# Patient Record
Sex: Male | Born: 1980 | Race: White | Hispanic: No | Marital: Married | Smoking: Former smoker
Health system: Southern US, Community
[De-identification: ages and names within clinical notes are randomized; demographics above are authoritative.]

## PROBLEM LIST (undated history)

## (undated) DIAGNOSIS — I219 Acute myocardial infarction, unspecified: Secondary | ICD-10-CM

## (undated) DIAGNOSIS — I509 Heart failure, unspecified: Secondary | ICD-10-CM

## (undated) DIAGNOSIS — J189 Pneumonia, unspecified organism: Secondary | ICD-10-CM

## (undated) DIAGNOSIS — C50919 Malignant neoplasm of unspecified site of unspecified female breast: Secondary | ICD-10-CM

---

## 1998-02-10 ENCOUNTER — Ambulatory Visit (HOSPITAL_COMMUNITY): Admission: EM | Admit: 1998-02-10 | Discharge: 1998-02-11 | Payer: Self-pay | Admitting: General Surgery

## 1998-02-10 ENCOUNTER — Ambulatory Visit (HOSPITAL_BASED_OUTPATIENT_CLINIC_OR_DEPARTMENT_OTHER): Admission: RE | Admit: 1998-02-10 | Discharge: 1998-02-10 | Payer: Self-pay | Admitting: General Surgery

## 1998-10-22 ENCOUNTER — Ambulatory Visit (HOSPITAL_COMMUNITY): Admission: RE | Admit: 1998-10-22 | Discharge: 1998-10-22 | Payer: Self-pay | Admitting: Neurosurgery

## 1998-10-22 ENCOUNTER — Encounter: Payer: Self-pay | Admitting: Neurosurgery

## 2015-03-25 ENCOUNTER — Ambulatory Visit: Payer: Self-pay | Admitting: Podiatry

## 2016-10-04 DIAGNOSIS — M25372 Other instability, left ankle: Secondary | ICD-10-CM | POA: Diagnosis not present

## 2017-03-29 DIAGNOSIS — R030 Elevated blood-pressure reading, without diagnosis of hypertension: Secondary | ICD-10-CM | POA: Diagnosis not present

## 2017-03-29 DIAGNOSIS — J029 Acute pharyngitis, unspecified: Secondary | ICD-10-CM | POA: Diagnosis not present

## 2017-03-29 DIAGNOSIS — J069 Acute upper respiratory infection, unspecified: Secondary | ICD-10-CM | POA: Diagnosis not present

## 2017-08-21 DIAGNOSIS — R05 Cough: Secondary | ICD-10-CM | POA: Diagnosis not present

## 2017-08-21 DIAGNOSIS — R6889 Other general symptoms and signs: Secondary | ICD-10-CM | POA: Diagnosis not present

## 2017-08-21 DIAGNOSIS — R51 Headache: Secondary | ICD-10-CM | POA: Diagnosis not present

## 2017-08-21 DIAGNOSIS — J329 Chronic sinusitis, unspecified: Secondary | ICD-10-CM | POA: Diagnosis not present

## 2018-01-20 DIAGNOSIS — R030 Elevated blood-pressure reading, without diagnosis of hypertension: Secondary | ICD-10-CM | POA: Diagnosis not present

## 2018-01-20 DIAGNOSIS — J020 Streptococcal pharyngitis: Secondary | ICD-10-CM | POA: Diagnosis not present

## 2018-01-21 DIAGNOSIS — F902 Attention-deficit hyperactivity disorder, combined type: Secondary | ICD-10-CM | POA: Diagnosis not present

## 2018-01-21 DIAGNOSIS — Z79891 Long term (current) use of opiate analgesic: Secondary | ICD-10-CM | POA: Diagnosis not present

## 2018-02-11 DIAGNOSIS — F902 Attention-deficit hyperactivity disorder, combined type: Secondary | ICD-10-CM | POA: Diagnosis not present

## 2019-10-29 DIAGNOSIS — F4323 Adjustment disorder with mixed anxiety and depressed mood: Secondary | ICD-10-CM | POA: Diagnosis not present

## 2019-11-02 DIAGNOSIS — F4323 Adjustment disorder with mixed anxiety and depressed mood: Secondary | ICD-10-CM | POA: Diagnosis not present

## 2019-11-11 DIAGNOSIS — F4323 Adjustment disorder with mixed anxiety and depressed mood: Secondary | ICD-10-CM | POA: Diagnosis not present

## 2019-11-24 DIAGNOSIS — F4323 Adjustment disorder with mixed anxiety and depressed mood: Secondary | ICD-10-CM | POA: Diagnosis not present

## 2019-12-09 DIAGNOSIS — F4323 Adjustment disorder with mixed anxiety and depressed mood: Secondary | ICD-10-CM | POA: Diagnosis not present

## 2019-12-22 DIAGNOSIS — F4323 Adjustment disorder with mixed anxiety and depressed mood: Secondary | ICD-10-CM | POA: Diagnosis not present

## 2019-12-23 DIAGNOSIS — S61211A Laceration without foreign body of left index finger without damage to nail, initial encounter: Secondary | ICD-10-CM | POA: Diagnosis not present

## 2019-12-30 DIAGNOSIS — F4323 Adjustment disorder with mixed anxiety and depressed mood: Secondary | ICD-10-CM | POA: Diagnosis not present

## 2020-01-06 DIAGNOSIS — F902 Attention-deficit hyperactivity disorder, combined type: Secondary | ICD-10-CM | POA: Diagnosis not present

## 2020-01-06 DIAGNOSIS — F4323 Adjustment disorder with mixed anxiety and depressed mood: Secondary | ICD-10-CM | POA: Diagnosis not present

## 2020-01-21 DIAGNOSIS — F4323 Adjustment disorder with mixed anxiety and depressed mood: Secondary | ICD-10-CM | POA: Diagnosis not present

## 2020-02-03 DIAGNOSIS — F4323 Adjustment disorder with mixed anxiety and depressed mood: Secondary | ICD-10-CM | POA: Diagnosis not present

## 2020-02-15 DIAGNOSIS — F4323 Adjustment disorder with mixed anxiety and depressed mood: Secondary | ICD-10-CM | POA: Diagnosis not present

## 2020-03-02 DIAGNOSIS — F4323 Adjustment disorder with mixed anxiety and depressed mood: Secondary | ICD-10-CM | POA: Diagnosis not present

## 2020-03-18 DIAGNOSIS — F4323 Adjustment disorder with mixed anxiety and depressed mood: Secondary | ICD-10-CM | POA: Diagnosis not present

## 2020-03-30 DIAGNOSIS — F4323 Adjustment disorder with mixed anxiety and depressed mood: Secondary | ICD-10-CM | POA: Diagnosis not present

## 2020-04-13 DIAGNOSIS — F4323 Adjustment disorder with mixed anxiety and depressed mood: Secondary | ICD-10-CM | POA: Diagnosis not present

## 2020-04-27 DIAGNOSIS — F4323 Adjustment disorder with mixed anxiety and depressed mood: Secondary | ICD-10-CM | POA: Diagnosis not present

## 2020-05-12 DIAGNOSIS — F4323 Adjustment disorder with mixed anxiety and depressed mood: Secondary | ICD-10-CM | POA: Diagnosis not present

## 2020-05-25 DIAGNOSIS — F4323 Adjustment disorder with mixed anxiety and depressed mood: Secondary | ICD-10-CM | POA: Diagnosis not present

## 2020-06-22 DIAGNOSIS — F4323 Adjustment disorder with mixed anxiety and depressed mood: Secondary | ICD-10-CM | POA: Diagnosis not present

## 2020-07-04 DIAGNOSIS — F902 Attention-deficit hyperactivity disorder, combined type: Secondary | ICD-10-CM | POA: Diagnosis not present

## 2020-07-12 DIAGNOSIS — F4323 Adjustment disorder with mixed anxiety and depressed mood: Secondary | ICD-10-CM | POA: Diagnosis not present

## 2020-07-26 DIAGNOSIS — F4323 Adjustment disorder with mixed anxiety and depressed mood: Secondary | ICD-10-CM | POA: Diagnosis not present

## 2020-08-09 DIAGNOSIS — F4323 Adjustment disorder with mixed anxiety and depressed mood: Secondary | ICD-10-CM | POA: Diagnosis not present

## 2020-08-23 DIAGNOSIS — F4323 Adjustment disorder with mixed anxiety and depressed mood: Secondary | ICD-10-CM | POA: Diagnosis not present

## 2020-09-06 DIAGNOSIS — F4323 Adjustment disorder with mixed anxiety and depressed mood: Secondary | ICD-10-CM | POA: Diagnosis not present

## 2020-09-19 DIAGNOSIS — F1020 Alcohol dependence, uncomplicated: Secondary | ICD-10-CM | POA: Diagnosis not present

## 2020-09-20 DIAGNOSIS — F4323 Adjustment disorder with mixed anxiety and depressed mood: Secondary | ICD-10-CM | POA: Diagnosis not present

## 2020-10-03 DIAGNOSIS — F1020 Alcohol dependence, uncomplicated: Secondary | ICD-10-CM | POA: Diagnosis not present

## 2020-10-04 DIAGNOSIS — F4323 Adjustment disorder with mixed anxiety and depressed mood: Secondary | ICD-10-CM | POA: Diagnosis not present

## 2020-10-04 DIAGNOSIS — F1020 Alcohol dependence, uncomplicated: Secondary | ICD-10-CM | POA: Diagnosis not present

## 2020-10-05 DIAGNOSIS — F1020 Alcohol dependence, uncomplicated: Secondary | ICD-10-CM | POA: Diagnosis not present

## 2020-10-07 DIAGNOSIS — F1020 Alcohol dependence, uncomplicated: Secondary | ICD-10-CM | POA: Diagnosis not present

## 2020-10-10 DIAGNOSIS — F1020 Alcohol dependence, uncomplicated: Secondary | ICD-10-CM | POA: Diagnosis not present

## 2020-10-12 DIAGNOSIS — F1020 Alcohol dependence, uncomplicated: Secondary | ICD-10-CM | POA: Diagnosis not present

## 2020-10-14 DIAGNOSIS — F1020 Alcohol dependence, uncomplicated: Secondary | ICD-10-CM | POA: Diagnosis not present

## 2020-10-17 DIAGNOSIS — F1020 Alcohol dependence, uncomplicated: Secondary | ICD-10-CM | POA: Diagnosis not present

## 2020-10-18 DIAGNOSIS — F1020 Alcohol dependence, uncomplicated: Secondary | ICD-10-CM | POA: Diagnosis not present

## 2020-10-19 DIAGNOSIS — F4323 Adjustment disorder with mixed anxiety and depressed mood: Secondary | ICD-10-CM | POA: Diagnosis not present

## 2020-10-19 DIAGNOSIS — F1020 Alcohol dependence, uncomplicated: Secondary | ICD-10-CM | POA: Diagnosis not present

## 2020-10-21 DIAGNOSIS — F1020 Alcohol dependence, uncomplicated: Secondary | ICD-10-CM | POA: Diagnosis not present

## 2020-10-24 DIAGNOSIS — F1020 Alcohol dependence, uncomplicated: Secondary | ICD-10-CM | POA: Diagnosis not present

## 2020-10-24 DIAGNOSIS — F1021 Alcohol dependence, in remission: Secondary | ICD-10-CM | POA: Diagnosis not present

## 2020-10-25 DIAGNOSIS — F1020 Alcohol dependence, uncomplicated: Secondary | ICD-10-CM | POA: Diagnosis not present

## 2020-10-26 DIAGNOSIS — F1020 Alcohol dependence, uncomplicated: Secondary | ICD-10-CM | POA: Diagnosis not present

## 2020-10-28 DIAGNOSIS — F1020 Alcohol dependence, uncomplicated: Secondary | ICD-10-CM | POA: Diagnosis not present

## 2020-10-28 DIAGNOSIS — F1021 Alcohol dependence, in remission: Secondary | ICD-10-CM | POA: Diagnosis not present

## 2020-10-31 DIAGNOSIS — F1020 Alcohol dependence, uncomplicated: Secondary | ICD-10-CM | POA: Diagnosis not present

## 2020-11-01 DIAGNOSIS — F1020 Alcohol dependence, uncomplicated: Secondary | ICD-10-CM | POA: Diagnosis not present

## 2020-11-01 DIAGNOSIS — F1021 Alcohol dependence, in remission: Secondary | ICD-10-CM | POA: Diagnosis not present

## 2020-11-02 DIAGNOSIS — F1020 Alcohol dependence, uncomplicated: Secondary | ICD-10-CM | POA: Diagnosis not present

## 2020-11-04 DIAGNOSIS — F1020 Alcohol dependence, uncomplicated: Secondary | ICD-10-CM | POA: Diagnosis not present

## 2020-11-07 DIAGNOSIS — F4323 Adjustment disorder with mixed anxiety and depressed mood: Secondary | ICD-10-CM | POA: Diagnosis not present

## 2020-11-07 DIAGNOSIS — F1020 Alcohol dependence, uncomplicated: Secondary | ICD-10-CM | POA: Diagnosis not present

## 2020-11-08 DIAGNOSIS — F1020 Alcohol dependence, uncomplicated: Secondary | ICD-10-CM | POA: Diagnosis not present

## 2020-11-09 DIAGNOSIS — F1020 Alcohol dependence, uncomplicated: Secondary | ICD-10-CM | POA: Diagnosis not present

## 2020-11-11 DIAGNOSIS — F1020 Alcohol dependence, uncomplicated: Secondary | ICD-10-CM | POA: Diagnosis not present

## 2020-11-14 DIAGNOSIS — F1020 Alcohol dependence, uncomplicated: Secondary | ICD-10-CM | POA: Diagnosis not present

## 2020-11-15 DIAGNOSIS — F1020 Alcohol dependence, uncomplicated: Secondary | ICD-10-CM | POA: Diagnosis not present

## 2020-11-16 DIAGNOSIS — F1020 Alcohol dependence, uncomplicated: Secondary | ICD-10-CM | POA: Diagnosis not present

## 2020-11-18 DIAGNOSIS — F1020 Alcohol dependence, uncomplicated: Secondary | ICD-10-CM | POA: Diagnosis not present

## 2020-11-21 DIAGNOSIS — F1020 Alcohol dependence, uncomplicated: Secondary | ICD-10-CM | POA: Diagnosis not present

## 2020-11-21 DIAGNOSIS — F1021 Alcohol dependence, in remission: Secondary | ICD-10-CM | POA: Diagnosis not present

## 2020-11-22 DIAGNOSIS — F1020 Alcohol dependence, uncomplicated: Secondary | ICD-10-CM | POA: Diagnosis not present

## 2020-11-23 DIAGNOSIS — F1020 Alcohol dependence, uncomplicated: Secondary | ICD-10-CM | POA: Diagnosis not present

## 2020-11-25 DIAGNOSIS — F1020 Alcohol dependence, uncomplicated: Secondary | ICD-10-CM | POA: Diagnosis not present

## 2020-11-25 DIAGNOSIS — F1021 Alcohol dependence, in remission: Secondary | ICD-10-CM | POA: Diagnosis not present

## 2020-11-28 DIAGNOSIS — F1020 Alcohol dependence, uncomplicated: Secondary | ICD-10-CM | POA: Diagnosis not present

## 2020-11-29 DIAGNOSIS — F1020 Alcohol dependence, uncomplicated: Secondary | ICD-10-CM | POA: Diagnosis not present

## 2020-11-30 DIAGNOSIS — F1020 Alcohol dependence, uncomplicated: Secondary | ICD-10-CM | POA: Diagnosis not present

## 2020-11-30 DIAGNOSIS — F1021 Alcohol dependence, in remission: Secondary | ICD-10-CM | POA: Diagnosis not present

## 2020-12-01 DIAGNOSIS — F1020 Alcohol dependence, uncomplicated: Secondary | ICD-10-CM | POA: Diagnosis not present

## 2020-12-02 DIAGNOSIS — F1020 Alcohol dependence, uncomplicated: Secondary | ICD-10-CM | POA: Diagnosis not present

## 2020-12-06 DIAGNOSIS — F1020 Alcohol dependence, uncomplicated: Secondary | ICD-10-CM | POA: Diagnosis not present

## 2020-12-07 DIAGNOSIS — F1021 Alcohol dependence, in remission: Secondary | ICD-10-CM | POA: Diagnosis not present

## 2020-12-07 DIAGNOSIS — F1020 Alcohol dependence, uncomplicated: Secondary | ICD-10-CM | POA: Diagnosis not present

## 2020-12-09 DIAGNOSIS — F1020 Alcohol dependence, uncomplicated: Secondary | ICD-10-CM | POA: Diagnosis not present

## 2020-12-12 DIAGNOSIS — F1020 Alcohol dependence, uncomplicated: Secondary | ICD-10-CM | POA: Diagnosis not present

## 2020-12-12 DIAGNOSIS — F1021 Alcohol dependence, in remission: Secondary | ICD-10-CM | POA: Diagnosis not present

## 2020-12-14 DIAGNOSIS — F1020 Alcohol dependence, uncomplicated: Secondary | ICD-10-CM | POA: Diagnosis not present

## 2020-12-22 DIAGNOSIS — F4323 Adjustment disorder with mixed anxiety and depressed mood: Secondary | ICD-10-CM | POA: Diagnosis not present

## 2021-02-22 DIAGNOSIS — F4323 Adjustment disorder with mixed anxiety and depressed mood: Secondary | ICD-10-CM | POA: Diagnosis not present

## 2021-08-09 DIAGNOSIS — I514 Myocarditis, unspecified: Secondary | ICD-10-CM

## 2021-08-09 DIAGNOSIS — I513 Intracardiac thrombosis, not elsewhere classified: Secondary | ICD-10-CM

## 2021-08-09 HISTORY — DX: Myocarditis, unspecified: I51.4

## 2021-08-09 HISTORY — DX: Intracardiac thrombosis, not elsewhere classified: I51.3

## 2021-08-14 ENCOUNTER — Other Ambulatory Visit: Payer: Self-pay

## 2021-08-14 ENCOUNTER — Encounter: Payer: Self-pay | Admitting: Emergency Medicine

## 2021-08-14 ENCOUNTER — Ambulatory Visit
Admission: EM | Admit: 2021-08-14 | Discharge: 2021-08-14 | Disposition: A | Discharge status: Home / Self Care | Payer: BC Managed Care – PPO | Attending: Internal Medicine | Admitting: Internal Medicine

## 2021-08-14 DIAGNOSIS — R0602 Shortness of breath: Secondary | ICD-10-CM | POA: Diagnosis not present

## 2021-08-14 DIAGNOSIS — J069 Acute upper respiratory infection, unspecified: Secondary | ICD-10-CM

## 2021-08-14 DIAGNOSIS — R051 Acute cough: Secondary | ICD-10-CM | POA: Diagnosis not present

## 2021-08-14 MED ORDER — ALBUTEROL SULFATE HFA 108 (90 BASE) MCG/ACT IN AERS: 1.0000 | INHALATION_SPRAY | Freq: Four times a day (QID) | RESPIRATORY_TRACT | 0 refills | Status: DC | PRN

## 2021-08-14 MED ORDER — AZITHROMYCIN 500 MG PO TABS: ORAL_TABLET | ORAL | 0 refills | Status: AC

## 2021-08-14 MED ORDER — BENZONATATE 100 MG PO CAPS: 100.0000 mg | ORAL_CAPSULE | Freq: Three times a day (TID) | ORAL | 0 refills | Status: DC | PRN

## 2021-08-14 MED ORDER — AMOXICILLIN 500 MG PO CAPS: 1000.0000 mg | ORAL_CAPSULE | Freq: Three times a day (TID) | ORAL | 0 refills | Status: AC

## 2021-08-14 NOTE — ED Notes (Signed)
Patient's mother states that patient will probably not go and get his CXR done.  Order still sent with patient in case he changed his mind.

## 2021-08-14 NOTE — Discharge Instructions (Signed)
You have been prescribed 2 antibiotics, cough medication, albuterol inhaler.  A chest x-ray has been ordered for you.  Please go to [facility] imaging on [address] to have this completed.  We will call with results.  Go to the hospital if symptoms persist or worsen.

## 2021-08-14 NOTE — ED Provider Notes (Addendum)
[facility]    CSN: [id number] Arrival date & time: 08/14/21  0854      History   Chief Complaint Chief Complaint  Patient presents with   Chest Congestion    HPI Michael Stuart is a 41 y.o. male.   Patient presents with chest congestion, nasal congestion, nonproductive cough, shortness of breath, fever that has been present for approximately 1 week.  Denies any known sick contacts.  Patient unsure of Tmax at home but states that he "felt feverish".  Shortness of breath is intermittent per patient.  Patient denies history of asthma or COPD but he does smoke.  Patient reports that he has a history of pneumonia and states that this "feels similar".  Patient has taken DayQuil and NyQuil with no relief in symptoms.    History reviewed. No pertinent past medical history.  There are no problems to display for this patient.   History reviewed. No pertinent surgical history.     Home Medications    Prior to Admission medications   Medication Sig Start Date End Date Taking? Authorizing Provider  albuterol (VENTOLIN HFA) 108 (90 Base) MCG/ACT inhaler Inhale 1-2 puffs into the lungs every 6 (six) hours as needed for wheezing or shortness of breath. 08/14/21  Yes Shakiyla Kook, Hildred Alamin E, FNP  amoxicillin (AMOXIL) 500 MG capsule Take 2 capsules (1,000 mg total) by mouth 3 (three) times daily for 5 days. 08/14/21 08/19/21 Yes Meckenzie Balsley, Michele Rockers, FNP  azithromycin (ZITHROMAX) 500 MG tablet Take 1 tablet (500 mg total) by mouth daily for 1 day, THEN 0.5 tablets (250 mg total) daily for 4 days. 08/14/21 08/19/21 Yes Breda Bond, Michele Rockers, FNP  benzonatate (TESSALON) 100 MG capsule Take 1 capsule (100 mg total) by mouth every 8 (eight) hours as needed for cough. 08/14/21  Yes Rahaf Carbonell, Michele Rockers, FNP    Family History Family History  Problem Relation Age of Onset   Healthy Mother     Social History Social History   Tobacco Use   Smoking status: Never   Smokeless tobacco: Current  Vaping Use   Vaping  Use: Every day  Substance Use Topics   Alcohol use: Never   Drug use: Never     Allergies   Bactrim [sulfamethoxazole-trimethoprim]   Review of Systems Review of Systems Per HPI  Physical Exam Triage Vital Signs ED Triage Vitals  Enc Vitals Group     BP 08/14/21 0945 112/82     Pulse Rate 08/14/21 0945 (!) 127     Resp 08/14/21 0945 20     Temp 08/14/21 0945 99.3 F (37.4 C)     Temp Source 08/14/21 0945 Oral     SpO2 08/14/21 0945 93 %     Weight 08/14/21 0946 260 lb (117.9 kg)     Height 08/14/21 0946 6\' 3"  (1.905 m)     Head Circumference --      Peak Flow --      Pain Score 08/14/21 0946 7     Pain Loc --      Pain Edu? --      Excl. in [place]? --    No data found.  Updated Vital Signs BP 112/82 (BP Location: Left Arm)    Pulse (!) 127    Temp 99.3 F (37.4 C) (Oral)    Resp 20    Ht 6\' 3"  (1.905 m)    Wt 260 lb (117.9 kg)    SpO2 95%    BMI 32.50 kg/m  Visual Acuity Right Eye Distance:   Left Eye Distance:   Bilateral Distance:    Right Eye Near:   Left Eye Near:    Bilateral Near:     Physical Exam Constitutional:      General: He is not in acute distress.    Appearance: Normal appearance. He is not toxic-appearing or diaphoretic.  HENT:     Head: Normocephalic and atraumatic.     Right Ear: Tympanic membrane and ear canal normal.     Left Ear: Tympanic membrane and ear canal normal.     Nose: Congestion present.     Mouth/Throat:     Mouth: Mucous membranes are moist.     Pharynx: No posterior oropharyngeal erythema.  Eyes:     Extraocular Movements: Extraocular movements intact.     Conjunctiva/sclera: Conjunctivae normal.     Pupils: Pupils are equal, round, and reactive to light.  Cardiovascular:     Rate and Rhythm: Normal rate and regular rhythm.     Pulses: Normal pulses.     Heart sounds: Normal heart sounds.  Pulmonary:     Effort: Pulmonary effort is normal. No respiratory distress.     Breath sounds: Normal breath sounds. No  stridor. No wheezing, rhonchi or rales.  Abdominal:     General: Abdomen is flat. Bowel sounds are normal.     Palpations: Abdomen is soft.  Musculoskeletal:        General: Normal range of motion.     Cervical back: Normal range of motion.  Skin:    General: Skin is warm and dry.  Neurological:     General: No focal deficit present.     Mental Status: He is alert and oriented to person, place, and time. Mental status is at baseline.  Psychiatric:        Mood and Affect: Mood normal.        Behavior: Behavior normal.     UC Treatments / Results  Labs (all labs ordered are listed, but only abnormal results are displayed) Labs Reviewed  NOVEL CORONAVIRUS, NAA    EKG   Radiology No results found.  Procedures Procedures (including critical care time)  Medications Ordered in UC Medications - No data to display  Initial Impression / Assessment and Plan / UC Course  I have reviewed the triage vital signs and the nursing notes.  Pertinent labs & imaging results that were available during my care of the patient were reviewed by me and considered in my medical decision making (see chart for details).     Differential diagnoses include viral respiratory infection, bronchitis, community-acquired pneumonia.  There is suspicion for community-acquired pneumonia given patient's physical exam and history.  Do not have x-ray tech in urgent care today.  Outpatient imaging ordered at [place].  Awaiting result.  Will opt to treat empirically for community-acquired pneumonia with amoxicillin and azithromycin.  Albuterol inhaler prescribed to take as needed for shortness of breath as well.  Fever monitoring and management discussed with patient.  Benzonatate to take as needed for cough.  Do not think the patient is in need of immediate medical attention in the hospital at this time.  Discussed strict return and ER precautions.  Patient verbalized understanding and was agreeable with  plan. Final Clinical Impressions(s) / UC Diagnoses   Final diagnoses:  Acute upper respiratory infection  Acute cough  Shortness of breath     Discharge Instructions      You have been prescribed 2  antibiotics, cough medication, albuterol inhaler.  A chest x-ray has been ordered for you.  Please go to [facility] imaging on [address] to have this completed.  We will call with results.  Go to the hospital if symptoms persist or worsen.    ED Prescriptions     Medication Sig Dispense Auth. Provider   amoxicillin (AMOXIL) 500 MG capsule Take 2 capsules (1,000 mg total) by mouth 3 (three) times daily for 5 days. 30 capsule [place], [place] E, [place]   azithromycin (ZITHROMAX) 500 MG tablet Take 1 tablet (500 mg total) by mouth daily for 1 day, THEN 0.5 tablets (250 mg total) daily for 4 days. 3 tablet [place], [place] E, [place]   benzonatate (TESSALON) 100 MG capsule Take 1 capsule (100 mg total) by mouth every 8 (eight) hours as needed for cough. 21 capsule [place], [place] E, [place]   albuterol (VENTOLIN HFA) 108 (90 Base) MCG/ACT inhaler Inhale 1-2 puffs into the lungs every 6 (six) hours as needed for wheezing or shortness of breath. 1 each [name], [place]      PDMP not reviewed this encounter.   [name], [place] 08/14/21 [address] [place] 08/14/21 1051

## 2021-08-14 NOTE — ED Triage Notes (Signed)
Patient c/o chest congestion, SOB, low grade fever x 1 week.  Patient has been taken Dayquil and Nyquil w/o any relief.

## 2021-08-24 DIAGNOSIS — R0602 Shortness of breath: Secondary | ICD-10-CM | POA: Diagnosis not present

## 2021-08-24 DIAGNOSIS — R051 Acute cough: Secondary | ICD-10-CM | POA: Diagnosis not present

## 2021-08-24 DIAGNOSIS — J0100 Acute maxillary sinusitis, unspecified: Secondary | ICD-10-CM | POA: Diagnosis not present

## 2021-08-24 DIAGNOSIS — J189 Pneumonia, unspecified organism: Secondary | ICD-10-CM | POA: Diagnosis not present

## 2021-08-27 ENCOUNTER — Inpatient Hospital Stay (HOSPITAL_COMMUNITY): Payer: BC Managed Care – PPO

## 2021-08-27 ENCOUNTER — Encounter (HOSPITAL_COMMUNITY): Payer: Self-pay | Admitting: Emergency Medicine

## 2021-08-27 ENCOUNTER — Emergency Department (HOSPITAL_COMMUNITY): Payer: BC Managed Care – PPO

## 2021-08-27 ENCOUNTER — Inpatient Hospital Stay (HOSPITAL_COMMUNITY)
Admission: EM | Admit: 2021-08-27 | Discharge: 2021-08-31 | DRG: 286 | Disposition: A | Discharge status: Home / Self Care | Payer: BC Managed Care – PPO | Attending: Cardiology | Admitting: Cardiology

## 2021-08-27 ENCOUNTER — Other Ambulatory Visit: Payer: Self-pay

## 2021-08-27 DIAGNOSIS — I517 Cardiomegaly: Secondary | ICD-10-CM | POA: Diagnosis not present

## 2021-08-27 DIAGNOSIS — I400 Infective myocarditis: Secondary | ICD-10-CM | POA: Diagnosis not present

## 2021-08-27 DIAGNOSIS — J90 Pleural effusion, not elsewhere classified: Secondary | ICD-10-CM | POA: Diagnosis not present

## 2021-08-27 DIAGNOSIS — I5043 Acute on chronic combined systolic (congestive) and diastolic (congestive) heart failure: Principal | ICD-10-CM | POA: Insufficient documentation

## 2021-08-27 DIAGNOSIS — R471 Dysarthria and anarthria: Secondary | ICD-10-CM | POA: Diagnosis not present

## 2021-08-27 DIAGNOSIS — I420 Dilated cardiomyopathy: Secondary | ICD-10-CM | POA: Diagnosis not present

## 2021-08-27 DIAGNOSIS — I409 Acute myocarditis, unspecified: Secondary | ICD-10-CM | POA: Diagnosis not present

## 2021-08-27 DIAGNOSIS — R590 Localized enlarged lymph nodes: Secondary | ICD-10-CM | POA: Diagnosis present

## 2021-08-27 DIAGNOSIS — I5021 Acute systolic (congestive) heart failure: Secondary | ICD-10-CM

## 2021-08-27 DIAGNOSIS — T380X5A Adverse effect of glucocorticoids and synthetic analogues, initial encounter: Secondary | ICD-10-CM | POA: Diagnosis present

## 2021-08-27 DIAGNOSIS — E669 Obesity, unspecified: Secondary | ICD-10-CM | POA: Diagnosis present

## 2021-08-27 DIAGNOSIS — E119 Type 2 diabetes mellitus without complications: Secondary | ICD-10-CM | POA: Diagnosis not present

## 2021-08-27 DIAGNOSIS — C50922 Malignant neoplasm of unspecified site of left male breast: Secondary | ICD-10-CM | POA: Diagnosis present

## 2021-08-27 DIAGNOSIS — H5462 Unqualified visual loss, left eye, normal vision right eye: Secondary | ICD-10-CM | POA: Diagnosis present

## 2021-08-27 DIAGNOSIS — R29818 Other symptoms and signs involving the nervous system: Secondary | ICD-10-CM | POA: Diagnosis not present

## 2021-08-27 DIAGNOSIS — R0602 Shortness of breath: Secondary | ICD-10-CM

## 2021-08-27 DIAGNOSIS — R7989 Other specified abnormal findings of blood chemistry: Secondary | ICD-10-CM

## 2021-08-27 DIAGNOSIS — Z6831 Body mass index (BMI) 31.0-31.9, adult: Secondary | ICD-10-CM

## 2021-08-27 DIAGNOSIS — I514 Myocarditis, unspecified: Secondary | ICD-10-CM | POA: Diagnosis present

## 2021-08-27 DIAGNOSIS — I639 Cerebral infarction, unspecified: Secondary | ICD-10-CM | POA: Diagnosis not present

## 2021-08-27 DIAGNOSIS — R791 Abnormal coagulation profile: Secondary | ICD-10-CM | POA: Diagnosis present

## 2021-08-27 DIAGNOSIS — Z20822 Contact with and (suspected) exposure to covid-19: Secondary | ICD-10-CM | POA: Diagnosis present

## 2021-08-27 DIAGNOSIS — R748 Abnormal levels of other serum enzymes: Secondary | ICD-10-CM

## 2021-08-27 DIAGNOSIS — F064 Anxiety disorder due to known physiological condition: Secondary | ICD-10-CM | POA: Diagnosis present

## 2021-08-27 DIAGNOSIS — I5022 Chronic systolic (congestive) heart failure: Secondary | ICD-10-CM | POA: Diagnosis not present

## 2021-08-27 DIAGNOSIS — G4489 Other headache syndrome: Secondary | ICD-10-CM | POA: Diagnosis not present

## 2021-08-27 DIAGNOSIS — J320 Chronic maxillary sinusitis: Secondary | ICD-10-CM | POA: Diagnosis not present

## 2021-08-27 DIAGNOSIS — F17210 Nicotine dependence, cigarettes, uncomplicated: Secondary | ICD-10-CM | POA: Diagnosis present

## 2021-08-27 DIAGNOSIS — Z79899 Other long term (current) drug therapy: Secondary | ICD-10-CM

## 2021-08-27 DIAGNOSIS — R778 Other specified abnormalities of plasma proteins: Secondary | ICD-10-CM | POA: Diagnosis not present

## 2021-08-27 DIAGNOSIS — Z716 Tobacco abuse counseling: Secondary | ICD-10-CM

## 2021-08-27 DIAGNOSIS — E7800 Pure hypercholesterolemia, unspecified: Secondary | ICD-10-CM | POA: Diagnosis not present

## 2021-08-27 DIAGNOSIS — I3139 Other pericardial effusion (noninflammatory): Secondary | ICD-10-CM | POA: Diagnosis not present

## 2021-08-27 DIAGNOSIS — R202 Paresthesia of skin: Secondary | ICD-10-CM | POA: Diagnosis not present

## 2021-08-27 DIAGNOSIS — I5041 Acute combined systolic (congestive) and diastolic (congestive) heart failure: Secondary | ICD-10-CM | POA: Diagnosis not present

## 2021-08-27 DIAGNOSIS — H538 Other visual disturbances: Secondary | ICD-10-CM | POA: Diagnosis not present

## 2021-08-27 DIAGNOSIS — I248 Other forms of acute ischemic heart disease: Secondary | ICD-10-CM | POA: Diagnosis present

## 2021-08-27 DIAGNOSIS — N6453 Retraction of nipple: Secondary | ICD-10-CM | POA: Diagnosis present

## 2021-08-27 DIAGNOSIS — I513 Intracardiac thrombosis, not elsewhere classified: Secondary | ICD-10-CM | POA: Diagnosis not present

## 2021-08-27 DIAGNOSIS — I6340 Cerebral infarction due to embolism of unspecified cerebral artery: Secondary | ICD-10-CM | POA: Diagnosis not present

## 2021-08-27 DIAGNOSIS — Z8249 Family history of ischemic heart disease and other diseases of the circulatory system: Secondary | ICD-10-CM

## 2021-08-27 DIAGNOSIS — R945 Abnormal results of liver function studies: Secondary | ICD-10-CM | POA: Diagnosis not present

## 2021-08-27 DIAGNOSIS — N6320 Unspecified lump in the left breast, unspecified quadrant: Secondary | ICD-10-CM | POA: Diagnosis not present

## 2021-08-27 DIAGNOSIS — R29810 Facial weakness: Secondary | ICD-10-CM | POA: Diagnosis present

## 2021-08-27 DIAGNOSIS — I428 Other cardiomyopathies: Secondary | ICD-10-CM | POA: Diagnosis not present

## 2021-08-27 DIAGNOSIS — I110 Hypertensive heart disease with heart failure: Secondary | ICD-10-CM | POA: Diagnosis present

## 2021-08-27 DIAGNOSIS — I509 Heart failure, unspecified: Secondary | ICD-10-CM

## 2021-08-27 DIAGNOSIS — Z7901 Long term (current) use of anticoagulants: Secondary | ICD-10-CM

## 2021-08-27 DIAGNOSIS — E785 Hyperlipidemia, unspecified: Secondary | ICD-10-CM | POA: Diagnosis present

## 2021-08-27 DIAGNOSIS — Z720 Tobacco use: Secondary | ICD-10-CM | POA: Diagnosis present

## 2021-08-27 DIAGNOSIS — R55 Syncope and collapse: Secondary | ICD-10-CM | POA: Diagnosis not present

## 2021-08-27 DIAGNOSIS — Z882 Allergy status to sulfonamides status: Secondary | ICD-10-CM

## 2021-08-27 DIAGNOSIS — R000 Tachycardia, unspecified: Secondary | ICD-10-CM

## 2021-08-27 DIAGNOSIS — R0689 Other abnormalities of breathing: Secondary | ICD-10-CM | POA: Diagnosis not present

## 2021-08-27 DIAGNOSIS — D72829 Elevated white blood cell count, unspecified: Secondary | ICD-10-CM

## 2021-08-27 DIAGNOSIS — I214 Non-ST elevation (NSTEMI) myocardial infarction: Secondary | ICD-10-CM | POA: Diagnosis not present

## 2021-08-27 DIAGNOSIS — I5020 Unspecified systolic (congestive) heart failure: Secondary | ICD-10-CM | POA: Diagnosis not present

## 2021-08-27 DIAGNOSIS — R200 Anesthesia of skin: Secondary | ICD-10-CM | POA: Diagnosis not present

## 2021-08-27 HISTORY — DX: Heart failure, unspecified: I50.9

## 2021-08-27 LAB — RAPID URINE DRUG SCREEN, HOSP PERFORMED
Amphetamines: NOT DETECTED
Barbiturates: NOT DETECTED
Benzodiazepines: NOT DETECTED
Cocaine: NOT DETECTED
Opiates: NOT DETECTED
Tetrahydrocannabinol: NOT DETECTED

## 2021-08-27 LAB — COMPREHENSIVE METABOLIC PANEL
ALT: 61 U/L — ABNORMAL HIGH (ref 0–44)
AST: 44 U/L — ABNORMAL HIGH (ref 15–41)
Albumin: 3.4 g/dL — ABNORMAL LOW (ref 3.5–5.0)
Alkaline Phosphatase: 47 U/L (ref 38–126)
Anion gap: 10 (ref 5–15)
BUN: 10 mg/dL (ref 6–20)
CO2: 22 mmol/L (ref 22–32)
Calcium: 8.9 mg/dL (ref 8.9–10.3)
Chloride: 105 mmol/L (ref 98–111)
Creatinine, Ser: 1.18 mg/dL (ref 0.61–1.24)
GFR, Estimated: >60 mL/min (ref >60)
Glucose, Bld: 101 mg/dL — ABNORMAL HIGH (ref 70–99)
Potassium: 4.7 mmol/L (ref 3.5–5.1)
Sodium: 137 mmol/L (ref 135–145)
Total Bilirubin: 0.6 mg/dL (ref 0.3–1.2)
Total Protein: 6.3 g/dL — ABNORMAL LOW (ref 6.5–8.1)

## 2021-08-27 LAB — URINALYSIS, ROUTINE W REFLEX MICROSCOPIC
Bilirubin Urine: NEGATIVE
Glucose, UA: NEGATIVE mg/dL
Hgb urine dipstick: NEGATIVE
Ketones, ur: NEGATIVE mg/dL
Leukocytes,Ua: NEGATIVE
Nitrite: NEGATIVE
Protein, ur: NEGATIVE mg/dL
Specific Gravity, Urine: <1.005 — ABNORMAL LOW (ref 1.005–1.030)
pH: 6 (ref 5.0–8.0)

## 2021-08-27 LAB — CBC WITH DIFFERENTIAL/PLATELET
Abs Immature Granulocytes: 0.11 10*3/uL — ABNORMAL HIGH (ref 0.00–0.07)
Basophils Absolute: 0.1 10*3/uL (ref 0.0–0.1)
Basophils Relative: 1 %
Eosinophils Absolute: 0.1 10*3/uL (ref 0.0–0.5)
Eosinophils Relative: 0 %
HCT: 49 % (ref 39.0–52.0)
Hemoglobin: 15.5 g/dL (ref 13.0–17.0)
Immature Granulocytes: 1 %
Lymphocytes Relative: 14 %
Lymphs Abs: 2.1 10*3/uL (ref 0.7–4.0)
MCH: 30.8 pg (ref 26.0–34.0)
MCHC: 31.6 g/dL (ref 30.0–36.0)
MCV: 97.2 fL (ref 80.0–100.0)
Monocytes Absolute: 0.9 10*3/uL (ref 0.1–1.0)
Monocytes Relative: 6 %
Neutro Abs: 11.6 10*3/uL — ABNORMAL HIGH (ref 1.7–7.7)
Neutrophils Relative %: 78 %
Platelets: 367 10*3/uL (ref 150–400)
RBC: 5.04 MIL/uL (ref 4.22–5.81)
RDW: 14.4 % (ref 11.5–15.5)
WBC: 14.9 10*3/uL — ABNORMAL HIGH (ref 4.0–10.5)
nRBC: 0 % (ref 0.0–0.2)

## 2021-08-27 LAB — ECHOCARDIOGRAM COMPLETE
AR max vel: 4.11 cm2
AV Area VTI: 3.48 cm2
AV Area mean vel: 3.48 cm2
AV Mean grad: 1 mmHg
AV Peak grad: 2.2 mmHg
Ao pk vel: 0.74 m/s
Calc EF: 16.1 %
MV VTI: 2.15 cm2
S' Lateral: 5.7 cm
Single Plane A2C EF: 14.5 %
Single Plane A4C EF: 29.1 %

## 2021-08-27 LAB — RESP PANEL BY RT-PCR (FLU A&B, COVID) ARPGX2
Influenza A by PCR: NEGATIVE
Influenza B by PCR: NEGATIVE
SARS Coronavirus 2 by RT PCR: NEGATIVE

## 2021-08-27 LAB — HEPARIN LEVEL (UNFRACTIONATED): Heparin Unfractionated: <0.1 IU/mL — ABNORMAL LOW (ref 0.30–0.70)

## 2021-08-27 LAB — HEPATITIS PANEL, ACUTE
HCV Ab: NONREACTIVE
Hep A IgM: NONREACTIVE
Hep B C IgM: NONREACTIVE
Hepatitis B Surface Ag: NONREACTIVE

## 2021-08-27 LAB — HEMOGLOBIN A1C
Hgb A1c MFr Bld: 5.6 % (ref 4.8–5.6)
Mean Plasma Glucose: 114.02 mg/dL

## 2021-08-27 LAB — HIV ANTIBODY (ROUTINE TESTING W REFLEX): HIV Screen 4th Generation wRfx: NONREACTIVE

## 2021-08-27 LAB — D-DIMER, QUANTITATIVE: D-Dimer, Quant: 0.94 ug/mL-FEU — ABNORMAL HIGH (ref 0.00–0.50)

## 2021-08-27 LAB — TROPONIN I (HIGH SENSITIVITY)
Troponin I (High Sensitivity): 3280 ng/L (ref <18)
Troponin I (High Sensitivity): 1857 ng/L (ref <18)

## 2021-08-27 LAB — TSH: TSH: 0.389 u[IU]/mL (ref 0.350–4.500)

## 2021-08-27 LAB — MAGNESIUM
Magnesium: 1.8 mg/dL (ref 1.7–2.4)
Magnesium: 1.9 mg/dL (ref 1.7–2.4)

## 2021-08-27 LAB — BRAIN NATRIURETIC PEPTIDE: B Natriuretic Peptide: 642 pg/mL — ABNORMAL HIGH (ref 0.0–100.0)

## 2021-08-27 IMAGING — US US ABDOMEN LIMITED
1 series · 14 of 25 positions shown · non-contrast
Comparison: None.

CLINICAL DATA: Elevated LFTs

EXAM:
ULTRASOUND ABDOMEN LIMITED RIGHT UPPER QUADRANT

[Series 1: us abdomen limited ruq (liver/gb) · 14 of 65 slices shown]
[im 1/65]
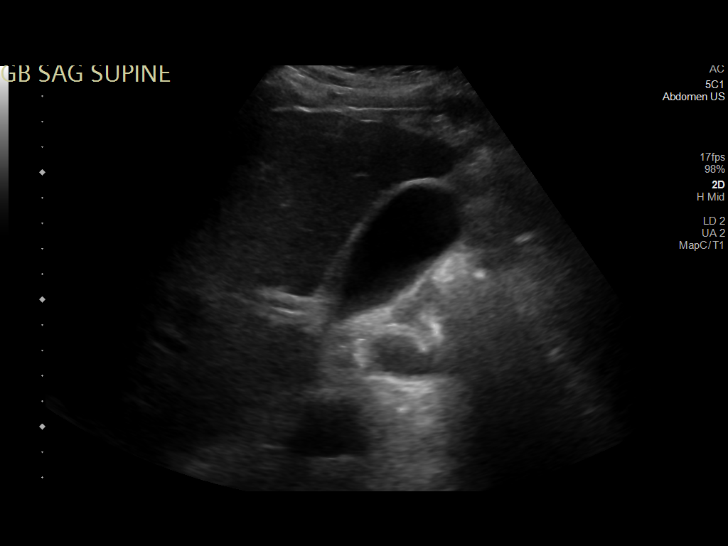
[im 6/65]
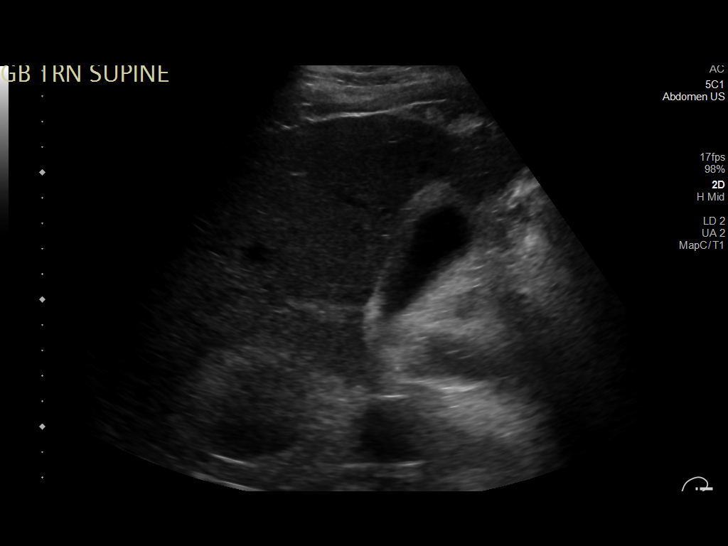
[im 11/65]
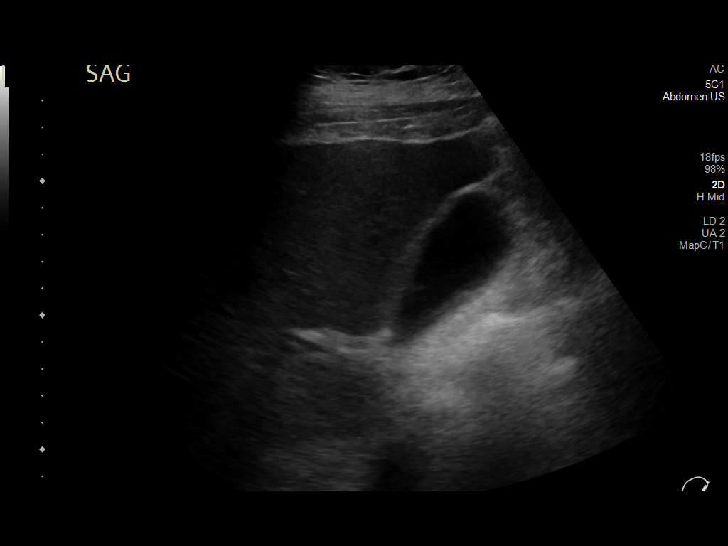
[im 17/65]
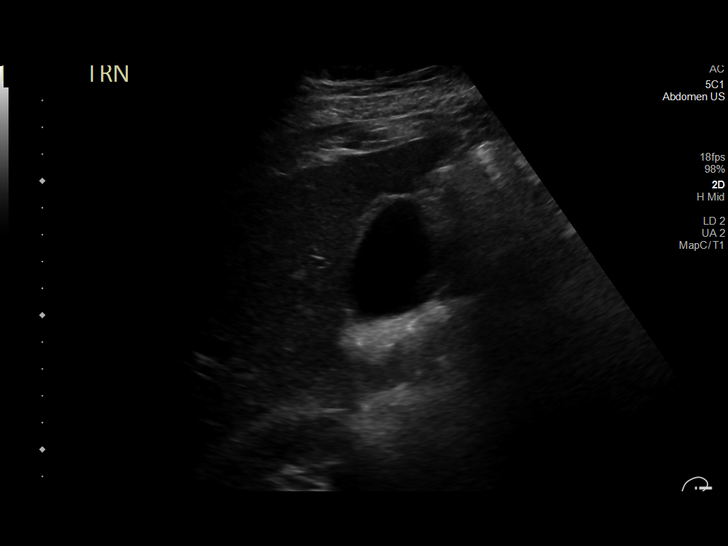
[im 22/65]
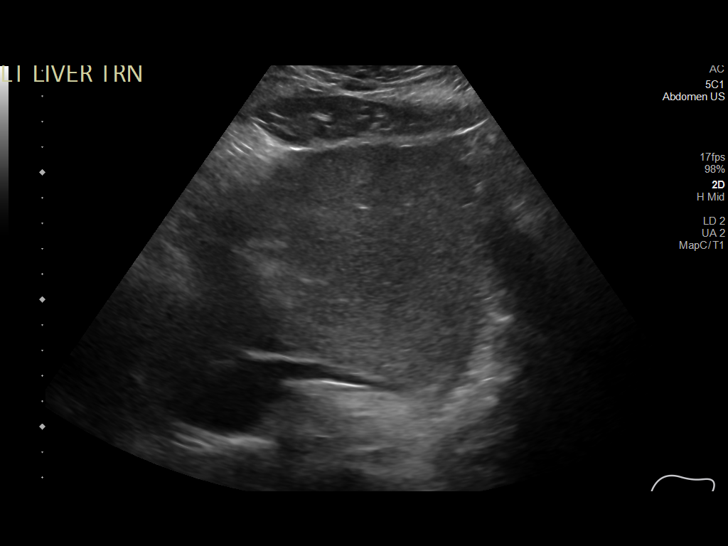
[im 25/65]
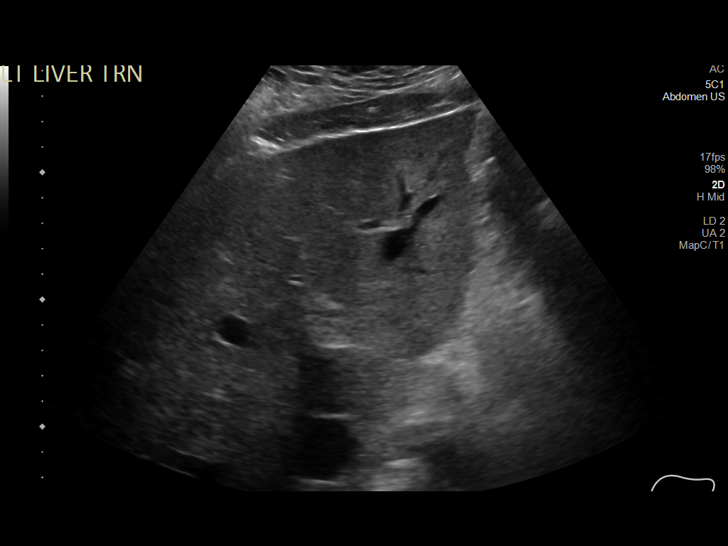
[im 30/65]
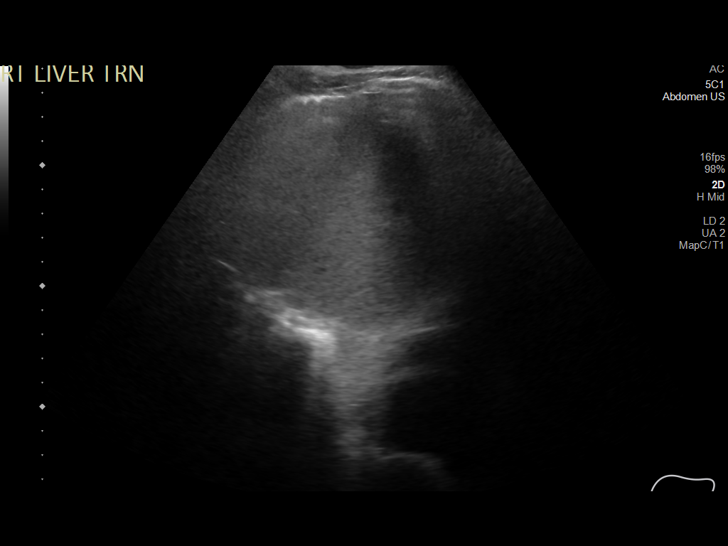
[im 35/65]
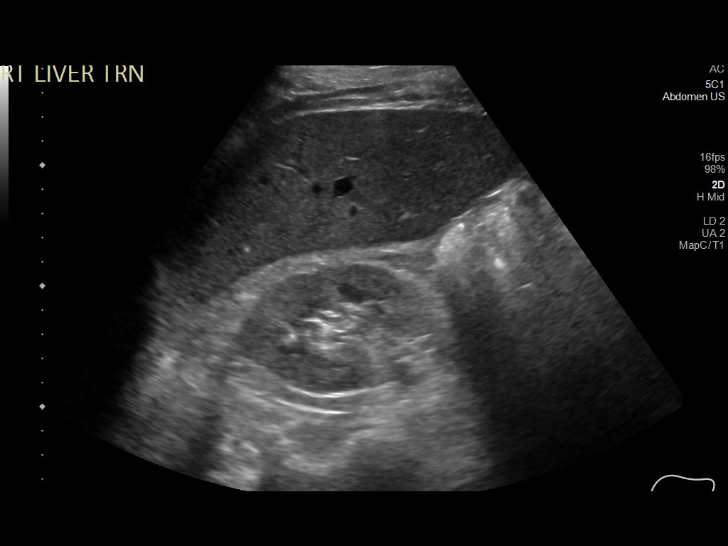
[im 41/65]
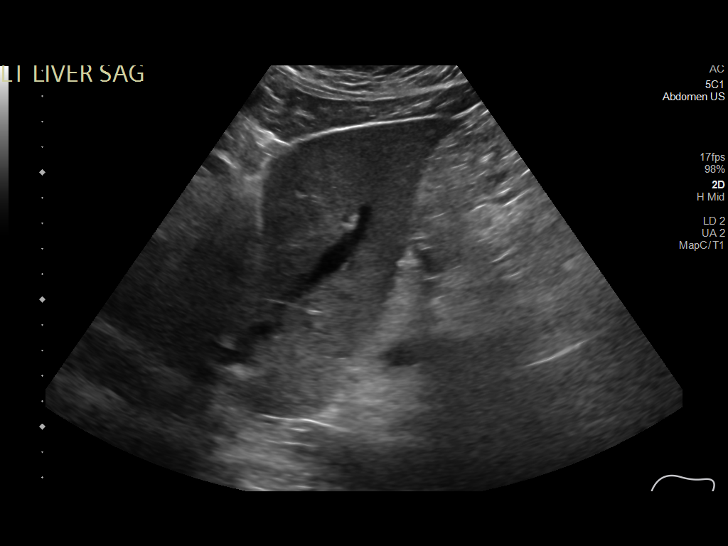
[im 43/65]
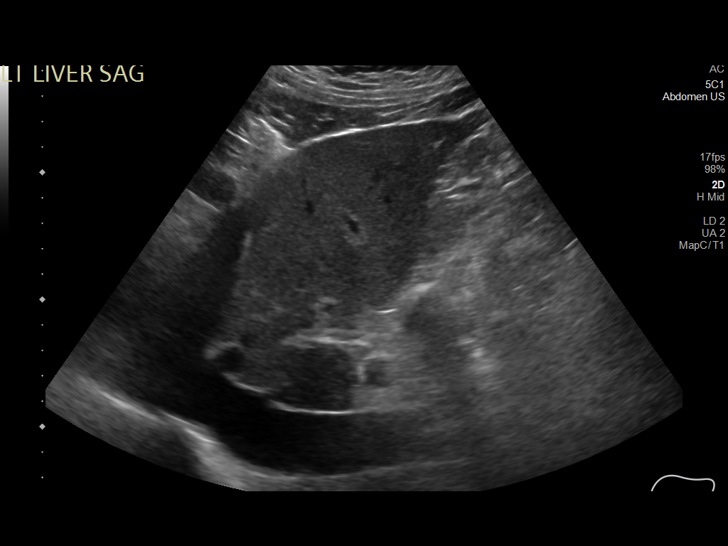
[im 49/65]
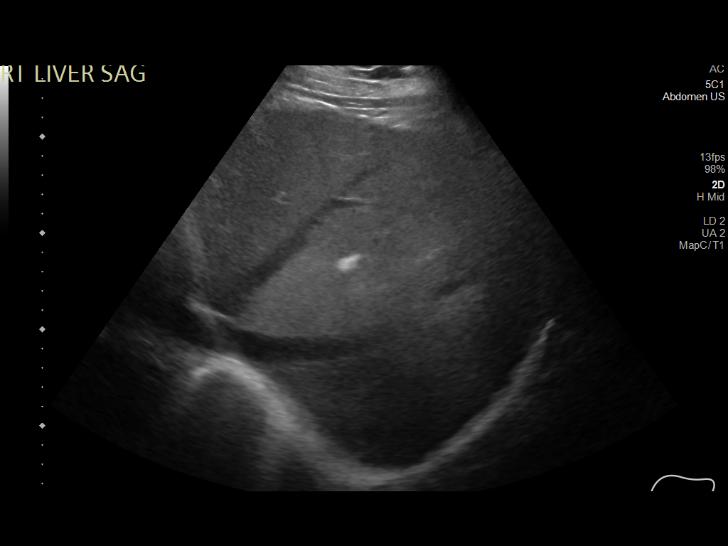
[im 54/65]
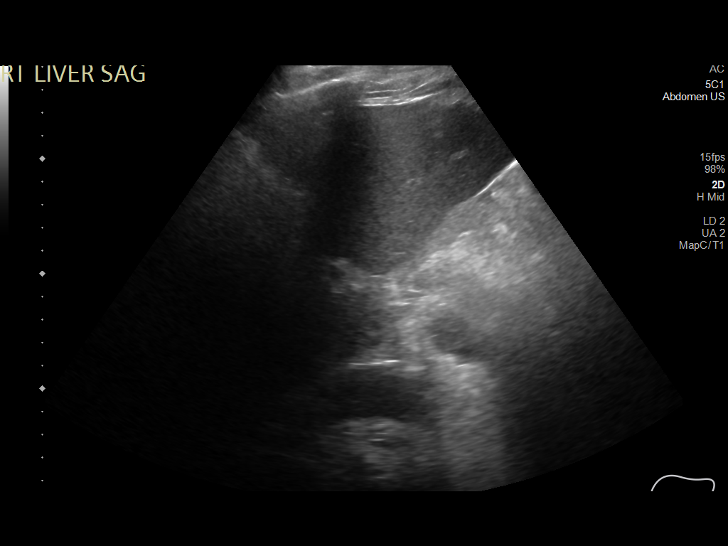
[im 59/65]
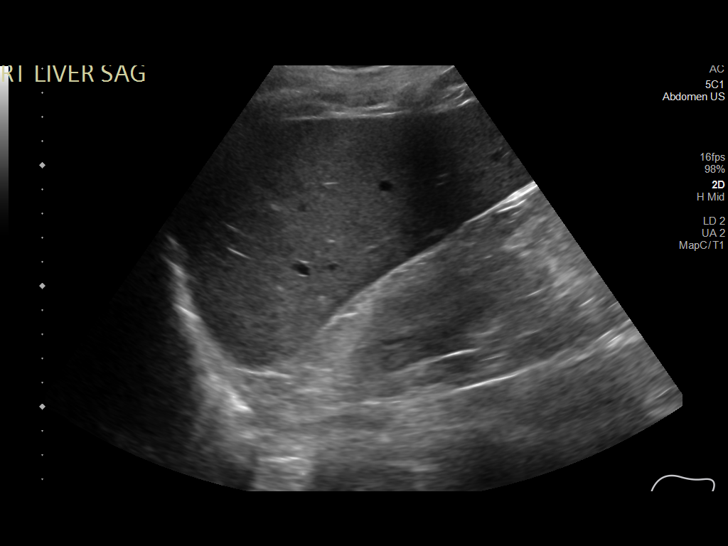
[im 65/65]
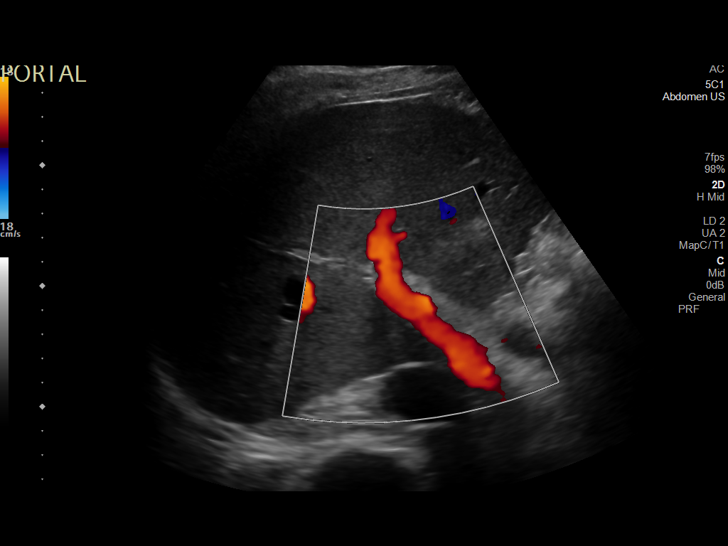

[14 of 25 positions shown; findings below may reference images not displayed]

FINDINGS: Gallbladder:

No gallstones or wall thickening visualized. No sonographic Murphy
sign noted by sonographer.

Common bile duct:

Diameter: 2.5 mm

Liver:

No focal lesion identified. Within normal limits in parenchymal
echogenicity. Portal vein is patent on color Doppler imaging with
normal direction of blood flow towards the liver.

Other: None.
IMPRESSION: Normal right upper quadrant ultrasound.

## 2021-08-27 IMAGING — CT CT ANGIO CHEST
2 of 7 series · 18 of 46 positions shown · IV contrast (APPLIED)
Comparison: None.

CLINICAL DATA: Positive D-dimer, cough

EXAM:
CT ANGIOGRAPHY CHEST WITH CONTRAST
TECHNIQUE: Multidetector CT imaging of the chest was performed using the
standard protocol during bolus administration of intravenous
contrast. Multiplanar CT image reconstructions and MIPs were
obtained to evaluate the vascular anatomy.

[Series 6: thins · axial · 0.95mm/px · z∈[+1293,+1587]mm · 15 of 332 slices shown]
[im 19/332  lung]
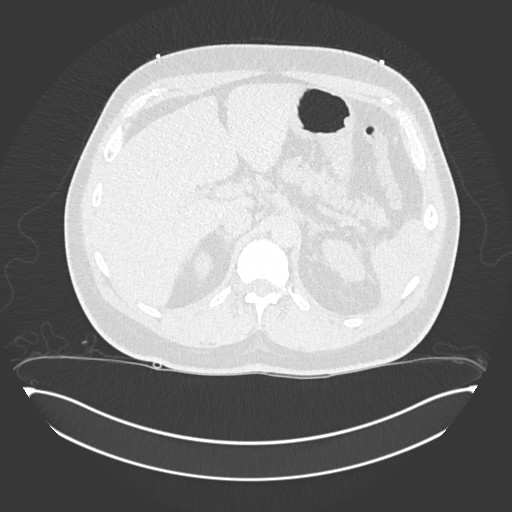
[im 37/332  soft-tissue]
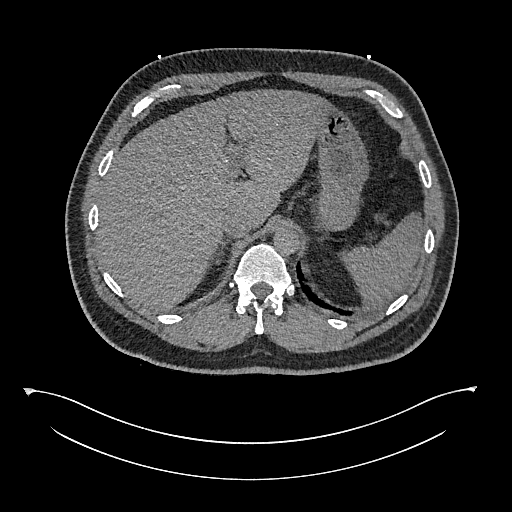
[im 56/332  lung]
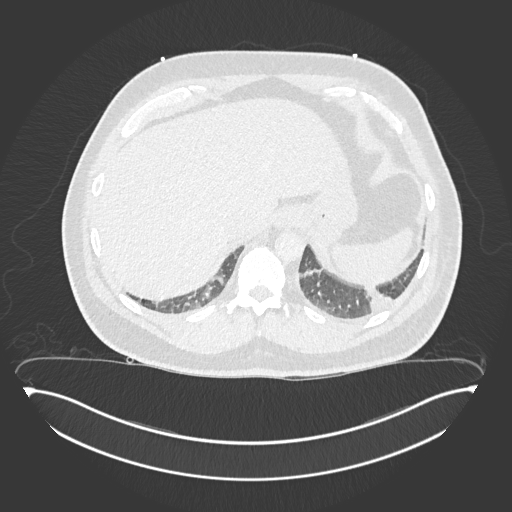
[im 74/332  soft-tissue]
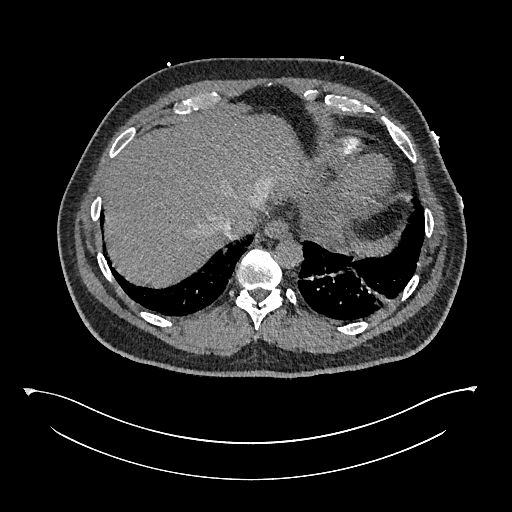
[im 111/332  lung]
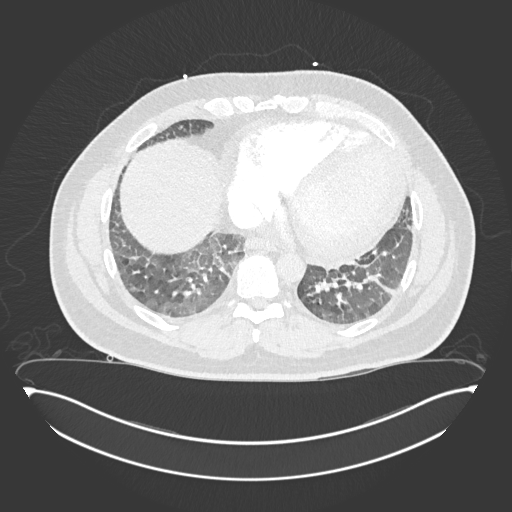
[im 129/332  soft-tissue]
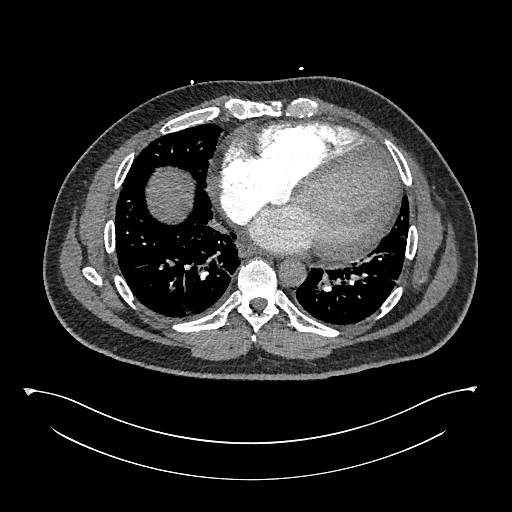
[im 148/332  lung]
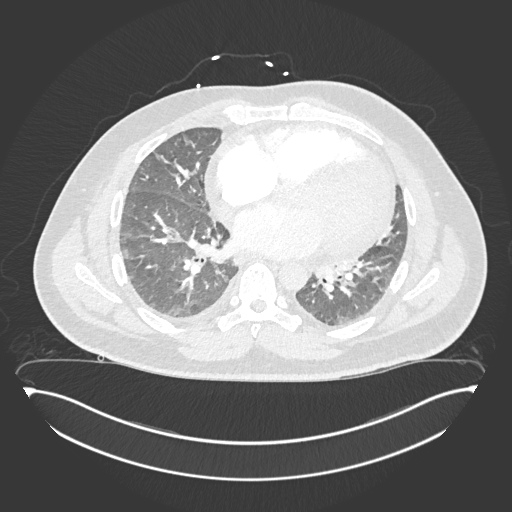
[im 166/332  soft-tissue]
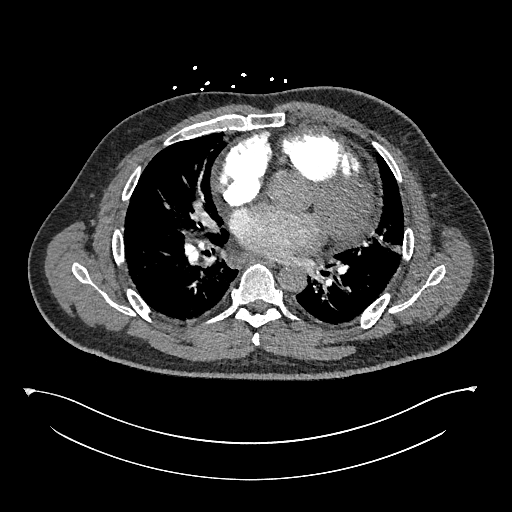
[im 184/332  lung]
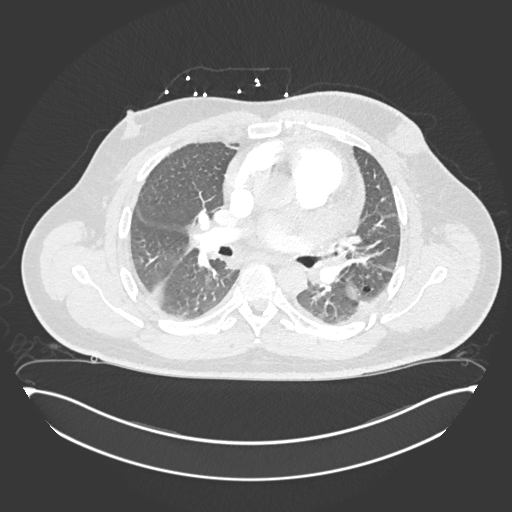
[im 203/332  soft-tissue]
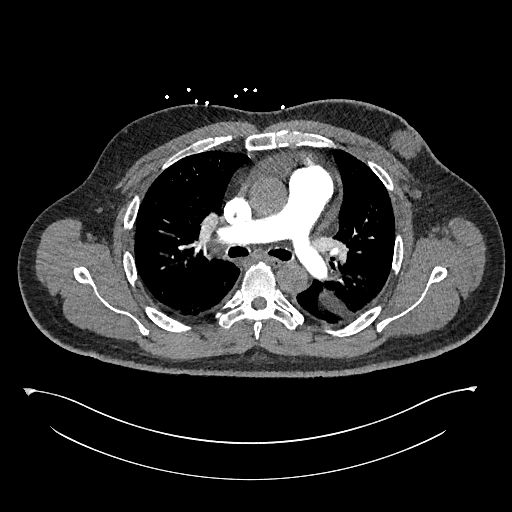
[im 221/332  lung]
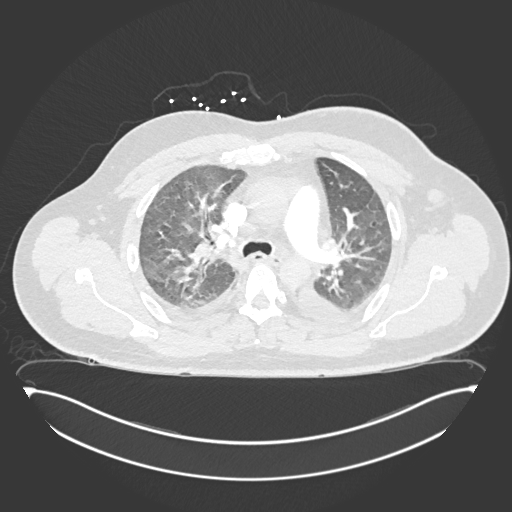
[im 258/332  soft-tissue]
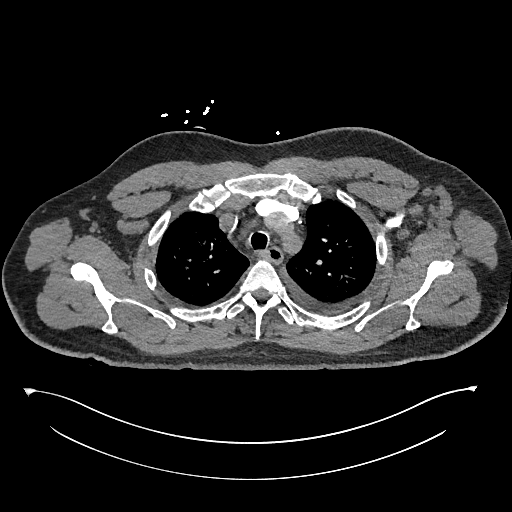
[im 276/332  lung]
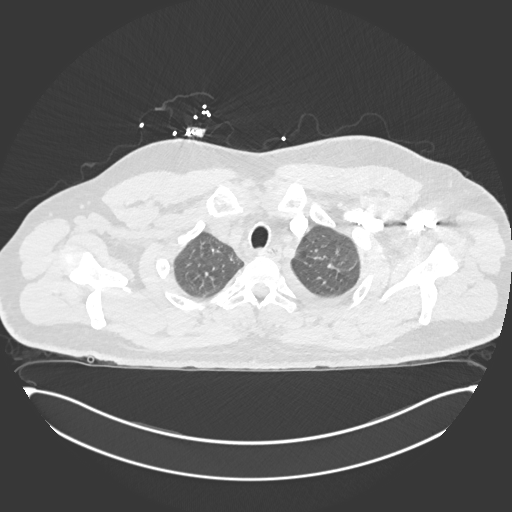
[im 295/332  soft-tissue]
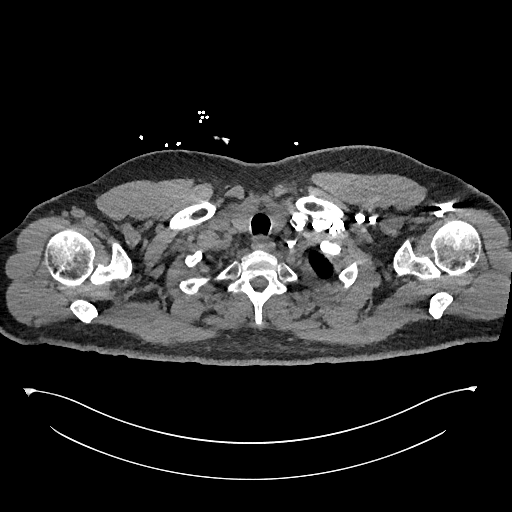
[im 313/332  lung]
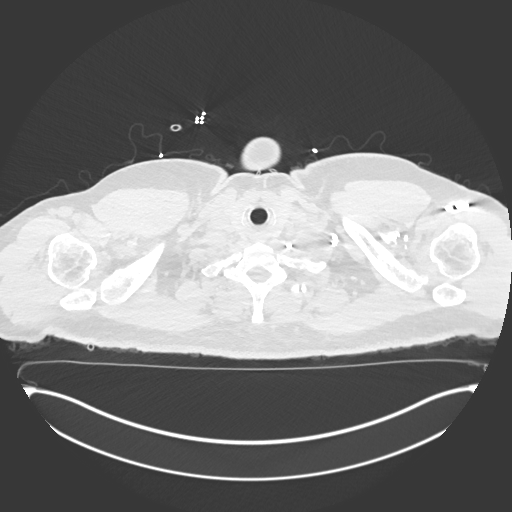

[Series 8: coronal mpr · coronal · 0.70mm/px · 3 of 155 slices shown]
[im 39/155  soft-tissue]
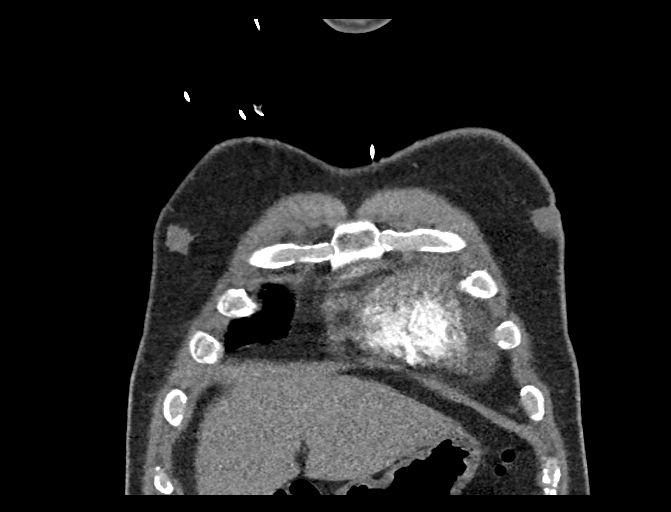
[im 78/155  soft-tissue]
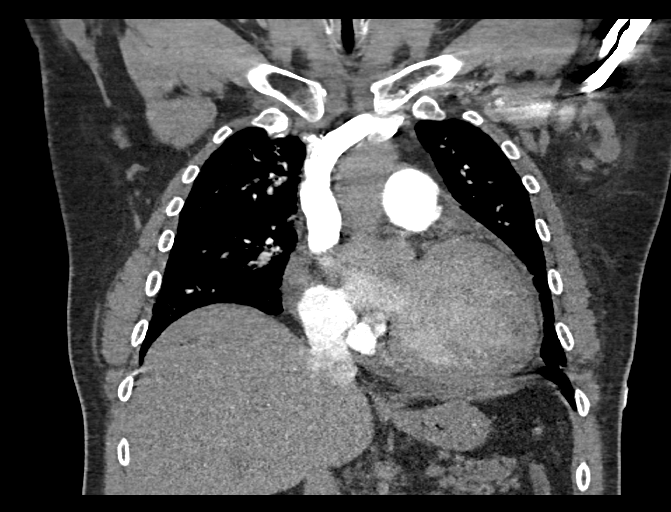
[im 116/155  soft-tissue]
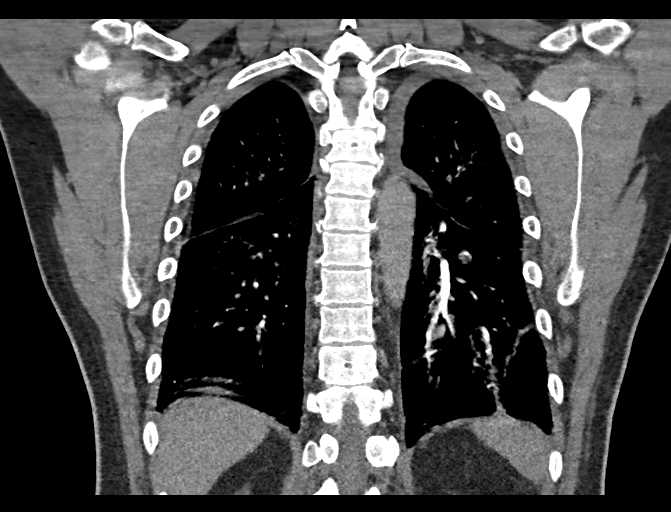

[18 of 46 positions shown; findings below may reference images not displayed]

RADIATION DOSE REDUCTION: This exam was performed according to the
departmental dose-optimization program which includes automated
exposure control, adjustment of the mA and/or kV according to
patient size and/or use of iterative reconstruction technique.

CONTRAST:  75mL OMNIPAQUE IOHEXOL 350 MG/ML SOLN
FINDINGS: Cardiovascular: No pulmonary embolism identified. Main pulmonary
artery is dilated measuring 3.3 cm in diameter. Heart is enlarged.
Small pericardial effusion. Thoracic aorta is normal in course and
caliber.

Mediastinum/Nodes: Bulky lymphadenopathy in the left axilla with the
largest nodes measuring 1.9 x 2 cm and 2.7 x 1.8 cm. Also multiple
enlarged mediastinal and bilateral hilar lymph nodes measuring up to
15 mm in short axis in the mediastinum and hila. There is a solid
retroareolar left breast mass measuring 3.8 x 3.8 x 3.6 cm with
overlying lobulated/nodular skin thickening.

Lungs/Pleura: Breathing motion and subsegmental atelectatic changes
in the lungs. Small left and trace right pleural effusions. No focal
consolidation or pulmonary nodule identified. No pneumothorax.

Upper Abdomen: No acute abnormality.

Musculoskeletal: A few old left rib fractures noted. No suspicious
bony lesions identified.

Review of the MIP images confirms the above findings.
IMPRESSION: 1. 3.8 cm left breast mass as described, highly concerning for
malignancy. Accompanying bulky left axillary lymphadenopathy as well
as mediastinal and hilar adenopathy. Mammography and tissue sampling
recommended.
2. No pulmonary embolism identified.
3. Cardiomegaly and small pericardial effusion.
4. Small left and trace right pleural effusions with associated
atelectatic changes.
5. Mildly dilated pulmonary artery which may represent pulmonary
artery hypertension.

## 2021-08-27 IMAGING — DX DG CHEST 2V
2 series · 2 of 2 positions shown · non-contrast
Comparison: None.

CLINICAL DATA: SOB

EXAM:
CHEST - 2 VIEW

[chest pa]
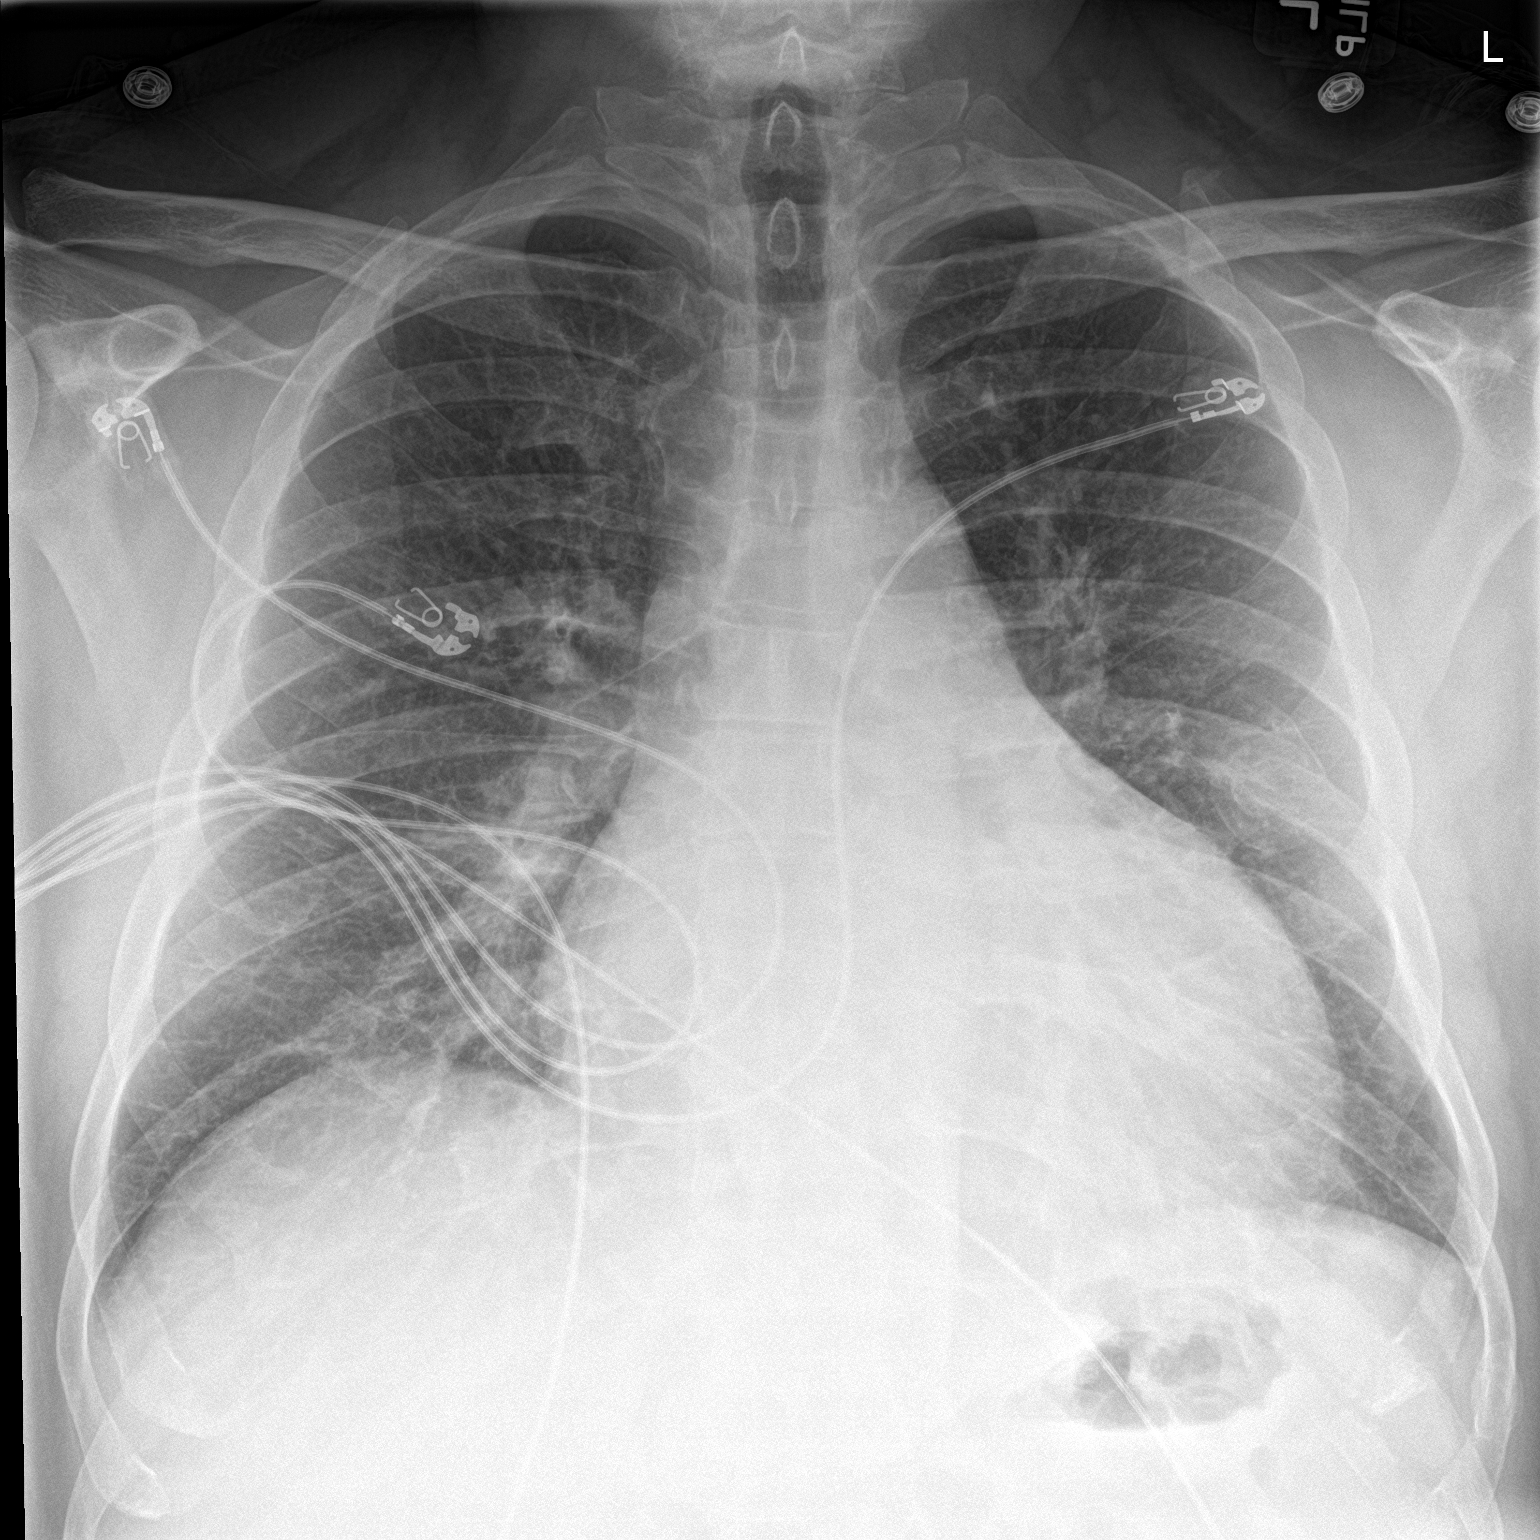

[chest lat]
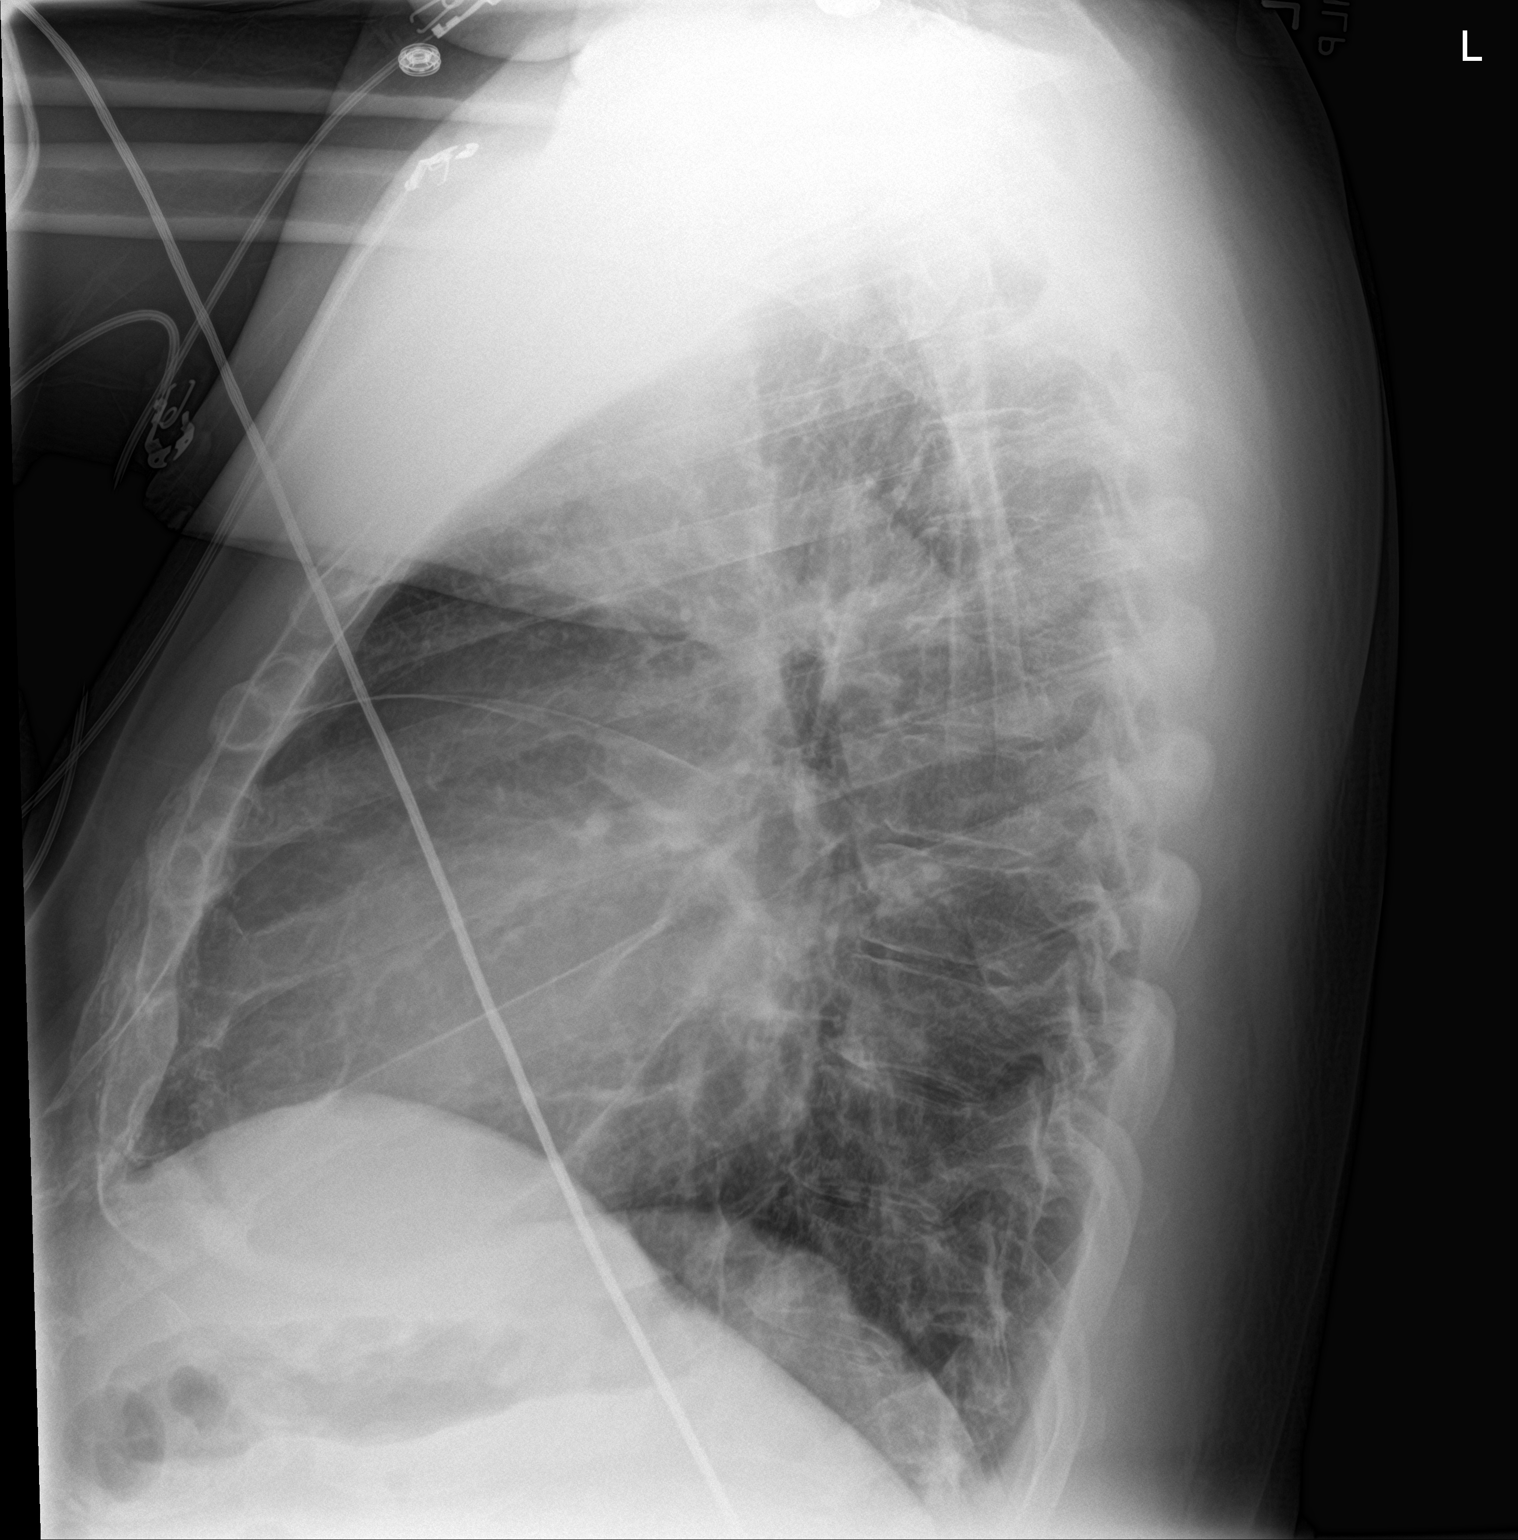

[2 of 2 positions shown; findings below may reference images not displayed]

FINDINGS: Mild interstitial prominence at the lateral lung bases. No confluent
consolidation. No visible pleural effusions or pneumothorax.
Enlarged cardiac silhouette. Multiple suspected remote left rib
fractures.
IMPRESSION: 1. Mild interstitial prominence at the lateral lung bases, which may
represent mild interstitial edema.
2. Cardiomegaly.

## 2021-08-27 MED ORDER — SODIUM CHLORIDE 0.9% FLUSH: 3.0000 mL | INTRAVENOUS | Status: DC | PRN

## 2021-08-27 MED ORDER — ASPIRIN 81 MG PO CHEW
324.0000 mg | CHEWABLE_TABLET | Freq: Once | ORAL | Status: AC
Start: 2021-08-27 — End: 2021-08-27
  Administered 2021-08-27: 324 mg via ORAL
  Filled 2021-08-27: qty 4

## 2021-08-27 MED ORDER — FUROSEMIDE 10 MG/ML IJ SOLN
40.0000 mg | Freq: Two times a day (BID) | INTRAMUSCULAR | Status: DC
  Administered 2021-08-27 – 2021-08-29 (×4): 40 mg via INTRAVENOUS
  Filled 2021-08-27 (×4): qty 4

## 2021-08-27 MED ORDER — SODIUM CHLORIDE 0.9% FLUSH
3.0000 mL | Freq: Two times a day (BID) | INTRAVENOUS | Status: DC
  Administered 2021-08-28 – 2021-08-29 (×3): 3 mL via INTRAVENOUS

## 2021-08-27 MED ORDER — HEPARIN BOLUS VIA INFUSION
2000.0000 [IU] | Freq: Once | INTRAVENOUS | Status: AC
  Administered 2021-08-27: 2000 [IU] via INTRAVENOUS
  Filled 2021-08-27: qty 2000

## 2021-08-27 MED ORDER — FUROSEMIDE 10 MG/ML IJ SOLN
40.0000 mg | Freq: Once | INTRAMUSCULAR | Status: AC
  Administered 2021-08-27: 40 mg via INTRAVENOUS
  Filled 2021-08-27: qty 4

## 2021-08-27 MED ORDER — HEPARIN BOLUS VIA INFUSION
4000.0000 [IU] | Freq: Once | INTRAVENOUS | Status: AC
  Administered 2021-08-27: 4000 [IU] via INTRAVENOUS
  Filled 2021-08-27: qty 4000

## 2021-08-27 MED ORDER — DAPAGLIFLOZIN PROPANEDIOL 10 MG PO TABS
10.0000 mg | ORAL_TABLET | Freq: Every day | ORAL | Status: DC
  Administered 2021-08-28 – 2021-08-31 (×4): 10 mg via ORAL
  Filled 2021-08-27 (×4): qty 1

## 2021-08-27 MED ORDER — BIOTENE DRY MOUTH MT LIQD: 15.0000 mL | OROMUCOSAL | Status: DC | PRN

## 2021-08-27 MED ORDER — SODIUM CHLORIDE 0.9% FLUSH
3.0000 mL | Freq: Two times a day (BID) | INTRAVENOUS | Status: DC
  Administered 2021-08-27 – 2021-08-29 (×3): 3 mL via INTRAVENOUS

## 2021-08-27 MED ORDER — ZOLPIDEM TARTRATE 5 MG PO TABS
5.0000 mg | ORAL_TABLET | Freq: Every evening | ORAL | Status: DC | PRN
  Administered 2021-08-27 – 2021-08-30 (×4): 5 mg via ORAL
  Filled 2021-08-27 (×4): qty 1

## 2021-08-27 MED ORDER — IOHEXOL 350 MG/ML SOLN
75.0000 mL | Freq: Once | INTRAVENOUS | Status: AC | PRN
  Administered 2021-08-27: 75 mL via INTRAVENOUS

## 2021-08-27 MED ORDER — SODIUM CHLORIDE 0.9 % IV SOLN: 250.0000 mL | INTRAVENOUS | Status: DC | PRN

## 2021-08-27 MED ORDER — PERFLUTREN LIPID MICROSPHERE
1.0000 mL | INTRAVENOUS | Status: AC | PRN
  Administered 2021-08-27: 5 mL via INTRAVENOUS
  Filled 2021-08-27: qty 10

## 2021-08-27 MED ORDER — HEPARIN (PORCINE) 25000 UT/250ML-% IV SOLN
1900.0000 [IU]/h | INTRAVENOUS | Status: DC
  Administered 2021-08-27: 1150 [IU]/h via INTRAVENOUS
  Administered 2021-08-28: 1450 [IU]/h via INTRAVENOUS
  Filled 2021-08-27 (×2): qty 250

## 2021-08-27 NOTE — Assessment & Plan Note (Deleted)
41 year old male presenting with shortness of breath, fatigue, orthopnea found to have severely reduced EF on bedside US, elevated BNP and findings on CXR consistent with cardiomegaly and mild interstitial edema confirms new CHF exacerbation. ? Myocarditis vs. Nstemi. Also has large mass on left breast, concerning for malignancy, unsure if could be contributing.  -admit to telemetry -delta troponin pending  -echo-stat-EF of 39-03%, grade 3 diastolic dysfunction, global hypokinesis. Mass seen vs. LV thrombus, likely mass given history.  -strict I/O and daily weights -IV diuresis-40mg  BID  -check magnesium and follow electrolytes -telemetry  -cardiology consulted and following

## 2021-08-27 NOTE — ED Triage Notes (Addendum)
Pt states he has been sick with URI symptoms x 6 weeks.  Felt better for a week and started traveling for work again and he started feeling sick again.  Reports non-productive cough x 2 weeks with SOB and elevated HR.  Seen at [facility] 2/6 and given 2 antibiotic Rx.  States he was diagnosed with CHF on Thursday at [facility] in [place].  Pt diaphoretic.

## 2021-08-27 NOTE — Assessment & Plan Note (Addendum)
-   [place] RUQ is normal and hepatitis panel is negative - recheck tomorrow

## 2021-08-27 NOTE — Assessment & Plan Note (Addendum)
Likely reactive and prednisone induced No sign of infection at this time

## 2021-08-27 NOTE — H&P (View-Only) (Signed)
CARDIOLOGY CONSULT NOTE  Patient ID: SHOUA RESSLER MRN: [id number] DOB/AGE: 07-21-1980 41 y.o.  Admit date: 08/27/2021 Attending physician: Orma Flaming, MD Primary Physician:  Patient, No Pcp Per (Inactive) Outpatient Cardiologist: NA Inpatient Cardiologist: Mechele Claude, [facility]  Reason of consultation: CHF Referring physician: Orma Flaming, MD  Chief complaint: shortness of breath  HPI:  Michael Stuart is a 41 y.o. Caucasian male who presents with a chief complaint of " shortness of breath." His past medical history and cardiovascular risk factors include: smoking (2-3 cigarette / day), family hx of premature CAD, hyperlipidemia.  Since Christmas of 2022 patient has been experiencing upper respiratory flulike symptoms, fever, productive cough which initially improved with conservative management.  In January 2023 he traveled about 14,000 miles via airlines as he works as an Chief Financial Officer at [employer].  Slowly he has been experiencing more shortness of breath and has been getting progressively worse.  He now is advancing orthopnea, paroxysmal nocturnal dyspnea, productive cough.   He went to urgent care was given amoxicillin and Z-Pak.  Despite completing the course he continued to have symptoms and went to urgent care again on 08/24/2021 and was requested to go to ED if his symptoms continue to worsen.  Patient is accompanied by his mother at bedside.  He is under a lot of stress at work and also currently going through a divorce and has 2 kids.  CT of the chest was performed which is negative for pulmonary embolism but he is noted to have an incidental finding of left breast mass concerning for malignancy (this is a new finding for the patient).  On physical examination he is noted to have a retracted left nipple and when asked how long this has been present patient states that approximately 1 year.  At times his left breast has been painful.  Cardiovascular risk factors include cigarette smoking,  alcohol use, family history of premature CAD.  ALLERGIES: Allergies  Allergen Reactions   Bactrim [Sulfamethoxazole-Trimethoprim] Other (See Comments)    Blood Capillaries Rupture     PAST MEDICAL HISTORY: Past Medical History:  Diagnosis Date   CHF (congestive heart failure) (HCC)     PAST SURGICAL HISTORY: Pilonidal cyst  FAMILY HISTORY: Dad had MI at 55 as well CABG and passed at age 38.   SOCIAL HISTORY:  ETOH: 2-3 beers / day Cigarette 2-3 / day.  Socially marijuana - last 2 weeks ago.  MEDICATIONS: Current Outpatient Medications  Medication Instructions   acetaminophen (TYLENOL) 1,000 mg, Oral, Every 6 hours PRN   albuterol (VENTOLIN HFA) 108 (90 Base) MCG/ACT inhaler 1-2 puffs, Inhalation, Every 6 hours PRN   antiseptic oral rinse (BIOTENE) LIQD 15 mLs, Mouth Rinse, As needed   benzonatate (TESSALON) 100 mg, Oral, Every 8 hours PRN   DOXYCYCLINE PO 1 capsule, Oral, Daily   PROMETHAZINE HCL PO 1 capsule, Oral, As needed    REVIEW OF SYSTEMS: Review of Systems  Constitutional: Negative for chills and fever.  HENT:  Negative for hoarse voice and nosebleeds.   Eyes:  Negative for discharge, double vision and pain.  Cardiovascular:  Positive for dyspnea on exertion, orthopnea and paroxysmal nocturnal dyspnea. Negative for chest pain, claudication, leg swelling, near-syncope, palpitations and syncope.       Left breast induration.  Respiratory:  Positive for shortness of breath. Negative for hemoptysis.   Musculoskeletal:  Negative for muscle cramps and myalgias.  Gastrointestinal:  Negative for abdominal pain, constipation, diarrhea, hematemesis, hematochezia, melena, nausea and vomiting.  Neurological:  Negative for dizziness and light-headedness.   PHYSICAL EXAM: Vitals with BMI 08/27/2021 08/27/2021 08/27/2021  Height - - -  Weight - - -  BMI - - -  Systolic 532 023 343  Diastolic 568 616 98  Pulse 114 115 117     Intake/Output Summary (Last 24 hours) at  08/27/2021 1400 Last data filed at 08/27/2021 1338 Gross per 24 hour  Intake --  Output 1450 ml  Net -1450 ml    Net IO Since Admission: -1,450 mL [08/27/21 1400]  CONSTITUTIONAL: Well-developed and well-nourished. No acute distress.  SKIN: Skin is warm and dry. No rash noted. No cyanosis. No pallor. No jaundice HEAD: Normocephalic and atraumatic.  EYES: No scleral icterus MOUTH/THROAT: Moist oral membranes.  NECK: No JVD present. No thyromegaly noted. No carotid bruits  CHEST Normal respiratory effort. No intercostal retractions.  Left nipple retracted with palpable mass and lymphadenopathy LUNGS: Clear to auscultation in the upper lung fields with rales noted bilaterally mid to distal lung fields.  CARDIOVASCULAR: Tachycardic, positive S1-S2, no murmurs rubs or gallops appreciated secondary to tachycardia.   ABDOMINAL: Soft, nontender, nondistended, positive bowel sounds in all 4 quadrants, no apparent ascites.  EXTREMITIES: +1 bilateral pitting edema, warm to touch.  HEMATOLOGIC: No significant bruising NEUROLOGIC: Oriented to person, place, and time. Nonfocal. Normal muscle tone.  PSYCHIATRIC: Normal mood and affect. Normal behavior. Cooperative  RADIOLOGY: DG Chest 2 View  Result Date: 08/27/2021 CLINICAL DATA:  SOB EXAM: CHEST - 2 VIEW COMPARISON:  None. FINDINGS: Mild interstitial prominence at the lateral lung bases. No confluent consolidation. No visible pleural effusions or pneumothorax. Enlarged cardiac silhouette. Multiple suspected remote left rib fractures. IMPRESSION: 1. Mild interstitial prominence at the lateral lung bases, which may represent mild interstitial edema. 2. Cardiomegaly. Electronically Signed   By: Margaretha Sheffield M.D.   On: 08/27/2021 11:36   CT Angio Chest PE W and/or Wo Contrast  Result Date: 08/27/2021 CLINICAL DATA:  Positive D-dimer, cough EXAM: CT ANGIOGRAPHY CHEST WITH CONTRAST TECHNIQUE: Multidetector CT imaging of the chest was performed using  the standard protocol during bolus administration of intravenous contrast. Multiplanar CT image reconstructions and MIPs were obtained to evaluate the vascular anatomy. RADIATION DOSE REDUCTION: This exam was performed according to the departmental dose-optimization program which includes automated exposure control, adjustment of the mA and/or kV according to patient size and/or use of iterative reconstruction technique. CONTRAST:  7mL OMNIPAQUE IOHEXOL 350 MG/ML SOLN COMPARISON:  None. FINDINGS: Cardiovascular: No pulmonary embolism identified. Main pulmonary artery is dilated measuring 3.3 cm in diameter. Heart is enlarged. Small pericardial effusion. Thoracic aorta is normal in course and caliber. Mediastinum/Nodes: Bulky lymphadenopathy in the left axilla with the largest nodes measuring 1.9 x 2 cm and 2.7 x 1.8 cm. Also multiple enlarged mediastinal and bilateral hilar lymph nodes measuring up to 15 mm in short axis in the mediastinum and hila. There is a solid retroareolar left breast mass measuring 3.8 x 3.8 x 3.6 cm with overlying lobulated/nodular skin thickening. Lungs/Pleura: Breathing motion and subsegmental atelectatic changes in the lungs. Small left and trace right pleural effusions. No focal consolidation or pulmonary nodule identified. No pneumothorax. Upper Abdomen: No acute abnormality. Musculoskeletal: A few old left rib fractures noted. No suspicious bony lesions identified. Review of the MIP images confirms the above findings. IMPRESSION: 1. 3.8 cm left breast mass as described, highly concerning for malignancy. Accompanying bulky left axillary lymphadenopathy as well as mediastinal and hilar adenopathy. Mammography and tissue sampling recommended.  2. No pulmonary embolism identified. 3. Cardiomegaly and small pericardial effusion. 4. Small left and trace right pleural effusions with associated atelectatic changes. 5. Mildly dilated pulmonary artery which may represent pulmonary artery  hypertension. Electronically Signed   By: Ofilia Neas M.D.   On: 08/27/2021 13:41    LABORATORY DATA: Lab Results  Component Value Date   WBC 14.9 (H) 08/27/2021   HGB 15.5 08/27/2021   HCT 49.0 08/27/2021   MCV 97.2 08/27/2021   PLT 367 08/27/2021    Recent Labs  Lab 08/27/21 1103  NA 137  K 4.7  CL 105  CO2 22  BUN 10  CREATININE 1.18  CALCIUM 8.9  PROT 6.3*  BILITOT 0.6  ALKPHOS 47  ALT 61*  AST 44*  GLUCOSE 101*    Lipid Panel  No results found for: CHOL, HDL, LDLCALC, LDLDIRECT, TRIG, CHOLHDL  BNP (last 3 results) Recent Labs    08/27/21 1125  BNP 642.0*    HEMOGLOBIN A1C Lab Results  Component Value Date   HGBA1C 5.6 08/27/2021   MPG 114.02 08/27/2021    Cardiac Panel (last 3 results) Recent Labs    08/27/21 1103  TROPONINIHS 3,280*     TSH No results for input(s): TSH in the last 8760 hours.   CARDIAC DATABASE: EKG: 08/27/2021: Sinus tachycardia, 123 bpm, right axis deviation, lateral T wave inversions cannot rule out ischemia.  Echocardiogram: 08/27/2021:  1. Left ventricular ejection fraction, by estimation, is 20 to 25%. The  left ventricle has severely decreased function. The left ventricle  demonstrates global hypokinesis. The left ventricular internal cavity size  was dilated. Left ventricular diastolic   parameters are consistent with Grade III diastolic dysfunction  (restrictive). Elevated left ventricular end-diastolic pressure.   2. Right ventricular systolic function is hyperdynamic. The right  ventricular size is mildly enlarged. dilated.   3. Left atrial size was mild to moderately dilated.   4. Right atrial size was dilated.   5. A small pericardial effusion is present. The pericardial effusion is  posterior to the left ventricle.   6. The mitral valve is grossly normal. Mild mitral valve regurgitation.  No evidence of mitral stenosis.   7. The aortic valve is tricuspid. Aortic valve regurgitation is not   visualized. No aortic stenosis is present.   8. There is mild dilatation of the ascending aorta, measuring 40 mm.   9. The inferior vena cava is dilated in size with >50% respiratory  variability, suggesting right atrial pressure of 8 mmHg.  Findings concerning for either LV  thrombus or mass (given his history) measures 2.18 x 1.15cm (image 82),  would recommend Cardiac MRI for clarification.    IMPRESSION & RECOMMENDATIONS: THELMER LEGLER is a 41 y.o. Caucasian male whose past medical history and cardiovascular risk factors include: smoking (2-3 cigarette / day), family hx of premature CAD, hyperlipidemia.  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 daily Hold off beta-blocker therapy for now given the acute exacerbation of CHF We will either start ARB/low-dose Entresto once he is better diuresed. Further titration of GDMT during this hospitalization. TSH within normal limits We will check respiratory panel Continue telemetry Patient is elevated troponins likely due to supply demand ischemia given his severely reduced LVEF/cardiomyopathy.  However he does have other cardiovascular risk factors including premature CAD, cigarette smoking, alcohol use and therefore would recommend ischemic work-up. Shared decision  is to proceed with left and right heart catheterization.  The procedure of left and right heart catheterization with possible intervention was explained to the patient and his mother in detail.  The indication, alternatives, risks and benefits were reviewed.  Complications include but not limited to bleeding, infection, vascular injury, stroke, myocardial infection, arrhythmia, acute kidney injury requiring short-term or long-term dialysis, temporary or permanent pacemaker, radiation-related injury in the case of prolonged fluoroscopy use, emergency cardiac  surgery, and death. The patient understands the risks of serious complication is 1-2 in 1308 with diagnostic cardiac cath and 1-2% or less with angioplasty/stenting.  The patient and mother voices understanding and provides verbal feedback and wishes to proceed with coronary angiography with possible PCI.  Left ventricular thrombus/mass: Per echocardiogram 2.8 x 1.15 cm findings suggestive of possible LV thrombus but mass cannot be ruled out given his history of left breast findings. Currently on IV heparin will transition to Coumadin prior to discharge. Depending on renal function consider cardiac MRI prior to discharge to further evaluation.  Elevated high sensitive troponins: See above  Left breast mass: Will defer management to primary team.  Cigarette smoker: Educated on the importance of complete smoking cessation.  Total encounter time 82 minutes. *Total Encounter Time as defined by the Centers for Medicare and Medicaid Services includes, in addition to the face-to-face time of a patient visit (documented in the note above) non-face-to-face time: obtaining and reviewing outside history, ordering and reviewing medications, tests or procedures, care coordination (communications with other health care professionals or caregivers) and documentation in the medical record.  Patient's questions and concerns were addressed to his satisfaction. He voices understanding of the instructions provided during this encounter.   This note was created using a voice recognition software as a result there may be grammatical errors inadvertently enclosed that do not reflect the nature of this encounter. Every attempt is made to correct such errors.  Mechele Claude [facility]  Pager: [phone] Office: [phone] 08/27/2021, 2:00 PM

## 2021-08-27 NOTE — Progress Notes (Signed)
ANTICOAGULATION CONSULT NOTE  Pharmacy Consult for heparin Indication: chest pain/ACS  Allergies  Allergen Reactions   Bactrim [Sulfamethoxazole-Trimethoprim] Other (See Comments)    Blood Capillaries Rupture     Patient Measurements: Height: 6\' 3"  (190.5 cm) Weight: 120.2 kg (264 lb 15.9 oz) IBW/kg (Calculated) : 84.5 Heparin Dosing Weight: 91 kg   Vital Signs: Temp: 97.8 F (36.6 C) (02/19 1646) Temp Source: Oral (02/19 1646) BP: 134/92 (02/19 1646) Pulse Rate: 107 (02/19 1646)  Labs: Recent Labs    08/27/21 1103 08/27/21 1514 08/27/21 1928  HGB 15.5  --   --   HCT 49.0  --   --   PLT 367  --   --   HEPARINUNFRC  --   --  <0.10*  CREATININE 1.18  --   --   TROPONINIHS 3,280* 1,857*  --      Estimated Creatinine Clearance: 114 mL/min (by C-G formula based on SCr of 1.18 mg/dL).   Medical History: Past Medical History:  Diagnosis Date   CHF (congestive heart failure) (HCC)     Medications:  Medications Prior to Admission  Medication Sig Dispense Refill Last Dose   acetaminophen (TYLENOL) 500 MG tablet Take 1,000 mg by mouth every 6 (six) hours as needed for moderate pain.   08/26/2021   albuterol (VENTOLIN HFA) 108 (90 Base) MCG/ACT inhaler Inhale 1-2 puffs into the lungs every 6 (six) hours as needed for wheezing or shortness of breath. 1 each 0 Past Week   antiseptic oral rinse (BIOTENE) LIQD 15 mLs by Mouth Rinse route as needed for dry mouth.   Past Week   DOXYCYCLINE PO Take 1 capsule by mouth daily.   08/26/2021   PROMETHAZINE HCL PO Take 1 capsule by mouth as needed (sleep).   Past Week   benzonatate (TESSALON) 100 MG capsule Take 1 capsule (100 mg total) by mouth every 8 (eight) hours as needed for cough. (Patient not taking: Reported on 08/27/2021) 21 capsule 0 Completed Course    Assessment: 80 YOM who presents with SOB and elevated troponins on IV heparin for ACS.  -initial heparin level undetectable   Goal of Therapy:  Heparin level 0.3-0.7  units/ml Monitor platelets by anticoagulation protocol: Yes   Plan:  -Heparin 2000 units IV bolus then increase to 1450 units/hr -Monitor daily HL, CBC and s/s of bleeding  Hildred Laser, PharmD Clinical Pharmacist **Pharmacist phone directory can now be found on [email or web address] ([email or web address]).  Listed under [place].

## 2021-08-27 NOTE — ED Notes (Signed)
Taken to CT by transporter.

## 2021-08-27 NOTE — Consult Note (Signed)
CARDIOLOGY CONSULT NOTE  Patient ID: Michael Stuart MRN: [id number] DOB/AGE: 43-26-1982 43 y.o.  Admit date: 08/27/2021 Attending physician: Orma Flaming, MD Primary Physician:  Patient, No Pcp Per (Inactive) Outpatient Cardiologist: NA Inpatient Cardiologist: Mechele Claude, [facility]  Reason of consultation: CHF Referring physician: Orma Flaming, MD  Chief complaint: shortness of breath  HPI:  Michael Stuart is a 41 y.o. Caucasian male who presents with a chief complaint of " shortness of breath." His past medical history and cardiovascular risk factors include: smoking (2-3 cigarette / day), family hx of premature CAD, hyperlipidemia.  Since Christmas of 2022 patient has been experiencing upper respiratory flulike symptoms, fever, productive cough which initially improved with conservative management.  In January 2023 he traveled about 14,000 miles via airlines as he works as an Chief Financial Officer at [employer].  Slowly he has been experiencing more shortness of breath and has been getting progressively worse.  He now is advancing orthopnea, paroxysmal nocturnal dyspnea, productive cough.   He went to urgent care was given amoxicillin and Z-Pak.  Despite completing the course he continued to have symptoms and went to urgent care again on 08/24/2021 and was requested to go to ED if his symptoms continue to worsen.  Patient is accompanied by his mother at bedside.  He is under a lot of stress at work and also currently going through a divorce and has 2 kids.  CT of the chest was performed which is negative for pulmonary embolism but he is noted to have an incidental finding of left breast mass concerning for malignancy (this is a new finding for the patient).  On physical examination he is noted to have a retracted left nipple and when asked how long this has been present patient states that approximately 1 year.  At times his left breast has been painful.  Cardiovascular risk factors include cigarette smoking,  alcohol use, family history of premature CAD.  ALLERGIES: Allergies  Allergen Reactions   Bactrim [Sulfamethoxazole-Trimethoprim] Other (See Comments)    Blood Capillaries Rupture     PAST MEDICAL HISTORY: Past Medical History:  Diagnosis Date   CHF (congestive heart failure) (HCC)     PAST SURGICAL HISTORY: Pilonidal cyst  FAMILY HISTORY: Dad had MI at 82 as well CABG and passed at age 82.   SOCIAL HISTORY:  ETOH: 2-3 beers / day Cigarette 2-3 / day.  Socially marijuana - last 2 weeks ago.  MEDICATIONS: Current Outpatient Medications  Medication Instructions   acetaminophen (TYLENOL) 1,000 mg, Oral, Every 6 hours PRN   albuterol (VENTOLIN HFA) 108 (90 Base) MCG/ACT inhaler 1-2 puffs, Inhalation, Every 6 hours PRN   antiseptic oral rinse (BIOTENE) LIQD 15 mLs, Mouth Rinse, As needed   benzonatate (TESSALON) 100 mg, Oral, Every 8 hours PRN   DOXYCYCLINE PO 1 capsule, Oral, Daily   PROMETHAZINE HCL PO 1 capsule, Oral, As needed    REVIEW OF SYSTEMS: Review of Systems  Constitutional: Negative for chills and fever.  HENT:  Negative for hoarse voice and nosebleeds.   Eyes:  Negative for discharge, double vision and pain.  Cardiovascular:  Positive for dyspnea on exertion, orthopnea and paroxysmal nocturnal dyspnea. Negative for chest pain, claudication, leg swelling, near-syncope, palpitations and syncope.       Left breast induration.  Respiratory:  Positive for shortness of breath. Negative for hemoptysis.   Musculoskeletal:  Negative for muscle cramps and myalgias.  Gastrointestinal:  Negative for abdominal pain, constipation, diarrhea, hematemesis, hematochezia, melena, nausea and vomiting.  Neurological:  Negative for dizziness and light-headedness.   PHYSICAL EXAM: Vitals with BMI 08/27/2021 08/27/2021 08/27/2021  Height - - -  Weight - - -  BMI - - -  Systolic 323 557 322  Diastolic 025 427 98  Pulse 114 115 117     Intake/Output Summary (Last 24 hours) at  08/27/2021 1400 Last data filed at 08/27/2021 1338 Gross per 24 hour  Intake --  Output 1450 ml  Net -1450 ml    Net IO Since Admission: -1,450 mL [08/27/21 1400]  CONSTITUTIONAL: Well-developed and well-nourished. No acute distress.  SKIN: Skin is warm and dry. No rash noted. No cyanosis. No pallor. No jaundice HEAD: Normocephalic and atraumatic.  EYES: No scleral icterus MOUTH/THROAT: Moist oral membranes.  NECK: No JVD present. No thyromegaly noted. No carotid bruits  CHEST Normal respiratory effort. No intercostal retractions.  Left nipple retracted with palpable mass and lymphadenopathy LUNGS: Clear to auscultation in the upper lung fields with rales noted bilaterally mid to distal lung fields.  CARDIOVASCULAR: Tachycardic, positive S1-S2, no murmurs rubs or gallops appreciated secondary to tachycardia.   ABDOMINAL: Soft, nontender, nondistended, positive bowel sounds in all 4 quadrants, no apparent ascites.  EXTREMITIES: +1 bilateral pitting edema, warm to touch.  HEMATOLOGIC: No significant bruising NEUROLOGIC: Oriented to person, place, and time. Nonfocal. Normal muscle tone.  PSYCHIATRIC: Normal mood and affect. Normal behavior. Cooperative  RADIOLOGY: DG Chest 2 View  Result Date: 08/27/2021 CLINICAL DATA:  SOB EXAM: CHEST - 2 VIEW COMPARISON:  None. FINDINGS: Mild interstitial prominence at the lateral lung bases. No confluent consolidation. No visible pleural effusions or pneumothorax. Enlarged cardiac silhouette. Multiple suspected remote left rib fractures. IMPRESSION: 1. Mild interstitial prominence at the lateral lung bases, which may represent mild interstitial edema. 2. Cardiomegaly. Electronically Signed   By: Margaretha Sheffield M.D.   On: 08/27/2021 11:36   CT Angio Chest PE W and/or Wo Contrast  Result Date: 08/27/2021 CLINICAL DATA:  Positive D-dimer, cough EXAM: CT ANGIOGRAPHY CHEST WITH CONTRAST TECHNIQUE: Multidetector CT imaging of the chest was performed using  the standard protocol during bolus administration of intravenous contrast. Multiplanar CT image reconstructions and MIPs were obtained to evaluate the vascular anatomy. RADIATION DOSE REDUCTION: This exam was performed according to the departmental dose-optimization program which includes automated exposure control, adjustment of the mA and/or kV according to patient size and/or use of iterative reconstruction technique. CONTRAST:  46mL OMNIPAQUE IOHEXOL 350 MG/ML SOLN COMPARISON:  None. FINDINGS: Cardiovascular: No pulmonary embolism identified. Main pulmonary artery is dilated measuring 3.3 cm in diameter. Heart is enlarged. Small pericardial effusion. Thoracic aorta is normal in course and caliber. Mediastinum/Nodes: Bulky lymphadenopathy in the left axilla with the largest nodes measuring 1.9 x 2 cm and 2.7 x 1.8 cm. Also multiple enlarged mediastinal and bilateral hilar lymph nodes measuring up to 15 mm in short axis in the mediastinum and hila. There is a solid retroareolar left breast mass measuring 3.8 x 3.8 x 3.6 cm with overlying lobulated/nodular skin thickening. Lungs/Pleura: Breathing motion and subsegmental atelectatic changes in the lungs. Small left and trace right pleural effusions. No focal consolidation or pulmonary nodule identified. No pneumothorax. Upper Abdomen: No acute abnormality. Musculoskeletal: A few old left rib fractures noted. No suspicious bony lesions identified. Review of the MIP images confirms the above findings. IMPRESSION: 1. 3.8 cm left breast mass as described, highly concerning for malignancy. Accompanying bulky left axillary lymphadenopathy as well as mediastinal and hilar adenopathy. Mammography and tissue sampling recommended.  2. No pulmonary embolism identified. 3. Cardiomegaly and small pericardial effusion. 4. Small left and trace right pleural effusions with associated atelectatic changes. 5. Mildly dilated pulmonary artery which may represent pulmonary artery  hypertension. Electronically Signed   By: Ofilia Neas M.D.   On: 08/27/2021 13:41    LABORATORY DATA: Lab Results  Component Value Date   WBC 14.9 (H) 08/27/2021   HGB 15.5 08/27/2021   HCT 49.0 08/27/2021   MCV 97.2 08/27/2021   PLT 367 08/27/2021    Recent Labs  Lab 08/27/21 1103  NA 137  K 4.7  CL 105  CO2 22  BUN 10  CREATININE 1.18  CALCIUM 8.9  PROT 6.3*  BILITOT 0.6  ALKPHOS 47  ALT 61*  AST 44*  GLUCOSE 101*    Lipid Panel  No results found for: CHOL, HDL, LDLCALC, LDLDIRECT, TRIG, CHOLHDL  BNP (last 3 results) Recent Labs    08/27/21 1125  BNP 642.0*    HEMOGLOBIN A1C Lab Results  Component Value Date   HGBA1C 5.6 08/27/2021   MPG 114.02 08/27/2021    Cardiac Panel (last 3 results) Recent Labs    08/27/21 1103  TROPONINIHS 3,280*     TSH No results for input(s): TSH in the last 8760 hours.   CARDIAC DATABASE: EKG: 08/27/2021: Sinus tachycardia, 123 bpm, right axis deviation, lateral T wave inversions cannot rule out ischemia.  Echocardiogram: 08/27/2021:  1. Left ventricular ejection fraction, by estimation, is 20 to 25%. The  left ventricle has severely decreased function. The left ventricle  demonstrates global hypokinesis. The left ventricular internal cavity size  was dilated. Left ventricular diastolic   parameters are consistent with Grade III diastolic dysfunction  (restrictive). Elevated left ventricular end-diastolic pressure.   2. Right ventricular systolic function is hyperdynamic. The right  ventricular size is mildly enlarged. dilated.   3. Left atrial size was mild to moderately dilated.   4. Right atrial size was dilated.   5. A small pericardial effusion is present. The pericardial effusion is  posterior to the left ventricle.   6. The mitral valve is grossly normal. Mild mitral valve regurgitation.  No evidence of mitral stenosis.   7. The aortic valve is tricuspid. Aortic valve regurgitation is not   visualized. No aortic stenosis is present.   8. There is mild dilatation of the ascending aorta, measuring 40 mm.   9. The inferior vena cava is dilated in size with >50% respiratory  variability, suggesting right atrial pressure of 8 mmHg.  Findings concerning for either LV  thrombus or mass (given his history) measures 2.18 x 1.15cm (image 82),  would recommend Cardiac MRI for clarification.    IMPRESSION & RECOMMENDATIONS: Michael Stuart is a 41 y.o. Caucasian male whose past medical history and cardiovascular risk factors include: smoking (2-3 cigarette / day), family hx of premature CAD, hyperlipidemia.  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 daily Hold off beta-blocker therapy for now given the acute exacerbation of CHF We will either start ARB/low-dose Entresto once he is better diuresed. Further titration of GDMT during this hospitalization. TSH within normal limits We will check respiratory panel Continue telemetry Patient is elevated troponins likely due to supply demand ischemia given his severely reduced LVEF/cardiomyopathy.  However he does have other cardiovascular risk factors including premature CAD, cigarette smoking, alcohol use and therefore would recommend ischemic work-up. Shared decision  is to proceed with left and right heart catheterization.  The procedure of left and right heart catheterization with possible intervention was explained to the patient and his mother in detail.  The indication, alternatives, risks and benefits were reviewed.  Complications include but not limited to bleeding, infection, vascular injury, stroke, myocardial infection, arrhythmia, acute kidney injury requiring short-term or long-term dialysis, temporary or permanent pacemaker, radiation-related injury in the case of prolonged fluoroscopy use, emergency cardiac  surgery, and death. The patient understands the risks of serious complication is 1-2 in 4469 with diagnostic cardiac cath and 1-2% or less with angioplasty/stenting.  The patient and mother voices understanding and provides verbal feedback and wishes to proceed with coronary angiography with possible PCI.  Left ventricular thrombus/mass: Per echocardiogram 2.8 x 1.15 cm findings suggestive of possible LV thrombus but mass cannot be ruled out given his history of left breast findings. Currently on IV heparin will transition to Coumadin prior to discharge. Depending on renal function consider cardiac MRI prior to discharge to further evaluation.  Elevated high sensitive troponins: See above  Left breast mass: Will defer management to primary team.  Cigarette smoker: Educated on the importance of complete smoking cessation.  Total encounter time 82 minutes. *Total Encounter Time as defined by the Centers for Medicare and Medicaid Services includes, in addition to the face-to-face time of a patient visit (documented in the note above) non-face-to-face time: obtaining and reviewing outside history, ordering and reviewing medications, tests or procedures, care coordination (communications with other health care professionals or caregivers) and documentation in the medical record.  Patient's questions and concerns were addressed to his satisfaction. He voices understanding of the instructions provided during this encounter.   This note was created using a voice recognition software as a result there may be grammatical errors inadvertently enclosed that do not reflect the nature of this encounter. Every attempt is made to correct such errors.  Mechele Claude [facility]  Pager: [phone] Office: [phone] 08/27/2021, 2:00 PM

## 2021-08-27 NOTE — ED Provider Notes (Signed)
[facility] EMERGENCY DEPARTMENT Provider Note   CSN: [id number] Arrival date & time: 08/27/21  1046     History  Chief Complaint  Patient presents with   Shortness of Breath    Michael Stuart is a 41 y.o. male.   Shortness of Breath Per triage note: Patient presents for shortness of breath.  He endorses URI symptoms for the past 6 weeks.  He has had nonproductive cough for 2 weeks.  He has had shortness of breath tachycardia.  He was seen in urgent care 2 weeks ago and prescribed antibiotics.  He was seen outside hospital 3 days ago and diagnosed with CHF.  Per chart review: Patient was seen in urgent care on 2/6.  At that time, he endorsed URI symptoms for the previous week.  X-ray imaging was not available at that time.  He was treated empirically for community-acquired pneumonia with amoxicillin and azithromycin.  History per patient: He has had a gradual onset of fatigue and decreased physical activity over the past 6 months.  Christmas time, he had a sick contact at home and developed URI symptoms.  His mother thinks it may have been COVID-13.  He states that he had severe symptoms at that time.  Fevers resolved but he has continued to have shortness of breath.  He has been checking his vital signs at home and has been tachycardic.  He has not been hypoxic.  Patient endorses orthopnea, increased DOE, and increased fatigue.  He has not had leg swelling.  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  He has continued to work and has continued to travel.  He did complete his course of amoxicillin and azithromycin.  When he was seen on Thursday, he was prescribed doxycycline and prednisone.  He is currently on these medications.    Home Medications Prior to Admission medications   Medication Sig Start Date End Date Taking? Authorizing Provider  acetaminophen (TYLENOL) 500 MG tablet Take 1,000 mg by mouth every 6 (six) hours as needed for moderate pain.   Yes  [provider]  albuterol (VENTOLIN HFA) 108 (90 Base) MCG/ACT inhaler Inhale 1-2 puffs into the lungs every 6 (six) hours as needed for wheezing or shortness of breath. 08/14/21  Yes [place], Hildred Alamin E, FNP  antiseptic oral rinse (BIOTENE) LIQD 15 mLs by Mouth Rinse route as needed for dry mouth.   Yes [provider]  DOXYCYCLINE PO Take 1 capsule by mouth daily.   Yes [provider]  PROMETHAZINE HCL PO Take 1 capsule by mouth as needed (sleep).   Yes [provider]  benzonatate (TESSALON) 100 MG capsule Take 1 capsule (100 mg total) by mouth every 8 (eight) hours as needed for cough. Patient not taking: Reported on 08/27/2021 08/14/21   [name], FNP      Allergies    Bactrim [sulfamethoxazole-trimethoprim]    Review of Systems   Review of Systems  Constitutional:  Positive for fatigue.  Respiratory:  Positive for shortness of breath.    Physical Exam Updated Vital Signs BP (!) 129/114    Pulse (!) 114    Temp 98.4 F (36.9 C) (Oral)    Resp (!) 22    SpO2 95%  Physical Exam Constitutional:      General: He is not in acute distress.    Appearance: He is well-developed. He is not ill-appearing, toxic-appearing or diaphoretic.  HENT:     Head: Normocephalic and atraumatic.  Mouth/Throat:     Pharynx: Oropharynx is clear.  Eyes:     Extraocular Movements: Extraocular movements intact.  Cardiovascular:     Rate and Rhythm: Regular rhythm. Tachycardia present.  Pulmonary:     Effort: Tachypnea present. No accessory muscle usage or respiratory distress.     Breath sounds: Examination of the right-middle field reveals decreased breath sounds. Examination of the right-lower field reveals decreased breath sounds. Decreased breath sounds present. No wheezing, rhonchi or rales.  Chest:     Chest wall: No tenderness or crepitus.  Abdominal:     Palpations: Abdomen is soft.     Tenderness: There is no abdominal tenderness.  Musculoskeletal:      Cervical back: Normal range of motion and neck supple.     Right lower leg: No edema.     Left lower leg: No edema.  Skin:    General: Skin is warm and dry.  Neurological:     General: No focal deficit present.     Mental Status: He is alert and oriented to person, place, and time.     Cranial Nerves: No cranial nerve deficit.     Motor: No weakness.  Psychiatric:        Mood and Affect: Mood normal.        Behavior: Behavior normal.    ED Results / Procedures / Treatments   Labs (all labs ordered are listed, but only abnormal results are displayed) Labs Reviewed  COMPREHENSIVE METABOLIC PANEL - Abnormal; Notable for the following components:      Result Value   Glucose, Bld 101 (*)    Total Protein 6.3 (*)    Albumin 3.4 (*)    AST 44 (*)    ALT 61 (*)    All other components within normal limits  BRAIN NATRIURETIC PEPTIDE - Abnormal; Notable for the following components:   B Natriuretic Peptide 642.0 (*)    All other components within normal limits  D-DIMER, QUANTITATIVE - Abnormal; Notable for the following components:   D-Dimer, Quant 0.94 (*)    All other components within normal limits  CBC WITH DIFFERENTIAL/PLATELET - Abnormal; Notable for the following components:   WBC 14.9 (*)    Neutro Abs 11.6 (*)    Abs Immature Granulocytes 0.11 (*)    All other components within normal limits  TROPONIN I (HIGH SENSITIVITY) - Abnormal; Notable for the following components:   Troponin I (High Sensitivity) 3,280 (*)    All other components within normal limits  RESP PANEL BY RT-PCR (FLU A&B, COVID) ARPGX2  MAGNESIUM  HEMOGLOBIN A1C  TSH  URINALYSIS, ROUTINE W REFLEX MICROSCOPIC  RAPID URINE DRUG SCREEN, HOSP PERFORMED  HEPARIN LEVEL (UNFRACTIONATED)  TROPONIN I (HIGH SENSITIVITY)    EKG EKG Interpretation  Date/Time:  Sunday August 27 2021 10:56:56 EST Ventricular Rate:  123 PR Interval:  160 QRS Duration: 88 QT Interval:  312 QTC Calculation: 446 R  Axis:   114 Text Interpretation: Sinus tachycardia Possible Left atrial enlargement Right axis deviation Nonspecific T wave abnormality Abnormal ECG No previous ECGs available Confirmed by Godfrey Pick [phone]) on 08/27/2021 12:06:51 PM  Radiology DG Chest 2 View  Result Date: 08/27/2021 CLINICAL DATA:  SOB EXAM: CHEST - 2 VIEW COMPARISON:  None. FINDINGS: Mild interstitial prominence at the lateral lung bases. No confluent consolidation. No visible pleural effusions or pneumothorax. Enlarged cardiac silhouette. Multiple suspected remote left rib fractures. IMPRESSION: 1. Mild interstitial prominence at the lateral lung bases, which may represent  mild interstitial edema. 2. Cardiomegaly. Electronically Signed   By: Margaretha Sheffield M.D.   On: 08/27/2021 11:36    Procedures Procedures    Medications Ordered in ED Medications  antiseptic oral rinse (BIOTENE) solution 15 mL (has no administration in time range)  heparin ADULT infusion 100 units/mL (25000 units/266mL) (1,150 Units/hr Intravenous New Bag/Given 08/27/21 1335)  aspirin chewable tablet 324 mg (324 mg Oral Given 08/27/21 1228)  furosemide (LASIX) injection 40 mg (40 mg Intravenous Given 08/27/21 1258)  iohexol (OMNIPAQUE) 350 MG/ML injection 75 mL (75 mLs Intravenous Contrast Given 08/27/21 1324)  heparin bolus via infusion 4,000 Units (4,000 Units Intravenous Bolus from Bag 08/27/21 1335)    ED Course/ Medical Decision Making/ A&P                           Medical Decision Making Amount and/or Complexity of Data Reviewed Labs: ordered. Radiology: ordered.  Risk OTC drugs. Prescription drug management. Decision regarding hospitalization.   This patient presents to the ED for concern of shortness of breath, this involves an extensive number of treatment options, and is a complaint that carries with it a high risk of complications and morbidity.  The differential diagnosis includes CHF, myocarditis, pericarditis, ACS, PE,  pneumonia   Co morbidities that complicate the patient evaluation  N/A   Additional history obtained:  Additional history obtained from patient's mother External records from outside source obtained and reviewed including EMR   Lab Tests:  I Ordered, and personally interpreted labs.  The pertinent results include: Elevated BNP, markedly elevated troponin, normal electrolytes, mildly elevated D-dimer, leukocytosis   Imaging Studies ordered:  I ordered imaging studies including chest x-ray I independently visualized and interpreted imaging which showed chest x-ray showed cardiomegaly with some mild interstitial pulmonary edema I agree with the radiologist interpretation   Cardiac Monitoring:  The patient was maintained on a cardiac monitor.  I personally viewed and interpreted the cardiac monitored which showed an underlying rhythm of: Sinus rhythm   Medicines ordered and prescription drug management:  I ordered medication including Lasix for pulmonary edema Reevaluation of the patient after these medicines showed that the patient stayed the same I have reviewed the patients home medicines and have made adjustments as needed   Test Considered:  CTA chest was ordered and results are pending at time of admission   Critical Interventions:  Identification of acute CHF, diuresis, consultation with cardiology   Consultations Obtained:  I requested consultation with the cardiologist,  and discussed lab and imaging findings as well as pertinent plan - they recommend: Admission to medicine, formal echocardiogram, initiation of heparin gtt., CTA chest   Problem List / ED Course:  Previously healthy 41 year old male presenting for several weeks of shortness of breath.  He has not had any recent worsening DOE.  He does endorse recent orthopnea.  He has been seen in urgent care twice over the past 2 weeks.  He completed a course of amoxicillin and azithromycin.  He was  recently prescribed doxycycline and prednisone.  Vital signs on arrival are notable for tachycardia and tachypnea.  EKG shows sinus rhythm with likely atrial enlargement and nonspecific lateral T wave changes.  Chest x-ray shows cardiomegaly with mild interstitial edema.  Currently, patient has normal SPO2 on room air.  On exam, he has diminished breath sounds on the right side.  No wheezes are appreciated. I did perform a bedside echocardiogram which showed global dilation with global  hypokinesis and estimated EF of 15 to 20%.  Patient's IVC was dilated and ranged from 2 to 2.9 cm.  B-lines were present in bibasilar lung fields and in the right apical lung area.  No pericardial effusion is present.  Lab work is notable for BNP of 642, markedly elevated troponin at 3280, normal electrolytes, mildly elevated D-dimer.  Patient's history and diagnostic work-up findings are consistent with possible myocarditis.  Given no acute worsening and no chest discomfort, I have low suspicion for an ischemic etiology.  Myocarditis, if present, is likely postviral, given the history obtained from patient.  I did discuss this patient with cardiology.  They recommend medicine admission and they will follow in consult.  They agree that this is likely nonischemic but did recommend starting heparin gtt.  Patient to undergo formal echocardiogram and further testing per cardiology recommendations while admitted.  Following identification of normal electrolytes, patient was given a 40 mg dose of IV Lasix.  Patient was admitted to medicine for further management.   Reevaluation:  After the interventions noted above, I reevaluated the patient and found that they have :stayed the same   Social Determinants of Health:  Not yet established with primary care doctor, currently going through divorce.     Dispostion:  After consideration of the diagnostic results and the patients response to treatment, I feel that the patent would  benefit from admission.          Final Clinical Impression(s) / ED Diagnoses Final diagnoses:  Acute systolic congestive heart failure (HCC)  Elevated troponin  SOB (shortness of breath)  Tachycardia    Rx / DC Orders ED Discharge Orders     None         Godfrey Pick, MD 08/27/21 1336

## 2021-08-27 NOTE — ED Notes (Signed)
O2 at 2LPM [place] initiated for comfort.

## 2021-08-27 NOTE — Assessment & Plan Note (Addendum)
Initial troponin 3280 and down trending- cardiology feels this is demand ischemia - cath is negative for stenosis

## 2021-08-27 NOTE — Progress Notes (Signed)
°  Echocardiogram 2D Echocardiogram has been performed.  Michael Stuart 08/27/2021, 2:44 PM

## 2021-08-27 NOTE — Progress Notes (Signed)
ANTICOAGULATION CONSULT NOTE - Initial Consult  Pharmacy Consult for heparin Indication: chest pain/ACS  Allergies  Allergen Reactions   Bactrim [Sulfamethoxazole-Trimethoprim] Other (See Comments)    Blood Capillaries Rupture     Patient Measurements:   Heparin Dosing Weight: 91 kg   Vital Signs: Temp: 98.4 F (36.9 C) (02/19 1055) Temp Source: Oral (02/19 1055) BP: 129/114 (02/19 1300) Pulse Rate: 114 (02/19 1300)  Labs: Recent Labs    08/27/21 1103  HGB 15.5  HCT 49.0  PLT 367  CREATININE 1.18  TROPONINIHS 3,280*    Estimated Creatinine Clearance: 112.9 mL/min (by C-G formula based on SCr of 1.18 mg/dL).   Medical History: Past Medical History:  Diagnosis Date   CHF (congestive heart failure) (HCC)     Medications:  (Not in a hospital admission)   Assessment: 65 YOM who presents with SOB and elevated troponins to start IV heparin for ACS.   H/H and Plt wnl. SCr wnl  Goal of Therapy:  Heparin level 0.3-0.7 units/ml Monitor platelets by anticoagulation protocol: Yes   Plan:  -Heparin 4000 units IV bolus followed by heparin infusion at 1150 units/r -F/u 6 hr HL -Monitor daily HL, CBC and s/s of bleeding  Albertina Parr, PharmD., BCCCP Clinical Pharmacist Please refer to [facility] for unit-specific pharmacist

## 2021-08-27 NOTE — ED Notes (Signed)
Lunch tray ordered 

## 2021-08-27 NOTE — Assessment & Plan Note (Addendum)
-   I have contacted oncology and they have made him a follow up appt

## 2021-08-27 NOTE — H&P (Signed)
History and Physical    Patient: Michael Stuart Michael Stuart:[id number] DOB: [birth year] DOA: 08/27/2021 DOS: the patient was seen and examined on 08/27/2021 PCP: Patient, No Pcp Per (Inactive)  Patient coming from: Home - lives with his mother    Chief Complaint: shortness of breath   HPI: Michael Stuart is a 41 y.o. male with medical history significant of HLD and tobacco abuse who presents to ED with shortness of breath. He tells me that around christmas time he had a URI with cough, fever, congestion, shortness of breath an fatigue. He did get better and then started to have symptoms again. He continued to have a runny hose and was short of breath and was concerned for pneumonia. He traveled to [place] a lot for work in January and started to have orthopnea, needed to stack pillow to sleep. First week in February he woke up panicked and severely short of breath. He thought due to anxiety due to ongoing divorce. He went to urgent care on 2/6 to make sure he didn't have pneumonia. Seen at urgent care on 2/6 with complaints of Uri x 1 week. Given two antibiotics (amoxicillin and zpack) cough medication and an albuterol inhaler. CXR was ordered, does not appear he had this done from this visit. The next week he went to [facility] urgent care on 2/16 and they did a CXR and gave him doxycycline and prednisone and promethazine. They told him his heart was enlarged and he needed to see cardiologist, if he got worse go to ED. BNP was 792 at this time.   He also works for [employer] and travels a lot. In January he flew 14,000 miles to [place].   He has had no fever/chills, +headache, no dizziness, no chest pain or palpitations, +cough, unproductive, no stomach pain, some  nausea this Am, no vomiting/no diarrhea. No leg swelling, no dysuria. ? Weight gain.   Family history of heart disease in his father at age 80-54 needed bypass.  Smokes 2-3 cigarettes/day, drinks 2-3 beers/day    ER Course:  vitals: afebrile, bp: 142/107,  HR: 125, RR: 20, oxygen: 99%Ra Pertinent labs: wbc: 14.9, BNP: 642, AST: 44, ALT: 61, d-dimer: .94, troponin: 3280>pending  CXR: mild interstitial prominence at the lateral lung bases, may represent mild interstitial edema, cardiomegaly.  CTA chest: pending In ED: heparin started. ASA and lasix given. Cardiology consulted. TRH asked to admit.    Review of Systems: As mentioned in the history of present illness. All other systems reviewed and are negative. Past Medical History:  Diagnosis Date   CHF (congestive heart failure) ([place])    History reviewed. No pertinent surgical history. Social History:  reports that he has been smoking cigarettes. He uses smokeless tobacco. He reports that he does not drink alcohol and does not use drugs.  Allergies  Allergen Reactions   Bactrim [Sulfamethoxazole-Trimethoprim] Other (See Comments)    Blood Capillaries Rupture     Family History  Problem Relation Age of Onset   Healthy Mother     Prior to Admission medications   Medication Sig Start Date End Date Taking? Authorizing Provider  acetaminophen (TYLENOL) 500 MG tablet Take 1,000 mg by mouth every 6 (six) hours as needed for moderate pain.   Yes [provider]  albuterol (VENTOLIN HFA) 108 (90 Base) MCG/ACT inhaler Inhale 1-2 puffs into the lungs every 6 (six) hours as needed for wheezing or shortness of breath. 08/14/21  Yes [name], FNP  antiseptic oral rinse (BIOTENE) LIQD  15 mLs by Mouth Rinse route as needed for dry mouth.   Yes [provider]  DOXYCYCLINE PO Take 1 capsule by mouth daily.   Yes [provider]  PROMETHAZINE HCL PO Take 1 capsule by mouth as needed (sleep).   Yes [provider]  benzonatate (TESSALON) 100 MG capsule Take 1 capsule (100 mg total) by mouth every 8 (eight) hours as needed for cough. Patient not taking: Reported on 08/27/2021 08/14/21   [name], FNP    Physical Exam: Vitals:   08/27/21 1300 08/27/21 1445  08/27/21 1500 08/27/21 1600  BP: (!) 129/114 (!) 135/109 (!) 142/102 (!) 141/101  Pulse: (!) 114 (!) 104 (!) 107 (!) 111  Resp: (!) 22 16 19 16   Temp:      TempSrc:      SpO2: 95% 97% 97% 96%   General:  Appears calm and comfortable and is in NAD Eyes:  PERRL, EOMI, normal lids, iris ENT:  grossly normal hearing, lips & tongue, mmm; appropriate dentition Neck:  no LAD, masses or thyromegaly; no carotid bruits, +JVD Cardiovascular:  sinus tachycardia, no m/r/g. No LE edema.  Respiratory:   CTA bilaterally with no wheezes/rales/rhonchi.  Normal respiratory effort. Abdomen:  soft, NT, ND, NABS Back:   normal alignment, no CVAT Skin:  Mass on left breast. Indurated, fixed with nipple indention. 6x4cm at least. Mildly ttp.  Musculoskeletal:  grossly normal tone BUE/BLE, good ROM, no bony abnormality Lower extremity:  No LE edema.  Limited foot exam with no ulcerations.  2+ distal pulses. Psychiatric:  grossly normal mood and affect, speech fluent and appropriate, AOx3 Neurologic:  CN 2-12 grossly intact, moves all extremities in coordinated fashion, sensation intact   Radiological Exams on Admission: Independently reviewed - see discussion in A/P where applicable  DG Chest 2 View  Result Date: 08/27/2021 CLINICAL DATA:  SOB EXAM: CHEST - 2 VIEW COMPARISON:  None. FINDINGS: Mild interstitial prominence at the lateral lung bases. No confluent consolidation. No visible pleural effusions or pneumothorax. Enlarged cardiac silhouette. Multiple suspected remote left rib fractures. IMPRESSION: 1. Mild interstitial prominence at the lateral lung bases, which may represent mild interstitial edema. 2. Cardiomegaly. Electronically Signed   By: Margaretha Sheffield M.D.   On: 08/27/2021 11:36   CT Angio Chest PE W and/or Wo Contrast  Result Date: 08/27/2021 CLINICAL DATA:  Positive D-dimer, cough EXAM: CT ANGIOGRAPHY CHEST WITH CONTRAST TECHNIQUE: Multidetector CT imaging of the chest was performed using  the standard protocol during bolus administration of intravenous contrast. Multiplanar CT image reconstructions and MIPs were obtained to evaluate the vascular anatomy. RADIATION DOSE REDUCTION: This exam was performed according to the departmental dose-optimization program which includes automated exposure control, adjustment of the mA and/or kV according to patient size and/or use of iterative reconstruction technique. CONTRAST:  80mL OMNIPAQUE IOHEXOL 350 MG/ML SOLN COMPARISON:  None. FINDINGS: Cardiovascular: No pulmonary embolism identified. Main pulmonary artery is dilated measuring 3.3 cm in diameter. Heart is enlarged. Small pericardial effusion. Thoracic aorta is normal in course and caliber. Mediastinum/Nodes: Bulky lymphadenopathy in the left axilla with the largest nodes measuring 1.9 x 2 cm and 2.7 x 1.8 cm. Also multiple enlarged mediastinal and bilateral hilar lymph nodes measuring up to 15 mm in short axis in the mediastinum and hila. There is a solid retroareolar left breast mass measuring 3.8 x 3.8 x 3.6 cm with overlying lobulated/nodular skin thickening. Lungs/Pleura: Breathing motion and subsegmental atelectatic changes in the lungs. Small left and trace  right pleural effusions. No focal consolidation or pulmonary nodule identified. No pneumothorax. Upper Abdomen: No acute abnormality. Musculoskeletal: A few old left rib fractures noted. No suspicious bony lesions identified. Review of the MIP images confirms the above findings. IMPRESSION: 1. 3.8 cm left breast mass as described, highly concerning for malignancy. Accompanying bulky left axillary lymphadenopathy as well as mediastinal and hilar adenopathy. Mammography and tissue sampling recommended. 2. No pulmonary embolism identified. 3. Cardiomegaly and small pericardial effusion. 4. Small left and trace right pleural effusions with associated atelectatic changes. 5. Mildly dilated pulmonary artery which may represent pulmonary artery  hypertension. Electronically Signed   By: Ofilia Neas M.D.   On: 08/27/2021 13:41   ECHOCARDIOGRAM COMPLETE  Result Date: 08/27/2021    ECHOCARDIOGRAM REPORT   Patient Name:   Michael Stuart Ishibashi Date of Exam: 08/27/2021 Medical Rec #:  [id number]    Height:       75.0 in Accession #:    [id number]   Weight:       260.0 lb Date of Birth:  1981/05/08     BSA:          2.453 m Patient Age:    55 years     BP:           129/114 mmHg Patient Gender: M            HR:           108 bpm. Exam Location:  Inpatient Procedure: 2D Echo, Color Doppler, Cardiac Doppler and Intracardiac            Opacification Agent Indications:    Elevated troponin.                 NSTEMI.                 Left Breast Mass  History:        Patient has no prior history of Echocardiogram examinations.                 Signs/Symptoms:Shortness of Breath.  Sonographer:    Clayton Lefort RDCS (AE) Referring Phys: [id number] [place]  1. Left ventricular ejection fraction, by estimation, is 20 to 25%. The left ventricle has severely decreased function. The left ventricle demonstrates global hypokinesis. The left ventricular internal cavity size was dilated. Left ventricular diastolic  parameters are consistent with Grade III diastolic dysfunction (restrictive). Elevated left ventricular end-diastolic pressure.  2. Right ventricular systolic function is hyperdynamic. The right ventricular size is mildly enlarged. dilated.  3. Left atrial size was mild to moderately dilated.  4. Right atrial size was dilated.  5. A small pericardial effusion is present. The pericardial effusion is posterior to the left ventricle.  6. The mitral valve is grossly normal. Mild mitral valve regurgitation. No evidence of mitral stenosis.  7. The aortic valve is tricuspid. Aortic valve regurgitation is not visualized. No aortic stenosis is present.  8. There is mild dilatation of the ascending aorta, measuring 40 mm.  9. The inferior vena cava is dilated in size with  >50% respiratory variability, suggesting right atrial pressure of 8 mmHg. Comparison(s): No prior Echocardiogram. Conclusion(s)/Recommendation(s): Findings concerning for either LV thrombus or mass (given his history) measures 2.18 x 1.15cm (image 82), would recommend Cardiac MRI for clarification. FINDINGS  Left Ventricle: Left ventricular ejection fraction, by estimation, is 20 to 25%. The left ventricle has severely decreased function. The left ventricle demonstrates global hypokinesis. Definity contrast agent was given  IV to delineate the left ventricular endocardial borders. The left ventricular internal cavity size was dilated. There is no left ventricular hypertrophy. Left ventricular diastolic parameters are consistent with Grade III diastolic dysfunction (restrictive). Elevated left ventricular end-diastolic pressure. Right Ventricle: The right ventricular size is mildly enlarged. Dilated. Right ventricular systolic function is hyperdynamic. Left Atrium: Left atrial size was mild to moderately dilated. Right Atrium: Right atrial size was dilated. Pericardium: A small pericardial effusion is present. The pericardial effusion is posterior to the left ventricle. Mitral Valve: The mitral valve is grossly normal. Mild mitral valve regurgitation. No evidence of mitral valve stenosis. MV peak gradient, 3.3 mmHg. The mean mitral valve gradient is 1.0 mmHg. Tricuspid Valve: The tricuspid valve is normal in structure. Tricuspid valve regurgitation is trivial. No evidence of tricuspid stenosis. Aortic Valve: The aortic valve is tricuspid. Aortic valve regurgitation is not visualized. No aortic stenosis is present. Aortic valve mean gradient measures 1.0 mmHg. Aortic valve peak gradient measures 2.2 mmHg. Aortic valve area, by VTI measures 3.48 cm. Pulmonic Valve: The pulmonic valve was grossly normal. Pulmonic valve regurgitation is not visualized. No evidence of pulmonic stenosis. Aorta: The aortic root is normal in  size and structure. There is mild dilatation of the ascending aorta, measuring 40 mm. Venous: The inferior vena cava is dilated in size with greater than 50% respiratory variability, suggesting right atrial pressure of 8 mmHg. IAS/Shunts: The atrial septum is grossly normal.  LEFT VENTRICLE PLAX 2D LVIDd:         6.60 cm      Diastology LVIDs:         5.70 cm      LV e' medial:    3.00 cm/s LV PW:         1.18 cm      LV E/e' medial:  20.3 LV IVS:        1.10 cm      LV e' lateral:   6.00 cm/s LVOT diam:     2.50 cm      LV E/e' lateral: 10.2 LV SV:         37 LV SV Index:   15 LVOT Area:     4.91 cm  LV Volumes (MOD) LV vol d, MOD A2C: 262.0 ml LV vol d, MOD A4C: 206.0 ml LV vol s, MOD A2C: 224.0 ml LV vol s, MOD A4C: 146.0 ml LV SV MOD A2C:     38.0 ml LV SV MOD A4C:     206.0 ml LV SV MOD BP:      37.2 ml RIGHT VENTRICLE             IVC RV Basal diam:  3.80 cm     IVC diam: 2.60 cm RV Mid diam:    3.20 cm RV S prime:     10.80 cm/s TAPSE (M-mode): 1.1 cm LEFT ATRIUM              Index        RIGHT ATRIUM           Index LA diam:        4.80 cm  1.96 cm/m   RA Area:     27.00 cm LA Vol (A2C):   111.0 ml 45.25 ml/m  RA Volume:   93.90 ml  38.28 ml/m LA Vol (A4C):   83.2 ml  33.91 ml/m LA Biplane Vol: 99.7 ml  40.64 ml/m  AORTIC VALVE AV Area (Vmax):  4.11 cm AV Area (Vmean):   3.48 cm AV Area (VTI):     3.48 cm AV Vmax:           73.80 cm/s AV Vmean:          51.300 cm/s AV VTI:            0.107 m AV Peak Grad:      2.2 mmHg AV Mean Grad:      1.0 mmHg LVOT Vmax:         61.80 cm/s LVOT Vmean:        36.400 cm/s LVOT VTI:          0.076 m LVOT/AV VTI ratio: 0.71  AORTA Ao Root diam: 3.60 cm Ao Asc diam:  4.00 cm MITRAL VALVE MV Area VTI:  2.15 cm     SHUNTS MV Peak grad: 3.3 mmHg     Systemic VTI:  0.08 m MV Mean grad: 1.0 mmHg     Systemic Diam: 2.50 cm MV Vmax:      0.91 m/s MV Vmean:     53.7 cm/s MV E velocity: 61.00 cm/s MV A velocity: 20.00 cm/s MV E/A ratio:  3.05 Sunit Tolia DO Electronically  signed by Rex Kras DO Signature Date/Time: 08/27/2021/3:34:05 PM    Final    US Abdomen Limited RUQ (LIVER/GB)  Result Date: 08/27/2021 CLINICAL DATA:  Elevated LFTs EXAM: ULTRASOUND ABDOMEN LIMITED RIGHT UPPER QUADRANT COMPARISON:  None. FINDINGS: Gallbladder: No gallstones or wall thickening visualized. No sonographic Murphy sign noted by sonographer. Common bile duct: Diameter: 2.5 mm Liver: No focal lesion identified. Within normal limits in parenchymal echogenicity. Portal vein is patent on color Doppler imaging with normal direction of blood flow towards the liver. Other: None. IMPRESSION: Normal right upper quadrant ultrasound. Electronically Signed   By: Maurine Simmering M.D.   On: 08/27/2021 15:25    EKG: Independently reviewed.  Sinus tachycardia with rate 123; nonspecific ST changes with no evidence of acute ischemia. No previous ekg    Labs on Admission: I have personally reviewed the available labs and imaging studies at the time of the admission.  Pertinent labs:   wbc: 14.9,  BNP: 642,  AST: 44,  ALT: 61,  d-dimer: .94,  Troponin: 3280>pending     Assessment and Plan: Acute CHF (congestive heart failure) ([place]) 41 year old male presenting with shortness of breath, fatigue, orthopnea found to have severely reduced EF on bedside US, elevated BNP and findings on CXR consistent with cardiomegaly and mild interstitial edema confirms new CHF exacerbation. ? Myocarditis vs. Nstemi. Also has large mass on left breast, concerning for malignancy, unsure if could be contributing.  -admit to telemetry -delta troponin pending  -echo-stat-EF of 66-59%, grade 3 diastolic dysfunction, global hypokinesis. Mass seen vs. LV thrombus, likely mass given history.  -strict I/O and daily weights -IV diuresis-40mg  BID  -check magnesium and follow electrolytes -telemetry  -cardiology consulted and following    Elevated troponin Initial troponin 3280>delta pending ekg with no ST elevation ? If  from myocarditis vs nstemi Cardiology following On heparin gtt, received ASA Discussed with Dr. Terri Skains and likely will be going to cath lab   Left breast mass CTA chest shows large mass of left breast Exam consistent with most likely malignancy No family history of breast cancer Can not check diagnostic mmg here, will need outpatient  Needs biopsy, per day team inpatient vs. Outpatient   Elevated liver enzymes -drinks 2-3 beers/night -recent viral URI -?  Poor EF -concern for malignancy of left breast-? Mets -check [place] RUQ and hepatitis panel  -trend   Leukocytosis Likely reactive and prednisone induced No sign of infection at this time Follow fever curve and trend   Tobacco user- (present on admission) Smokes 2-3 cigarettes/day Declines patch     Advance Care Planning:   Code Status: Full Code   Consults: cardiology: Dr. Terri Skains   DVT Prophylaxis: heparin gtt  Family Communication: mother at bedside  Severity of Illness: The appropriate patient status for this patient is INPATIENT. Inpatient status is judged to be reasonable and necessary in order to provide the required intensity of service to ensure the patient's safety. The patient's presenting symptoms, physical exam findings, and initial radiographic and laboratory data in the context of their chronic comorbidities is felt to place them at high risk for further clinical deterioration. Furthermore, it is not anticipated that the patient will be medically stable for discharge from the hospital within 2 midnights of admission.   * I certify that at the point of admission it is my clinical judgment that the patient will require inpatient hospital care spanning beyond 2 midnights from the point of admission due to high intensity of service, high risk for further deterioration and high frequency of surveillance required.*  Author: Orma Flaming, MD 08/27/2021 4:20 PM  For on call review [email or web address].

## 2021-08-27 NOTE — TOC Initial Note (Signed)
Transition of Care [facility]) - Initial/Assessment Note    Patient Details  Name: Michael Stuart MRN: [id number] Date of Birth: 1980/09/03  Transition of Care [facility]) CM/SW Contact:    Verdell Carmine, RN Phone Number: 08/27/2021, 2:58 PM  Clinical Narrative:                 Patient recently dx with CHF. 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 In with URI symptoms. BNP and troponin elevated.  Negative for PR, however CTA reveals a breast mass \\with  adenopathy, this will have to be worked up.  He does have insurance coverage, and will need f/u with heart failure clinic and establish a PCP Cm will follow for needs recommendations, and transitions.  Expected Discharge Plan: [place] Barriers to Discharge: Continued Medical Work up   Patient Goals and CMS Choice        Expected Discharge Plan and Services Expected Discharge Plan: [place]   Discharge Planning Services: CM Consult   Living arrangements for the past 2 months: Single Family Home                                      Prior Living Arrangements/Services Living arrangements for the past 2 months: Single Family Home Lives with:: Spouse Patient language and need for interpreter reviewed:: Yes        Need for Family Participation in Patient Care: Yes (Comment) Care giver support system in place?: Yes (comment)   Criminal Activity/Legal Involvement Pertinent to Current Situation/Hospitalization: No - Comment as needed  Activities of Daily Living      Permission Sought/Granted                  Emotional Assessment       Orientation: : Oriented to Self, Oriented to Place, Oriented to  Time, Oriented to Situation Alcohol / Substance Use: Not Applicable Psych Involvement: No (comment)  Admission diagnosis:  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

## 2021-08-27 NOTE — Assessment & Plan Note (Signed)
Smokes 2-3 cigarettes/day Declines patch

## 2021-08-28 ENCOUNTER — Telehealth: Payer: Self-pay | Admitting: Hematology and Oncology

## 2021-08-28 ENCOUNTER — Encounter (HOSPITAL_COMMUNITY)
Admission: EM | Disposition: A | Discharge status: Home / Self Care | Payer: Self-pay | Source: Home / Self Care | Attending: Cardiology

## 2021-08-28 ENCOUNTER — Encounter (HOSPITAL_COMMUNITY): Payer: Self-pay | Admitting: Cardiology

## 2021-08-28 ENCOUNTER — Other Ambulatory Visit: Payer: Self-pay | Admitting: Oncology

## 2021-08-28 DIAGNOSIS — I5043 Acute on chronic combined systolic (congestive) and diastolic (congestive) heart failure: Secondary | ICD-10-CM | POA: Diagnosis not present

## 2021-08-28 DIAGNOSIS — N6320 Unspecified lump in the left breast, unspecified quadrant: Secondary | ICD-10-CM

## 2021-08-28 HISTORY — PX: RIGHT/LEFT HEART CATH AND CORONARY ANGIOGRAPHY: CATH118266

## 2021-08-28 LAB — COMPREHENSIVE METABOLIC PANEL
ALT: 50 U/L — ABNORMAL HIGH (ref 0–44)
ALT: 50 U/L — ABNORMAL HIGH (ref 0–44)
AST: 34 U/L (ref 15–41)
AST: 35 U/L (ref 15–41)
Albumin: 3 g/dL — ABNORMAL LOW (ref 3.5–5.0)
Albumin: 3.2 g/dL — ABNORMAL LOW (ref 3.5–5.0)
Alkaline Phosphatase: 46 U/L (ref 38–126)
Alkaline Phosphatase: 46 U/L (ref 38–126)
Anion gap: 11 (ref 5–15)
Anion gap: 10 (ref 5–15)
BUN: 15 mg/dL (ref 6–20)
BUN: 14 mg/dL (ref 6–20)
CO2: 24 mmol/L (ref 22–32)
CO2: 27 mmol/L (ref 22–32)
Calcium: 8.5 mg/dL — ABNORMAL LOW (ref 8.9–10.3)
Calcium: 9.2 mg/dL (ref 8.9–10.3)
Chloride: 103 mmol/L (ref 98–111)
Chloride: 101 mmol/L (ref 98–111)
Creatinine, Ser: 1.18 mg/dL (ref 0.61–1.24)
Creatinine, Ser: 1.2 mg/dL (ref 0.61–1.24)
GFR, Estimated: >60 mL/min (ref >60)
GFR, Estimated: >60 mL/min (ref >60)
Glucose, Bld: 105 mg/dL — ABNORMAL HIGH (ref 70–99)
Glucose, Bld: 101 mg/dL — ABNORMAL HIGH (ref 70–99)
Potassium: 4.1 mmol/L (ref 3.5–5.1)
Potassium: 4.5 mmol/L (ref 3.5–5.1)
Sodium: 138 mmol/L (ref 135–145)
Sodium: 138 mmol/L (ref 135–145)
Total Bilirubin: 0.7 mg/dL (ref 0.3–1.2)
Total Bilirubin: 0.8 mg/dL (ref 0.3–1.2)
Total Protein: 6.2 g/dL — ABNORMAL LOW (ref 6.5–8.1)
Total Protein: 6.1 g/dL — ABNORMAL LOW (ref 6.5–8.1)

## 2021-08-28 LAB — CBC
HCT: 46 % (ref 39.0–52.0)
Hemoglobin: 14.9 g/dL (ref 13.0–17.0)
MCH: 30.5 pg (ref 26.0–34.0)
MCHC: 32.4 g/dL (ref 30.0–36.0)
MCV: 94.1 fL (ref 80.0–100.0)
Platelets: 336 10*3/uL (ref 150–400)
RBC: 4.89 MIL/uL (ref 4.22–5.81)
RDW: 14.1 % (ref 11.5–15.5)
WBC: 12.7 10*3/uL — ABNORMAL HIGH (ref 4.0–10.5)
nRBC: 0 % (ref 0.0–0.2)

## 2021-08-28 LAB — RESPIRATORY PANEL BY PCR

## 2021-08-28 LAB — POCT I-STAT 7, (LYTES, BLD GAS, ICA,H+H)
Acid-Base Excess: 2 mmol/L (ref 0.0–2.0)
Bicarbonate: 27.5 mmol/L (ref 20.0–28.0)
Calcium, Ion: 1.18 mmol/L (ref 1.15–1.40)
HCT: 48 % (ref 39.0–52.0)
Hemoglobin: 16.3 g/dL (ref 13.0–17.0)
O2 Saturation: 87 %
Potassium: 3.9 mmol/L (ref 3.5–5.1)
Sodium: 140 mmol/L (ref 135–145)
TCO2: 29 mmol/L (ref 22–32)
pCO2 arterial: 44.8 mmHg (ref 32–48)
pH, Arterial: 7.395 (ref 7.35–7.45)
pO2, Arterial: 53 mmHg — ABNORMAL LOW (ref 83–108)

## 2021-08-28 LAB — POCT I-STAT EG7
Acid-Base Excess: 3 mmol/L — ABNORMAL HIGH (ref 0.0–2.0)
Acid-Base Excess: 4 mmol/L — ABNORMAL HIGH (ref 0.0–2.0)
Bicarbonate: 28.9 mmol/L — ABNORMAL HIGH (ref 20.0–28.0)
Bicarbonate: 29.2 mmol/L — ABNORMAL HIGH (ref 20.0–28.0)
Calcium, Ion: 1.18 mmol/L (ref 1.15–1.40)
Calcium, Ion: 1.18 mmol/L (ref 1.15–1.40)
HCT: 48 % (ref 39.0–52.0)
HCT: 49 % (ref 39.0–52.0)
Hemoglobin: 16.3 g/dL (ref 13.0–17.0)
Hemoglobin: 16.7 g/dL (ref 13.0–17.0)
O2 Saturation: 58 %
O2 Saturation: 59 %
Potassium: 3.9 mmol/L (ref 3.5–5.1)
Potassium: 3.9 mmol/L (ref 3.5–5.1)
Sodium: 141 mmol/L (ref 135–145)
Sodium: 141 mmol/L (ref 135–145)
TCO2: 30 mmol/L (ref 22–32)
TCO2: 31 mmol/L (ref 22–32)
pCO2, Ven: 46.2 mmHg (ref 44–60)
pCO2, Ven: 46.7 mmHg (ref 44–60)
pH, Ven: 7.399 (ref 7.25–7.43)
pH, Ven: 7.409 (ref 7.25–7.43)
pO2, Ven: 30 mmHg — CL (ref 32–45)
pO2, Ven: 31 mmHg — CL (ref 32–45)

## 2021-08-28 LAB — HEPARIN LEVEL (UNFRACTIONATED)
Heparin Unfractionated: 0.13 IU/mL — ABNORMAL LOW (ref 0.30–0.70)
Heparin Unfractionated: 0.93 IU/mL — ABNORMAL HIGH (ref 0.30–0.70)
Heparin Unfractionated: <0.1 IU/mL — ABNORMAL LOW (ref 0.30–0.70)

## 2021-08-28 LAB — LIPID PANEL
Cholesterol: 141 mg/dL (ref 0–200)
HDL: 40 mg/dL — ABNORMAL LOW (ref >40)
LDL Cholesterol: 79 mg/dL (ref 0–99)
Total CHOL/HDL Ratio: 3.5 RATIO
Triglycerides: 109 mg/dL (ref <150)
VLDL: 22 mg/dL (ref 0–40)

## 2021-08-28 SURGERY — RIGHT/LEFT HEART CATH AND CORONARY ANGIOGRAPHY
Anesthesia: LOCAL

## 2021-08-28 MED ORDER — MIDAZOLAM HCL 2 MG/2ML IJ SOLN
INTRAMUSCULAR | Status: AC
  Filled 2021-08-28: qty 2

## 2021-08-28 MED ORDER — IVABRADINE HCL 5 MG PO TABS
5.0000 mg | ORAL_TABLET | Freq: Two times a day (BID) | ORAL | Status: DC
Start: 2021-08-28 — End: 2021-08-31
  Administered 2021-08-28 – 2021-08-31 (×6): 5 mg via ORAL
  Filled 2021-08-28 (×7): qty 1

## 2021-08-28 MED ORDER — SODIUM CHLORIDE 0.9 % IV SOLN: 250.0000 mL | INTRAVENOUS | Status: DC | PRN

## 2021-08-28 MED ORDER — HEPARIN (PORCINE) IN NACL 1000-0.9 UT/500ML-% IV SOLN
INTRAVENOUS | Status: AC
  Filled 2021-08-28: qty 1000

## 2021-08-28 MED ORDER — HEPARIN BOLUS VIA INFUSION
3000.0000 [IU] | Freq: Once | INTRAVENOUS | Status: AC
  Administered 2021-08-28: 3000 [IU] via INTRAVENOUS
  Filled 2021-08-28: qty 3000

## 2021-08-28 MED ORDER — VERAPAMIL HCL 2.5 MG/ML IV SOLN
INTRAVENOUS | Status: DC | PRN
  Administered 2021-08-28: 5 mL via INTRA_ARTERIAL

## 2021-08-28 MED ORDER — LIDOCAINE HCL (PF) 1 % IJ SOLN
INTRAMUSCULAR | Status: AC
  Filled 2021-08-28: qty 30

## 2021-08-28 MED ORDER — HEPARIN SODIUM (PORCINE) 1000 UNIT/ML IJ SOLN
INTRAMUSCULAR | Status: DC | PRN
  Administered 2021-08-28: 6000 [IU] via INTRAVENOUS

## 2021-08-28 MED ORDER — SODIUM CHLORIDE 0.9 % IV SOLN: INTRAVENOUS | Status: DC

## 2021-08-28 MED ORDER — ACETAMINOPHEN 325 MG PO TABS
650.0000 mg | ORAL_TABLET | ORAL | Status: DC | PRN
  Administered 2021-08-28 – 2021-08-30 (×4): 650 mg via ORAL
  Filled 2021-08-28 (×4): qty 2

## 2021-08-28 MED ORDER — MIDAZOLAM HCL 2 MG/2ML IJ SOLN
INTRAMUSCULAR | Status: DC | PRN
  Administered 2021-08-28 (×2): 1 mg via INTRAVENOUS

## 2021-08-28 MED ORDER — ONDANSETRON HCL 4 MG/2ML IJ SOLN
4.0000 mg | Freq: Four times a day (QID) | INTRAMUSCULAR | Status: DC | PRN
Start: 2021-08-28 — End: 2021-08-31

## 2021-08-28 MED ORDER — SPIRONOLACTONE 12.5 MG HALF TABLET
12.5000 mg | ORAL_TABLET | Freq: Every day | ORAL | Status: DC
  Administered 2021-08-28 – 2021-08-29 (×2): 12.5 mg via ORAL
  Filled 2021-08-28 (×2): qty 1

## 2021-08-28 MED ORDER — SACUBITRIL-VALSARTAN 24-26 MG PO TABS
1.0000 | ORAL_TABLET | Freq: Two times a day (BID) | ORAL | Status: DC
Start: 2021-08-28 — End: 2021-08-31
  Administered 2021-08-28 – 2021-08-31 (×7): 1 via ORAL
  Filled 2021-08-28 (×7): qty 1

## 2021-08-28 MED ORDER — ASPIRIN 81 MG PO CHEW
81.0000 mg | CHEWABLE_TABLET | ORAL | Status: AC
  Administered 2021-08-28: 81 mg via ORAL
  Filled 2021-08-28: qty 1

## 2021-08-28 MED ORDER — IOHEXOL 350 MG/ML SOLN
INTRAVENOUS | Status: DC | PRN
  Administered 2021-08-28: 20 mL

## 2021-08-28 MED ORDER — SODIUM CHLORIDE 0.9% FLUSH: 3.0000 mL | INTRAVENOUS | Status: DC | PRN

## 2021-08-28 MED ORDER — ASPIRIN 81 MG PO CHEW: 81.0000 mg | CHEWABLE_TABLET | ORAL | Status: DC

## 2021-08-28 MED ORDER — FENTANYL CITRATE (PF) 100 MCG/2ML IJ SOLN
INTRAMUSCULAR | Status: DC | PRN
  Administered 2021-08-28 (×2): 25 ug via INTRAVENOUS

## 2021-08-28 MED ORDER — FENTANYL CITRATE (PF) 100 MCG/2ML IJ SOLN
INTRAMUSCULAR | Status: AC
  Filled 2021-08-28: qty 2

## 2021-08-28 MED ORDER — VERAPAMIL HCL 2.5 MG/ML IV SOLN
INTRAVENOUS | Status: AC
  Filled 2021-08-28: qty 2

## 2021-08-28 MED ORDER — HEPARIN (PORCINE) IN NACL 1000-0.9 UT/500ML-% IV SOLN
INTRAVENOUS | Status: DC | PRN
Start: 2021-08-28 — End: 2021-08-28
  Administered 2021-08-28 (×2): 500 mL

## 2021-08-28 MED ORDER — LIDOCAINE HCL (PF) 1 % IJ SOLN
INTRAMUSCULAR | Status: DC | PRN
Start: 2021-08-28 — End: 2021-08-28
  Administered 2021-08-28 (×2): 2 mL

## 2021-08-28 MED ORDER — SODIUM CHLORIDE 0.9% FLUSH
3.0000 mL | Freq: Two times a day (BID) | INTRAVENOUS | Status: DC
  Administered 2021-08-28 – 2021-08-31 (×5): 3 mL via INTRAVENOUS

## 2021-08-28 MED ORDER — HEPARIN (PORCINE) 25000 UT/250ML-% IV SOLN
2700.0000 [IU]/h | INTRAVENOUS | Status: DC
Start: 2021-08-28 — End: 2021-08-31
  Administered 2021-08-28: 20:00:00 1900 [IU]/h via INTRAVENOUS
  Administered 2021-08-29: 2300 [IU]/h via INTRAVENOUS
  Administered 2021-08-29: 10:00:00 2200 [IU]/h via INTRAVENOUS
  Administered 2021-08-30: 10:00:00 2400 [IU]/h via INTRAVENOUS
  Filled 2021-08-28 (×6): qty 250

## 2021-08-28 MED ORDER — LABETALOL HCL 5 MG/ML IV SOLN: 10.0000 mg | INTRAVENOUS | Status: AC | PRN

## 2021-08-28 MED ORDER — HEPARIN SODIUM (PORCINE) 1000 UNIT/ML IJ SOLN
INTRAMUSCULAR | Status: AC
  Filled 2021-08-28: qty 10

## 2021-08-28 MED ORDER — HYDRALAZINE HCL 20 MG/ML IJ SOLN: 10.0000 mg | INTRAMUSCULAR | Status: AC | PRN

## 2021-08-28 SURGICAL SUPPLY — 13 items

## 2021-08-28 NOTE — Hospital Course (Addendum)
41 y/o male with no PCP presents for shortness of breath. Has h/o tobacco abuse and HLD.   In ED, troponin 3280, CXR> mild interstitial edema and cardiomegaly. Left breast mass with nipple retraction on exam.  CTA chest > 3.8 cm breast mass, highly concerning for malignancy with bulky adenopathy. Mildlyl elevated LFTs, BNP 642, pulse ox 99% on room air.   Cardiology consulted for acute heart failure.  2D ECHO suggestive of LV thrombus vs mass.

## 2021-08-28 NOTE — Interval H&P Note (Signed)
History and Physical Interval Note:  08/28/2021 8:41 AM  Cathie Hoops  has presented today for surgery, with the diagnosis of NSTEMI.  The various methods of treatment have been discussed with the patient and family. After consideration of risks, benefits and other options for treatment, the patient has consented to  Procedure(s): RIGHT/LEFT HEART CATH AND CORONARY ANGIOGRAPHY (N/A) as a surgical intervention.  The patient's history has been reviewed, patient examined, no change in status, stable for surgery.  I have reviewed the patient's chart and labs.  Questions were answered to the patient's satisfaction.    2012 Appropriate Use Criteria for Diagnostic Catheterization Cardiomyopathies (Right and Left Heart Catheterization OR Right Heart Catheterization Alone With/Without Left Ventriculography and Coronary Angiography) Indication:  Known or suspected cardiomyopathy with or without heart failure A (7) Indication: 93; Score 7   Retal Tonkinson J Thia Olesen

## 2021-08-28 NOTE — Progress Notes (Signed)
Pt back from cath lab, TR band clean dry and intact.   Chrisandra Carota, RN 08/28/2021 10:13 AM

## 2021-08-28 NOTE — Progress Notes (Signed)
Brief oncology note  Ambulatory referral to hematology/oncology has been ordered due to left breast mass.  The new patient coordinator will contact the patient with the date/time of his appointment.  I have also sent a message to our breast navigators to help follow this patient.  Mikey Bussing, DNP, AGPCNP-BC, AOCNP

## 2021-08-28 NOTE — Progress Notes (Addendum)
Heart Failure Navigator Progress Note  Following this hospitalization to assess for Heart & Vascular TOC clinic readiness.   ECHO: 20-25% (NEW)  Navigator available for reassessment of patient. Will plan to interview, educate and scheduled specialty CHF followup in [facility] clinic.   Pricilla Holm, MSN, RN Heart Failure Nurse Navigator [phone]

## 2021-08-28 NOTE — Plan of Care (Signed)
As I have little more to add to this patient's care, I have spoken with Dr Virgina Jock who will kindly take over as attending.

## 2021-08-28 NOTE — Progress Notes (Signed)
ANTICOAGULATION CONSULT NOTE  Pharmacy Consult for heparin Indication: LV thrombus  Allergies  Allergen Reactions   Bactrim [Sulfamethoxazole-Trimethoprim] Other (See Comments)    Blood Capillaries Rupture     Patient Measurements: Height: 6\' 3"  (190.5 cm) Weight: 117.2 kg (258 lb 6.4 oz) IBW/kg (Calculated) : 84.5 Heparin Dosing Weight: 91 kg   Vital Signs: Temp: 97.9 F (36.6 C) (02/20 2023) Temp Source: Oral (02/20 2023) BP: 119/80 (02/20 2023) Pulse Rate: 106 (02/20 2023)  Labs: Recent Labs    08/27/21 1103 08/27/21 1514 08/27/21 1928 08/28/21 0229 08/28/21 0932 08/28/21 0933 08/28/21 0937 08/28/21 1015 08/28/21 1415 08/28/21 2138  HGB 15.5  --   --  14.9 16.7 16.3 16.3  --   --   --   HCT 49.0  --   --  46.0 49.0 48.0 48.0  --   --   --   PLT 367  --   --  336  --   --   --   --   --   --   HEPARINUNFRC  --   --    < > 0.13*  --   --   --  0.93*  --  <0.10*  CREATININE 1.18  --   --  1.18  --   --   --   --  1.20  --   TROPONINIHS 3,280* 1,857*  --   --   --   --   --   --   --   --    < > = values in this interval not displayed.     Estimated Creatinine Clearance: 110.7 mL/min (by C-G formula based on SCr of 1.2 mg/dL).   Medical History: Past Medical History:  Diagnosis Date   CHF (congestive heart failure) (HCC)     Medications:  Medications Prior to Admission  Medication Sig Dispense Refill Last Dose   acetaminophen (TYLENOL) 500 MG tablet Take 1,000 mg by mouth every 6 (six) hours as needed for moderate pain.   08/26/2021   albuterol (VENTOLIN HFA) 108 (90 Base) MCG/ACT inhaler Inhale 1-2 puffs into the lungs every 6 (six) hours as needed for wheezing or shortness of breath. 1 each 0 Past Week   antiseptic oral rinse (BIOTENE) LIQD 15 mLs by Mouth Rinse route as needed for dry mouth.   Past Week   DOXYCYCLINE PO Take 1 capsule by mouth daily.   08/26/2021   PROMETHAZINE HCL PO Take 1 capsule by mouth as needed (sleep).   Past Week   benzonatate  (TESSALON) 100 MG capsule Take 1 capsule (100 mg total) by mouth every 8 (eight) hours as needed for cough. (Patient not taking: Reported on 08/27/2021) 21 capsule 0 Completed Course    Assessment: 8 YOM who presents with SOB and elevated troponins on IV heparin for ACS.   Pt is now status post cath. RN turned heparin off at 0855 this morning, before patient went to cath lab. Last lab drawn is erroneous. Patient got a 6000 unit bolus at 0942 (in cath lab). Level drawn at 1015.  Per RN, TR band removed ~1150 and MD instructed to restart heparin 2 hours post TR band removal.  Initial heparin level s/p cath < 0.10   Goal of Therapy:  Heparin level 0.3-0.7 units/ml Monitor platelets by anticoagulation protocol: Yes   Plan:  Increase heparin to 2200 units / hr Heparin level at 8 am Continue to monitor H&H and platelets  Thank you Anette Guarneri, PharmD 08/28/2021  11:05 PM  **Pharmacist phone directory can be found on [email or web address] listed under [place]**

## 2021-08-28 NOTE — Discharge Planning (Signed)
Oncology Discharge Planning Admission Note  [facility] at [facility] Address: [address], [place], [place] [address] Hours of Operation:  8am - 5pm, Monday - Friday  Clinic Contact Information:  [phone]) [phone]  Oncology Care Team: Medical Oncologist:    Myrtha Mantis, NP is aware of this hospital admission dated 08/27/21.  The cancer center will follow Koren Shiver inpatient care to assist with discharge planning as indicated by the oncologist.  We will reach arrange for outpatient follow up closer to discharge.  Disclaimer:  This [place] note does not imply a formal consult request has been made by the admitting attending for this admission or there will be an inpatient consult completed by oncology.  Please request oncology consults as per standard process as indicated.

## 2021-08-28 NOTE — Progress Notes (Signed)
ANTICOAGULATION CONSULT NOTE - Follow Up Consult  Pharmacy Consult for heparin Indication:  possible LV thrombus and r/o ACS  Labs: Recent Labs    08/27/21 1103 08/27/21 1514 08/27/21 1928 08/28/21 0229  HGB 15.5  --   --  14.9  HCT 49.0  --   --  46.0  PLT 367  --   --  336  HEPARINUNFRC  --   --  <0.10* 0.13*  CREATININE 1.18  --   --   --   TROPONINIHS 3,280* 1,857*  --   --     Assessment: 40yo male subtherapeutic on heparin after rate change; no infusion issues or signs of bleeding per RN.  Goal of Therapy:  Heparin level 0.3-0.7 units/ml   Plan:  Will rebolus with heparin 3000 units and increase heparin infusion by 4 units/kg/hr to 1900 units/hr and check level in 6 hours.    Wynona Neat, PharmD, BCPS  08/28/2021,3:20 AM

## 2021-08-28 NOTE — Progress Notes (Signed)
Progress Note   Patient: Michael Stuart KDT:[id number] DOB: Jul 16, 1980 DOA: 08/27/2021     1 DOS: the patient was seen and examined on 08/28/2021   Brief hospital course: 41 y/o male with no PCP presents for shortness of breath. Has h/o tobacco abuse and HLD.   In ED, troponin 3280, CXR> mild interstitial edema and cardiomegaly. Left breast mass with nipple retraction on exam.  CTA chest > 3.8 cm breast mass, highly concerning for malignancy with bulky adenopathy. Mildlyl elevated LFTs, BNP 642.   Cardiology consulted for NSTEMI.    Assessment and Plan: * Acute on chronic combined systolic and diastolic CHF (congestive heart failure) (HCC) - EF 99-83% grade 2 diastolic dysfunction - on IV Lasix 40 mg Q 12 hrs - meds per cardiology> Entresto, spironolactone, corlanor, farxiga, aspirin 81 mg  - cardiac cath shows normal coronaries  Elevated troponin Initial troponin 3280 and down trending- cardiology feels this is demand ischemia - cath is negative for stenosis    Left breast mass - I have contacted oncology and they have made him a follow up appt  Leukocytosis Likely reactive and prednisone induced No sign of infection at this time   Elevated liver enzymes - [place] RUQ is normal and hepatitis panel is negative - recheck tomorrow  Tobacco user- (present on admission) Smokes 2-3 cigarettes/day Declines patch         Subjective: No complaints  Physical Exam: Vitals:   08/28/21 0948 08/28/21 0953 08/28/21 0958 08/28/21 1100  BP: (!) 128/94 (!) 128/94  105/72  Pulse: 98 (!) 0 (!) 0 96  Resp: 20   18  Temp:    97.8 F (36.6 C)  TempSrc:    Oral  SpO2: 97% 96%  97%  Weight:      Height:       General exam: Appears comfortable  HEENT: PERRLA, oral mucosa moist, no sclera icterus or thrush Respiratory system: Clear to auscultation. Respiratory effort normal. Chest: firm mass under left nipple which is nontender, nipple is retracted Cardiovascular system: S1 & S2  heard, regular rate and rhythm Gastrointestinal system: Abdomen soft, non-tender, nondistended. Normal bowel sounds   Central nervous system: Alert and oriented. No focal neurological deficits. Extremities: No cyanosis, clubbing or edema Skin: No rashes or ulcers Psychiatry:  Mood & affect appropriate.    Data Reviewed:  CMP Latest Ref Rng & Units 08/28/2021 08/27/2021  Glucose 70 - 99 mg/dL 105(H) 101(H)  BUN 6 - 20 mg/dL 15 10  Creatinine 0.61 - 1.24 mg/dL 1.18 1.18  Sodium 135 - 145 mmol/L 138 137  Potassium 3.5 - 5.1 mmol/L 4.1 4.7  Chloride 98 - 111 mmol/L 103 105  CO2 22 - 32 mmol/L 24 22  Calcium 8.9 - 10.3 mg/dL 8.5(L) 8.9  Total Protein 6.5 - 8.1 g/dL 6.2(L) 6.3(L)  Total Bilirubin 0.3 - 1.2 mg/dL 0.7 0.6  Alkaline Phos 38 - 126 U/L 46 47  AST 15 - 41 U/L 34 44(H)  ALT 0 - 44 U/L 50(H) 61(H)   CBC    Component Value Date/Time   WBC 12.7 (H) 08/28/2021 0229   RBC 4.89 08/28/2021 0229   HGB 14.9 08/28/2021 0229   HCT 46.0 08/28/2021 0229   PLT 336 08/28/2021 0229   MCV 94.1 08/28/2021 0229   MCH 30.5 08/28/2021 0229   MCHC 32.4 08/28/2021 0229   RDW 14.1 08/28/2021 0229   LYMPHSABS 2.1 08/27/2021 1103   MONOABS 0.9 08/27/2021 1103   EOSABS 0.1 08/27/2021  1103   BASOSABS 0.1 08/27/2021 1103     Family Communication:    Disposition: Status is: Inpatient Remains inpatient appropriate because: acute heart failure          Planned Discharge Destination: Home     Time spent: 35 minutes  Author: Debbe Odea, MD 08/28/2021 1:54 PM  For on call review [email or web address].

## 2021-08-28 NOTE — Progress Notes (Signed)
Patient and sister informed of [employer] Channel and video's to watch.

## 2021-08-28 NOTE — Progress Notes (Signed)
ANTICOAGULATION CONSULT NOTE  Pharmacy Consult for heparin Indication: chest pain/ACS  Allergies  Allergen Reactions   Bactrim [Sulfamethoxazole-Trimethoprim] Other (See Comments)    Blood Capillaries Rupture     Patient Measurements: Height: 6\' 3"  (190.5 cm) Weight: 117.2 kg (258 lb 6.4 oz) IBW/kg (Calculated) : 84.5 Heparin Dosing Weight: 91 kg   Vital Signs: Temp: 97.8 F (36.6 C) (02/20 1100) Temp Source: Oral (02/20 1100) BP: 105/72 (02/20 1100) Pulse Rate: 96 (02/20 1100)  Labs: Recent Labs    08/27/21 1103 08/27/21 1514 08/27/21 1928 08/28/21 0229 08/28/21 1015  HGB 15.5  --   --  14.9  --   HCT 49.0  --   --  46.0  --   PLT 367  --   --  336  --   HEPARINUNFRC  --   --  <0.10* 0.13* 0.93*  CREATININE 1.18  --   --  1.18  --   TROPONINIHS 3,280* 1,857*  --   --   --      Estimated Creatinine Clearance: 112.6 mL/min (by C-G formula based on SCr of 1.18 mg/dL).   Medical History: Past Medical History:  Diagnosis Date   CHF (congestive heart failure) (HCC)     Medications:  Medications Prior to Admission  Medication Sig Dispense Refill Last Dose   acetaminophen (TYLENOL) 500 MG tablet Take 1,000 mg by mouth every 6 (six) hours as needed for moderate pain.   08/26/2021   albuterol (VENTOLIN HFA) 108 (90 Base) MCG/ACT inhaler Inhale 1-2 puffs into the lungs every 6 (six) hours as needed for wheezing or shortness of breath. 1 each 0 Past Week   antiseptic oral rinse (BIOTENE) LIQD 15 mLs by Mouth Rinse route as needed for dry mouth.   Past Week   DOXYCYCLINE PO Take 1 capsule by mouth daily.   08/26/2021   PROMETHAZINE HCL PO Take 1 capsule by mouth as needed (sleep).   Past Week   benzonatate (TESSALON) 100 MG capsule Take 1 capsule (100 mg total) by mouth every 8 (eight) hours as needed for cough. (Patient not taking: Reported on 08/27/2021) 21 capsule 0 Completed Course    Assessment: 38 YOM who presents with SOB and elevated troponins on IV heparin for  ACS.   Pt is now status post cath. RN turned heparin off at 0855 this morning, before patient went to cath lab. Last lab drawn is erroneous. Patient got a 6000 unit bolus at 0942 (in cath lab). Level drawn at 1015.  Per RN, TR band removed ~1150 and MD instructed to restart heparin 2 hours post TR band removal.   Goal of Therapy:  Heparin level 0.3-0.7 units/ml Monitor platelets by anticoagulation protocol: Yes   Plan:  Restart Heparin 1900 units/hr @1400  (~2 hours post TR band removal) Check heparin level 6 hours after restart and daily while on heparin Continue to monitor H&H and platelets    Thank you for allowing [place] to participate in this patients care. Jens Som, PharmD 08/28/2021 12:48 PM  **Pharmacist phone directory can be found on [email or web address] listed under [place]**

## 2021-08-28 NOTE — Plan of Care (Addendum)
Paged by RN that the patient and family still have questions for me. I saw the patient this AM, examined him and answered his questions. The nurse later sent me a secure chat stating the patient and family have questions for me. I called the room, introduced myself to the patient's mother and asked if I should speak with her or the patient. She replied that I should speak with the patient. I again introduced myself to the patient and answered his questions. I sent the nurse a secure chat stating "I called him" and she saw the secure chat. The only question the patient had was about his heart. I had explained to him on the phone earlier that the cardiologist told me he would speak with the patient about his heart. He does not remember me saying this and then tells me that he thought I was the cardiologist. I again told him that the cardiologist would come speak with him.  Debbe Odea, MD

## 2021-08-28 NOTE — Telephone Encounter (Signed)
Scheduled appt per 2/20 referral. Pt is aware of appt date and time. Pt is aware to arrive 15 mins prior to appt time and to bring and updated insurance card. Pt is aware of appt location.   °

## 2021-08-28 NOTE — Progress Notes (Signed)
Spoke to tech. [employer]. And they will add on Resp. Panel to previous swab done earlier.

## 2021-08-28 NOTE — Assessment & Plan Note (Signed)
-   EF 53-64% grade 2 diastolic dysfunction - on IV Lasix 40 mg Q 12 hrs - meds per cardiology> Entresto, spironolactone, corlanor, farxiga, aspirin 81 mg  - cardiac cath shows normal coronaries

## 2021-08-28 NOTE — Progress Notes (Signed)
TR band removed, VSS, SPO2 good pleth, radial and ulnar artery 2+, no hemtomas on arm  Chrisandra Carota, RN 08/28/2021 11:49 AM

## 2021-08-28 NOTE — Progress Notes (Signed)
Patient is requesting a different attending.   Chrisandra Carota, RN 08/28/2021 4:58 PM

## 2021-08-29 ENCOUNTER — Other Ambulatory Visit (HOSPITAL_COMMUNITY): Payer: Self-pay

## 2021-08-29 ENCOUNTER — Inpatient Hospital Stay (HOSPITAL_COMMUNITY): Payer: BC Managed Care – PPO

## 2021-08-29 ENCOUNTER — Encounter (HOSPITAL_COMMUNITY): Payer: Self-pay | Admitting: Family Medicine

## 2021-08-29 ENCOUNTER — Ambulatory Visit: Payer: BC Managed Care – PPO | Admitting: Physician Assistant

## 2021-08-29 DIAGNOSIS — I5021 Acute systolic (congestive) heart failure: Secondary | ICD-10-CM

## 2021-08-29 LAB — CBC
HCT: 52.6 % — ABNORMAL HIGH (ref 39.0–52.0)
Hemoglobin: 17.9 g/dL — ABNORMAL HIGH (ref 13.0–17.0)
MCH: 31 pg (ref 26.0–34.0)
MCHC: 34 g/dL (ref 30.0–36.0)
MCV: 91 fL (ref 80.0–100.0)
Platelets: 325 10*3/uL (ref 150–400)
RBC: 5.78 MIL/uL (ref 4.22–5.81)
RDW: 13.7 % (ref 11.5–15.5)
WBC: 9 10*3/uL (ref 4.0–10.5)
nRBC: 0 % (ref 0.0–0.2)

## 2021-08-29 LAB — HEPARIN LEVEL (UNFRACTIONATED)
Heparin Unfractionated: 0.38 IU/mL (ref 0.30–0.70)
Heparin Unfractionated: 0.29 IU/mL — ABNORMAL LOW (ref 0.30–0.70)

## 2021-08-29 LAB — PROTIME-INR
INR: 1.1 (ref 0.8–1.2)
Prothrombin Time: 14.6 seconds (ref 11.4–15.2)

## 2021-08-29 IMAGING — MR MR CARD MORPHOLOGY WO/W CM
44 of 48 series · 44 of 48 positions shown · IV contrast (Contrast agent)
Comparison: none

CLINICAL DATA: Cardiomyopathy of uncertain etiology

EXAM:
CARDIAC MRI
TECHNIQUE: The patient was scanned on a 1.5 Tesla GE magnet. A dedicated
cardiac coil was used. Functional imaging was done using [employer]
sequences. [DATE], and 4 chamber views were done to assess for RWMA's.
Modified PESINA rule using a short axis stack was used to
calculate an ejection fraction on a dedicated work station using
Circle software. The patient received 8 cc of Gadavist. After 10
minutes inversion recovery sequences were used to assess for
infiltration and scar tissue.

[Series 4: t2_haste_db_tra_bh · axial · 8.0mm · 1.41mm/px · 1 of 18 slices shown]
[im 1/18]
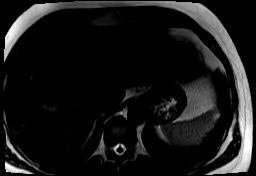

[Series 9: bSSFP · oblique · 8.0mm · 1.83mm/px · 1 of 25 slices shown (1 of 26)]
[im 1/25]
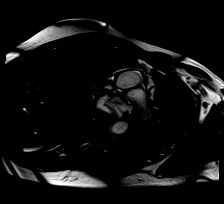

[Series 10: bSSFP · oblique · 8.0mm · 1.83mm/px · 1 of 25 slices shown (2 of 26)]
[im 1/25]
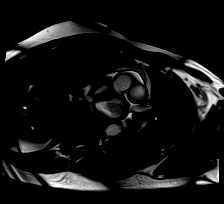

[Series 11: bSSFP · oblique · 8.0mm · 1.83mm/px · 1 of 25 slices shown (3 of 26)]
[im 1/25]
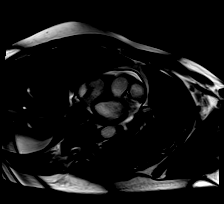

[Series 12: bSSFP · oblique · 8.0mm · 1.83mm/px · 1 of 25 slices shown (4 of 26)]
[im 1/25]
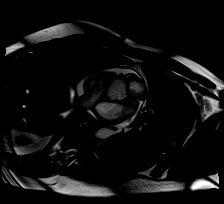

[Series 13: bSSFP · oblique · 8.0mm · 1.83mm/px · 1 of 25 slices shown (5 of 26)]
[im 1/25]
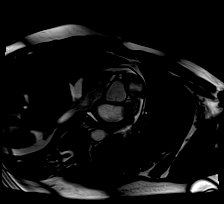

[Series 14: bSSFP · oblique · 8.0mm · 1.83mm/px · 1 of 25 slices shown (6 of 26)]
[im 1/25]
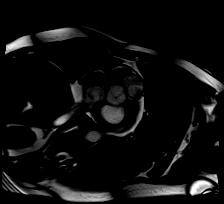

[Series 15: bSSFP · oblique · 8.0mm · 1.83mm/px · 1 of 25 slices shown (7 of 26)]
[im 1/25]
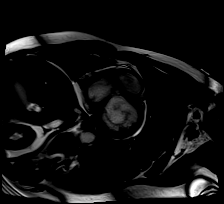

[Series 16: bSSFP · oblique · 8.0mm · 1.83mm/px · 1 of 25 slices shown (8 of 26)]
[im 1/25]
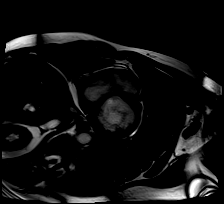

[Series 17: bSSFP · oblique · 8.0mm · 1.83mm/px · 1 of 25 slices shown (9 of 26)]
[im 1/25]
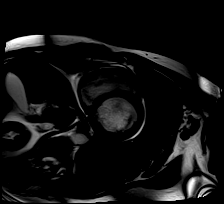

[Series 18: bSSFP · oblique · 8.0mm · 1.83mm/px · 1 of 25 slices shown (10 of 26)]
[im 1/25]
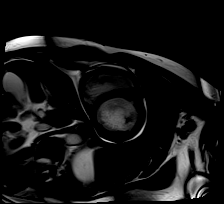

[Series 19: bSSFP · oblique · 8.0mm · 1.83mm/px · 1 of 25 slices shown (11 of 26)]
[im 1/25]
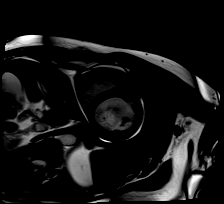

[Series 20: bSSFP · oblique · 8.0mm · 1.83mm/px · 1 of 25 slices shown (12 of 26)]
[im 1/25]
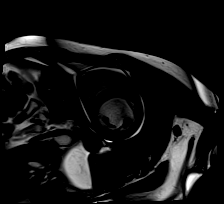

[Series 21: bSSFP · oblique · 8.0mm · 1.83mm/px · 1 of 25 slices shown (13 of 26)]
[im 1/25]
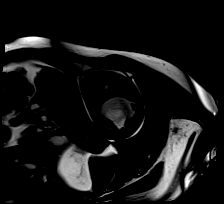

[Series 22: bSSFP · oblique · 8.0mm · 1.83mm/px · 1 of 25 slices shown (14 of 26)]
[im 1/25]
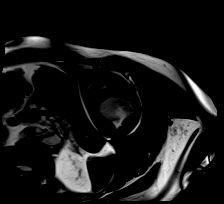

[Series 23: bSSFP · oblique · 8.0mm · 1.83mm/px · 1 of 25 slices shown (15 of 26)]
[im 1/25]
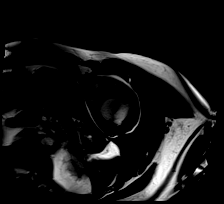

[Series 24: bSSFP · oblique · 8.0mm · 1.83mm/px · 1 of 25 slices shown (16 of 26)]
[im 1/25]
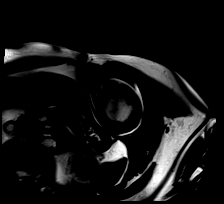

[Series 25: bSSFP · oblique · 8.0mm · 1.83mm/px · 1 of 25 slices shown (17 of 26)]
[im 1/25]
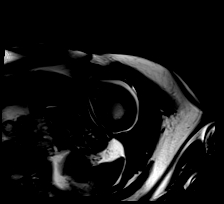

[Series 26: bSSFP · oblique · 8.0mm · 1.83mm/px · 1 of 25 slices shown (18 of 26)]
[im 1/25]
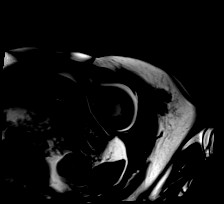

[Series 27: bSSFP · oblique · 8.0mm · 1.83mm/px · 1 of 25 slices shown (19 of 26)]
[im 1/25]
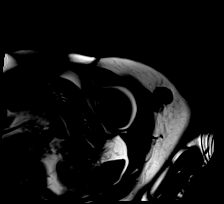

[Series 28: bSSFP · oblique · 8.0mm · 1.83mm/px · 1 of 25 slices shown (20 of 26)]
[im 1/25]
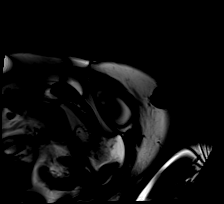

[Series 29: bSSFP · oblique · 8.0mm · 1.83mm/px · 1 of 25 slices shown (21 of 26)]
[im 1/25]
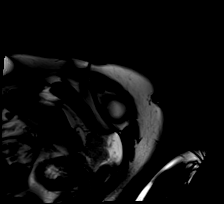

[Series 30: bSSFP · oblique · 8.0mm · 1.83mm/px · 1 of 25 slices shown (22 of 26)]
[im 1/25]
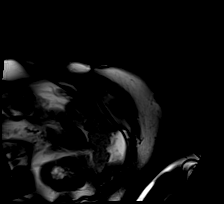

[Series 31: bSSFP · oblique · 8.0mm · 1.83mm/px · 1 of 25 slices shown (23 of 26)]
[im 1/25]
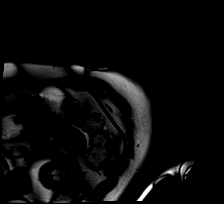

[Series 32: bSSFP · oblique · 6.0mm · 1.41mm/px · 1 of 25 slices shown (24 of 26)]
[im 1/25]
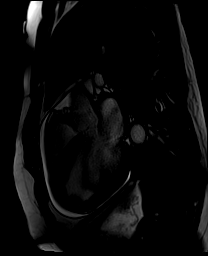

[Series 33: bSSFP · oblique · 6.0mm · 1.64mm/px · 1 of 25 slices shown (25 of 26)]
[im 1/25]
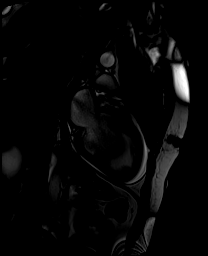

[Series 34: bSSFP · axial · 6.0mm · 1.64mm/px · 1 of 25 slices shown (26 of 26)]
[im 1/25]
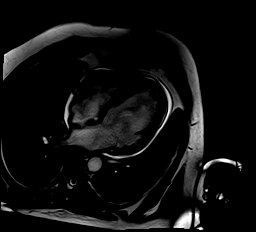

[Series 35: 4ch stack · axial · 8.0mm · 1.83mm/px · 1 of 25 slices shown (1 of 8)]
[im 1/25]
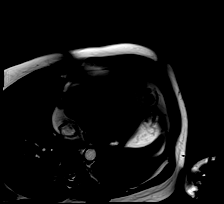

[Series 35: 4ch stack · axial · 8.0mm · 1.83mm/px · 1 of 25 slices shown (2 of 8)]
[im 1/25]
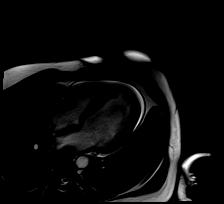

[Series 35: 4ch stack · axial · 8.0mm · 1.83mm/px · 1 of 25 slices shown (3 of 8)]
[im 1/25]
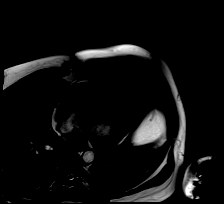

[Series 35: 4ch stack · axial · 8.0mm · 1.83mm/px · 1 of 25 slices shown (4 of 8)]
[im 1/25]
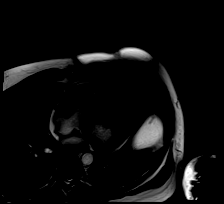

[Series 35: 4ch stack · axial · 8.0mm · 1.83mm/px · 1 of 25 slices shown (5 of 8)]
[im 1/25]
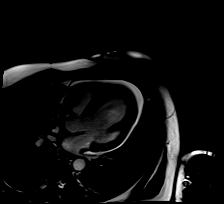

[Series 35: 4ch stack · axial · 8.0mm · 1.83mm/px · 1 of 25 slices shown (6 of 8)]
[im 1/25]
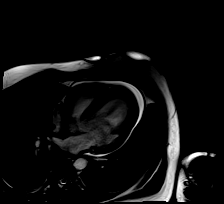

[Series 35: 4ch stack · axial · 8.0mm · 1.83mm/px · 1 of 25 slices shown (7 of 8)]
[im 1/25]
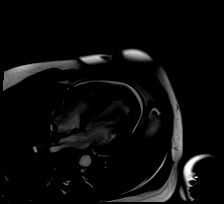

[Series 35: 4ch stack · axial · 8.0mm · 1.83mm/px · 1 of 25 slices shown (8 of 8)]
[im 1/25]
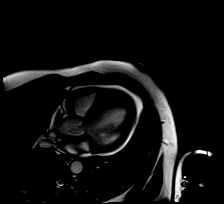

[Series 40: t1_tse_fs db longaxis · axial · 8.0mm · 1.40mm/px · 1 of 1 slices shown]
[im 1/1]
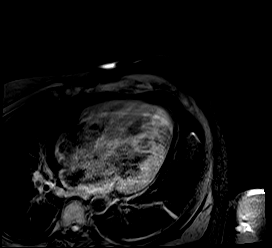

[Series 41: T1 fat-sat · axial · 8.0mm · 1.43mm/px · 1 of 1 slices shown]
[im 1/1]
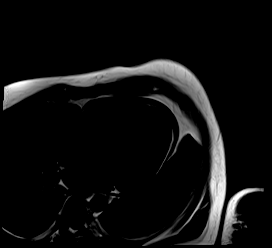

[Series 42: T2 · axial · 8.0mm · 1.58mm/px · 1 of 1 slices shown]
[im 1/1]
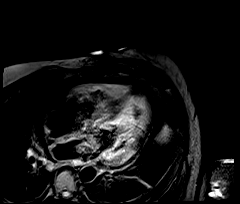

[Series 43: t2_tse_fs db long · axial · 8.0mm · 1.50mm/px · 1 of 1 slices shown]
[im 1/1]
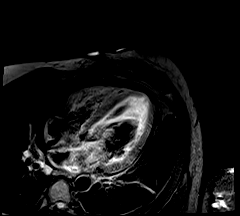

[Series 44: (id)_long_t1 · oblique · 8.0mm · 2.08mm/px · 1 of 24 slices shown]
[im 1/24]
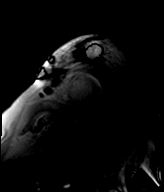

[Series 45: (id)_long_t1_moco · oblique · 8.0mm · 2.08mm/px · 1 of 24 slices shown]
[im 1/24]
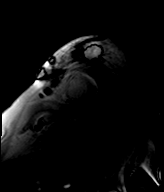

[Series 46: (id)_long_t1_moco_t1 · oblique · 8.0mm · 2.08mm/px · 1 of 6 slices shown]
[im 1/6]
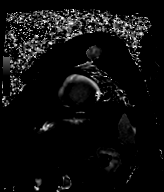

[Series 48: (id)_trufi · oblique · 8.0mm · 2.08mm/px · 1 of 9 slices shown]
[im 1/9]
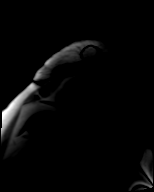

[Series 49: (id)_trufi_moco · oblique · 8.0mm · 2.08mm/px · 1 of 9 slices shown]
[im 1/9]
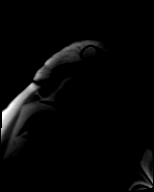

[44 of 48 positions shown; findings below may reference images not displayed]

FINDINGS: Limited images of the lung fields showed no gross abnormalities.
There is a 3.5 x 3 cm left breast mass noted.

Small circumferential pericardial effusion. Severely dilated left
ventricle with mild concentric LV hypertrophy. Severe global
hypokinesis, EF 10%. There was a 2.1 x 1.4 somewhat amorphous LV
apical thrombus. Mildly dilated right ventricle with severe systolic
dysfunction, EF 15%. Mildly dilated left atrium, normal right
atrium. Trileaflet aortic valve with no significant stenosis or
regurgitation. No more than mild mitral regurgitation noted.

Delayed enhancement imaging:

Dense, transmural late gadolinium enhancement (LGE) in the mid
inferolateral wall with what appears to be an area of no reflow
subendocardially.

Subepicardial apical inferior LGE

Subepicardial basal to mid inferoseptal LGE at the inferior RV
insertion site.

MEASUREMENTS:
MEASUREMENTS
LVEDV 345 mL
LVSV 33 mL
LVEF 10%

RVEDV 233 mL
RVSV 35 mL
RVEF 15%

T2 59 apical inferior wall, all other regions < 54

T1 [7C]/ECV 31% septum

T1 [7C]/ECV 38% lateral wall
IMPRESSION: 1.  Left breast mass as seen on prior CT.  Ongoing workup.

2. Severely dilated LV with mild concentric LV hypertrophy. EF 10%
with global hypokinesis.

3. There is an amorphous LV thrombus. The patient is on heparin gtt.

4.  Mildly dilated RV with EF 15%.

5. Delayed enhancement pattern as above. The inferolateral LGE could
be a OM territory infarct, but corresponding lesion not seen by
cath. Overall, most likely acute myocarditis given subepicardial
apical inferior LGE also (the RV insertion site LGE can be
nonspecific in the setting of volume/pressure overload). Elevated T2
in the apical inferior wall is more suggestive of acute myocarditis.
Less likely cardiac sarcoidosis.

Would consider CHF service/clinic followup given profound
biventricular cardiomyopathy in young patient.

PESINA

## 2021-08-29 MED ORDER — WARFARIN SODIUM 10 MG PO TABS
10.0000 mg | ORAL_TABLET | Freq: Once | ORAL | Status: AC
  Administered 2021-08-29: 10 mg via ORAL
  Filled 2021-08-29: qty 1

## 2021-08-29 MED ORDER — FUROSEMIDE 10 MG/ML IJ SOLN
40.0000 mg | Freq: Two times a day (BID) | INTRAMUSCULAR | Status: AC
  Administered 2021-08-29: 40 mg via INTRAVENOUS
  Filled 2021-08-29: qty 4

## 2021-08-29 MED ORDER — DIGOXIN 125 MCG PO TABS
0.1250 mg | ORAL_TABLET | Freq: Every day | ORAL | Status: DC
  Administered 2021-08-29 – 2021-08-31 (×3): 0.125 mg via ORAL
  Filled 2021-08-29 (×3): qty 1

## 2021-08-29 MED ORDER — GADOBUTROL 1 MMOL/ML IV SOLN
10.0000 mL | Freq: Once | INTRAVENOUS | Status: AC | PRN
  Administered 2021-08-29: 10 mL via INTRAVENOUS

## 2021-08-29 MED ORDER — WARFARIN - PHARMACIST DOSING INPATIENT
Freq: Every day | Status: DC
Start: 2021-08-29 — End: 2021-08-31

## 2021-08-29 MED ORDER — SODIUM CHLORIDE 0.9 % IV BOLUS
250.0000 mL | Freq: Once | INTRAVENOUS | Status: DC
Start: 2021-08-29 — End: 2021-08-31

## 2021-08-29 MED ORDER — METOPROLOL SUCCINATE ER 25 MG PO TB24
12.5000 mg | ORAL_TABLET | Freq: Every day | ORAL | Status: DC
  Administered 2021-08-30 – 2021-08-31 (×2): 12.5 mg via ORAL
  Filled 2021-08-29 (×3): qty 1

## 2021-08-29 NOTE — Progress Notes (Signed)
Received information from MRI, patient passed out after laying down for one hour at scan. Rapid response was called to come assess, 100cc bolus was given. Once patient was back on floor VSS, MD notified, no additional orders are needed at the time. Will continue to monitor.   Chrisandra Carota, RN 08/29/2021 11:55 AM

## 2021-08-29 NOTE — Progress Notes (Addendum)
Subjective:  Felt presyncopal and vision changes during and a few hours after brain MRI Patient felt this was following breath holding maneuvers necessary for the MRI NIH scale 0 per RN  Objective:  Vital Signs in the last 24 hours: Temp:  [97.5 F (36.4 C)-98.6 F (37 C)] 97.5 F (36.4 C) (02/21 1952) Pulse Rate:  [89-100] 100 (02/21 1952) Resp:  [15-20] 20 (02/21 1952) BP: (86-113)/(57-82) 112/76 (02/21 1952) SpO2:  [92 %-98 %] 95 % (02/21 1952) Weight:  [115.2 kg] 115.2 kg (02/21 0600)  Intake/Output from previous day: 02/20 0701 - 02/21 0700 In: 1111.6 [P.O.:450; I.V.:661.6] Out: 4875 [Urine:4875]  Physical Exam Vitals and nursing note reviewed.  Constitutional:      General: He is not in acute distress.    Appearance: He is well-developed.  HENT:     Head: Normocephalic and atraumatic.  Eyes:     Conjunctiva/sclera: Conjunctivae normal.     Pupils: Pupils are equal, round, and reactive to light.  Neck:     Vascular: No JVD.  Cardiovascular:     Rate and Rhythm: Normal rate and regular rhythm.     Pulses: Normal pulses and intact distal pulses.     Heart sounds: No murmur heard. Pulmonary:     Effort: Pulmonary effort is normal.     Breath sounds: Normal breath sounds. No wheezing or rales.  Chest:     Chest wall: Mass (Let breast mass, hard to palpation, retracted nipple) present.  Abdominal:     General: Bowel sounds are normal.     Palpations: Abdomen is soft.     Tenderness: There is no rebound.  Musculoskeletal:        General: No tenderness. Normal range of motion.     Right lower leg: No edema.     Left lower leg: No edema.  Lymphadenopathy:     Cervical: No cervical adenopathy.  Skin:    General: Skin is warm and dry.  Neurological:     Mental Status: He is alert and oriented to person, place, and time.     Cranial Nerves: No cranial nerve deficit.     Lab Results: Reviewed and interpreted: CBC  Imaging/tests reviewed and independently  interpreted: Telemetry 08/29/2021: Brief episodes of NSVT  Echocardiogram 08/27/2021:  1. Left ventricular ejection fraction, by estimation, is 20 to 25%. The  left ventricle has severely decreased function. The left ventricle  demonstrates global hypokinesis. The left ventricular internal cavity size  was dilated. Left ventricular diastolic   parameters are consistent with Grade III diastolic dysfunction  (restrictive). Elevated left ventricular end-diastolic pressure.   2. Right ventricular systolic function is hyperdynamic. The right  ventricular size is mildly enlarged. dilated.   3. Left atrial size was mild to moderately dilated.   4. Right atrial size was dilated.   5. A small pericardial effusion is present. The pericardial effusion is  posterior to the left ventricle.   6. The mitral valve is grossly normal. Mild mitral valve regurgitation.  No evidence of mitral stenosis.   7. The aortic valve is tricuspid. Aortic valve regurgitation is not  visualized. No aortic stenosis is present.   8. There is mild dilatation of the ascending aorta, measuring 40 mm.   9. The inferior vena cava is dilated in size with >50% respiratory  variability, suggesting right atrial pressure of 8 mmHg.   Cardiac MRI 08/29/2021: 1.  Left breast mass as seen on prior CT.  Ongoing workup. 2. Severely dilated  LV with mild concentric LV hypertrophy. EF 10% with global hypokinesis. 3. 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 The patient is on heparin gtt. 4.  Mildly dilated RV with EF 15%. 5. Delayed enhancement pattern as above. The inferolateral LGE could be a OM territory infarct, but corresponding lesion not seen by cath. Overall, most likely acute myocarditis given subepicardial apical inferior LGE also (the RV insertion site LGE can be nonspecific in the setting of volume/pressure overload). Elevated T2 in the apical inferior wall is more suggestive of acute myocarditis. Less likely cardiac  sarcoidosis.   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  I discussed the MRI findings at length with heart failure specialist Dr. Aundra Dubin. 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  Other cardiac Studies: LHC/RHC 08/28/2021: Normal coronary arteries without coronary artery disease   RA: 10 mmHg RV: 49/4 mmHg PA: 44/24 mmHg, mPAP 34 mmHg PCW: 22 mmHg   CO: 5.7 L/min CI: 2.3 L/min/m2   Decompensated nonischemic cardiomyopathy Resume IV heparin 2 hours after TR band is off given LV apical thrombus GDMT for heart failure and continued workup for breast mass  EKG 08/27/2021: Sinus tachycardia Possible Left atrial enlargement Right axis deviation Nonspecific T wave abnormality Abnormal ECG No previous ECGs available  Assessment & Recommendations:  41 year old Caucasian male, Chief Financial Officer by profession working with [employer], admitted with several weeks of dyspnea, found to have new diagnosis of systolic heart failure EF 20%, apical LV thrombus, new diagnosis of breast mass with axillary, mediastinal, and hilar lymphadenopathy.  HFrEF: Acute systolic failure Based on MRI, likely etiology is acute myocarditis. H/o viral illness in December 2022 Responding well to diuresis and GDMT. Continue Entresto 24-26 mg bid, spironolactone 12.5 mg daily, Farxiga 10 mg daily, Corlanor 5 mg bid, digoxin 0.125 mg daily Added metoprolol succinate 12.5 mg daily Discussed w/Dr. Aundra Dubin. 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 Goal INR 2.0-2.5  Elevated troponin: Type 2 MI due to HF and myocarditis  Breast mass: With axillary, mediastinal and hilar lymphadenopathy High suspiion for malignancy Outpatient f/u scheduled with Dr. Lindi Adie 09/11/2021  Long discussion with patient and mother at bedside, discussion w/Dr. Aundra Dubin re: MRI  findings and future directions   Anticipate hospital stay for another 2-3 days pending goal INR   Nigel Mormon, MD Pager: [phone] Office: [phone]

## 2021-08-29 NOTE — Plan of Care (Signed)
°  Problem: Education: Goal: Ability to demonstrate management of disease process will improve Outcome: Progressing   Problem: Clinical Measurements: Goal: Cardiovascular complication will be avoided Outcome: Progressing   Problem: Pain Managment: Goal: General experience of comfort will improve Outcome: Progressing   Problem: Safety: Goal: Ability to remain free from injury will improve Outcome: Progressing

## 2021-08-29 NOTE — Progress Notes (Signed)
Patient started to experience same vision changes this am before passing out. NIH 0, VSS, MD paged and orders in.   Chrisandra Carota, RN 08/29/2021 6:56 PM

## 2021-08-29 NOTE — Progress Notes (Signed)
Heart Failure Nurse Navigator Progress Note  PCP: Patient, No Pcp Per (Inactive) PCP-Cardiologist: none  Admission Diagnosis: A/C CHF (NEW) Admitted from: home with mother  Presentation:   Michael Stuart presented 2/19 with increased SOB. Pt returning from cMRI escorted by MRI tech and rapid response nurse as pt had syncopal episode while sitting up after scan. Pt resting in bed, HOB 45 on room air. BP low 90sys, HF 90s NSR, pt speaking in full sentences states he started seeing white and became very hot during last few minutes of the MRI. Feels fine now, able to eat ice and drink. Mother at bedside. Spend majority of time discussing and educating on HF and plan of care.  Endorses smoking heavily 1-2PPD x 12 years, the past 13 years he has decreased to 3-4 cigarettes/day, encouraged cessation. Endorses long term alcohol abuse drinking 2-3 "tall boys" (16 oz) beer per day, everyday x 20 years, encouraged cessation. Smokes marijuana 2-3x/month.  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 States he travelled over 14,000 miles in the month of January. States he eats steak almost daily while traveling, does not eat much fast food. Drinks 4-5 cups of coffee, ~50oz water, and 2-3 beer daily. Reviewed dietary and fluid modifications.  Pt lives with mother as he is currently going through a separation/divorce. States he has been under a lot of stress with home life and depressed about not seeing his 2 young children often.  Reviewed general potential medications with the HF patient booklet. Pt has some concerns regarding his insurance and short term disability. Pending cMRI results.  Explained benefits of Heart & Vascular Transitions of Care Clinic appointment, patient agreeable.   ECHO/ LVEF: 20-25% (NEW)  Clinical Course:  Past Medical History:  Diagnosis Date   CHF (congestive heart failure) (HCC)      Social History   Socioeconomic History   Marital status: Married     Spouse name: Not on file   Number of children: 2   Years of education: Not on file   Highest education level: Bachelor's degree (e.g., BA, AB, BS)  Occupational History   Occupation: crash Insurance underwriter    Employer: [employer]  Tobacco Use   Smoking status: Every Day    Packs/day: 0.25    Years: 25.00    Pack years: 6.25    Types: Cigarettes   Smokeless tobacco: Current   Tobacco comments:    Smoked 2PPD x 10 years. Last 13 years 3-4 cigarettes/day.   Vaping Use   Vaping Use: Some days   Substances: Nicotine  Substance and Sexual Activity   Alcohol use: Yes    Alcohol/week: 21.0 standard drinks    Types: 21 Cans of beer per week    Comment: 2 tall boys per day, everyday x 20years   Drug use: Yes    Frequency: 1.0 times per week    Types: Marijuana    Comment: 3x per month   Sexual activity: Not Currently  Other Topics Concern   Not on file  Social History Narrative   Not on file   Social Determinants of Health   Financial Resource Strain: Low Risk    Difficulty of Paying Living Expenses: Not very hard  Food Insecurity: No Food Insecurity   Worried About Charity fundraiser in the Last Year: Never true   Ran Out of Food in the Last Year: Never true  Transportation Needs: No Transportation Needs   Lack of Transportation (Medical):  No   Lack of Transportation (Non-Medical): No  Physical Activity: Not on file  Stress: Not on file  Social Connections: Not on file    High Risk Criteria for Readmission and/or Poor Patient Outcomes: Heart failure hospital admissions (last 6 months): 1  No Show rate: NA Difficult social situation: YES Demonstrates medication adherence: NA Primary Language: English Literacy level: able to read/write and comprehend. Wears glasses.  Education Assessment and Provision:  Detailed education and instructions provided on heart failure disease management including the following:  Signs and symptoms of Heart Failure When to call the  physician Importance of daily weights Low sodium diet Fluid restriction Medication management Anticipated future follow-up appointments  Patient education given on each of the above topics.  Patient acknowledges understanding via teach back method and acceptance of all instructions.  Education Materials:  "Living Better With Heart Failure" Booklet, HF zone tool, & Daily Weight Tracker Tool.  Patient has scale at home: yes Patient has pill box at home: yes   Barriers of Care:   -new dx -tobacco abuse -alcohol abuse -medication cost -life stressors/depression -fluid indiscretions  Considerations/Referrals:   Referral made to Heart Failure Pharmacist Stewardship: yes, appreciated Referral made to Heart Failure CSW/NCM TOC: yes, appreciated Referral made to Heart & Vascular TOC clinic: yes, 3/6 @ 10AM per pt request---same day new oncology appt at 1pm  Items for Follow-up on DC/TOC: -optimize -continue HF education -smoking cessation -alcohol cessation -fluid/diet modification -coping (divorce) -substance cessation -medication cost   Pricilla Holm, MSN, RN Heart Failure Nurse Navigator [phone]

## 2021-08-29 NOTE — Progress Notes (Signed)
Assistance requested in MRI by tech who states patient had a syncopal episode, went unresponsive. On my arrival to MRI, patient was awake and talking to rapid response RN who was obtaining vital signs and preparing to give a saline bolus. I placed a phone call to Dr. Virgina Jock (answered by [facility]) and explained that the patient was in MRI and had a syncopal episode, BP was 72/54, HR 99. Pt denies chest pain but reported that he had been getting "a lot of lasix". I also explained that this patient had an echocardiogram completed which showed an EF 20-25% so we were going to give a 100cc saline bolus, Ganji agreed. He stated he would relay this information to Dr. Virgina Jock when he was available.

## 2021-08-29 NOTE — TOC CM/SW Note (Signed)
HF TOC CM sent message to attending for length of time for Life Vest, will fax order and progress notes to [facility]. [place], Heart Failure TOC CM [phone]

## 2021-08-29 NOTE — TOC Benefit Eligibility Note (Addendum)
Patient Teacher, English as a foreign language completed.    The patient is currently admitted and upon discharge could be taking Eliquis 5 mg.  Requires Prior Authorization  The patient is currently admitted and upon discharge could be taking Xarelto 20 mg.  Requires Prior Authorization  The patient is currently admitted and upon discharge could be taking Farxiga 10 mg.  Requires Prior Authorization  The patient is currently admitted and upon discharge could be taking Jardiance 10 mg.  Requires Prior Authorization  The patient is currently admitted and upon discharge could be taking Entresto 24-26 mg.  The current 30 day co-pay is, $30.00.   The patient is currently admitted and upon discharge could be taking Corlanor 5 mg.  Product/Service Not Covered  The patient is insured through [place], [place] Patient Advocate Specialist [place] Patient Advocate Team Direct Number: [phone]  Fax: [phone]

## 2021-08-29 NOTE — Consult Note (Signed)
Advanced Heart Failure Team Consult Note   Primary Physician: Patient, No Pcp Per (Inactive) PCP-Cardiologist:  None  Reason for Consultation: CHF  HPI:    Michael Stuart is seen today for evaluation of CHF at the request of Dr. Virgina Jock.   Patient has minimal past medical history.  He smokes 1-3 cigarettes/day and drinks 2-3 beers/day. 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  His father has a history of CAD.   Patient developed URI symptoms just before Christmas.  He did not get tested for COVID-19 or influenza.  He initially with congested with low grade fever.  He feels like he never got completely better.  He took antibiotics.  About 2 wks prior to admission, he developed orthopnea and PND.  He noted not just congestion but also dyspnea walking short distances.  He was seen in an urgent care and CXR was done, showing cardiomegaly. He was sent from there to the ER. He was admitted and thought to be volume overloaded.  Echo was done, showing dilated LV with EF 20-25%.  LHC/RHC was done, showing elevated filling pressures but relatively preserved cardiac index of 2.3.  There was no significant coronary disease. Patient was diuresed and started on GDMT.    He has been feeling better but still feels like his breathing is not back to normal.  He had cardiac MRI done today showing severe LV dilation with EF 10%, LV thrombus, mildly dilated RV with EF 15%.  Delayed enhancement imaging showed inferolateral LGE that could be a OM territory infarct, but corresponding lesion not seen by cath. Overall, most likely acute myocarditis given subepicardial apical inferior LGE also (the RV insertion site LGE can be nonspecific in the setting of volume/pressure overload). Elevated T2 in the apical inferior wall is more suggestive of acute myocarditis. 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    While in the MRI, patient needed to urinate very badly.  He developed a sensation of  warmth and nausea.  When he sat up after he was let out of the scanner, he sat up and then transiently lost consciousness.  He immediately recovered when laid down. No arrhythmias.  He was orthostatic.  SBP currently up to 110s.   Also of note, patient has been noted by imaging to have left breast cancer with axillary lymphadenopathy.    Review of Systems: All systems reviewed and negative except as per HPI.   Home Medications Prior to Admission medications   Medication Sig Start Date End Date Taking? Authorizing Provider  acetaminophen (TYLENOL) 500 MG tablet Take 1,000 mg by mouth every 6 (six) hours as needed for moderate pain.   Yes [provider]  albuterol (VENTOLIN HFA) 108 (90 Base) MCG/ACT inhaler Inhale 1-2 puffs into the lungs every 6 (six) hours as needed for wheezing or shortness of breath. 08/14/21  Yes [place], Hildred Alamin E, FNP  antiseptic oral rinse (BIOTENE) LIQD 15 mLs by Mouth Rinse route as needed for dry mouth.   Yes [provider]  DOXYCYCLINE PO Take 1 capsule by mouth daily.   Yes [provider]  PROMETHAZINE HCL PO Take 1 capsule by mouth as needed (sleep).   Yes [provider]  benzonatate (TESSALON) 100 MG capsule Take 1 capsule (100 mg total) by mouth every 8 (eight) hours as needed for cough. Patient not taking: Reported on 08/27/2021 08/14/21   [name], FNP    Past Medical History: Past  Medical History:  Diagnosis Date   CHF (congestive heart failure) (HCC)     Past Surgical History: Past Surgical History:  Procedure Laterality Date   RIGHT/LEFT HEART CATH AND CORONARY ANGIOGRAPHY N/A 08/28/2021   Procedure: RIGHT/LEFT HEART CATH AND CORONARY ANGIOGRAPHY;  Surgeon: Nigel Mormon, MD;  Location: [facility];  Service: Cardiovascular;  Laterality: N/A;    Family History: Family History  Problem Relation Age of Onset   Healthy Mother     Social History: Social History   Socioeconomic History   Marital  status: Married    Spouse name: Not on file   Number of children: 2   Years of education: Not on file   Highest education level: Bachelor's degree (e.g., BA, AB, BS)  Occupational History   Occupation: crash Insurance underwriter    Employer: [employer]  Tobacco Use   Smoking status: Every Day    Packs/day: 0.25    Years: 25.00    Pack years: 6.25    Types: Cigarettes   Smokeless tobacco: Current   Tobacco comments:    Smoked 2PPD x 10 years. Last 13 years 3-4 cigarettes/day.   Vaping Use   Vaping Use: Some days   Substances: Nicotine  Substance and Sexual Activity   Alcohol use: Yes    Alcohol/week: 21.0 standard drinks    Types: 21 Cans of beer per week    Comment: 2 tall boys per day, everyday x 20years   Drug use: Yes    Frequency: 1.0 times per week    Types: Marijuana    Comment: 3x per month   Sexual activity: Not Currently  Other Topics Concern   Not on file  Social History Narrative   Not on file   Social Determinants of Health   Financial Resource Strain: Low Risk    Difficulty of Paying Living Expenses: Not very hard  Food Insecurity: No Food Insecurity   Worried About Charity fundraiser in the Last Year: Never true   Ran Out of Food in the Last Year: Never true  Transportation Needs: No Transportation Needs   Lack of Transportation (Medical): No   Lack of Transportation (Non-Medical): No  Physical Activity: Not on file  Stress: Not on file  Social Connections: Not on file    Allergies:  Allergies  Allergen Reactions   Bactrim [Sulfamethoxazole-Trimethoprim] Other (See Comments)    Blood Capillaries Rupture     Objective:    Vital Signs:   Temp:  [97.8 F (36.6 C)-98.5 F (36.9 C)] 97.9 F (36.6 C) (02/21 1149) Pulse Rate:  [89-106] 92 (02/21 1149) Resp:  [15-20] 19 (02/21 1149) BP: (86-119)/(57-90) 93/72 (02/21 1149) SpO2:  [92 %-98 %] 92 % (02/21 1149) Weight:  [115.2 kg] 115.2 kg (02/21 0600) Last BM Date : 08/27/21  Weight change: Filed  Weights   08/27/21 1646 08/28/21 0524 08/29/21 0600  Weight: 120.2 kg 117.2 kg 115.2 kg    Intake/Output:   Intake/Output Summary (Last 24 hours) at 08/29/2021 1327 Last data filed at 08/29/2021 1000 Gross per 24 hour  Intake 811.59 ml  Output 3475 ml  Net -2663.41 ml      Physical Exam    General:  Well appearing. No resp difficulty HEENT: normal Neck: supple. JVP . Carotids 2+ bilat; no bruits. No lymphadenopathy or thyromegaly appreciated. Cor: PMI nondisplaced. Regular rate & rhythm. No rubs, gallops or murmurs. Lungs: clear Abdomen: soft, nontender, nondistended. No hepatosplenomegaly. No bruits or masses. Good bowel sounds. Extremities:  no cyanosis, clubbing, rash, edema Neuro: alert & orientedx3, cranial nerves grossly intact. moves all 4 extremities w/o difficulty. Affect pleasant   Telemetry   NSR, 100s (personally reviewed)  EKG    Sinus tachy, right axis deviation  Labs   Basic Metabolic Panel: Recent Labs  Lab 08/27/21 1103 08/27/21 1514 08/28/21 0229 08/28/21 0932 08/28/21 0933 08/28/21 0937 08/28/21 1415  NA 137  --  138 141 140 141 138  K 4.7  --  4.1 3.9 3.9 3.9 4.5  CL 105  --  103  --   --   --  101  CO2 22  --  24  --   --   --  27  GLUCOSE 101*  --  105*  --   --   --  101*  BUN 10  --  15  --   --   --  14  CREATININE 1.18  --  1.18  --   --   --  1.20  CALCIUM 8.9  --  8.5*  --   --   --  9.2  MG 1.8 1.9  --   --   --   --   --     Liver Function Tests: Recent Labs  Lab 08/27/21 1103 08/28/21 0229 08/28/21 1415  AST 44* 34 35  ALT 61* 50* 50*  ALKPHOS 47 46 46  BILITOT 0.6 0.7 0.8  PROT 6.3* 6.2* 6.1*  ALBUMIN 3.4* 3.0* 3.2*   No results for input(s): LIPASE, AMYLASE in the last 168 hours. No results for input(s): AMMONIA in the last 168 hours.  CBC: Recent Labs  Lab 08/27/21 1103 08/28/21 0229 08/28/21 0932 08/28/21 0933 08/28/21 0937 08/29/21 0737  WBC 14.9* 12.7*  --   --   --  9.0  NEUTROABS 11.6*  --   --    --   --   --   HGB 15.5 14.9 16.7 16.3 16.3 17.9*  HCT 49.0 46.0 49.0 48.0 48.0 52.6*  MCV 97.2 94.1  --   --   --  91.0  PLT 367 336  --   --   --  325    Cardiac Enzymes: No results for input(s): CKTOTAL, CKMB, CKMBINDEX, TROPONINI in the last 168 hours.  BNP: BNP (last 3 results) Recent Labs    08/27/21 1125  BNP 642.0*    ProBNP (last 3 results) No results for input(s): PROBNP in the last 8760 hours.   CBG: No results for input(s): GLUCAP in the last 168 hours.  Coagulation Studies: No results for input(s): LABPROT, INR in the last 72 hours.   Imaging   MR CARDIAC MORPHOLOGY W WO CONTRAST  Result Date: 08/29/2021 CLINICAL DATA:  Cardiomyopathy of uncertain etiology EXAM: CARDIAC MRI TECHNIQUE: The patient was scanned on a 1.5 Tesla GE magnet. A dedicated cardiac coil was used. Functional imaging was done using Fiesta sequences. 2,3, and 4 chamber views were done to assess for RWMA's. Modified Simpson's rule using a short axis stack was used to calculate an ejection fraction on a dedicated work Conservation officer, nature. The patient received 8 cc of Gadavist. After 10 minutes inversion recovery sequences were used to assess for infiltration and scar tissue. FINDINGS: Limited images of the lung fields showed no gross abnormalities. There is a 3.5 x 3 cm left breast mass noted. Small circumferential pericardial effusion. Severely dilated left ventricle with mild concentric LV hypertrophy. Severe global hypokinesis, EF 10%. There was a 2.1 x  1.4 somewhat amorphous LV apical thrombus. Mildly dilated right ventricle with severe systolic dysfunction, EF 19%. Mildly dilated left atrium, normal right atrium. Trileaflet aortic valve with no significant stenosis or regurgitation. No more than mild mitral regurgitation noted. Delayed enhancement imaging: Dense, transmural late gadolinium enhancement (LGE) in the mid inferolateral wall with what appears to be an area of no reflow  subendocardially. Subepicardial apical inferior LGE Subepicardial basal to mid inferoseptal LGE at the inferior RV insertion site. MEASUREMENTS: MEASUREMENTS LVEDV 345 mL LVSV 33 mL LVEF 10% RVEDV 233 mL RVSV 35 mL RVEF 15% T2 59 apical inferior wall, all other regions < 54 T1 1068/ECV 31% septum T1 1090/ECV 38% lateral wall IMPRESSION: 1.  Left breast mass as seen on prior CT.  Ongoing workup. 2. Severely dilated LV with mild concentric LV hypertrophy. EF 10% with global hypokinesis. 3. 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 The patient is on heparin gtt. 4.  Mildly dilated RV with EF 15%. 5. Delayed enhancement pattern as above. The inferolateral LGE could be a OM territory infarct, but corresponding lesion not seen by cath. Overall, most likely acute myocarditis given subepicardial apical inferior LGE also (the RV insertion site LGE can be nonspecific in the setting of volume/pressure overload). Elevated T2 in the apical inferior wall is more suggestive of acute myocarditis. 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 Naythan Douthit Electronically Signed   By: Loralie Champagne M.D.   On: 08/29/2021 12:46     Medications:     Current Medications:  dapagliflozin propanediol  10 mg Oral Daily   furosemide  40 mg Intravenous Q12H   ivabradine  5 mg Oral BID WC   sacubitril-valsartan  1 tablet Oral BID   sodium chloride flush  3 mL Intravenous Q12H   sodium chloride flush  3 mL Intravenous Q12H   sodium chloride flush  3 mL Intravenous Q12H   spironolactone  12.5 mg Oral Daily    Infusions:  sodium chloride     sodium chloride     heparin 2,200 Units/hr (08/29/21 0939)     Assessment/Plan   1. Acute systolic CHF: Patient presents with CHF after a viral-type illness. HIV, TSH, COVID-19 test, and UDS were all negative. I do not think he drinks enough ETOH to cause CMP. Patient has no family history of  cardiomyopathy.   LHC/RHC showed no significant CAD, elevated filling pressures and relatively preserved cardiac output. Echo showed EF 20-25%.   Cardiac MRI was done today, showing severe LV dilation with EF 10%, LV thrombus, mildly dilated RV with EF 15%.  Delayed enhancement imaging showed inferolateral LGE that could be a OM territory infarct, but corresponding lesion not seen by cath. Overall, most likely acute myocarditis given subepicardial apical inferior LGE also (the RV insertion site LGE can be nonspecific in the setting of volume/pressure overload). Elevated T2 in the apical inferior wall is more suggestive of acute myocarditis. 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  On exam today, the patient appears very mildly volume overloaded, still with some orthopnea and dyspnea.  - Would give 1 more dose of Lasix 40 mg IV.  BP has recovered from presumed vagal episode at MRI.  - Continue low dose Toprol XL 12.5 mg daily, would not increase for now.  - Continue ivabradine.  - Add digoxin 0.125 daily.  - Continue Entresto 24/26 bid.  - Continue spironolactone 12.5  daily, if BP remains stable will increase tomorrow.  - With profound LV dysfunction and areas of LGE, I think it would be safest for him to wear a Lifevest at discharge.  Will repeat echo at 3 months to see if he needs ICD.   - I had a long discussion with patient and his mother about possible trajectories of his CHF.  2. Syncope: Patient had an episode of non-arrhythmic syncope in the MRI today, I suspect this was vagally-mediated based on history.  BP currently stable. 3. LV thrombus: He is on heparin gtt bridging to therapeutic warfarin.  4. Breast cancer: Noted on left with axillary lymphadenopathy.  This seems unrelated to myocarditis/CHF. He will need close oncology followup as outpatient.   Length of Stay: 2  Loralie Champagne, MD  08/29/2021, 1:27 PM  Advanced Heart Failure  Team Pager [phone] (M-F; 7a - 5p)  Please contact [facility] for night-coverage after hours (4p -7a ) and weekends on [email or web address]

## 2021-08-29 NOTE — Progress Notes (Signed)
Heart Failure Nurse Navigator Progress Note  Attempted to interview patient, currently at cMRI. Mother in room, gave HF booklet. Mother is retired Therapist, sports. Will plan to educate and interview patient this afternoon.   Pricilla Holm, MSN, RN Heart Failure Nurse Navigator [phone]

## 2021-08-29 NOTE — Significant Event (Signed)
Rapid Response Event Note   Reason for Call :  Syncope after sitting up while in MRI. Per staff patient had completed his cardiac MRI. When he sat up on the side of the table he passed out.  He improved quickly after lying flat.  Initial Focused Assessment:  Upon my arrival he is lying on the MRI table, he is alert and oriented.   BP 84/70  HR 90 BP 72/74  HR 91  Dr Einar Gip called by staff.   Heparin infusing     Interventions:  100 cc NS bolus Returned to [place] of Care:     Event Summary:   MD Notified: Einar Gip Call Time: [id number] Arrival Time: 1117 End Time: [place]  Raliegh Ip, RN

## 2021-08-29 NOTE — Progress Notes (Addendum)
ANTICOAGULATION CONSULT NOTE - Initial Consult  Pharmacy Consult for warfarin Indication:  LV Thrombus  Allergies  Allergen Reactions   Bactrim [Sulfamethoxazole-Trimethoprim] Other (See Comments)    Blood Capillaries Rupture     Patient Measurements: Height: 6\' 3"  (190.5 cm) Weight: 115.2 kg (253 lb 15.5 oz) IBW/kg (Calculated) : 84.5 Heparin Dosing Weight: 110 Kg  Vital Signs: Temp: 97.9 F (36.6 C) (02/21 1149) Temp Source: Oral (02/21 1149) BP: 93/72 (02/21 1149) Pulse Rate: 92 (02/21 1149)  Labs: Recent Labs    08/27/21 1103 08/27/21 1514 08/27/21 1928 08/28/21 0229 08/28/21 0932 08/28/21 0933 08/28/21 0937 08/28/21 1015 08/28/21 1415 08/28/21 2138 08/29/21 0737  HGB 15.5  --   --  14.9   < > 16.3 16.3  --   --   --  17.9*  HCT 49.0  --   --  46.0   < > 48.0 48.0  --   --   --  52.6*  PLT 367  --   --  336  --   --   --   --   --   --  325  HEPARINUNFRC  --   --    < > 0.13*  --   --   --  0.93*  --  <0.10* 0.38  CREATININE 1.18  --   --  1.18  --   --   --   --  1.20  --   --   TROPONINIHS 3,280* 1,857*  --   --   --   --   --   --   --   --   --    < > = values in this interval not displayed.    Estimated Creatinine Clearance: 109.8 mL/min (by C-G formula based on SCr of 1.2 mg/dL).   Medical History: Past Medical History:  Diagnosis Date   CHF (congestive heart failure) ([place])      Assessment: 69 YOM who presents with SOB and elevated troponins on IV heparin for ACS. Pt is now status post cath. First level on the low end of therapeutic, no reported s/sx of bleeding. Pharmacy to start warfarin, no anticoagulation PTA. Baseline INR ordered with afternoon heparin level.    Goal of Therapy:  INR 2-3 Monitor platelets by anticoagulation protocol: Yes   Plan:  Warfarin 10mg  PO x1 dose this evening Monitor daily INR and for s/sx of bleeding   Georga Bora, PharmD Clinical Pharmacist 08/29/2021 1:53 PM Please check AMION for all [place]  numbers

## 2021-08-29 NOTE — Progress Notes (Signed)
ANTICOAGULATION CONSULT NOTE  Pharmacy Consult for heparin Indication: LV thrombus  Allergies  Allergen Reactions   Bactrim [Sulfamethoxazole-Trimethoprim] Other (See Comments)    Blood Capillaries Rupture     Patient Measurements: Height: 6\' 3"  (190.5 cm) Weight: 115.2 kg (253 lb 15.5 oz) IBW/kg (Calculated) : 84.5 Heparin Dosing Weight: 91 kg   Vital Signs: Temp: 98.5 F (36.9 C) (02/21 0756) Temp Source: Oral (02/21 0756) BP: 86/70 (02/21 0756) Pulse Rate: 90 (02/21 0756)  Labs: Recent Labs    08/27/21 1103 08/27/21 1514 08/27/21 1928 08/28/21 0229 08/28/21 0932 08/28/21 0933 08/28/21 0937 08/28/21 1015 08/28/21 1415 08/28/21 2138 08/29/21 0737  HGB 15.5  --   --  14.9   < > 16.3 16.3  --   --   --  17.9*  HCT 49.0  --   --  46.0   < > 48.0 48.0  --   --   --  52.6*  PLT 367  --   --  336  --   --   --   --   --   --  325  HEPARINUNFRC  --   --    < > 0.13*  --   --   --  0.93*  --  <0.10* 0.38  CREATININE 1.18  --   --  1.18  --   --   --   --  1.20  --   --   TROPONINIHS 3,280* 1,857*  --   --   --   --   --   --   --   --   --    < > = values in this interval not displayed.     Estimated Creatinine Clearance: 109.8 mL/min (by C-G formula based on SCr of 1.2 mg/dL).   Medical History: Past Medical History:  Diagnosis Date   CHF (congestive heart failure) (HCC)     Medications:  Medications Prior to Admission  Medication Sig Dispense Refill Last Dose   acetaminophen (TYLENOL) 500 MG tablet Take 1,000 mg by mouth every 6 (six) hours as needed for moderate pain.   08/26/2021   albuterol (VENTOLIN HFA) 108 (90 Base) MCG/ACT inhaler Inhale 1-2 puffs into the lungs every 6 (six) hours as needed for wheezing or shortness of breath. 1 each 0 Past Week   antiseptic oral rinse (BIOTENE) LIQD 15 mLs by Mouth Rinse route as needed for dry mouth.   Past Week   DOXYCYCLINE PO Take 1 capsule by mouth daily.   08/26/2021   PROMETHAZINE HCL PO Take 1 capsule by mouth  as needed (sleep).   Past Week   benzonatate (TESSALON) 100 MG capsule Take 1 capsule (100 mg total) by mouth every 8 (eight) hours as needed for cough. (Patient not taking: Reported on 08/27/2021) 21 capsule 0 Completed Course    Assessment: Michael Stuart who presents with SOB and elevated troponins on IV heparin for ACS.   Pt is now status post cath. First level on the low end of therapeutic, no reported s/sx of bleeding.   Goal of Therapy:  Heparin level 0.3-0.7 units/ml Monitor platelets by anticoagulation protocol: Yes   Plan:  Continue heparin at 2200 units / hr Heparin level in 8 hours then daily Continue to monitor H&H and platelets  Georga Bora, PharmD Clinical Pharmacist 08/29/2021 10:05 AM Please check AMION for all [place] numbers

## 2021-08-29 NOTE — Progress Notes (Signed)
ANTICOAGULATION CONSULT NOTE  Pharmacy Consult for heparin Indication: LV thrombus  Allergies  Allergen Reactions   Bactrim [Sulfamethoxazole-Trimethoprim] Other (See Comments)    Blood Capillaries Rupture     Patient Measurements: Height: 6\' 3"  (190.5 cm) Weight: 115.2 kg (253 lb 15.5 oz) IBW/kg (Calculated) : 84.5 Heparin Dosing Weight: 91 kg   Vital Signs: Temp: 98.6 F (37 C) (02/21 1504) Temp Source: Oral (02/21 1504) BP: 94/74 (02/21 1504) Pulse Rate: 90 (02/21 1504)  Labs: Recent Labs    08/27/21 1103 08/27/21 1514 08/27/21 1928 08/28/21 0229 08/28/21 0932 08/28/21 0933 08/28/21 0937 08/28/21 1015 08/28/21 1415 08/28/21 2138 08/29/21 0737 08/29/21 1551  HGB 15.5  --   --  14.9   < > 16.3 16.3  --   --   --  17.9*  --   HCT 49.0  --   --  46.0   < > 48.0 48.0  --   --   --  52.6*  --   PLT 367  --   --  336  --   --   --   --   --   --  325  --   LABPROT  --   --   --   --   --   --   --   --   --   --   --  14.6  INR  --   --   --   --   --   --   --   --   --   --   --  1.1  HEPARINUNFRC  --   --    < > 0.13*  --   --   --    < >  --  <0.10* 0.38 0.29*  CREATININE 1.18  --   --  1.18  --   --   --   --  1.20  --   --   --   TROPONINIHS 3,280* 1,857*  --   --   --   --   --   --   --   --   --   --    < > = values in this interval not displayed.     Estimated Creatinine Clearance: 109.8 mL/min (by C-G formula based on SCr of 1.2 mg/dL).   Assessment: 35 YOM who presents with SOB and elevated troponins on IV heparin for ACS.  Also found to have a LV thrombus.  Heparin level slightly low at 0.29 units/mL.  RN reports that patient turned off the heparin infusion for an hour around 9-10 AM today, so this level is inaccurate.  Given that previous heparin level is low normal, will increase heparin minimally.  No bleeding nor hematoma reported.  Goal of Therapy:  Heparin level 0.3-0.7 units/ml Monitor platelets by anticoagulation protocol: Yes   Plan:   Increase heparin infusion slightly to 2300 units/hr F/U AM labs  Danie Hannig D. Mina Marble, PharmD, BCPS, [place] 08/29/2021, 4:26 PM

## 2021-08-29 NOTE — TOC Benefit Eligibility Note (Signed)
Patient Advocate Encounter  Prior Authorization for Farxiga 10 mg has been approved.    PA# [id number] Effective dates: 08/29/2021 through 08/29/2022  Patients co-pay is $30.00.     Lyndel Safe, [place] Patient Advocate Specialist [place] Patient Advocate Team Direct Number: [phone]  Fax: [phone]

## 2021-08-30 ENCOUNTER — Other Ambulatory Visit (HOSPITAL_COMMUNITY): Payer: Self-pay

## 2021-08-30 DIAGNOSIS — I5022 Chronic systolic (congestive) heart failure: Secondary | ICD-10-CM

## 2021-08-30 DIAGNOSIS — I5021 Acute systolic (congestive) heart failure: Principal | ICD-10-CM

## 2021-08-30 LAB — BASIC METABOLIC PANEL
Anion gap: 11 (ref 5–15)
BUN: 10 mg/dL (ref 6–20)
CO2: 22 mmol/L (ref 22–32)
Calcium: 8.7 mg/dL — ABNORMAL LOW (ref 8.9–10.3)
Chloride: 101 mmol/L (ref 98–111)
Creatinine, Ser: 0.96 mg/dL (ref 0.61–1.24)
GFR, Estimated: >60 mL/min (ref >60)
Glucose, Bld: 108 mg/dL — ABNORMAL HIGH (ref 70–99)
Potassium: 3.9 mmol/L (ref 3.5–5.1)
Sodium: 134 mmol/L — ABNORMAL LOW (ref 135–145)

## 2021-08-30 LAB — HEPARIN LEVEL (UNFRACTIONATED): Heparin Unfractionated: 0.32 IU/mL (ref 0.30–0.70)

## 2021-08-30 LAB — PROTIME-INR
INR: 1.1 (ref 0.8–1.2)
Prothrombin Time: 14.1 seconds (ref 11.4–15.2)

## 2021-08-30 LAB — CBC
HCT: 53.2 % — ABNORMAL HIGH (ref 39.0–52.0)
Hemoglobin: 17.4 g/dL — ABNORMAL HIGH (ref 13.0–17.0)
MCH: 30 pg (ref 26.0–34.0)
MCHC: 32.7 g/dL (ref 30.0–36.0)
MCV: 91.7 fL (ref 80.0–100.0)
Platelets: 311 10*3/uL (ref 150–400)
RBC: 5.8 MIL/uL (ref 4.22–5.81)
RDW: 13.7 % (ref 11.5–15.5)
WBC: 8.8 10*3/uL (ref 4.0–10.5)
nRBC: 0 % (ref 0.0–0.2)

## 2021-08-30 MED ORDER — SPIRONOLACTONE 25 MG PO TABS
25.0000 mg | ORAL_TABLET | Freq: Every day | ORAL | Status: DC
  Administered 2021-08-30: 25 mg via ORAL
  Filled 2021-08-30: qty 1

## 2021-08-30 MED ORDER — EPLERENONE 25 MG PO TABS
50.0000 mg | ORAL_TABLET | Freq: Every day | ORAL | Status: DC
  Administered 2021-08-31: 50 mg via ORAL
  Filled 2021-08-30: qty 2

## 2021-08-30 MED ORDER — WARFARIN SODIUM 10 MG PO TABS
10.0000 mg | ORAL_TABLET | Freq: Once | ORAL | Status: AC
  Administered 2021-08-30: 10 mg via ORAL
  Filled 2021-08-30: qty 1

## 2021-08-30 NOTE — Progress Notes (Addendum)
Patient ID: Michael Stuart, male   DOB: Feb 24, 1981, 41 y.o.   MRN: [id number]     Advanced Heart Failure Rounding Note  PCP-Cardiologist: None   Subjective:    Feels better overall, able to sleep laying back flat.  No dizziness overnight, BP stable today.   INR remains 1.1 on heparin gtt + warfarin.    Objective:   Weight Range: 115.2 kg Body mass index is 31.74 kg/m.   Vital Signs:   Temp:  [97.5 F (36.4 C)-98.7 F (37.1 C)] 97.8 F (36.6 C) (02/22 0809) Pulse Rate:  [90-100] 95 (02/22 0809) Resp:  [14-20] 18 (02/22 0809) BP: (93-130)/(57-87) 122/87 (02/22 0809) SpO2:  [90 %-95 %] 93 % (02/22 0809) Last BM Date : 08/29/21  Weight change: Filed Weights   08/27/21 1646 08/28/21 0524 08/29/21 0600  Weight: 120.2 kg 117.2 kg 115.2 kg    Intake/Output:   Intake/Output Summary (Last 24 hours) at 08/30/2021 0829 Last data filed at 08/30/2021 0359 Gross per 24 hour  Intake 623.21 ml  Output 2000 ml  Net -1376.79 ml      Physical Exam    General:  Well appearing. No resp difficulty HEENT: Normal Neck: Supple. JVP not elevated. Carotids 2+ bilat; no bruits. No lymphadenopathy or thyromegaly appreciated. Cor: PMI nondisplaced. Regular rate & rhythm. No rubs, gallops or murmurs. Lungs: Clear Abdomen: Soft, nontender, nondistended. No hepatosplenomegaly. No bruits or masses. Good bowel sounds. Extremities: No cyanosis, clubbing, rash, edema Neuro: Alert & orientedx3, cranial nerves grossly intact. moves all 4 extremities w/o difficulty. Affect pleasant   Telemetry   NSR 100s, personally reviewed  Labs    CBC Recent Labs    08/27/21 1103 08/28/21 0229 08/29/21 0737 08/30/21 0159  WBC 14.9*   < > 9.0 8.8  NEUTROABS 11.6*  --   --   --   HGB 15.5   < > 17.9* 17.4*  HCT 49.0   < > 52.6* 53.2*  MCV 97.2   < > 91.0 91.7  PLT 367   < > 325 311   < > = values in this interval not displayed.   Basic Metabolic Panel Recent Labs    08/27/21 1103 08/27/21 1514  08/28/21 0229 08/28/21 1415 08/30/21 0159  NA 137  --    < > 138 134*  K 4.7  --    < > 4.5 3.9  CL 105  --    < > 101 101  CO2 22  --    < > 27 22  GLUCOSE 101*  --    < > 101* 108*  BUN 10  --    < > 14 10  CREATININE 1.18  --    < > 1.20 0.96  CALCIUM 8.9  --    < > 9.2 8.7*  MG 1.8 1.9  --   --   --    < > = values in this interval not displayed.   Liver Function Tests Recent Labs    08/28/21 0229 08/28/21 1415  AST 34 35  ALT 50* 50*  ALKPHOS 46 46  BILITOT 0.7 0.8  PROT 6.2* 6.1*  ALBUMIN 3.0* 3.2*   No results for input(s): LIPASE, AMYLASE in the last 72 hours. Cardiac Enzymes No results for input(s): CKTOTAL, CKMB, CKMBINDEX, TROPONINI in the last 72 hours.  BNP: BNP (last 3 results) Recent Labs    08/27/21 1125  BNP 642.0*    ProBNP (last 3 results) No results for input(s): PROBNP  in the last 8760 hours.   D-Dimer Recent Labs    08/27/21 1103  DDIMER 0.94*   Hemoglobin A1C Recent Labs    08/27/21 1246  HGBA1C 5.6   Fasting Lipid Panel Recent Labs    08/28/21 0229  CHOL 141  HDL 40*  LDLCALC 79  TRIG 109  CHOLHDL 3.5   Thyroid Function Tests Recent Labs    08/27/21 1513  TSH 0.389    Other results:   Imaging    MR CARDIAC MORPHOLOGY W WO CONTRAST  Result Date: 08/29/2021 CLINICAL DATA:  Cardiomyopathy of uncertain etiology EXAM: CARDIAC MRI TECHNIQUE: The patient was scanned on a 1.5 Tesla GE magnet. A dedicated cardiac coil was used. Functional imaging was done using Fiesta sequences. 2,3, and 4 chamber views were done to assess for RWMA's. Modified Simpson's rule using a short axis stack was used to calculate an ejection fraction on a dedicated work Conservation officer, nature. The patient received 8 cc of Gadavist. After 10 minutes inversion recovery sequences were used to assess for infiltration and scar tissue. FINDINGS: Limited images of the lung fields showed no gross abnormalities. There is a 3.5 x 3 cm left breast mass  noted. Small circumferential pericardial effusion. Severely dilated left ventricle with mild concentric LV hypertrophy. Severe global hypokinesis, EF 10%. There was a 2.1 x 1.4 somewhat amorphous LV apical thrombus. Mildly dilated right ventricle with severe systolic dysfunction, EF 16%. Mildly dilated left atrium, normal right atrium. Trileaflet aortic valve with no significant stenosis or regurgitation. No more than mild mitral regurgitation noted. Delayed enhancement imaging: Dense, transmural late gadolinium enhancement (LGE) in the mid inferolateral wall with what appears to be an area of no reflow subendocardially. Subepicardial apical inferior LGE Subepicardial basal to mid inferoseptal LGE at the inferior RV insertion site. MEASUREMENTS: MEASUREMENTS LVEDV 345 mL LVSV 33 mL LVEF 10% RVEDV 233 mL RVSV 35 mL RVEF 15% T2 59 apical inferior wall, all other regions < 54 T1 1068/ECV 31% septum T1 1090/ECV 38% lateral wall IMPRESSION: 1.  Left breast mass as seen on prior CT.  Ongoing workup. 2. Severely dilated LV with mild concentric LV hypertrophy. EF 10% with global hypokinesis. 3. 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 The patient is on heparin gtt. 4.  Mildly dilated RV with EF 15%. 5. Delayed enhancement pattern as above. The inferolateral LGE could be a OM territory infarct, but corresponding lesion not seen by cath. Overall, most likely acute myocarditis given subepicardial apical inferior LGE also (the RV insertion site LGE can be nonspecific in the setting of volume/pressure overload). Elevated T2 in the apical inferior wall is more suggestive of acute myocarditis. 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 Torra Pala Electronically Signed   By: Loralie Champagne M.D.   On: 08/29/2021 12:46     Medications:     Scheduled Medications:  dapagliflozin propanediol  10 mg Oral Daily   digoxin  0.125 mg Oral  Daily   ivabradine  5 mg Oral BID WC   metoprolol succinate  12.5 mg Oral Daily   sacubitril-valsartan  1 tablet Oral BID   sodium chloride flush  3 mL Intravenous Q12H   sodium chloride flush  3 mL Intravenous Q12H   sodium chloride flush  3 mL Intravenous Q12H   spironolactone  25 mg Oral Daily   Warfarin - Pharmacist Dosing Inpatient   Does not apply q1600    Infusions:  sodium  chloride     sodium chloride     heparin 2,300 Units/hr (08/29/21 2224)   sodium chloride      PRN Medications: sodium chloride, sodium chloride, acetaminophen, antiseptic oral rinse, ondansetron (ZOFRAN) IV, sodium chloride flush, sodium chloride flush, zolpidem   Assessment/Plan   1. Acute systolic CHF: Patient presents with CHF after a viral-type illness. HIV, TSH, COVID-19 test, and UDS were all negative. I do not think he drinks enough ETOH to cause CMP. Patient has no family history of cardiomyopathy.   LHC/RHC showed no significant CAD, elevated filling pressures and relatively preserved cardiac output. Echo showed EF 20-25%.   Cardiac MRI was done today, showing severe LV dilation with EF 10%, LV thrombus, mildly dilated RV with EF 15%.  Delayed enhancement imaging showed inferolateral LGE that could be a OM territory infarct, but corresponding lesion not seen by cath. Overall, most likely acute myocarditis given subepicardial apical inferior LGE also (the RV insertion site LGE can be nonspecific in the setting of volume/pressure overload). Elevated T2 in the apical inferior wall is more suggestive of acute myocarditis. 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  Patient looks near euvolemic currently, orthopnea has resolved.  - Hold off on Lasix today, follow clinically.  - Continue low dose Toprol XL 12.5 mg daily, would not increase for now.  - Continue ivabradine.  - Continue digoxin 0.125 daily.  - Continue Entresto 24/26 bid.  - With likely  breast, cancer, will switch spironolactone over to eplerenone.  - With profound LV dysfunction and areas of LGE, I think it would be safest for him to wear a Lifevest at discharge.  Will repeat echo at 3 months to see if he needs ICD.   2. Syncope: Patient had an episode of non-arrhythmic syncope in the MRI yesterday, I suspect this was vagally-mediated based on history.  BP currently stable. 3. LV thrombus: He is on heparin gtt bridging to therapeutic warfarin.  Would like to see his INR start to trend up, then can consider home with Lovenox bridge.  4. Breast mass:  Noted on left by imaging with axillary lymphadenopathy, suspect breast cancer.  This seems unrelated to myocarditis/CHF. He will need close oncology followup as outpatient.   Mobilize today, walk in hall.   Length of Stay: 3  Loralie Champagne, MD  08/30/2021, 8:29 AM  Advanced Heart Failure Team Pager [phone] (M-F; 7a - 5p)  Please contact [facility] for night-coverage after hours (5p -7a ) and weekends on [email or web address]

## 2021-08-30 NOTE — Progress Notes (Signed)
ANTICOAGULATION CONSULT NOTE  Pharmacy Consult for heparin Indication: LV thrombus  Allergies  Allergen Reactions   Bactrim [Sulfamethoxazole-Trimethoprim] Other (See Comments)    Blood Capillaries Rupture     Patient Measurements: Height: 6\' 3"  (190.5 cm) Weight: 115.2 kg (253 lb 15.5 oz) IBW/kg (Calculated) : 84.5 Heparin Dosing Weight: 91 kg   Vital Signs: Temp: 97.8 F (36.6 C) (02/22 0809) Temp Source: Oral (02/22 0809) BP: 122/87 (02/22 0809) Pulse Rate: 95 (02/22 0809)  Labs: Recent Labs    08/27/21 1103 08/27/21 1514 08/27/21 1928 08/28/21 0229 08/28/21 0932 08/28/21 0937 08/28/21 1015 08/28/21 1415 08/28/21 2138 08/29/21 0737 08/29/21 1551 08/30/21 0159  HGB 15.5  --   --  14.9   < > 16.3  --   --   --  17.9*  --  17.4*  HCT 49.0  --   --  46.0   < > 48.0  --   --   --  52.6*  --  53.2*  PLT 367  --   --  336  --   --   --   --   --  325  --  311  LABPROT  --   --   --   --   --   --   --   --   --   --  14.6 14.1  INR  --   --   --   --   --   --   --   --   --   --  1.1 1.1  HEPARINUNFRC  --   --    < > 0.13*  --   --    < >  --    < > 0.38 0.29* 0.32  CREATININE 1.18  --   --  1.18  --   --   --  1.20  --   --   --  0.96  TROPONINIHS 3,280* 1,857*  --   --   --   --   --   --   --   --   --   --    < > = values in this interval not displayed.     Estimated Creatinine Clearance: 137.2 mL/min (by C-G formula based on SCr of 0.96 mg/dL).   Assessment: 46 YOM who presents with SOB and elevated troponins on IV heparin for ACS.  Also found to have a LV thrombus.  Heparin level at low end of goal at 0.32 units/mL. No bleeding nor hematoma reported. CBC stable. INR normal at 1.1, warfarin started last night.   Goal of Therapy:  Heparin level 0.3-0.7 units/ml Monitor platelets by anticoagulation protocol: Yes   Plan:  Increase heparin infusion slightly to 2400 units/hr Warfarin 10mg  again tonight F/U AM labs  Erin Hearing PharmD., BCPS Clinical  Pharmacist 08/30/2021 9:28 AM

## 2021-08-30 NOTE — TOC Benefit Eligibility Note (Signed)
Patient Advocate Encounter  Prior Authorization for Corlanor 5 mg tablets has been approved.    PA# [id number] Effective dates: 08/30/2021 through 08/30/2022  Patients co-pay is $50.00.     Lyndel Safe, [place] Patient Advocate Specialist [place] Patient Advocate Team Direct Number: [phone]  Fax: [phone]

## 2021-08-30 NOTE — Progress Notes (Signed)
Subjective:  Feels tired  Received 250 cc saline bolus yesterday due to presyncopal symptoms, now resolved  Objective:  Vital Signs in the last 24 hours: Temp:  [97.5 F (36.4 C)-98.7 F (37.1 C)] 98.4 F (36.9 C) (02/22 1611) Pulse Rate:  [91-100] 94 (02/22 1611) Resp:  [11-20] 15 (02/22 1611) BP: (103-130)/(76-87) 103/78 (02/22 1611) SpO2:  [90 %-96 %] 96 % (02/22 1611)  Intake/Output from previous day: 02/21 0701 - 02/22 0700 In: 623.2 [P.O.:477; I.V.:146.2] Out: 2000 [Urine:2000]  Physical Exam Vitals and nursing note reviewed.  Constitutional:      General: He is not in acute distress.    Appearance: He is well-developed.  HENT:     Head: Normocephalic and atraumatic.  Eyes:     Conjunctiva/sclera: Conjunctivae normal.     Pupils: Pupils are equal, round, and reactive to light.  Neck:     Vascular: No JVD.  Cardiovascular:     Rate and Rhythm: Normal rate and regular rhythm.     Pulses: Normal pulses and intact distal pulses.     Heart sounds: No murmur heard. Pulmonary:     Effort: Pulmonary effort is normal.     Breath sounds: Normal breath sounds. No wheezing or rales.  Chest:     Chest wall: Mass (Let breast mass, hard to palpation, retracted nipple) present.  Abdominal:     General: Bowel sounds are normal.     Palpations: Abdomen is soft.     Tenderness: There is no rebound.  Musculoskeletal:        General: No tenderness. Normal range of motion.     Right lower leg: No edema.     Left lower leg: No edema.  Lymphadenopathy:     Cervical: No cervical adenopathy.  Skin:    General: Skin is warm and dry.  Neurological:     Mental Status: He is alert and oriented to person, place, and time.     Cranial Nerves: No cranial nerve deficit.   No significant change in exam compared to 08/29/2020   Lab Results: Reviewed and interpreted: BMP  Imaging/tests reviewed and independently interpreted: Telemetry 08/29/2021: Brief episodes of  NSVT  Echocardiogram 08/27/2021:  1. Left ventricular ejection fraction, by estimation, is 20 to 25%. The  left ventricle has severely decreased function. The left ventricle  demonstrates global hypokinesis. The left ventricular internal cavity size  was dilated. Left ventricular diastolic   parameters are consistent with Grade III diastolic dysfunction  (restrictive). Elevated left ventricular end-diastolic pressure.   2. Right ventricular systolic function is hyperdynamic. The right  ventricular size is mildly enlarged. dilated.   3. Left atrial size was mild to moderately dilated.   4. Right atrial size was dilated.   5. A small pericardial effusion is present. The pericardial effusion is  posterior to the left ventricle.   6. The mitral valve is grossly normal. Mild mitral valve regurgitation.  No evidence of mitral stenosis.   7. The aortic valve is tricuspid. Aortic valve regurgitation is not  visualized. No aortic stenosis is present.   8. There is mild dilatation of the ascending aorta, measuring 40 mm.   9. The inferior vena cava is dilated in size with >50% respiratory  variability, suggesting right atrial pressure of 8 mmHg.   Cardiac MRI 08/29/2021: 1.  Left breast mass as seen on prior CT.  Ongoing workup. 2. Severely dilated LV with mild concentric LV hypertrophy. EF 10% with global hypokinesis. 3. There is an  amorphous LV thrombus. The patient is on heparin gtt. 4.  Mildly dilated RV with EF 15%. 5. Delayed enhancement pattern as above. The inferolateral LGE could be a OM territory infarct, but corresponding lesion not seen by cath. Overall, most likely acute myocarditis given subepicardial apical inferior LGE also (the RV insertion site LGE can be nonspecific in the setting of volume/pressure overload). Elevated T2 in the apical inferior wall is more suggestive of acute myocarditis. 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  I discussed the MRI findings at length with heart failure specialist Dr. Aundra Dubin. 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  Other cardiac Studies: LHC/RHC 08/28/2021: Normal coronary arteries without coronary artery disease   RA: 10 mmHg RV: 49/4 mmHg PA: 44/24 mmHg, mPAP 34 mmHg PCW: 22 mmHg   CO: 5.7 L/min CI: 2.3 L/min/m2   Decompensated nonischemic cardiomyopathy Resume IV heparin 2 hours after TR band is off given LV apical thrombus GDMT for heart failure and continued workup for breast mass  EKG 08/27/2021: Sinus tachycardia Possible Left atrial enlargement Right axis deviation Nonspecific T wave abnormality Abnormal ECG No previous ECGs available  Assessment & Recommendations:  41 year old Caucasian male, Chief Financial Officer by profession working with [employer], admitted with several weeks of dyspnea, found to have new diagnosis of systolic heart failure EF 20%, apical LV thrombus, new diagnosis of breast mass with axillary, mediastinal, and hilar lymphadenopathy.  HFrEF: Acute systolic failure Based on MRI, likely etiology is acute myocarditis. H/o viral illness in December 2022 Responding well to diuresis and GDMT. IV lasix stopped.  Continue Entresto 24-26 mg bid, spironolactone 12.5 mg daily, Farxiga 10 mg daily, Corlanor 5 mg bid, digoxin 0.125 mg daily, metoprolol succinate 12.5 mg daily Discussed w/Dr. Aundra Dubin. No indication for steroids. Appreciate his input. Awaiting LifeVest.  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 Goal INR 2.0-2.5  Elevated troponin: Type 2 MI due to HF and myocarditis  Breast mass: With axillary, mediastinal and hilar lymphadenopathy High suspiion for malignancy Outpatient f/u scheduled with Dr. Lindi Adie 09/11/2021  Anticipate hospital stay for another 2-3 days pending goal INR   Nigel Mormon,  MD Pager: [phone] Office: [phone]

## 2021-08-30 NOTE — Progress Notes (Signed)
I called [employer] representative., Paulla Fore.  Provided date of birth and room number .   Tanmay Halteman NP-C  8:59 AM

## 2021-08-30 NOTE — Progress Notes (Signed)
CARDIAC REHAB PHASE I   PRE:  Rate/Rhythm: 90 SR    BP: sitting 133/93 (in retrospect probably erroneous)    SaO2: 95 RA  MODE:  Ambulation: 1400 ft   POST:  Rate/Rhythm: 105 ST    BP: sitting 109/78     SaO2: 97 RA  Tolerated long walk well, HR controlled, no c/o. Discussed with pt HF management including daily wts, signs of fluid, low sodium diet, importance of meds, exercise, smoking cessation, and CRPII. Pt very receptive. Plans to quit smoking (only 3-4 a day). Will refer to [place]. Gave pt videos to view. [place], ACSM 08/30/2021 3:19 PM

## 2021-08-30 NOTE — Progress Notes (Addendum)
Noted Life Vest order as been updated- spoke with [employer] rep- Paulla Fore- have faxed DME order and needed support documents to start insurance authorization.  1400- update- received msg from [place] with [employer]- pt's insurance requires a min. 3 day authorization period- auth in pending at this time- Reference # for [employer] is [id number] (phone [phone]) TOC will f/u on Friday to see if Life Vest has been authorized.

## 2021-08-30 NOTE — Discharge Instructions (Signed)
Information on my medicine - Coumadin   (Warfarin)  This medication education was reviewed with me or my healthcare representative as part of my discharge preparation.  The pharmacist that spoke with me during my hospital stay was:  Einar Grad, [facility]  Why was Coumadin prescribed for you? Coumadin was prescribed for you because you have a blood clot or a medical condition that can cause an increased risk of forming blood clots. Blood clots can cause serious health problems by blocking the flow of blood to the heart, lung, or brain. Coumadin can prevent harmful blood clots from forming. As a reminder your indication for Coumadin is:  Blood Clotting Disorder  What test will check on my response to Coumadin? While on Coumadin (warfarin) you will need to have an INR test regularly to ensure that your dose is keeping you in the desired range. The INR (international normalized ratio) number is calculated from the result of the laboratory test called prothrombin time (PT).  If an INR APPOINTMENT HAS NOT ALREADY BEEN MADE FOR YOU please schedule an appointment to have this lab work done by your health care provider within 7 days. Your INR goal is usually a number between:  2 to 3 or your provider may give you a more narrow range like 2-2.5.  Ask your health care provider during an office visit what your goal INR is.  What  do you need to  know  About  COUMADIN? Take Coumadin (warfarin) exactly as prescribed by your healthcare provider about the same time each day.  DO NOT stop taking without talking to the doctor who prescribed the medication.  Stopping without other blood clot prevention medication to take the place of Coumadin may increase your risk of developing a new clot or stroke.  Get refills before you run out.  What do you do if you miss a dose? If you miss a dose, take it as soon as you remember on the same day then continue your regularly scheduled regimen the next day.  Do not take two  doses of Coumadin at the same time.  Important Safety Information A possible side effect of Coumadin (Warfarin) is an increased risk of bleeding. You should call your healthcare provider right away if you experience any of the following: Bleeding from an injury or your nose that does not stop. Unusual colored urine (red or dark brown) or unusual colored stools (red or black). Unusual bruising for unknown reasons. A serious fall or if you hit your head (even if there is no bleeding).  Some foods or medicines interact with Coumadin (warfarin) and might alter your response to warfarin. To help avoid this: Eat a balanced diet, maintaining a consistent amount of Vitamin K. Notify your provider about major diet changes you plan to make. Avoid alcohol or limit your intake to 1 drink for women and 2 drinks for men per day. (1 drink is 5 oz. wine, 12 oz. beer, or 1.5 oz. liquor.)  Make sure that ANY health care provider who prescribes medication for you knows that you are taking Coumadin (warfarin).  Also make sure the healthcare provider who is monitoring your Coumadin knows when you have started a new medication including herbals and non-prescription products.  Coumadin (Warfarin)  Major Drug Interactions  Increased Warfarin Effect Decreased Warfarin Effect  Alcohol (large quantities) Antibiotics (esp. Septra/Bactrim, Flagyl, Cipro) Amiodarone (Cordarone) Aspirin (ASA) Cimetidine (Tagamet) Megestrol (Megace) NSAIDs (ibuprofen, naproxen, etc.) Piroxicam (Feldene) Propafenone (Rythmol SR) Propranolol (Inderal) Isoniazid (INH)  Posaconazole (Noxafil) Barbiturates (Phenobarbital) Carbamazepine (Tegretol) Chlordiazepoxide (Librium) Cholestyramine (Questran) Griseofulvin Oral Contraceptives Rifampin Sucralfate (Carafate) Vitamin K   Coumadin (Warfarin) Major Herbal Interactions  Increased Warfarin Effect Decreased Warfarin Effect  Garlic Ginseng Ginkgo biloba Coenzyme Q10 Green  tea St. Johns wort    Coumadin (Warfarin) FOOD Interactions  Eat a consistent number of servings per week of foods HIGH in Vitamin K (1 serving =  cup)  Collards (cooked, or boiled & drained) Kale (cooked, or boiled & drained) Mustard greens (cooked, or boiled & drained) Parsley *serving size only =  cup Spinach (cooked, or boiled & drained) Swiss chard (cooked, or boiled & drained) Turnip greens (cooked, or boiled & drained)  Eat a consistent number of servings per week of foods MEDIUM-HIGH in Vitamin K (1 serving = 1 cup)  Asparagus (cooked, or boiled & drained) Broccoli (cooked, boiled & drained, or raw & chopped) Brussel sprouts (cooked, or boiled & drained) *serving size only =  cup Lettuce, raw (green leaf, endive, romaine) Spinach, raw Turnip greens, raw & chopped   These websites have more information on Coumadin (warfarin):  FailFactory.se; VeganReport.com.au;   Information on my medicine - LOVENOX (enoxaparin)  This medication education was reviewed with me or my healthcare representative as part of my discharge preparation.  The pharmacist that spoke with me during my hospital stay was: Einar Grad, [facility]   Why was lovenox prescribed for you? Lovenox was prescribed to treat blood clots that may have been found in the veins of your legs (deep vein thrombosis) or in your lungs (pulmonary embolism) and to reduce the risk of them occurring again.  What do You need to know about Lovenox? Lovenox  is given by a shot under the skin by you or a care provider. Lovenox is injected into the right or left side of your abdomen at least 2 inches away from your belly button. Clean the injection site with an alcohol swab and let dry. Give only the amount prescribed to you by your healthcare provider and do not change the dose unless instructed by your health care provider.  Try to administer the dose(s) about the same time every day. To minimize  bruising and irritation, rotate where the shots are given.  Store Lovenox at room temperature, away from heat and direct light. Do not refrigerate or freeze the syringes. Keep away from children or pets.  Take Lovenox exactly as prescribed and DO NOT stop taking Lovenox without talking to the doctor who prescribed the medication.  Stopping may increase your risk of developing a new blood clot.    After discharge, you should have regular appointments with your healthcare provider.  Dispose of used syringe and cap in a sharps disposal container once used (if sharps disposal container unavailable, use any hard plastic jug that can be securely closed).     What do you do if you miss a dose? If a dose of Lovenox is not administered at the scheduled time, administer it as soon as possible on the same day and then resume regular administration schedule. The dose should not be doubled to make up for a missed dose.  Important Safety Information A possible side effect of Lovenox is bleeding. You should call your healthcare provider right away if you experience any of the following: Bleeding from an injury or your nose that does not stop. Unusual colored urine (red or dark brown) or unusual colored stools (red or black). Unusual bruising for unknown reasons. A serious  fall or if you hit your head (even if there is no bleeding).  Some medicines may interact with Lovenox and might increase your risk of bleeding or clotting while on Lovenox. To help avoid this, consult your healthcare provider or pharmacist prior to using any new prescription or non-prescription medications, including herbals, vitamins, non-steroidal anti-inflammatory drugs (NSAIDs) and supplements.  This website has more information on Lovenox (enoxaparin): www.[email or web address]://www.rowland.com/.

## 2021-08-31 ENCOUNTER — Other Ambulatory Visit (HOSPITAL_COMMUNITY): Payer: Self-pay

## 2021-08-31 ENCOUNTER — Telehealth: Payer: Self-pay | Admitting: Cardiology

## 2021-08-31 ENCOUNTER — Other Ambulatory Visit: Payer: Self-pay | Admitting: *Deleted

## 2021-08-31 DIAGNOSIS — N63 Unspecified lump in unspecified breast: Secondary | ICD-10-CM

## 2021-08-31 DIAGNOSIS — N6320 Unspecified lump in the left breast, unspecified quadrant: Secondary | ICD-10-CM

## 2021-08-31 LAB — CBC
HCT: 53.3 % — ABNORMAL HIGH (ref 39.0–52.0)
Hemoglobin: 17.8 g/dL — ABNORMAL HIGH (ref 13.0–17.0)
MCH: 30.5 pg (ref 26.0–34.0)
MCHC: 33.4 g/dL (ref 30.0–36.0)
MCV: 91.4 fL (ref 80.0–100.0)
Platelets: 251 10*3/uL (ref 150–400)
RBC: 5.83 MIL/uL — ABNORMAL HIGH (ref 4.22–5.81)
RDW: 13.9 % (ref 11.5–15.5)
WBC: 12.6 10*3/uL — ABNORMAL HIGH (ref 4.0–10.5)
nRBC: 0 % (ref 0.0–0.2)

## 2021-08-31 LAB — BASIC METABOLIC PANEL
Anion gap: 10 (ref 5–15)
BUN: 12 mg/dL (ref 6–20)
CO2: 21 mmol/L — ABNORMAL LOW (ref 22–32)
Calcium: 8.7 mg/dL — ABNORMAL LOW (ref 8.9–10.3)
Chloride: 105 mmol/L (ref 98–111)
Creatinine, Ser: 0.98 mg/dL (ref 0.61–1.24)
GFR, Estimated: >60 mL/min (ref >60)
Glucose, Bld: 109 mg/dL — ABNORMAL HIGH (ref 70–99)
Potassium: 4.1 mmol/L (ref 3.5–5.1)
Sodium: 136 mmol/L (ref 135–145)

## 2021-08-31 LAB — PROTIME-INR
INR: 1.2 (ref 0.8–1.2)
Prothrombin Time: 15.3 seconds — ABNORMAL HIGH (ref 11.4–15.2)

## 2021-08-31 LAB — HEPARIN LEVEL (UNFRACTIONATED): Heparin Unfractionated: <0.1 IU/mL — ABNORMAL LOW (ref 0.30–0.70)

## 2021-08-31 MED ORDER — SACUBITRIL-VALSARTAN 24-26 MG PO TABS
1.0000 | ORAL_TABLET | Freq: Two times a day (BID) | ORAL | 2 refills | Status: DC
  Filled 2021-08-31 – 2021-09-26 (×3): qty 60, 30d supply, fill #0

## 2021-08-31 MED ORDER — DIGOXIN 125 MCG PO TABS
0.1250 mg | ORAL_TABLET | Freq: Every day | ORAL | 2 refills | Status: DC
  Filled 2021-08-31 – 2021-09-26 (×3): qty 30, 30d supply, fill #0
  Filled 2021-10-31: qty 30, 30d supply, fill #1

## 2021-08-31 MED ORDER — METOPROLOL SUCCINATE ER 25 MG PO TB24: 25.0000 mg | ORAL_TABLET | Freq: Every day | ORAL | Status: DC

## 2021-08-31 MED ORDER — WARFARIN SODIUM 2.5 MG PO TABS: 12.5000 mg | ORAL_TABLET | Freq: Once | ORAL | Status: DC

## 2021-08-31 MED ORDER — METOPROLOL SUCCINATE ER 25 MG PO TB24
25.0000 mg | ORAL_TABLET | Freq: Every day | ORAL | 2 refills | Status: DC
  Filled 2021-08-31 – 2021-09-26 (×3): qty 30, 30d supply, fill #0
  Filled 2021-10-31: qty 30, 30d supply, fill #1

## 2021-08-31 MED ORDER — EPLERENONE 25 MG PO TABS
50.0000 mg | ORAL_TABLET | Freq: Every day | ORAL | 2 refills | Status: DC
Start: 2021-09-01 — End: 2021-11-21
  Filled 2021-08-31 – 2021-09-26 (×3): qty 60, 30d supply, fill #0
  Filled 2021-10-31: qty 60, 30d supply, fill #1

## 2021-08-31 MED ORDER — WARFARIN SODIUM 2.5 MG PO TABS
12.5000 mg | ORAL_TABLET | Freq: Once | ORAL | 2 refills | Status: DC
  Filled 2021-08-31 (×2): qty 120, 29d supply, fill #0

## 2021-08-31 MED ORDER — IVABRADINE HCL 5 MG PO TABS
5.0000 mg | ORAL_TABLET | Freq: Two times a day (BID) | ORAL | 2 refills | Status: DC
  Filled 2021-08-31 – 2021-09-26 (×3): qty 60, 30d supply, fill #0
  Filled 2021-10-31 – 2021-11-02 (×2): qty 60, 30d supply, fill #1

## 2021-08-31 MED ORDER — METOPROLOL SUCCINATE ER 25 MG PO TB24: 12.5000 mg | ORAL_TABLET | Freq: Once | ORAL | Status: DC

## 2021-08-31 MED ORDER — ENOXAPARIN SODIUM 120 MG/0.8ML IJ SOSY
120.0000 mg | PREFILLED_SYRINGE | Freq: Two times a day (BID) | INTRAMUSCULAR | 1 refills | Status: DC
  Filled 2021-08-31 (×2): qty 22.4, 14d supply, fill #0

## 2021-08-31 MED ORDER — DAPAGLIFLOZIN PROPANEDIOL 10 MG PO TABS
10.0000 mg | ORAL_TABLET | Freq: Every day | ORAL | 2 refills | Status: DC
  Filled 2021-08-31 – 2021-09-26 (×3): qty 30, 30d supply, fill #0
  Filled 2021-10-31: qty 30, 30d supply, fill #1

## 2021-08-31 MED ORDER — ENOXAPARIN SODIUM 120 MG/0.8ML IJ SOSY
120.0000 mg | PREFILLED_SYRINGE | Freq: Two times a day (BID) | INTRAMUSCULAR | Status: DC
  Administered 2021-08-31: 120 mg via SUBCUTANEOUS
  Filled 2021-08-31 (×2): qty 0.8

## 2021-08-31 NOTE — Progress Notes (Signed)
Received call from [facility]- pt has been approved for home Life Vest- pt to be fitted today at bedside prior to discharge with home [place] to alert CM of timeframe for fitting when known.

## 2021-08-31 NOTE — Discharge Summary (Addendum)
Physician Discharge Summary  Patient ID: Michael Stuart MRN: [id number] DOB/AGE: 04/07/1981 41 y.o.  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  Patient has had retracted nipple on left side for almost a year, but did not seek treatment.  He had respiratory viral illness around December 2022, and has had progressive dyspnea since then.  He underwent coronary angiography showed no significant coronary artery disease.  Cardiac MRI showed possible myocarditis.  Patient responded to IV diuresis and initiation of heart failure management with significant improvement in his dyspnea symptoms.  He was also found to have apical thrombus secondary to systolic heart failure.  He was started on heparin/warfarin bridge.  Given slow increase in his INR levels, he was discharged on Lovenox/warfarin bridge.  He was able to ambulate on the day of discharge without any significant symptoms. He was also seen by heart failure specialist Dr. Aundra Dubin.  LifeVest was arranged given his risk of ventricular arrhythmia.  Oncology was contacted, and is scheduled to see Dr. Lindi Adie on 09/11/2021.    Discharge Exam: Blood pressure 115/80, pulse 94, temperature 98.3 F (36.8 C), temperature source Oral, resp. rate (!) 27, height 6\' 3"  (1.905 m), weight 113.7 kg, SpO2 94 %.   Physical Exam Vitals and nursing note reviewed.  Constitutional:      General: He is not in acute distress. Neck:     Vascular: No JVD.   Cardiovascular:     Rate and Rhythm: Normal rate and regular rhythm.     Heart sounds: Normal heart sounds. No murmur heard. Pulmonary:     Effort: Pulmonary effort is normal.     Breath sounds: Normal breath sounds. No wheezing or rales.  Musculoskeletal:     Right lower leg: No edema.     Left lower leg: No edema.     Significant Diagnostic Studies:  Imaging/tests reviewed and independently interpreted: Telemetry 08/29/2021: Brief episodes of NSVT   Echocardiogram 08/27/2021:  1. Left ventricular ejection fraction, by estimation, is 20 to 25%. The  left ventricle has severely decreased function. The left ventricle  demonstrates global hypokinesis. The left ventricular internal cavity size  was dilated. Left ventricular diastolic   parameters are consistent with Grade III diastolic dysfunction  (restrictive). Elevated left ventricular end-diastolic pressure.   2. Right ventricular systolic function is hyperdynamic. The right  ventricular size is mildly enlarged. dilated.   3. Left atrial size was mild to moderately dilated.   4. Right atrial size was dilated.   5. A small pericardial effusion is present. The pericardial effusion is  posterior to the left ventricle.   6. The mitral valve is grossly normal. Mild mitral valve regurgitation.  No evidence of mitral stenosis.   7. The aortic valve is tricuspid. Aortic valve regurgitation is not  visualized. No aortic stenosis is present.   8. There is mild dilatation of the ascending aorta, measuring 40 mm.   9. The inferior vena cava is dilated in size with >50% respiratory  variability, suggesting right atrial pressure of 8 mmHg.    Cardiac MRI 08/29/2021: 1.  Left breast mass as seen on prior CT.  Ongoing workup. 2. Severely dilated LV with mild concentric LV hypertrophy. EF 10% with global hypokinesis. 3. 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 The patient is on heparin gtt. 4.  Mildly dilated RV with EF 15%. 5. Delayed  enhancement pattern as above. The inferolateral LGE could be a OM territory infarct, but corresponding lesion not seen by cath. Overall, most likely acute myocarditis given subepicardial apical inferior LGE also (the RV insertion site LGE can be nonspecific in the setting of volume/pressure overload). Elevated T2 in the apical inferior wall is more suggestive of acute myocarditis. 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   I discussed the MRI findings at length with heart failure specialist Dr. Aundra Dubin. 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   Other cardiac Studies: LHC/RHC 08/28/2021: Normal coronary arteries without coronary artery disease   RA: 10 mmHg RV: 49/4 mmHg PA: 44/24 mmHg, mPAP 34 mmHg PCW: 22 mmHg   CO: 5.7 L/min CI: 2.3 L/min/m2   Decompensated nonischemic cardiomyopathy Resume IV heparin 2 hours after TR band is off given LV apical thrombus GDMT for heart failure and continued workup for breast mass   EKG 08/27/2021: Sinus tachycardia Possible Left atrial enlargement Right axis deviation Nonspecific T wave abnormality Abnormal ECG No previous ECGs available   FOLLOW UP PLANS AND APPOINTMENTS Discharge Instructions     Amb Referral to Cardiac Rehabilitation   Complete by: As directed    Diagnosis: Heart Failure (see criteria below if ordering Phase II)   Heart Failure Type: Chronic Systolic & Diastolic   After initial evaluation and assessments completed: Virtual Based Care may be provided alone or in conjunction with Phase 2 Cardiac Rehab based on patient barriers.: Yes   Diet - low sodium heart healthy   Complete by: As directed    Heart Failure patients record your daily weight using the same scale at the same time of day   Complete by: As directed    Increase activity slowly   Complete by: As directed        Allergies as of 08/31/2021       Reactions   Bactrim [sulfamethoxazole-trimethoprim] Other (See Comments)   Blood Capillaries Rupture         Medication List     STOP taking these medications    DOXYCYCLINE PO       TAKE these medications    acetaminophen 500 MG tablet Commonly known as: TYLENOL Take 1,000 mg by mouth every 6 (six) hours as needed for moderate pain.   albuterol 108 (90 Base) MCG/ACT inhaler Commonly known as: VENTOLIN HFA Inhale 1-2 puffs into the lungs every 6 (six) hours as needed for wheezing or shortness of breath.   antiseptic oral rinse Liqd 15 mLs by Mouth Rinse route as needed for dry mouth.   benzonatate 100 MG capsule Commonly known as: TESSALON Take 1 capsule (100 mg total) by mouth every 8 (eight) hours as needed for cough.   dapagliflozin propanediol 10 MG Tabs tablet Commonly known as: FARXIGA Take 1 tablet (10 mg total) by mouth daily. Start taking on: September 01, 2021   digoxin 0.125 MG tablet Commonly known as: LANOXIN Take 1 tablet (0.125 mg total) by mouth daily. Start taking on: September 01, 2021   enoxaparin 120 MG/0.8ML injection Commonly known as: LOVENOX Inject 1 syringe (120 mg total) into the skin  every 12 (twelve) hours.   eplerenone 25 MG tablet Commonly known as: INSPRA Take 2 tablets (50 mg total) by mouth daily. Start taking on: September 01, 2021   ivabradine 5 MG Tabs tablet Commonly known as: CORLANOR Take 1 tablet (5 mg total) by mouth 2 (two) times daily with a meal.   metoprolol succinate 25 MG 24 hr tablet Commonly known as: TOPROL-XL Take 1 tablet (25 mg total) by mouth daily. Start taking on: September 01, 2021   PROMETHAZINE HCL PO Take 1 capsule by mouth as needed (sleep).   sacubitril-valsartan 24-26 MG Commonly known as: ENTRESTO Take 1 tablet by mouth 2 (two) times daily.   warfarin 2.5 MG tablet Commonly known as: COUMADIN Take 5 tablets (12.5 mg total) by mouth today, then take 4  tablets (10mg ) daily or as directed following INR lab               Durable Medical Equipment  (From admission, onward)           Start     Ordered   08/30/21 0837  For home use only DME Vest life vest  Once       Comments: 3 months VT 150 bpm VF 200 BPM 150Jx5 Length of need: 3 months  Start Date: 08/30/21   08/30/21 0836            Follow-up Information     [facility] Follow up on 09/13/2021.   Specialty: Cardiology Why: Advanced Heart Failure Clinic at [facility] 9:30 am Entrance C, Garage Code [place] information: [address] [id number] [place]        Alysia Penna, [facility] Follow up on 09/07/2021.   Specialty: Pharmacist Contact information: [address] [place] [address] [phone]         Nigel Mormon, MD Follow up on 09/04/2021.   Specialties: Cardiology, Radiology Why: 3 PM Contact information: [place] [address] [phone]                 Discharge time: 45-minute Time spent in coordinating care, arranging outpatient follow-up, arranging LifeVest.   Nigel Mormon, MD Pager: [phone] Office: [phone]

## 2021-08-31 NOTE — Progress Notes (Signed)
ANTICOAGULATION CONSULT NOTE - Follow Up Consult  Pharmacy Consult for heparin Indication:  LV thrombus  Labs: Recent Labs    08/28/21 1415 08/28/21 2138 08/29/21 0737 08/29/21 1551 08/30/21 0159 08/31/21 0205  HGB  --   --  17.9*  --  17.4* 17.8*  HCT  --   --  52.6*  --  53.2* 53.3*  PLT  --   --  325  --  311 251  LABPROT  --   --   --  14.6 14.1 15.3*  INR  --   --   --  1.1 1.1 1.2  HEPARINUNFRC  --    < > 0.38 0.29* 0.32 <0.10*  CREATININE 1.20  --   --   --  0.96 0.98   < > = values in this interval not displayed.    Assessment: 41yo male subtherapeutic on heparin with lower heparin level despite increased rate; no infusion issues or signs of bleeding per RN.  Goal of Therapy:  Heparin level 0.3-0.7 units/ml   Plan:  Will increase heparin infusion by 3 units/kg/hr to 2700 units/hr and check level in 6 hours.    Wynona Neat, PharmD, BCPS  08/31/2021,3:28 AM

## 2021-08-31 NOTE — Progress Notes (Signed)
ANTICOAGULATION CONSULT NOTE  Pharmacy Consult for heparin Indication: LV thrombus  Allergies  Allergen Reactions   Bactrim [Sulfamethoxazole-Trimethoprim] Other (See Comments)    Blood Capillaries Rupture     Patient Measurements: Height: 6\' 3"  (190.5 cm) Weight: 113.7 kg (250 lb 11.2 oz) IBW/kg (Calculated) : 84.5 Heparin Dosing Weight: 91 kg   Vital Signs: Temp: 98.7 F (37.1 C) (02/23 0404) Temp Source: Oral (02/23 0404) BP: 119/77 (02/23 0404) Pulse Rate: 98 (02/23 0404)  Labs: Recent Labs    08/28/21 1415 08/28/21 2138 08/29/21 0737 08/29/21 1551 08/30/21 0159 08/31/21 0205  HGB  --   --  17.9*  --  17.4* 17.8*  HCT  --   --  52.6*  --  53.2* 53.3*  PLT  --   --  325  --  311 251  LABPROT  --   --   --  14.6 14.1 15.3*  INR  --   --   --  1.1 1.1 1.2  HEPARINUNFRC  --    < > 0.38 0.29* 0.32 <0.10*  CREATININE 1.20  --   --   --  0.96 0.98   < > = values in this interval not displayed.     Estimated Creatinine Clearance: 133.6 mL/min (by C-G formula based on SCr of 0.98 mg/dL).   Assessment: 1 YOM who presents with SOB and elevated troponins on IV heparin for ACS.  Also found to have a LV thrombus.  Heparin subtherapeutic, will switch to enoxaparin in anticipation of discharge. INR 1.2 up slightly, CBc stable.  Goal of Therapy:  Heparin level 0.3-0.7 units/ml Monitor platelets by anticoagulation protocol: Yes   Plan:  -Enoxaparin 1mg /kg (120mg ) SQ BID -Warfarin 12.5mg  x1 -If discharge would recommend 10mg  daily -INR daily - if discharged, needs INR next next week   Arrie Senate, PharmD, BCPS, [facility] Clinical Pharmacist [phone] Please check AMION for all [place] numbers 08/31/2021

## 2021-08-31 NOTE — Telephone Encounter (Signed)
Patient needs TOC, appt on 09/04/21 per MP.

## 2021-08-31 NOTE — Progress Notes (Signed)
[facility] nurse in to fit pt with life vest.  [place]

## 2021-08-31 NOTE — Progress Notes (Signed)
Patient ID: Michael Stuart, male   DOB: 1981-03-20, 40 y.o.   MRN: [id number]     Advanced Heart Failure Rounding Note  PCP-Cardiologist: None   Subjective:    No complaints this morning, wants to go home.   INR remains 1.2 on heparin gtt + warfarin.    Objective:   Weight Range: 113.7 kg Body mass index is 31.34 kg/m.   Vital Signs:   Temp:  [98 F (36.7 C)-99.2 F (37.3 C)] 98.3 F (36.8 C) (02/23 0822) Pulse Rate:  [90-100] 94 (02/23 0822) Resp:  [11-27] 27 (02/23 0822) BP: (103-122)/(77-80) 115/80 (02/23 0822) SpO2:  [94 %-98 %] 94 % (02/23 0822) Weight:  [113.7 kg] 113.7 kg (02/23 0404) Last BM Date : 08/29/21  Weight change: Filed Weights   08/28/21 0524 08/29/21 0600 08/31/21 0404  Weight: 117.2 kg 115.2 kg 113.7 kg    Intake/Output:   Intake/Output Summary (Last 24 hours) at 08/31/2021 0910 Last data filed at 08/31/2021 0827 Gross per 24 hour  Intake 240 ml  Output 1575 ml  Net -1335 ml      Physical Exam    General: NAD Neck: No JVD, no thyromegaly or thyroid nodule.  Lungs: Clear to auscultation bilaterally with normal respiratory effort. CV: Nondisplaced PMI.  Heart regular S1/S2, no S3/S4, no murmur.  No peripheral edema.   Abdomen: Soft, nontender, no hepatosplenomegaly, no distention.  Skin: Intact without lesions or rashes.  Neurologic: Alert and oriented x 3.  Psych: Normal affect. Extremities: No clubbing or cyanosis.  HEENT: Normal.   Telemetry   NSR 90s-100s, personally reviewed  Labs    CBC Recent Labs    08/30/21 0159 08/31/21 0205  WBC 8.8 12.6*  HGB 17.4* 17.8*  HCT 53.2* 53.3*  MCV 91.7 91.4  PLT 311 947   Basic Metabolic Panel Recent Labs    08/30/21 0159 08/31/21 0205  NA 134* 136  K 3.9 4.1  CL 101 105  CO2 22 21*  GLUCOSE 108* 109*  BUN 10 12  CREATININE 0.96 0.98  CALCIUM 8.7* 8.7*   Liver Function Tests Recent Labs    08/28/21 1415  AST 35  ALT 50*  ALKPHOS 46  BILITOT 0.8  PROT 6.1*  ALBUMIN  3.2*   No results for input(s): LIPASE, AMYLASE in the last 72 hours. Cardiac Enzymes No results for input(s): CKTOTAL, CKMB, CKMBINDEX, TROPONINI in the last 72 hours.  BNP: BNP (last 3 results) Recent Labs    08/27/21 1125  BNP 642.0*    ProBNP (last 3 results) No results for input(s): PROBNP in the last 8760 hours.   D-Dimer No results for input(s): DDIMER in the last 72 hours.  Hemoglobin A1C No results for input(s): HGBA1C in the last 72 hours.  Fasting Lipid Panel No results for input(s): CHOL, HDL, LDLCALC, TRIG, CHOLHDL, LDLDIRECT in the last 72 hours.  Thyroid Function Tests No results for input(s): TSH, T4TOTAL, T3FREE, THYROIDAB in the last 72 hours.  Invalid input(s): FREET3   Other results:   Imaging    No results found.   Medications:     Scheduled Medications:  dapagliflozin propanediol  10 mg Oral Daily   digoxin  0.125 mg Oral Daily   enoxaparin (LOVENOX) injection  120 mg Subcutaneous Q12H   eplerenone  50 mg Oral Daily   ivabradine  5 mg Oral BID WC   metoprolol succinate  12.5 mg Oral Once   [START ON 09/01/2021] metoprolol succinate  25 mg Oral Daily  sacubitril-valsartan  1 tablet Oral BID   sodium chloride flush  3 mL Intravenous Q12H   sodium chloride flush  3 mL Intravenous Q12H   sodium chloride flush  3 mL Intravenous Q12H   warfarin  12.5 mg Oral ONCE-1600   Warfarin - Pharmacist Dosing Inpatient   Does not apply q1600    Infusions:  sodium chloride     sodium chloride     sodium chloride      PRN Medications: sodium chloride, sodium chloride, acetaminophen, antiseptic oral rinse, ondansetron (ZOFRAN) IV, sodium chloride flush, sodium chloride flush, zolpidem   Assessment/Plan   1. Acute systolic CHF: Patient presents with CHF after a viral-type illness. HIV, TSH, COVID-19 test, and UDS were all negative. I do not think he drinks enough ETOH to cause CMP. Patient has no family history of cardiomyopathy.   LHC/RHC  showed no significant CAD, elevated filling pressures and relatively preserved cardiac output. Echo showed EF 20-25%.   Cardiac MRI was done today, showing severe LV dilation with EF 10%, LV thrombus, mildly dilated RV with EF 15%.  Delayed enhancement imaging showed inferolateral LGE that could be a OM territory infarct, but corresponding lesion not seen by cath. Overall, most likely acute myocarditis given subepicardial apical inferior LGE also (the RV insertion site LGE can be nonspecific in the setting of volume/pressure overload). Elevated T2 in the apical inferior wall is more suggestive of acute myocarditis. 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  Patient looks near euvolemic currently, orthopnea has resolved.  - Can use Lasix prn at discharge.  - Increase Toprol XL to 25 mg daily.   - Continue ivabradine.  - Continue digoxin 0.125 daily.  - Continue Entresto 24/26 bid.  - With likely breast cancer, switched spironolactone over to eplerenone.  - With profound LV dysfunction and areas of LGE, I think it would be safest for him to wear a Lifevest at discharge.  Will repeat echo at 3 months to see if he needs ICD.   2. Syncope: Patient had an episode of non-arrhythmic syncope in the MRI, I suspect this was vagally-mediated based on history.  BP currently stable. 3. LV thrombus: He is on heparin gtt bridging to therapeutic warfarin.  Can transition to Lovenox bridge to go home. Will need close coumadin clinic followup.   4. Breast mass:  Noted on left by imaging with axillary lymphadenopathy, suspect breast cancer.  This seems unrelated to myocarditis/CHF. He will need close oncology followup as outpatient.   I think he can go home today. Will need coumadin clinic followup for Lovenox bridge to warfarin.  Will need Lifevest.  Will need close CHF clinic followup (will arrange).  Meds for home: Warfarin with Lovenox bridge, dapagliflozin 10  daily, digoxin 0.125 daily, eplerenone 50 daily, ivabradine 5 mg bid, Entresto 24/26 bid, Toprol XL 25 daily.   Length of Stay: [place], MD  08/31/2021, 9:10 AM  Advanced Heart Failure Team Pager [phone] (M-F; 7a - 5p)  Please contact [facility] for night-coverage after hours (5p -7a ) and weekends on [email or web address]

## 2021-08-31 NOTE — Progress Notes (Signed)
Discharge AVS meds taken today and those due this evening reviewed.  Follow-up appointments and when to call md reviewed.  D/C IV and TELE.  Questions and concerns addressed.   D/C home per orders.  Carney Corners

## 2021-09-01 ENCOUNTER — Emergency Department (HOSPITAL_COMMUNITY): Payer: BC Managed Care – PPO

## 2021-09-01 ENCOUNTER — Other Ambulatory Visit (HOSPITAL_COMMUNITY): Payer: Self-pay

## 2021-09-01 ENCOUNTER — Encounter: Payer: Self-pay | Admitting: *Deleted

## 2021-09-01 ENCOUNTER — Telehealth: Payer: Self-pay

## 2021-09-01 ENCOUNTER — Other Ambulatory Visit: Payer: Self-pay

## 2021-09-01 ENCOUNTER — Telehealth (HOSPITAL_COMMUNITY): Payer: Self-pay

## 2021-09-01 ENCOUNTER — Inpatient Hospital Stay (HOSPITAL_COMMUNITY)
Admission: EM | Admit: 2021-09-01 | Discharge: 2021-09-03 | Disposition: A | Discharge status: Home / Self Care | Payer: BC Managed Care – PPO | Source: Home / Self Care | Attending: Internal Medicine | Admitting: Internal Medicine

## 2021-09-01 ENCOUNTER — Encounter (HOSPITAL_COMMUNITY): Payer: Self-pay | Admitting: Family Medicine

## 2021-09-01 DIAGNOSIS — Z20822 Contact with and (suspected) exposure to covid-19: Secondary | ICD-10-CM | POA: Diagnosis present

## 2021-09-01 DIAGNOSIS — I5022 Chronic systolic (congestive) heart failure: Secondary | ICD-10-CM | POA: Diagnosis present

## 2021-09-01 DIAGNOSIS — Z6831 Body mass index (BMI) 31.0-31.9, adult: Secondary | ICD-10-CM

## 2021-09-01 DIAGNOSIS — E119 Type 2 diabetes mellitus without complications: Secondary | ICD-10-CM | POA: Diagnosis present

## 2021-09-01 DIAGNOSIS — I639 Cerebral infarction, unspecified: Secondary | ICD-10-CM | POA: Diagnosis present

## 2021-09-01 DIAGNOSIS — Z7901 Long term (current) use of anticoagulants: Secondary | ICD-10-CM

## 2021-09-01 DIAGNOSIS — Z720 Tobacco use: Secondary | ICD-10-CM | POA: Diagnosis present

## 2021-09-01 DIAGNOSIS — R791 Abnormal coagulation profile: Secondary | ICD-10-CM | POA: Diagnosis present

## 2021-09-01 DIAGNOSIS — I5021 Acute systolic (congestive) heart failure: Secondary | ICD-10-CM | POA: Diagnosis present

## 2021-09-01 DIAGNOSIS — H5462 Unqualified visual loss, left eye, normal vision right eye: Secondary | ICD-10-CM | POA: Diagnosis present

## 2021-09-01 DIAGNOSIS — I6340 Cerebral infarction due to embolism of unspecified cerebral artery: Secondary | ICD-10-CM

## 2021-09-01 DIAGNOSIS — I110 Hypertensive heart disease with heart failure: Secondary | ICD-10-CM | POA: Diagnosis present

## 2021-09-01 DIAGNOSIS — Z881 Allergy status to other antibiotic agents status: Secondary | ICD-10-CM

## 2021-09-01 DIAGNOSIS — R471 Dysarthria and anarthria: Secondary | ICD-10-CM | POA: Diagnosis present

## 2021-09-01 DIAGNOSIS — I513 Intracardiac thrombosis, not elsewhere classified: Secondary | ICD-10-CM | POA: Diagnosis present

## 2021-09-01 DIAGNOSIS — N6320 Unspecified lump in the left breast, unspecified quadrant: Secondary | ICD-10-CM | POA: Diagnosis present

## 2021-09-01 DIAGNOSIS — R590 Localized enlarged lymph nodes: Secondary | ICD-10-CM | POA: Diagnosis present

## 2021-09-01 DIAGNOSIS — Z716 Tobacco abuse counseling: Secondary | ICD-10-CM

## 2021-09-01 DIAGNOSIS — R29810 Facial weakness: Secondary | ICD-10-CM | POA: Diagnosis present

## 2021-09-01 DIAGNOSIS — Z79899 Other long term (current) drug therapy: Secondary | ICD-10-CM

## 2021-09-01 DIAGNOSIS — E7800 Pure hypercholesterolemia, unspecified: Secondary | ICD-10-CM | POA: Diagnosis present

## 2021-09-01 DIAGNOSIS — F17210 Nicotine dependence, cigarettes, uncomplicated: Secondary | ICD-10-CM | POA: Diagnosis present

## 2021-09-01 DIAGNOSIS — I429 Cardiomyopathy, unspecified: Secondary | ICD-10-CM | POA: Insufficient documentation

## 2021-09-01 DIAGNOSIS — E669 Obesity, unspecified: Secondary | ICD-10-CM | POA: Diagnosis present

## 2021-09-01 HISTORY — DX: Cerebral infarction, unspecified: I63.9

## 2021-09-01 LAB — COMPREHENSIVE METABOLIC PANEL
ALT: 46 U/L — ABNORMAL HIGH (ref 0–44)
AST: 39 U/L (ref 15–41)
Albumin: 3.4 g/dL — ABNORMAL LOW (ref 3.5–5.0)
Alkaline Phosphatase: 54 U/L (ref 38–126)
Anion gap: 8 (ref 5–15)
BUN: 10 mg/dL (ref 6–20)
CO2: 25 mmol/L (ref 22–32)
Calcium: 9 mg/dL (ref 8.9–10.3)
Chloride: 102 mmol/L (ref 98–111)
Creatinine, Ser: 1.09 mg/dL (ref 0.61–1.24)
GFR, Estimated: >60 mL/min (ref >60)
Glucose, Bld: 91 mg/dL (ref 70–99)
Potassium: 4.8 mmol/L (ref 3.5–5.1)
Sodium: 135 mmol/L (ref 135–145)
Total Bilirubin: 0.7 mg/dL (ref 0.3–1.2)
Total Protein: 6.7 g/dL (ref 6.5–8.1)

## 2021-09-01 LAB — DIFFERENTIAL
Abs Immature Granulocytes: 0.03 10*3/uL (ref 0.00–0.07)
Basophils Absolute: 0.1 10*3/uL (ref 0.0–0.1)
Basophils Relative: 1 %
Eosinophils Absolute: 0.2 10*3/uL (ref 0.0–0.5)
Eosinophils Relative: 2 %
Immature Granulocytes: 0 %
Lymphocytes Relative: 29 %
Lymphs Abs: 3.2 10*3/uL (ref 0.7–4.0)
Monocytes Absolute: 1.5 10*3/uL — ABNORMAL HIGH (ref 0.1–1.0)
Monocytes Relative: 13 %
Neutro Abs: 6.1 10*3/uL (ref 1.7–7.7)
Neutrophils Relative %: 55 %

## 2021-09-01 LAB — RESP PANEL BY RT-PCR (FLU A&B, COVID) ARPGX2
Influenza A by PCR: NEGATIVE
Influenza B by PCR: NEGATIVE
SARS Coronavirus 2 by RT PCR: NEGATIVE

## 2021-09-01 LAB — CBC
HCT: 54.5 % — ABNORMAL HIGH (ref 39.0–52.0)
Hemoglobin: 18.1 g/dL — ABNORMAL HIGH (ref 13.0–17.0)
MCH: 31.2 pg (ref 26.0–34.0)
MCHC: 33.2 g/dL (ref 30.0–36.0)
MCV: 93.8 fL (ref 80.0–100.0)
Platelets: 248 10*3/uL (ref 150–400)
RBC: 5.81 MIL/uL (ref 4.22–5.81)
RDW: 14 % (ref 11.5–15.5)
WBC: 11.2 10*3/uL — ABNORMAL HIGH (ref 4.0–10.5)
nRBC: 0 % (ref 0.0–0.2)

## 2021-09-01 LAB — APTT: aPTT: 36 seconds (ref 24–36)

## 2021-09-01 LAB — PROTIME-INR
INR: 1.5 — ABNORMAL HIGH (ref 0.8–1.2)
Prothrombin Time: 18.4 seconds — ABNORMAL HIGH (ref 11.4–15.2)

## 2021-09-01 IMAGING — MR MR HEAD WO/W CM
14 of 16 series · 40 of 48 positions shown · IV contrast (gadavist)
Comparison: CT head earlier same day

CLINICAL DATA: Abnormal head CT

EXAM:
MRI HEAD WITHOUT AND WITH CONTRAST
TECHNIQUE: Multiplanar, multiecho pulse sequences of the brain and surrounding
structures were obtained without and with intravenous contrast.
CONTRAST:  10mL GADAVIST GADOBUTROL 1 MMOL/ML IV SOLN

[Series 5: DWI · axial · 3.0mm · 0.88mm/px · z∈[-96,+54]mm · 6 of 104 slices shown (1 of 4)]
[im 1/104]
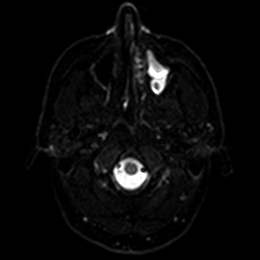
[im 21/104]
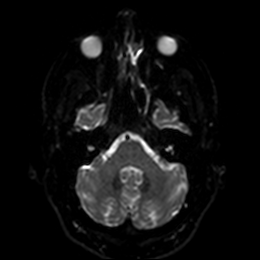
[im 42/104]
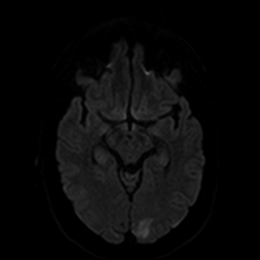
[im 62/104]
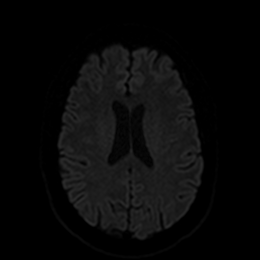
[im 83/104]
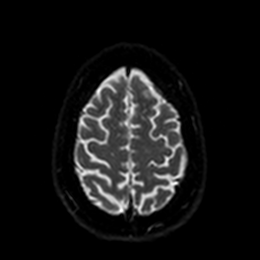
[im 104/104]
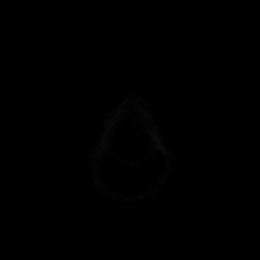

[Series 6: DWI · axial · 3.0mm · 0.88mm/px · z∈[-96,+54]mm · 2 of 52 slices shown (2 of 4)]
[im 1/52]
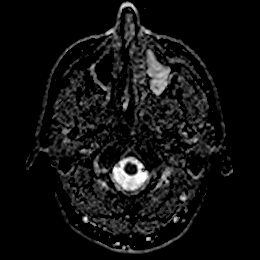
[im 52/52]
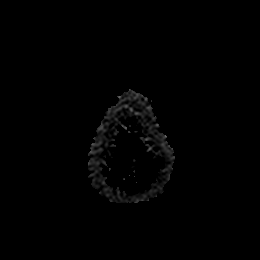

[Series 7: DWI · coronal · 4.0mm · 0.88mm/px · 4 of 76 slices shown (3 of 4)]
[im 1/76]
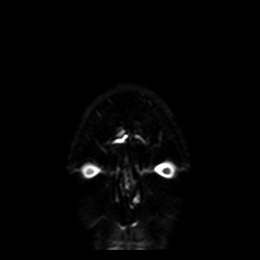
[im 26/76]
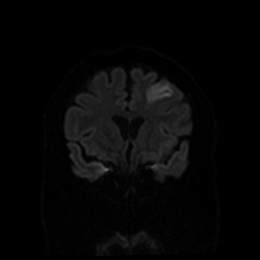
[im 51/76]
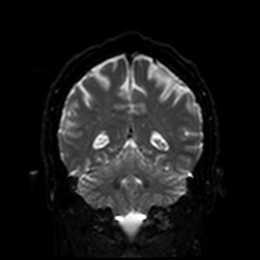
[im 76/76]
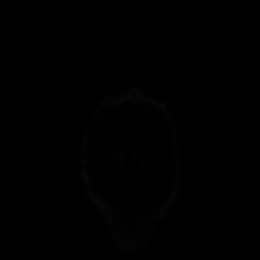

[Series 8: DWI · coronal · 4.0mm · 0.88mm/px · 2 of 38 slices shown (4 of 4)]
[im 1/38]
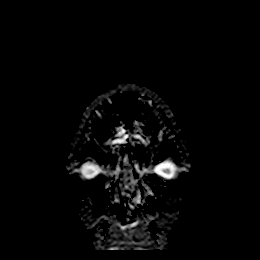
[im 38/38]
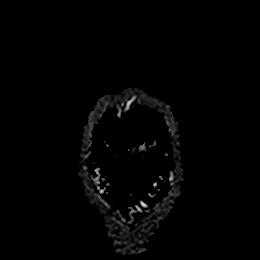

[Series 9: T1 · sagittal · 5.0mm · 0.75mm/px · 2 of 24 slices shown]
[im 1/24]
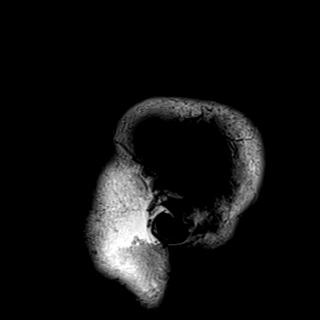
[im 24/24]
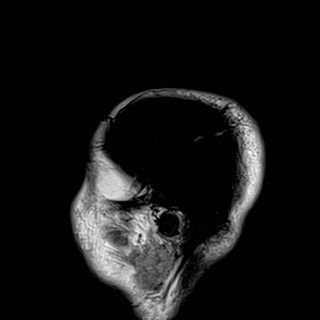

[Series 10: T2 · axial · 5.0mm · 0.72mm/px · z∈[-100,+59]mm · 2 of 28 slices shown]
[im 1/28]
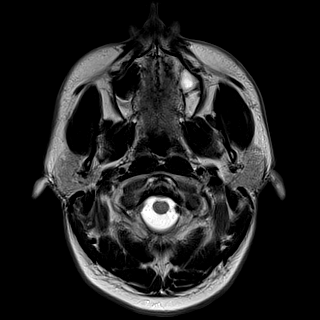
[im 28/28]
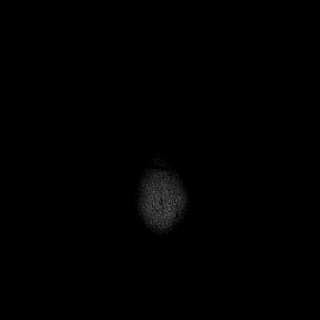

[Series 11: FLAIR · axial · 5.0mm · 0.45mm/px · z∈[-101,+59]mm · 2 of 28 slices shown]
[im 1/28]
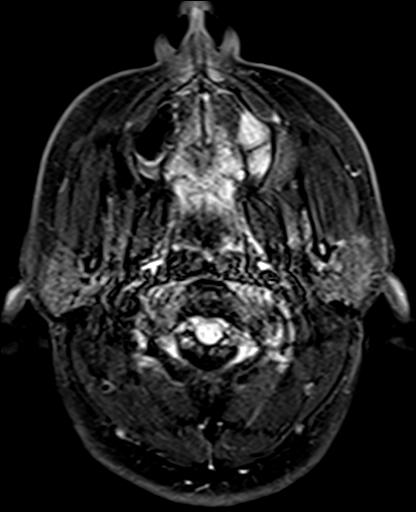
[im 28/28]
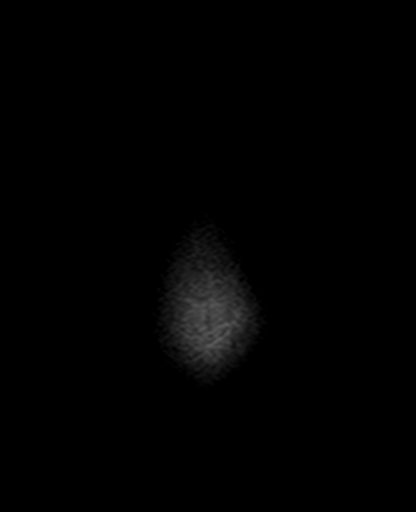

[Series 12: mag_images · axial · 3.0mm · 0.90mm/px · z∈[-102,+60]mm · 4 of 56 slices shown]
[im 1/56]
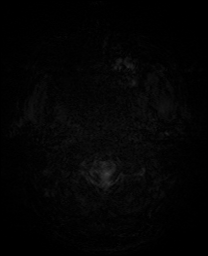
[im 19/56]
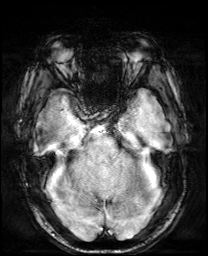
[im 37/56]
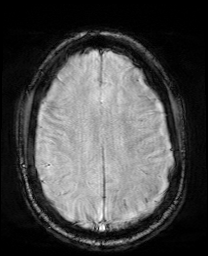
[im 56/56]
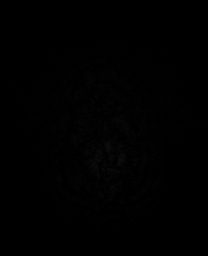

[Series 13: pha_images · axial · 3.0mm · 0.90mm/px · z∈[-102,+54]mm · 3 of 54 slices shown]
[im 1/54]
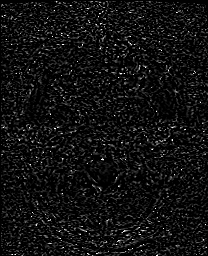
[im 27/54]
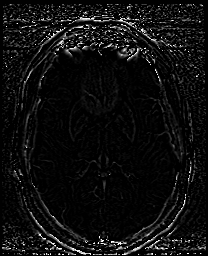
[im 54/54]
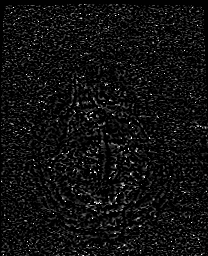

[Series 14: swi_images · axial · 3.0mm · 0.90mm/px · z∈[-102,+60]mm · 4 of 56 slices shown]
[im 1/56]
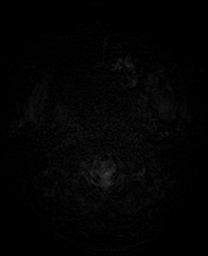
[im 19/56]
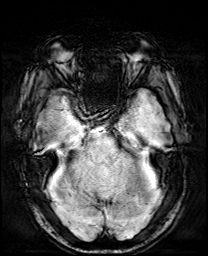
[im 37/56]
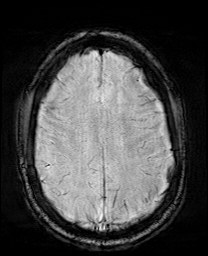
[im 56/56]
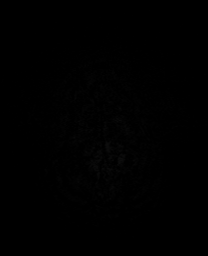

[Series 15: mip_images(sw) · axial · 24.0mm · 0.90mm/px · z∈[-92,+50]mm · 3 of 49 slices shown]
[im 1/49]
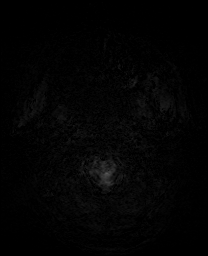
[im 25/49]
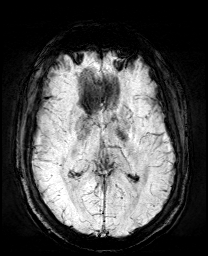
[im 49/49]
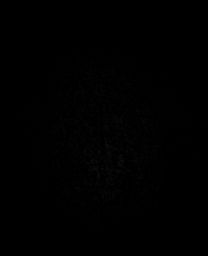

[Series 17: T2 post-contrast · coronal · 5.0mm · 0.72mm/px · 2 of 32 slices shown]
[im 1/32]
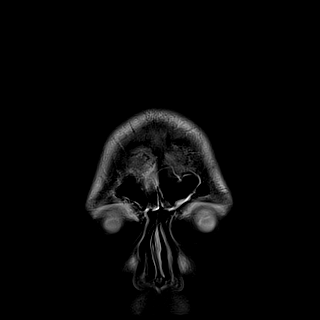
[im 32/32]
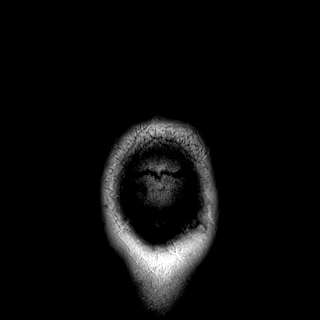

[Series 19: T1 post-contrast · coronal · 5.0mm · 0.34mm/px · 2 of 32 slices shown (1 of 2)]
[im 1/32]
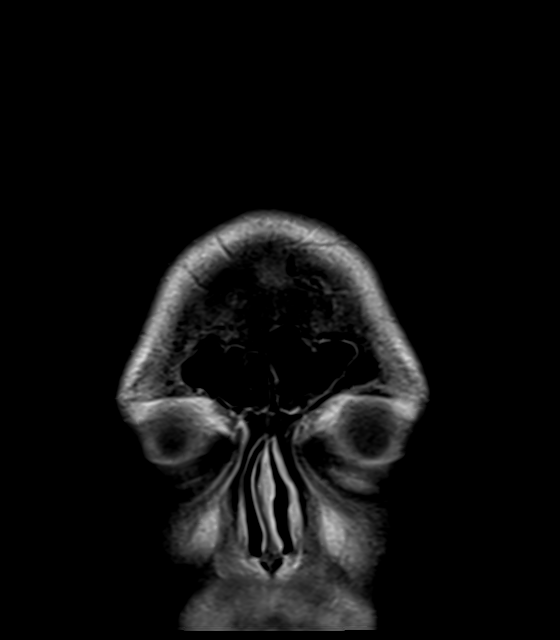
[im 32/32]
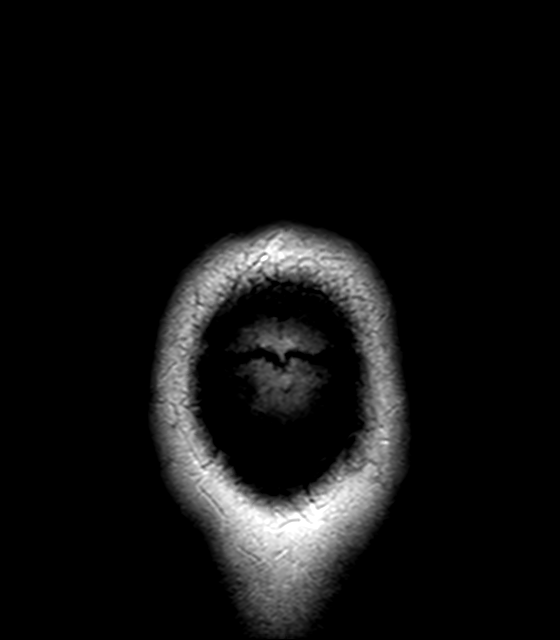

[Series 20: T1 post-contrast · sagittal · 5.0mm · 0.72mm/px · 2 of 24 slices shown (2 of 2)]
[im 1/24]
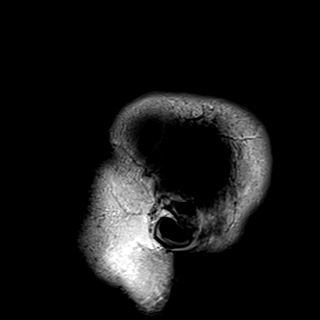
[im 24/24]
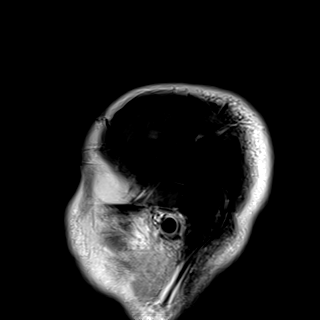

[40 of 48 positions shown; findings below may reference images not displayed]

FINDINGS: Brain: There is cortical/subcortical mildly reduced diffusion in the
left frontal lobe corresponding to abnormality on CT. There is some
associated susceptibility likely reflecting petechial hemorrhage.
Minimal curvilinear enhancement is present. Patchy
cortical/subcortical reduced diffusion is also present in the left
greater than right occipital lobes and left posterior temporal lobe
with additional involvement of the superomedial right thalamus,
posterior left periventricular white matter, and right frontal lobe
along the medial precentral gyrus. There is associated enhancement
in some of these areas. No mass effect.

Ventricles and sulci are normal in size and configuration. A few
additional small foci of T2 hyperintensity in the supratentorial
white matter are nonspecific but may reflect minor chronic
microvascular ischemic changes.

Vascular: Major vessel flow voids at the skull base are preserved.

Skull and upper cervical spine: Normal marrow signal is preserved.

Sinuses/Orbits: Paranasal sinus inflammatory changes as noted on the
prior CT. Orbits are unremarkable.

Other: Sella is unremarkable.  Mastoid air cells are clear.
IMPRESSION: Patchy acute and subacute infarcts as described. Largest area of
involvement is in the left frontal lobe corresponding to abnormality
on CT where there is some petechial hemorrhage. Involvement of
multiple vascular territories suggesting a central source.

No intracranial mass.

## 2021-09-01 IMAGING — CT CT HEAD W/O CM
4 series · 15 of 47 positions shown, 17 images · non-contrast
Comparison: None.

CLINICAL DATA: Neuro deficit, acute, stroke suspected; right facial
numbness and blurry vision



[Series 3: head without · axial · non-contrast · 0.49mm/px · z∈[-85,+35]mm · 7 of 34 slices shown, 9 images]
[im 5/34  brain]
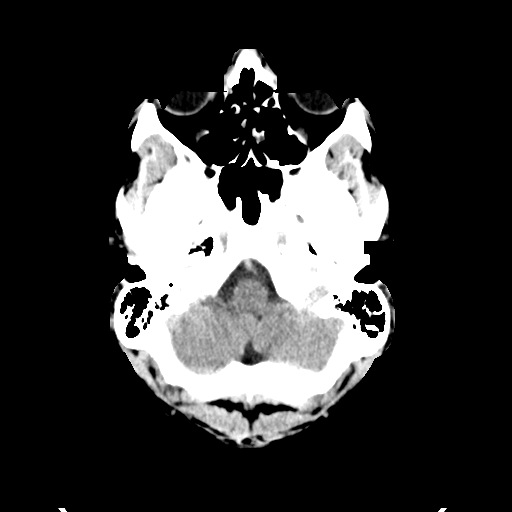
[im 5/34  bone]
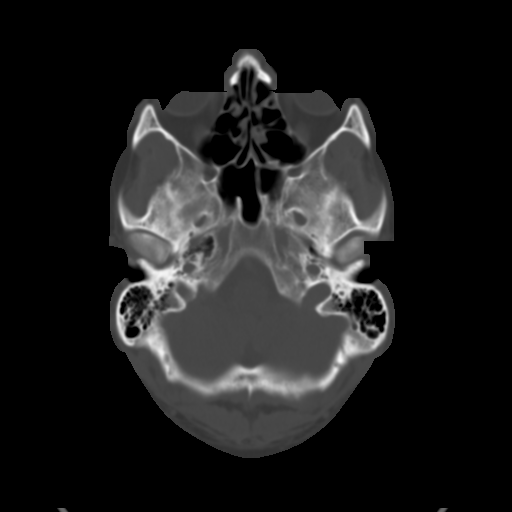
[im 9/34  brain]
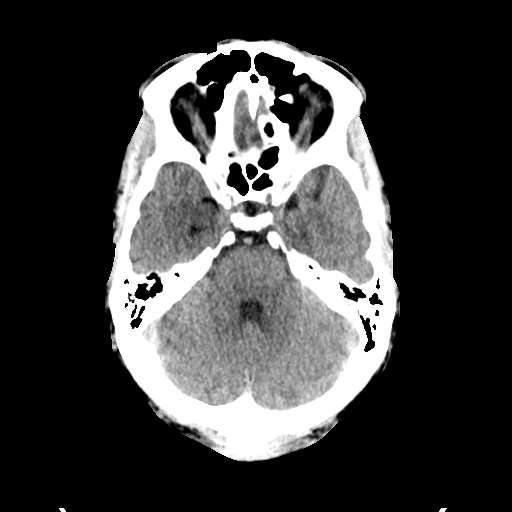
[im 13/34  brain]
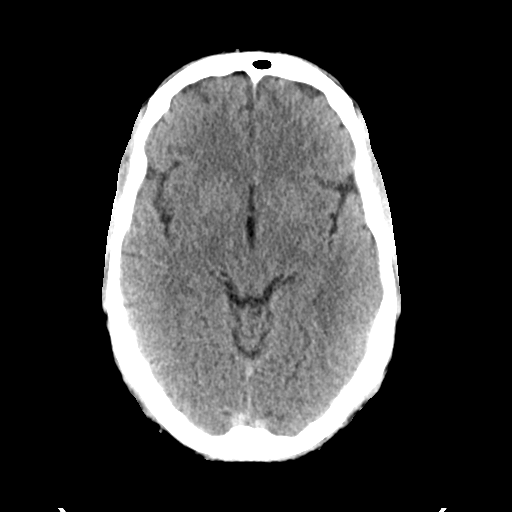
[im 17/34  brain]
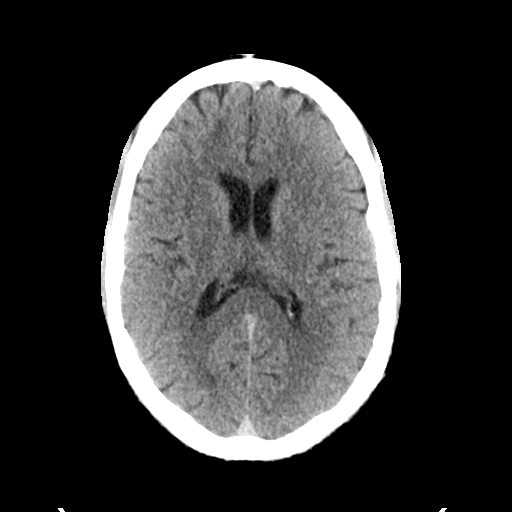
[im 21/34  brain]
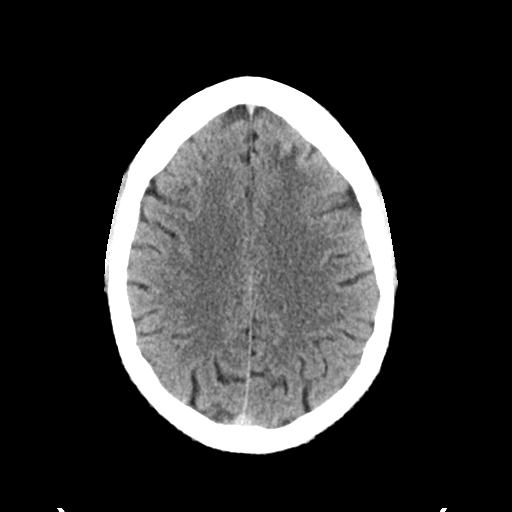
[im 21/34  bone]
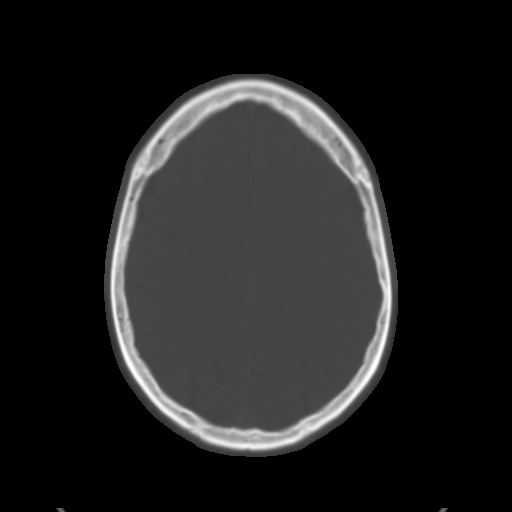
[im 25/34  brain]
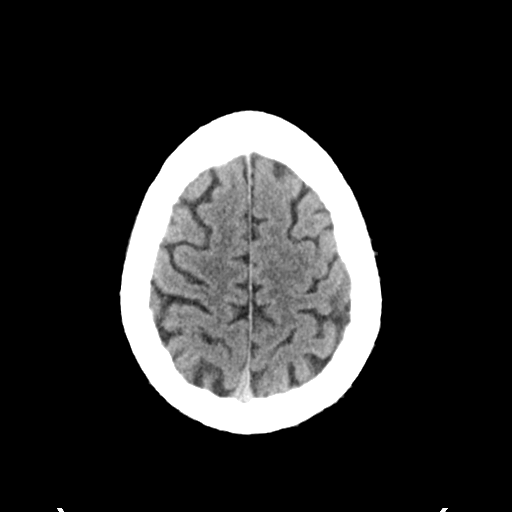
[im 29/34  brain]
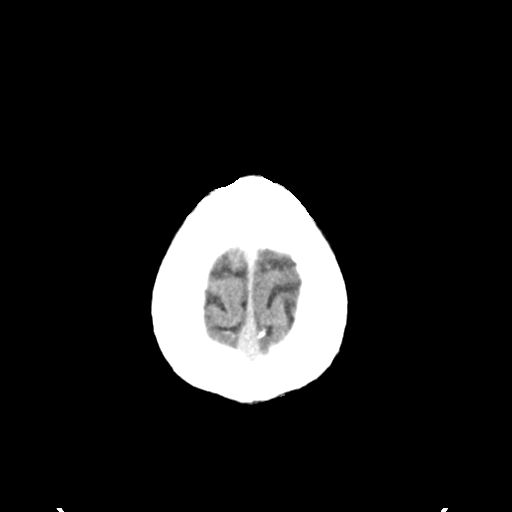

[Series 4: head bone · axial · 0.49mm/px · z∈[-89,-73]mm · 2 of 83 slices shown]
[im 9/83  bone]
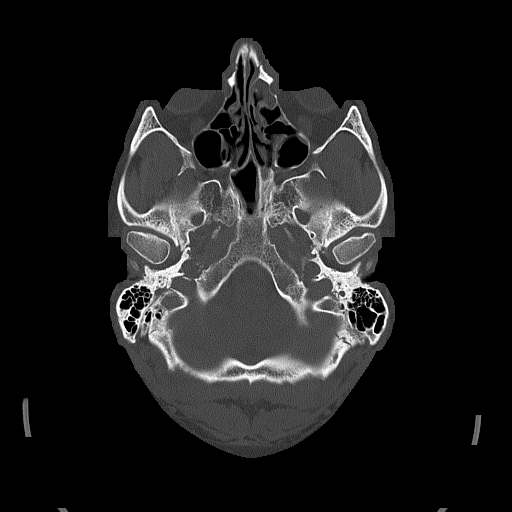
[im 17/83  bone]
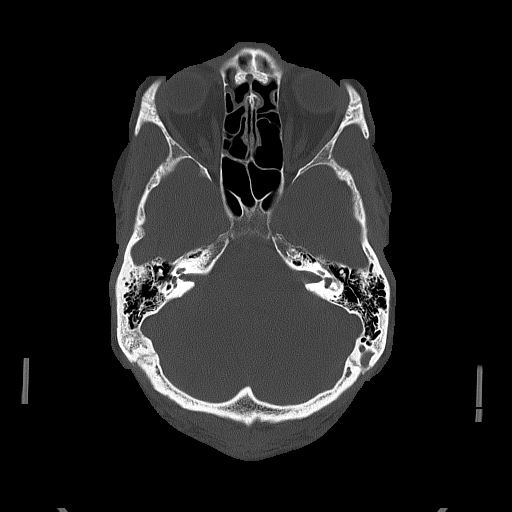

[Series 5: head without cor · coronal · non-contrast · 0.32mm/px · 3 of 76 slices shown]
[im 26/76  brain]
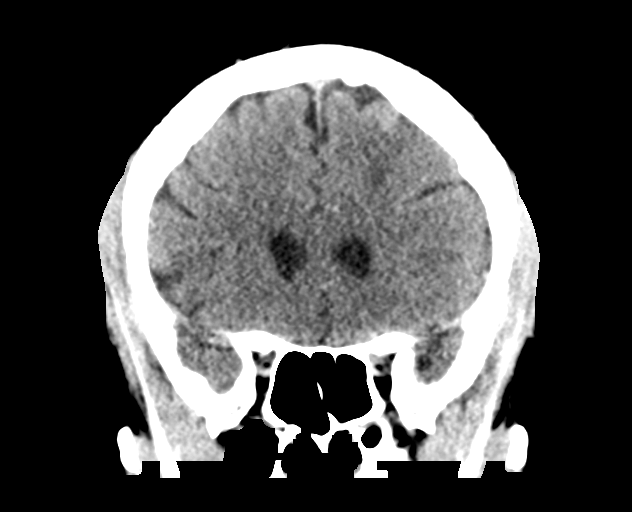
[im 34/76  brain]
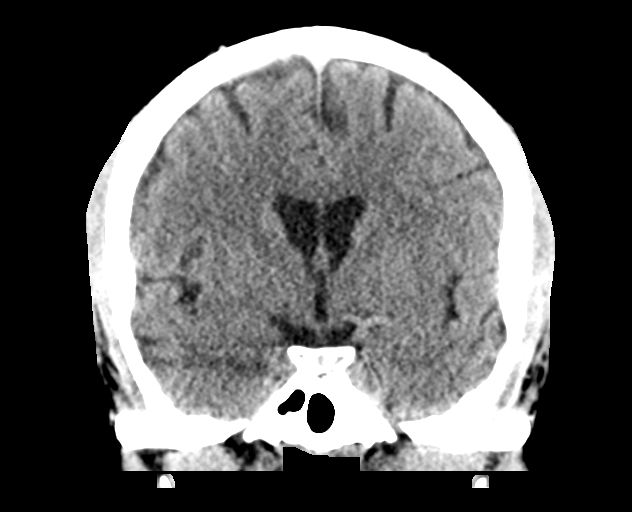
[im 42/76  brain]
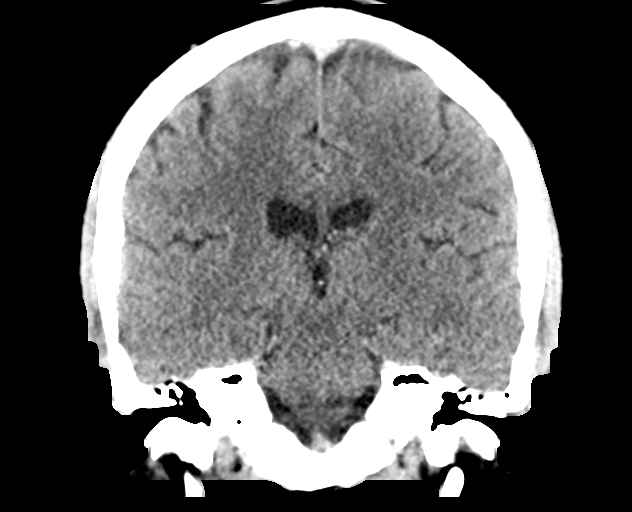

[Series 6: head without sag · sagittal · non-contrast · 0.31mm/px · 3 of 64 slices shown]
[im 22/64  brain]
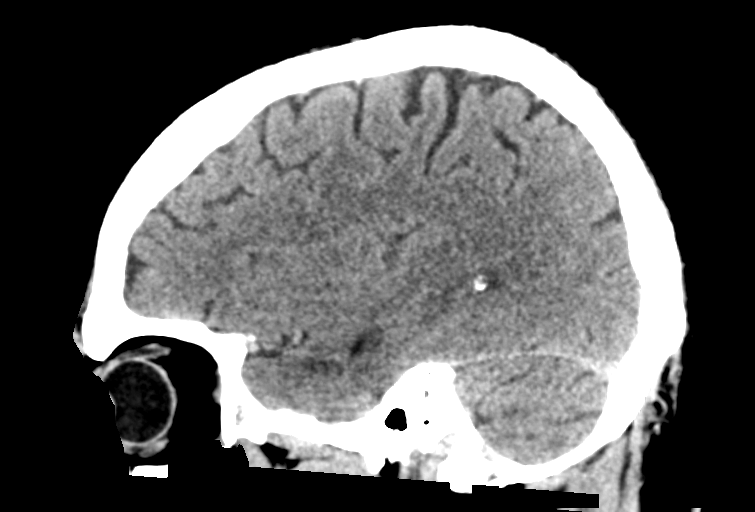
[im 32/64  brain]
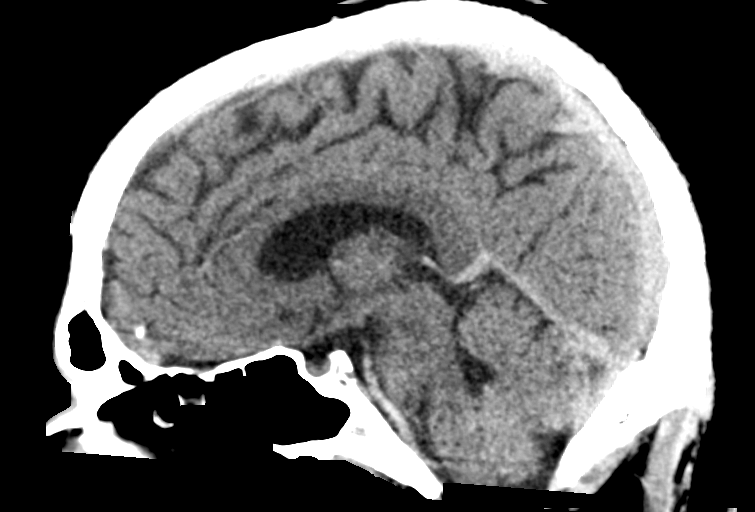
[im 43/64  brain]
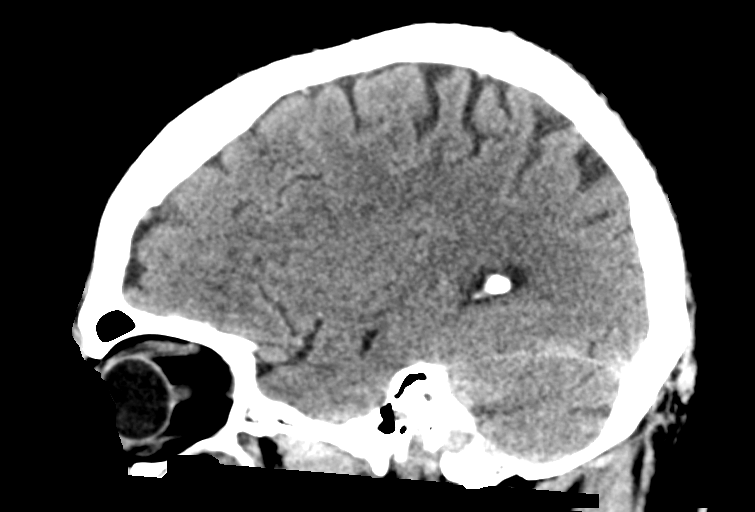

[15 of 47 positions shown; findings below may reference images not displayed]

FINDINGS: Brain: There is no acute intracranial hemorrhage or mass effect.
Focal low-density in the left frontal subcortical white matter
(series 3, image 21). Gray-white differentiation is preserved. There
is no extra-axial fluid collection. Ventricles and sulci are within
normal limits in size and configuration.

Vascular: No hyperdense vessel or unexpected calcification.

Skull: Calvarium is unremarkable.

Sinuses/Orbits: Paranasal sinus mucosal thickening. Air-fluid level
and aerosolized secretions in the left maxillary sinus. Unremarkable
orbits.

Other: None.
IMPRESSION: No acute intracranial hemorrhage or evidence of acute infarction.

Area of low density in the left frontal white matter. Contrast
enhanced MRI is recommended exclude an underlying lesion.

Left maxillary sinus inflammatory changes. Acute sinusitis is
possible in the appropriate clinical setting.

## 2021-09-01 MED ORDER — GADOBUTROL 1 MMOL/ML IV SOLN
10.0000 mL | Freq: Once | INTRAVENOUS | Status: AC | PRN
  Administered 2021-09-01: 10 mL via INTRAVENOUS

## 2021-09-01 MED ORDER — METOPROLOL SUCCINATE ER 25 MG PO TB24
25.0000 mg | ORAL_TABLET | Freq: Every day | ORAL | Status: DC
  Administered 2021-09-02 – 2021-09-03 (×2): 25 mg via ORAL
  Filled 2021-09-01 (×3): qty 1

## 2021-09-01 MED ORDER — SENNOSIDES-DOCUSATE SODIUM 8.6-50 MG PO TABS: 1.0000 | ORAL_TABLET | Freq: Every evening | ORAL | Status: DC | PRN

## 2021-09-01 MED ORDER — DAPAGLIFLOZIN PROPANEDIOL 10 MG PO TABS
10.0000 mg | ORAL_TABLET | Freq: Every day | ORAL | Status: DC
  Administered 2021-09-02 – 2021-09-03 (×2): 10 mg via ORAL
  Filled 2021-09-01 (×3): qty 1

## 2021-09-01 MED ORDER — IVABRADINE HCL 5 MG PO TABS
5.0000 mg | ORAL_TABLET | Freq: Two times a day (BID) | ORAL | Status: DC
  Administered 2021-09-02 – 2021-09-03 (×3): 5 mg via ORAL
  Filled 2021-09-01 (×4): qty 1

## 2021-09-01 MED ORDER — DIGOXIN 125 MCG PO TABS: 0.1250 mg | ORAL_TABLET | Freq: Every day | ORAL | Status: DC

## 2021-09-01 MED ORDER — METOCLOPRAMIDE HCL 5 MG/ML IJ SOLN
10.0000 mg | Freq: Once | INTRAMUSCULAR | Status: AC
  Administered 2021-09-01: 10 mg via INTRAVENOUS
  Filled 2021-09-01: qty 2

## 2021-09-01 MED ORDER — DIGOXIN 125 MCG PO TABS
0.1250 mg | ORAL_TABLET | Freq: Every day | ORAL | Status: DC
  Administered 2021-09-02 – 2021-09-03 (×2): 0.125 mg via ORAL
  Filled 2021-09-01 (×2): qty 1

## 2021-09-01 MED ORDER — ACETAMINOPHEN 160 MG/5ML PO SOLN: 650.0000 mg | ORAL | Status: DC | PRN

## 2021-09-01 MED ORDER — STROKE: EARLY STAGES OF RECOVERY BOOK: Freq: Once | Status: DC

## 2021-09-01 MED ORDER — ALBUTEROL SULFATE HFA 108 (90 BASE) MCG/ACT IN AERS: 2.0000 | INHALATION_SPRAY | RESPIRATORY_TRACT | Status: DC | PRN

## 2021-09-01 MED ORDER — ALBUTEROL SULFATE (2.5 MG/3ML) 0.083% IN NEBU: 2.5000 mg | INHALATION_SOLUTION | RESPIRATORY_TRACT | Status: DC | PRN

## 2021-09-01 MED ORDER — DEXAMETHASONE SODIUM PHOSPHATE 10 MG/ML IJ SOLN
10.0000 mg | Freq: Once | INTRAMUSCULAR | Status: AC
  Administered 2021-09-01: 10 mg via INTRAVENOUS
  Filled 2021-09-01: qty 1

## 2021-09-01 MED ORDER — HEPARIN (PORCINE) 25000 UT/250ML-% IV SOLN
1750.0000 [IU]/h | INTRAVENOUS | Status: DC
  Administered 2021-09-01: 20:00:00 2000 [IU]/h via INTRAVENOUS
  Administered 2021-09-02: 1850 [IU]/h via INTRAVENOUS
  Filled 2021-09-01 (×2): qty 250

## 2021-09-01 MED ORDER — ACETAMINOPHEN 325 MG PO TABS: 650.0000 mg | ORAL_TABLET | ORAL | Status: DC | PRN

## 2021-09-01 MED ORDER — SPIRONOLACTONE 25 MG PO TABS
25.0000 mg | ORAL_TABLET | Freq: Every day | ORAL | Status: DC
  Administered 2021-09-02 – 2021-09-03 (×2): 25 mg via ORAL
  Filled 2021-09-01 (×3): qty 1

## 2021-09-01 MED ORDER — SACUBITRIL-VALSARTAN 24-26 MG PO TABS
1.0000 | ORAL_TABLET | Freq: Two times a day (BID) | ORAL | Status: DC
  Administered 2021-09-02 – 2021-09-03 (×3): 1 via ORAL
  Filled 2021-09-01 (×3): qty 1

## 2021-09-01 MED ORDER — ACETAMINOPHEN 650 MG RE SUPP: 650.0000 mg | RECTAL | Status: DC | PRN

## 2021-09-01 NOTE — Progress Notes (Signed)
ANTICOAGULATION CONSULT NOTE - Initial Consult  Pharmacy Consult for Heparin Indication: LV Thrombus / Acute & Subacute infarcts on MR Brain  Allergies  Allergen Reactions   Bactrim [Sulfamethoxazole-Trimethoprim] Other (See Comments)    Blood Capillaries Rupture     Patient Measurements:   Heparin Dosing Weight: 108 kg  Vital Signs: Temp: 98.5 F (36.9 C) (02/24 1403) Temp Source: Oral (02/24 1403) BP: 112/59 (02/24 1851) Pulse Rate: 89 (02/24 1851)  Labs: Recent Labs    08/30/21 0159 08/31/21 0205 09/01/21 1414  HGB 17.4* 17.8* 18.1*  HCT 53.2* 53.3* 54.5*  PLT 311 251 248  APTT  --   --  36  LABPROT 14.1 15.3* 18.4*  INR 1.1 1.2 1.5*  HEPARINUNFRC 0.32 <0.10*  --   CREATININE 0.96 0.98 1.09    Estimated Creatinine Clearance: 120.1 mL/min (by C-G formula based on SCr of 1.09 mg/dL).   Medical History: Past Medical History:  Diagnosis Date   CHF (congestive heart failure) (HCC)     Medications:  (Not in a hospital admission)  Scheduled:  Infusions:   Assessment: 34 yom with a history of recent diagnosis of new onset systolic heart failure EF 20%, apical LV thrombus, new diagnosis of breast mass with axillary, mediastinal, and hilar lymphadenopathy, recent apical thrombus secondary to systolic heart failure - discharged on warfarin/lovenox at that time. Patient's last dose of warfarin was 2/24 and of enoxaparin was 2/24.  Patient is presenting with stroke like symptoms. Heparin per pharmacy consult placed for   LV Thrombus / Acute & Subacute infarcts on MR Brain .  Notably, patient on heparin last admission and was titrated up to 2700 units/hr without a therapeutic level. Was periodically therapeutic at 2200 units/hr.  Hgb 18.1; plt 248 PT/INR 18.4/1.5  Goal of Therapy:  Heparin level 0.3-0.5 units/ml Monitor platelets by anticoagulation protocol: Yes   Plan:  No boluses Discussed with neurology plan for resuming heparin given complexity of case and  MRI findings. MD Rory Percy ok with resuming heparin rate at 2000 units/hr Check anti-Xa level in 6 hours and daily while on heparin Continue to monitor H&H and platelets  Lorelei Pont, PharmD, BCPS 09/01/2021 7:00 PM ED Clinical Pharmacist -  [phone]

## 2021-09-01 NOTE — H&P (Signed)
History and Physical    Patient: Michael Stuart NAT:[id number] DOB: Jul 30, 1980 DOA: 09/01/2021 DOS: the patient was seen and examined on 09/01/2021 PCP: Pcp, No  Patient coming from: Home  Chief Complaint:  Chief Complaint  Patient presents with   Stroke Symptoms    HPI: Michael Stuart is a 41 y.o. male with medical history significant of  systolic heart failure with an EF of 20% and apical LV thrombus, new diagnosis of breast mass with axillary mediastinal and hilar lymphadenopathy who had been worked up for the cardiac issues because of ongoing dyspnea since December 2022 and discharged yesterday on anticoagulation. He presented to ER today sudden transient episode of left-sided facial numbness and left vision change that occurred this morning.  He reports the symptoms lasted for a brief time and has since resolved.  His mother states that the left side of his mouth did not raise when he smiled earlier but now is normal.  He denies having any numbness in his extremities.  In the emergency room MRI of the brain showed acute and subacute patchy restricted diffusion in the left frontal lobe left temporal lobe right occipital lobe and left occipital lobe as well as in the right thalamus.  He has been on Lovenox to bridge to Coumadin since he left and he is wearing a LifeVest for ventricular arrhythmia risk per cardiology when he was discharged yesterday.  He smokes 1/2 pack a day but states he has not smoked in over a week. He has been hemodynamically stable in the emergency room.  He has been evaluated by neurology in the emergency room and has been started on heparin infusion per the neurology recommendation.  Neurology is going to consult cardiology they state for further evaluation and adjustment of medications.  Patient will probably need to be changed to Evaro  Review of Systems: As mentioned in the history of present illness. All other systems reviewed and are negative. Past Medical History:   Diagnosis Date   CHF (congestive heart failure) (HCC)    Past Surgical History:  Procedure Laterality Date   RIGHT/LEFT HEART CATH AND CORONARY ANGIOGRAPHY N/A 08/28/2021   Procedure: RIGHT/LEFT HEART CATH AND CORONARY ANGIOGRAPHY;  Surgeon: Nigel Mormon, MD;  Location: [facility];  Service: Cardiovascular;  Laterality: N/A;   Social History:  reports that he has been smoking cigarettes. He has a 6.25 pack-year smoking history. He uses smokeless tobacco. He reports current alcohol use of about 21.0 standard drinks per week. He reports current drug use. Frequency: 1.00 time per week. Drug: Marijuana.  Allergies  Allergen Reactions   Bactrim [Sulfamethoxazole-Trimethoprim] Other (See Comments)    Blood Capillaries Rupture     Family History  Problem Relation Age of Onset   Healthy Mother     Prior to Admission medications   Medication Sig Start Date End Date Taking? Authorizing Provider  acetaminophen (TYLENOL) 500 MG tablet Take 1,000 mg by mouth every 6 (six) hours as needed for moderate pain.    [provider]  albuterol (VENTOLIN HFA) 108 (90 Base) MCG/ACT inhaler Inhale 1-2 puffs into the lungs every 6 (six) hours as needed for wheezing or shortness of breath. 08/14/21   [name], FNP  antiseptic oral rinse (BIOTENE) LIQD 15 mLs by Mouth Rinse route as needed for dry mouth.    [provider]  benzonatate (TESSALON) 100 MG capsule Take 1 capsule (100 mg total) by mouth every 8 (eight) hours as needed for cough.  Patient not taking: Reported on 08/27/2021 08/14/21   [name], FNP  dapagliflozin propanediol (FARXIGA) 10 MG TABS tablet Take 1 tablet (10 mg total) by mouth daily. 09/01/21   Patwardhan, Reynold Bowen, MD  digoxin (LANOXIN) 0.125 MG tablet Take 1 tablet (0.125 mg total) by mouth daily. 09/01/21   Patwardhan, Manish J, MD  enoxaparin (LOVENOX) 120 MG/0.8ML injection Inject 1 syringe (120 mg total) into the skin every 12 (twelve) hours.  08/31/21   Patwardhan, Reynold Bowen, MD  eplerenone (INSPRA) 25 MG tablet Take 2 tablets (50 mg total) by mouth daily. 09/01/21   Patwardhan, Reynold Bowen, MD  ivabradine (CORLANOR) 5 MG TABS tablet Take 1 tablet (5 mg total) by mouth 2 (two) times daily with a meal. 08/31/21   Patwardhan, Manish J, MD  metoprolol succinate (TOPROL-XL) 25 MG 24 hr tablet Take 1 tablet (25 mg total) by mouth daily. 09/01/21   Patwardhan, Reynold Bowen, MD  PROMETHAZINE HCL PO Take 1 capsule by mouth as needed (sleep).    [provider]  sacubitril-valsartan (ENTRESTO) 24-26 MG Take 1 tablet by mouth 2 (two) times daily. 08/31/21   Patwardhan, Reynold Bowen, MD  warfarin (COUMADIN) 2.5 MG tablet Take 5 tablets (12.5 mg total) by mouth today, then take 4 tablets (10mg ) daily or as directed following INR lab 08/31/21   Nigel Mormon, MD    Physical Exam: Vitals:   09/01/21 1900 09/01/21 1915 09/01/21 1930 09/01/21 2000  BP: 110/68 113/78 117/88 114/68  Pulse: 97 85 85 80  Resp:    16  Temp:      TempSrc:      SpO2: 99% 98% 95% 96%   General:  Appears calm and comfortable and is in NAD Eyes:  PERRL, EOMI, normal lids, iris ENT:  grossly normal hearing, lips & tongue, mmm; appropriate dentition Neck:  no LAD, masses or thyromegaly; no carotid bruits, +JVD Cardiovascular:  sinus tachycardia, no m/r/g. No LE edema.  Respiratory:   CTA bilaterally with no wheezes/rales/rhonchi.  Normal respiratory effort. Abdomen:  soft, NT, ND, NABS Back:   normal alignment, no CVAT Skin:  No Rash. Warm, dry intact Musculoskeletal:  grossly normal tone BUE/BLE, good ROM, no bony abnormality Lower extremity:  No LE edema.  Limited foot exam with no ulcerations.  2+ distal pulses. Psychiatric:  grossly normal mood and affect, speech fluent and appropriate, AOx3 Neurologic:  CN 2-12 grossly intact, moves all extremities in coordinated fashion, sensation intact  Data Reviewed: Lab work:   WBC 11,200 hemoglobin 18.1 hematocrit 54.5  platelets 248,000 sodium 135 potassium 4.8 chloride 102 bicarb 25 creatinine 1.09 BUN 10 glucose 91 albumin 3.4 alkaline phosphatase 54 AST 39 ALT 46 total bilirubin 0.7 INR 1.5    COVID pending  Assessment and Plan: * CVA (cerebral vascular accident) ([place])- (present on admission) Mr. Vantassell admitted  on cardiac telemetry floor for CVA.  Will evaluate carotids for large vessel occlusion/stenosis. Had echo 5 days ago so will not repeat Goal blood pressure is 120-180/80-90 with stroke on anticoagulation. Placed on heparin infusion Antiplatelet therapy with aspirin daily. Continue statin Neurochecks per stroke protocol Neurology consulted   Cardiomyopathy [facility]) Cardiology consulted by Neurology  Acute systolic congestive heart failure ([place])- (present on admission) Continue therapy with digoxin, farxiga, metoprolol, eplerenone, corlanor and entresto.  Cardiology consulted  Pure hypercholesterolemia- (present on admission) Continue statin  Tobacco user- (present on admission) Smokes 1/2 pack per day but has not smoked in past week. States does not need  nicotine patch.  Smoking cessation education to be provided before discharge  Advance Care Planning:    CODE STATUS-full code    on heparin infusion for anticoagulation per neurology recommendation  Consults: Neurology, Dr. Malen Gauze  Family Communication: Diagnosis and plan discussed with patient and his family who is in the room.  Otherwise understanding agree with plan.  Further recommendations to follow as clinically indicated  Author: Eben Burow, MD 09/01/2021 9:25 PM  For on call review [email or web address].

## 2021-09-01 NOTE — ED Provider Triage Note (Signed)
Emergency Medicine Provider Triage Evaluation Note  Michael Stuart , a 41 y.o. male  was evaluated in triage.  Pt complains of sudden onset of headache, left-sided facial numbness, and blurred vision.  Symptoms started approximately 1230 this afternoon.  Patient had a sudden onset of headache to the left side of his head.  Patient then developed numbness to the left side of his face that lasted for approximately 5 to 10 minutes.  Patient also developed blurred vision to bilateral eyes at the same time.  Patient reports that blurry vision is still present.  Patient had no facial asymmetry, numbness to bilateral arms or legs or weakness present.  Patient reports that his headache has gradually improved since starting.  Review of Systems  Positive: Facial numbness, headache, blurred vision Negative: Weakness to bilateral lower extremities, face asymmetry, dysarthria  Physical Exam  BP 106/79 (BP Location: Left Arm)    Pulse 91    Temp 98.5 F (36.9 C) (Oral)    Resp 16    SpO2 98%  Gen:   Awake, no distress   Resp:  Normal effort  MSK:   Moves extremities without difficulty  Other:  CN II through XII intact.  Pupils PERRL, EOM intact bilaterally.  Patient does have horizontal nystagmus to bilateral eyes.  grip strength equal, negative pronator drift, normal finger-to-nose movements.  Sensation grossly intact to bilateral upper and lower extremities.  Medical Decision Making  Medically screening exam initiated at 2:11 PM.  Appropriate orders placed.  Michael Stuart was informed that the remainder of the evaluation will be completed by another provider, this initial triage assessment does not replace that evaluation, and the importance of remaining in the ED until their evaluation is complete.  We will make patient level 2 and move back to next available room.   Michael Stuart, [place] 09/01/21 1413

## 2021-09-01 NOTE — Telephone Encounter (Signed)
Transitions of Care Pharmacy   Call attempted for a pharmacy transitions of care follow-up. Unable to reach patient, voicemail full  Call attempt #1. Will follow-up in 2-3 days.

## 2021-09-01 NOTE — ED Notes (Signed)
Patient transported to MRI 

## 2021-09-01 NOTE — Assessment & Plan Note (Addendum)
Placed on low dose statin therapy with instructions for follow up as outpatient.

## 2021-09-01 NOTE — Progress Notes (Unsigned)
Michael Stuart was contacted by telephone to verify understanding of discharge instructions status post their most recent discharge from the hospital on the date:  08/31/21.  Inpatient discharge AVS was re-reviewed with patient, along with cancer center appointments.  Verification of understanding for oncology specific follow-up was validated using the Teach Back method.    Transportation to appointments were confirmed for the patient as being self/caregiver.  Michael Stuart questions were addressed to their satisfaction upon completion of this post discharge follow-up call for outpatient oncology.

## 2021-09-01 NOTE — Telephone Encounter (Signed)
Known LV apical thrombus, discharged on warfarin/lovenox bridge. High suspicion for stroke. Recommend emergent ER evaluation.  Thanks MJP

## 2021-09-01 NOTE — Assessment & Plan Note (Addendum)
No clinical signs of heart failure decompensation.  Plan to continue therapy with digoxin,empagliflozin, metoprolol, eplerenone, ivabradine.  No need for furosemide at this point.  Follow up as outpatient as scheduled.

## 2021-09-01 NOTE — Consult Note (Signed)
Neurology Consultation  Reason for Consult: stroke Referring Physician: Dr Vivi Martens  CC: Left-sided facial numbness, loss of vision in the left eye with symptoms resolved  History is obtained from: Patient, chart  HPI: Michael Stuart is a 41 y.o. male past medical history of systolic heart failure with an EF of 20% and apical LV thrombus, new diagnosis of breast mass with axillary mediastinal and hilar lymphadenopathy who had been worked up for the cardiac issues because of ongoing dyspnea since December 2022 and discharged on anticoagulation, presents for evaluation of a transient episode of left-sided facial numbness and left eye vision loss sometime this morning.  Symptoms lasted briefly and have resolved. An MRI brain was performed because of the history of LV thrombus and now sudden onset of focal neurological symptoms which revealed acute and subacute patchy restricted diffusion in the left frontal lobe, left temporal lobe, right occipital lobe and left occipital lobe along with superomedial right thalamus.  He was discharged yesterday after evaluation of dyspnea, finding of new systolic heart failure and apical LV thrombus started on anticoagulation-Lovenox bridge to Coumadin-and on a LifeVest given risk of ventricular arrhythmias.  Oncological referral was made for the left breast mass.  Neurological consultation obtained for the new strokes  Denies any chest pain shortness of breath.  Denies any tingling numbness weakness.  Denies headache at this time.  LKW: 12:30 PM tpa given?: no, symptoms resolved Premorbid modified Rankin scale (mRS): 1-mainly due to dyspnea  ROS: Full ROS was performed and is negative except as noted in the HPI.   Past Medical History:  Diagnosis Date   CHF (congestive heart failure) (HCC)    Family History  Problem Relation Age of Onset   Healthy Mother    Social History:   reports that he has been smoking cigarettes. He has a 6.25 pack-year smoking  history. He uses smokeless tobacco. He reports current alcohol use of about 21.0 standard drinks per week. He reports current drug use. Frequency: 1.00 time per week. Drug: Marijuana.  Medications  Current Facility-Administered Medications:    heparin ADULT infusion 100 units/mL (25000 units/277mL), 2,000 Units/hr, Intravenous, Continuous, Amie Portland, MD, Last Rate: 20 mL/hr at 09/01/21 1954, 2,000 Units/hr at 09/01/21 1954  Current Outpatient Medications:    acetaminophen (TYLENOL) 500 MG tablet, Take 1,000 mg by mouth every 6 (six) hours as needed for moderate pain., Disp: , Rfl:    albuterol (VENTOLIN HFA) 108 (90 Base) MCG/ACT inhaler, Inhale 1-2 puffs into the lungs every 6 (six) hours as needed for wheezing or shortness of breath., Disp: 1 each, Rfl: 0   antiseptic oral rinse (BIOTENE) LIQD, 15 mLs by Mouth Rinse route as needed for dry mouth., Disp: , Rfl:    benzonatate (TESSALON) 100 MG capsule, Take 1 capsule (100 mg total) by mouth every 8 (eight) hours as needed for cough. (Patient not taking: Reported on 08/27/2021), Disp: 21 capsule, Rfl: 0   dapagliflozin propanediol (FARXIGA) 10 MG TABS tablet, Take 1 tablet (10 mg total) by mouth daily., Disp: 30 tablet, Rfl: 2   digoxin (LANOXIN) 0.125 MG tablet, Take 1 tablet (0.125 mg total) by mouth daily., Disp: 30 tablet, Rfl: 2   enoxaparin (LOVENOX) 120 MG/0.8ML injection, Inject 1 syringe (120 mg total) into the skin every 12 (twelve) hours., Disp: 22.4 mL, Rfl: 1   eplerenone (INSPRA) 25 MG tablet, Take 2 tablets (50 mg total) by mouth daily., Disp: 60 tablet, Rfl: 2   ivabradine (CORLANOR) 5 MG TABS  tablet, Take 1 tablet (5 mg total) by mouth 2 (two) times daily with a meal., Disp: 60 tablet, Rfl: 2   metoprolol succinate (TOPROL-XL) 25 MG 24 hr tablet, Take 1 tablet (25 mg total) by mouth daily., Disp: 30 tablet, Rfl: 2   PROMETHAZINE HCL PO, Take 1 capsule by mouth as needed (sleep)., Disp: , Rfl:    sacubitril-valsartan (ENTRESTO)  24-26 MG, Take 1 tablet by mouth 2 (two) times daily., Disp: 60 tablet, Rfl: 2   warfarin (COUMADIN) 2.5 MG tablet, Take 5 tablets (12.5 mg total) by mouth today, then take 4 tablets (10mg ) daily or as directed following INR lab, Disp: 150 tablet, Rfl: 2   Exam: Current vital signs: BP (!) 112/59    Pulse 89    Temp 98.5 F (36.9 C) (Oral)    Resp 18    SpO2 96%  Vital signs in last 24 hours: Temp:  [98.5 F (36.9 C)] 98.5 F (36.9 C) (02/24 1403) Pulse Rate:  [84-93] 89 (02/24 1851) Resp:  [16-23] 18 (02/24 1851) BP: (106-122)/(59-91) 112/59 (02/24 1851) SpO2:  [96 %-98 %] 96 % (02/24 1851) GENERAL: Awake, alert in NAD HEENT: - Normocephalic and atraumatic, dry mm, no LN++, no Thyromegally LUNGS - Clear to auscultation bilaterally with no wheezes CV - S1S2 RRR, no m/r/g, equal pulses bilaterally. ABDOMEN - Soft, nontender, nondistended with normoactive BS Ext: warm, well perfused, intact peripheral pulses-no edema  NEURO:  Mental Status: AA&Ox3  Language: speech is nondysarthric.  Naming, repetition, fluency, and comprehension intact. Cranial Nerves: PERRL EOMI, visual fields full, no facial asymmetry, facial sensation intact, hearing intact, tongue/uvula/soft palate midline, normal sternocleidomastoid and trapezius muscle strength. No evidence of tongue atrophy or fibrillations Motor: 5/5 with no drift Tone: is normal and bulk is normal Sensation- Intact to light touch bilaterally Coordination: FTN intact bilaterally, no ataxia in BLE. Gait- deferred  NIHSS--0   Labs I have reviewed labs in epic and the results pertinent to this consultation are: CBC    Component Value Date/Time   WBC 11.2 (H) 09/01/2021 1414   RBC 5.81 09/01/2021 1414   HGB 18.1 (H) 09/01/2021 1414   HCT 54.5 (H) 09/01/2021 1414   PLT 248 09/01/2021 1414   MCV 93.8 09/01/2021 1414   MCH 31.2 09/01/2021 1414   MCHC 33.2 09/01/2021 1414   RDW 14.0 09/01/2021 1414   LYMPHSABS 3.2 09/01/2021 1414    MONOABS 1.5 (H) 09/01/2021 1414   EOSABS 0.2 09/01/2021 1414   BASOSABS 0.1 09/01/2021 1414    CMP     Component Value Date/Time   NA 135 09/01/2021 1414   K 4.8 09/01/2021 1414   CL 102 09/01/2021 1414   CO2 25 09/01/2021 1414   GLUCOSE 91 09/01/2021 1414   BUN 10 09/01/2021 1414   CREATININE 1.09 09/01/2021 1414   CALCIUM 9.0 09/01/2021 1414   PROT 6.7 09/01/2021 1414   ALBUMIN 3.4 (L) 09/01/2021 1414   AST 39 09/01/2021 1414   ALT 46 (H) 09/01/2021 1414   ALKPHOS 54 09/01/2021 1414   BILITOT 0.7 09/01/2021 1414   GFRNONAA >60 09/01/2021 1414    Lipid Panel     Component Value Date/Time   CHOL 141 08/28/2021 0229   TRIG 109 08/28/2021 0229   HDL 40 (L) 08/28/2021 0229   CHOLHDL 3.5 08/28/2021 0229   VLDL 22 08/28/2021 0229   LDLCALC 79 08/28/2021 0229   2D echocardiogram from 08/27/2021 with LVEF 20 to 25% with global hypokinesis, left ventricular internal  cavity size dilated, grade 3 diastolic dysfunction restrictive.  Left atrium mild to moderate dilatation.  Right atrial size dilated.  Cardiac cath with normal coronary arteries  LDL 79 A1c 5.6  Imaging I have reviewed the images obtained:  MRI brain IMPRESSION: Patchy acute and subacute infarcts as described. Largest area of involvement is in the left frontal lobe corresponding to abnormality on CT where there is some petechial hemorrhage. Involvement of multiple vascular territories suggesting a central source.  No intracranial mass.  Assessment:  41 year old with above past medical history of a recent LV thrombus and new systolic heart failure presumed to be from a possible myocarditis following a respiratory infection in December 2022, breast mass with lymphadenopathy-oncological appointment scheduled, presenting for evaluation of left-sided facial numbness, left eye vision loss and an MRI that showed acute/subacute strokes in multiple vascular territories-anterior and posterior circulation on the right  and left hemispheres concerning for a cardioembolic stroke. Symptoms are completely resolved.  Examination with NIH 0 Facial symptoms are likely due to the thalamic infarct and transient left visual symptoms were probably from embolization into the left ophthalmic artery.  Strokes likely secondary to the LV thrombus. Had an echocardiogram done 5 days ago.  LDL and A1c were also checked.  Was on Lovenox bridge to Coumadin. At this time, will need anticoagulation to continue.   Impression: Acute ischemic strokes likely cardiomyopathy etiology from the LV thrombus and cardiomyopathy  Recommendations: Admit to hospitalist IV heparin stroke protocol-risks of heparinization specifically hemorrhagic transformation of the stroke are lower than the benefits-preventing further strokes at this time. I will request cardiology to reevaluate him for discussion on the choice of anticoagulant-may be needs consideration for DOAC. High intensity statin for goal LDL less than 70. Continue telemetry Frequent neurochecks PT OT Speech therapy Avoid hypotension.  Goal blood pressure from a stroke perspective should be systolic blood pressure not less than 120 and not higher than 180 given that he is on anticoagulation. Over the next few days the goal can be normotension. Stroke team will follow with you. Had a detailed discussion with mother and another family member at the bedside and answered all the questions. Discussed the plan with Dr. Zenia Resides in the ER.   -- Amie Portland, MD Neurologist [facility] Pager: [phone]

## 2021-09-01 NOTE — ED Provider Notes (Signed)
[facility] Provider Note  History   Chief Complaint  Patient presents with   Stroke Symptoms   Michael Stuart is a 41 y.o. male w/ h/o HFrEF (20-25%), apical LV thrombus (on lovenox/warfarin bridge), and new dx breast mass w/ axillary/mediastinal/hilar LAD who p/w sudden L HA, L face numbness, and L visual defect.  The history is provided by the patient and a parent.  Illness Location:  L face, L eye Quality:  L pressure HA, L fae numbness, L eye visual defect Severity:  Moderate Onset quality:  Sudden Duration: Brief intense HA and face numbess (few min), persistent visual defect. Timing:  Unable to specify Progression:  Unable to specify Chronicity:  New Context:  See MDM Associated symptoms: headaches   Associated symptoms: no abdominal pain, no chest pain, no congestion, no cough, no diarrhea, no fever, no nausea, no rash, no shortness of breath and no sore throat     Past Medical History:  Diagnosis Date   CHF (congestive heart failure) (HCC)     Social History   Tobacco Use   Smoking status: Every Day    Packs/day: 0.25    Years: 25.00    Pack years: 6.25    Types: Cigarettes   Smokeless tobacco: Current   Tobacco comments:    Smoked 2PPD x 10 years. Last 13 years 3-4 cigarettes/day.   Vaping Use   Vaping Use: Some days   Substances: Nicotine  Substance Use Topics   Alcohol use: Yes    Alcohol/week: 21.0 standard drinks    Types: 21 Cans of beer per week    Comment: 2 tall boys per day, everyday x 20years   Drug use: Yes    Frequency: 1.0 times per week    Types: Marijuana    Comment: 3x per month     Family History  Problem Relation Age of Onset   Healthy Mother     Review of Systems  Constitutional:  Negative for chills and fever.  HENT:  Negative for congestion and sore throat.   Eyes:  Positive for visual disturbance. Negative for photophobia and pain.  Respiratory:  Negative for cough and shortness of  breath.   Cardiovascular:  Negative for chest pain and palpitations.  Gastrointestinal:  Negative for abdominal pain, blood in stool, diarrhea and nausea.  Endocrine: Negative.   Genitourinary:  Negative for dysuria and hematuria.  Musculoskeletal:  Negative for neck pain and neck stiffness.  Skin:  Negative for rash and wound.  Allergic/Immunologic: Negative.   Neurological:  Positive for headaches. Negative for dizziness, seizures, syncope, speech difficulty and weakness.  Hematological: Negative.   Psychiatric/Behavioral: Negative.      Physical Exam   Today's Vitals   09/01/21 1403 09/01/21 1506  BP: 106/79 108/80  Pulse: 91 88  Resp: 16 17  Temp: 98.5 F (36.9 C)   TempSrc: Oral   SpO2: 98% 97%     Physical Exam Vitals and nursing note reviewed.  Constitutional:      General: He is not in acute distress.    Appearance: He is well-developed. He is obese. He is not ill-appearing.  HENT:     Head: Normocephalic and atraumatic.     Nose: Nose normal. No congestion.     Mouth/Throat:     Mouth: Mucous membranes are moist.     Pharynx: Oropharynx is clear. No oropharyngeal exudate.  Eyes:     Extraocular Movements: Extraocular movements intact.  Conjunctiva/sclera: Conjunctivae normal.     Pupils: Pupils are equal, round, and reactive to light.  Cardiovascular:     Rate and Rhythm: Normal rate and regular rhythm.     Pulses: Normal pulses.     Heart sounds: Normal heart sounds. No murmur heard. Pulmonary:     Effort: Pulmonary effort is normal. No respiratory distress.     Breath sounds: Normal breath sounds. No stridor. No wheezing, rhonchi or rales.  Abdominal:     Palpations: Abdomen is soft.     Tenderness: There is no abdominal tenderness. There is no right CVA tenderness, left CVA tenderness, guarding or rebound.  Musculoskeletal:        General: No swelling.     Cervical back: Normal range of motion and neck supple. No tenderness.     Right lower leg: No  edema.     Left lower leg: No edema.  Skin:    General: Skin is warm and dry.     Capillary Refill: Capillary refill takes less than 2 seconds.     Findings: No rash.  Neurological:     General: No focal deficit present.     Mental Status: He is alert and oriented to person, place, and time. Mental status is at baseline.     Cranial Nerves: No cranial nerve deficit.     Sensory: No sensory deficit.     Motor: No weakness.     Coordination: Coordination normal.  Psychiatric:        Mood and Affect: Mood normal.    ED Course  Procedures  Medical Decision Making:  Michael Stuart is a 41 y.o. male w/ h/o HFrEF (20-25%), apical LV thrombus (on lovenox/warfarin bridge), and new dx breast mass w/ axillary/mediastinal/hilar LAD who p/w sudden L HA, L face numbness, and L visual defect.  Recent cardiology admission with the above dx and tx Compliant with meds (including AC) Awoke in normal state of health this morning At 1215 developed acute, atraumatic L HA (around L orbit, pressure, brief) a/w L facial numbness (brief), and central dark spot in L visual field (no blurry vision, loss of vision, and can see clearly around it). No R eye involvement.   No syncope, no AMS ROS otherwise negative On arrival to ED, VSS, pt remains with dull L HA, no facial numbness, and persistent visual defect.  Nonfocal neuro exam. Visual acuity 20/20 bilaterally  Will obtain MR as suggested by [facility] obtained in triage.   ER provider interpretation of Imaging / Radiology:  CT H: No acute intracranial hemorrhage or evidence of acute infarction. Area of low density in the left frontal white matter. Contrast enhanced MRI is recommended exclude an underlying lesion.  MR brain w/ contrast: Patchy acute and subacute infarcts as described. Largest area of involvement is in the left frontal lobe corresponding to abnormality on CT where there is some petechial hemorrhage. Involvement of multiple vascular territories  suggesting a central source.  ER provider interpretation of EKG:  NSR, no STE or reciprocal changes  ER provider interpretation of Labs:  CBC: WBC 11.2, Hgb 18.1 CMP: No emergent electrolyte abnormalities or AKI Coags: PT 18.4, INR 1.5 Covid/flu: negative  Key medications administered in the ER:  Reglan and decadron for migraine cocktail Low dose heparin per pharmacy  Diagnoses considered:  Etiology likely acute and subacute infarcts per MR indicating central source (known LV thrombus). Ddx includes complex migraine.   Stroke mimics:  Unlikely seizures / postictal paralysis (no sz-like  activity preceeding, no h/o sz) Unlikely syncope (as has persistent  or associated neuro sxs) Unlikely meningitis / encephalitis (no fever, no meningismus) Unlikely brain neoplasm/abscess (no signs of infection, not c/w CTH) Unlikely epidural / subdural hematoma (no h/o trauma, no AC use, not c/w CTH) Unlikely SAH (no sudden onset worst HA of life) Unlikely hypoglycemia (no h/o DM, FSBS 91) Unlikely hyponatremia (no excessive free water intake, Na 135) Unlikely HTN encephalopathy (no global cerebral dysfunction, not gradual onset, BP 106/79) Unlikely hyperosmotic coma (no h/o DM, FSBS 91) Unlikely Wernicke's encephalopathy (no h/o alcoholism or malnutrition, no triad of ataxia / ophthalmoplegia / confusion) Unlikely labyrinthitis (not predominately vestibular symptoms, has other focal findings) Unlikely Bell's palsy (no isolated peripheral 7th nerve palsy) Unlikely Meniere's disease (no h/o recurrent episodes dominated by vertigo sxs, tinnitus, deafness) Unlikely demyelinating disease such as MS (no gradual onset, no h/o multiple episodes of neuro findings in multifocal anatomic distributions)  Consulted: Neuro   Spoke with neuro about MR findings. Inr 1.5. They recommend low dose heparin despite noted small area of petechial hemorrhage on MR as the risk of harm for hemorraghic bleed is  significantly lower than the benefits of preventing further strokes at this time. Neuro rec medicine admission, they will follow  Patient seen in conjunction with Dr. Eldridge Abrahams medical dictation software was used in the creation of this note.   Electronically signed by: Wynetta Fines, MD on 09/01/2021 at 4:23 PM  Clinical Impression:  1. Cerebral infarction, unspecified mechanism ([place])     Dispo: Imagene Riches, MD 09/02/21 1731    Lacretia Leigh, MD 09/04/21 1021

## 2021-09-01 NOTE — Assessment & Plan Note (Addendum)
Continue smoking cessation counseling.

## 2021-09-01 NOTE — ED Triage Notes (Addendum)
Pt here via EMS d/t stroke like symptoms. LKW 1230. Symptoms include, headache, left side facial numbness, left arm tingling. Symptoms resolved enroute except headache. Alert and oriented X4. New pacemaker placed and blood thinners just started.

## 2021-09-01 NOTE — ED Notes (Signed)
Headache and numbness have mostly resolved. Central vision improving, still blurry.

## 2021-09-01 NOTE — Telephone Encounter (Signed)
Pts wife called and stated that the pts left side of his face is numb and he has lost vision in his left eye. Pts BP is 104/83 HR 78 O2 98%. No arm or leg numbness. Informed pts wife to call EMS and have the patient go to the hospital. She voiced understanding.

## 2021-09-01 NOTE — Assessment & Plan Note (Addendum)
Patient was admitted to the medical ward and placed on a telemetry monitor.  He had improvement in his neurologic deficit.  Discussed with cardiology, Dr. Einar Gip, patient stable to transition to oral anticoagulation, per guideline is preferred warfarin but can be changed to [place].   Neurology consultation recommended, transitioning to apixaban. Patient was placed on statin therapy and instruction for outpatient follow up.

## 2021-09-01 NOTE — Assessment & Plan Note (Deleted)
Cardiology consulted by Neurology

## 2021-09-02 ENCOUNTER — Inpatient Hospital Stay (HOSPITAL_COMMUNITY): Payer: BC Managed Care – PPO

## 2021-09-02 DIAGNOSIS — I5022 Chronic systolic (congestive) heart failure: Secondary | ICD-10-CM

## 2021-09-02 DIAGNOSIS — I6340 Cerebral infarction due to embolism of unspecified cerebral artery: Secondary | ICD-10-CM | POA: Diagnosis not present

## 2021-09-02 DIAGNOSIS — E7800 Pure hypercholesterolemia, unspecified: Secondary | ICD-10-CM | POA: Diagnosis not present

## 2021-09-02 DIAGNOSIS — N6320 Unspecified lump in the left breast, unspecified quadrant: Secondary | ICD-10-CM | POA: Diagnosis not present

## 2021-09-02 DIAGNOSIS — Z720 Tobacco use: Secondary | ICD-10-CM

## 2021-09-02 LAB — BASIC METABOLIC PANEL
Anion gap: 9 (ref 5–15)
BUN: 12 mg/dL (ref 6–20)
CO2: 22 mmol/L (ref 22–32)
Calcium: 9.2 mg/dL (ref 8.9–10.3)
Chloride: 106 mmol/L (ref 98–111)
Creatinine, Ser: 0.96 mg/dL (ref 0.61–1.24)
GFR, Estimated: >60 mL/min (ref >60)
Glucose, Bld: 134 mg/dL — ABNORMAL HIGH (ref 70–99)
Potassium: 4.9 mmol/L (ref 3.5–5.1)
Sodium: 137 mmol/L (ref 135–145)

## 2021-09-02 LAB — HEPARIN LEVEL (UNFRACTIONATED)
Heparin Unfractionated: 0.81 IU/mL — ABNORMAL HIGH (ref 0.30–0.70)
Heparin Unfractionated: 0.73 IU/mL — ABNORMAL HIGH (ref 0.30–0.70)

## 2021-09-02 LAB — CBC
HCT: 49.6 % (ref 39.0–52.0)
Hemoglobin: 17.6 g/dL — ABNORMAL HIGH (ref 13.0–17.0)
MCH: 31.5 pg (ref 26.0–34.0)
MCHC: 35.5 g/dL (ref 30.0–36.0)
MCV: 88.9 fL (ref 80.0–100.0)
Platelets: 242 10*3/uL (ref 150–400)
RBC: 5.58 MIL/uL (ref 4.22–5.81)
RDW: 18.4 % — ABNORMAL HIGH (ref 11.5–15.5)
WBC: 7.7 10*3/uL (ref 4.0–10.5)
nRBC: 0 % (ref 0.0–0.2)

## 2021-09-02 MED ORDER — MELATONIN 5 MG PO TABS
10.0000 mg | ORAL_TABLET | Freq: Every evening | ORAL | Status: DC | PRN
Start: 2021-09-02 — End: 2021-09-03
  Administered 2021-09-02: 10 mg via ORAL
  Filled 2021-09-02: qty 2

## 2021-09-02 MED ORDER — WARFARIN SODIUM 5 MG PO TABS: 10.0000 mg | ORAL_TABLET | Freq: Once | ORAL | Status: DC

## 2021-09-02 MED ORDER — APIXABAN 5 MG PO TABS
10.0000 mg | ORAL_TABLET | Freq: Two times a day (BID) | ORAL | Status: DC
  Administered 2021-09-02 – 2021-09-03 (×3): 10 mg via ORAL
  Filled 2021-09-02 (×3): qty 2

## 2021-09-02 MED ORDER — APIXABAN 5 MG PO TABS: 5.0000 mg | ORAL_TABLET | Freq: Two times a day (BID) | ORAL | Status: DC

## 2021-09-02 MED ORDER — WARFARIN - PHARMACIST DOSING INPATIENT: Freq: Every day | Status: DC

## 2021-09-02 NOTE — Discharge Instructions (Signed)

## 2021-09-02 NOTE — Evaluation (Addendum)
Physical Therapy Evaluation/ Discharge Patient Details Name: Michael Stuart MRN: [id number] DOB: [birth year] Today's Date: 09/02/2021  History of Present Illness  41 yo admitted 2/24 with left facial  numbness and left vision loss with MRI demonstrating acute and subacute infarcts with patchy left frontal infarct. 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 Pt able to perform all gait, transfers and stairs without assist. Pt with strength, balance and sensation WFL. Pt with noted limited spots in vision with left eye and will defer further visual assessment to OT. No further acute P.T. needs with pt aware and agreeable, will sign off.   HR 95-120 with gait, brief rise to 154 but unsure of accuracy given lose lead and immediate correction at rest        Recommendations for follow up therapy are one component of a multi-disciplinary discharge planning process, led by the attending physician.  Recommendations may be updated based on patient status, additional functional criteria and insurance authorization.  Follow Up Recommendations No PT follow up    Assistance Recommended at Discharge None  Patient can return home with the following       Equipment Recommendations None recommended by PT  Recommendations for Other Services       Functional Status Assessment Patient has not had a recent decline in their functional status     Precautions / Restrictions Precautions Precautions: Other (comment) Precaution Comments: SBP 120-180      Mobility  Bed Mobility Overal bed mobility: Independent                  Transfers Overall transfer level: Independent                      Ambulation/Gait Ambulation/Gait assistance: Independent Gait Distance (Feet): 600 Feet Assistive device: None Gait Pattern/deviations: WFL(Within Functional Limits)   Gait velocity  interpretation: >4.37 ft/sec, indicative of normal walking speed      Stairs Stairs: Yes Stairs assistance: Independent Stair Management: No rails Number of Stairs: 3    Wheelchair Mobility    Modified Rankin (Stroke Patients Only) Modified Rankin (Stroke Patients Only) Pre-Morbid Rankin Score: No symptoms Modified Rankin: No significant disability     Balance Overall balance assessment: No apparent balance deficits (not formally assessed)                                           Pertinent Vitals/Pain Pain Assessment Pain Assessment: No/denies pain    Home Living Family/patient expects to be discharged to:: Private residence Living Arrangements: Parent Available Help at Discharge: Family Type of Home: House Home Access: Stairs to enter Entrance Stairs-Rails: Right Entrance Stairs-Number of Steps: 3 Alternate Level Stairs-Number of Steps: 14 Home Layout: Two level;Bed/bath upstairs Home Equipment: Rolling Walker (2 wheels);BSC/3in1;Cane - single point;Crutches Additional Comments: DME at home from mom. Works as a Scientist, product/process development Prior Level of Function : Independent/Modified Independent                     Journalist, newspaper        Extremity/Trunk Assessment   Upper Extremity Assessment Upper Extremity Assessment: Overall WFL for tasks assessed    Lower Extremity Assessment Lower Extremity Assessment: Overall WFL for tasks  assessed    Cervical / Trunk Assessment Cervical / Trunk Assessment: Normal  Communication      Cognition Arousal/Alertness: Awake/alert Behavior During Therapy: WFL for tasks assessed/performed Overall Cognitive Status: Within Functional Limits for tasks assessed                                          General Comments      Exercises     Assessment/Plan    PT Assessment Patient does not need any further PT services  PT Problem List         PT Treatment  Interventions      PT Goals (Current goals can be found in the Care Plan section)  Acute Rehab PT Goals PT Goal Formulation: All assessment and education complete, DC therapy    Frequency       Co-evaluation               AM-PAC PT "6 Clicks" Mobility  Outcome Measure Help needed turning from your back to your side while in a flat bed without using bedrails?: None Help needed moving from lying on your back to sitting on the side of a flat bed without using bedrails?: None Help needed moving to and from a bed to a chair (including a wheelchair)?: None Help needed standing up from a chair using your arms (e.g., wheelchair or bedside chair)?: None Help needed to walk in hospital room?: None Help needed climbing 3-5 steps with a railing? : None 6 Click Score: 24    End of Session   Activity Tolerance: Patient tolerated treatment well Patient left: in bed;with call bell/phone within reach Nurse Communication: Mobility status PT Visit Diagnosis: Other abnormalities of gait and mobility (R26.89)    Time: 4503-8882 PT Time Calculation (min) (ACUTE ONLY): 25 min   Charges:   PT Evaluation $PT Eval Moderate Complexity: 1 Mod          [name] P, PT Acute Rehabilitation Services Pager: [phone] Office: [name] 09/02/2021, 10:02 AM

## 2021-09-02 NOTE — Progress Notes (Signed)
Progress Note   Patient: Michael Stuart AUQ:[id number] DOB: [birth year] DOA: 09/01/2021     1 DOS: the patient was seen and examined on 09/02/2021   Brief hospital course: Michael Stuart was admitted to the hospital with the working diagnosis of acute CVA.   41 yo male with the past medical history of systolic heart failure with LV EF 20% with apical left ventricle thrombus, recently hospitalized 02/19 to 08/31/21 for heart failure exacerbation with newly diagnosed left ventricle apical thrombus. He was discharged on enoxaparin bridging and warfarin with a discharge INR of 1.2. He also has diagnosis of left breast mass with axillary and mediastinal lymphadenopathy.  At home patient had acute onset of left sided facial numbness and left facial droop with left vision changes. Brief duration of symptoms. On his initial physical examination his blood pressure was 110/68, HR 97, RR 16 and 02 saturation 98%, his lungs were clear to auscultation, heart with S1 and S2 present and rhythmic, abdomen soft and no lower extremity edema. Patient was neurologically not focal.   Na 135, K 4,8, CL 102, bicarbonate 25, glucose 91 bun 10 and cr 1,09 Wbc 11.2, hgb 18.1 hct 54.5, plt 248  INR 1,5  SARS covid 19 negative   Head CT with no acute changes.   EKG 93 bpm, normal axis, normal intervals, sinus rhythm with left atrial enlargement, no significant ST segment or T wave changes.   Brain MRI with patchy acute and subacute infarcts.  Cortical and subcortical left grater than right occipital lobes and left posterior temporal lobe with involvement of the superomedial right thalamus, posterior left periventricular white matter and right frontal lobe reduced diffusion.   Patient was placed on IV heparin and neurology was consulted.   Assessment and Plan: * CVA (cerebral vascular accident) ([place])- (present on admission) Patient with improvement in his neurologic deficit. Currently on heparin drip for anticoagulation.  INR  is 1,5   Plan to continue blood pressure control and statin therapy. Discussed with cardiology, Dr. Einar Gip, patient stable to transition to oral anticoagulation, per guideline is preferred warfarin but can be changed to [name].  Note that at the time of CVA INR was not therapeutic.  I will be more in favor to continue with warfarin per guideline recommendations.  Case discussed with Dr Earley Favor and will plan to continue anticoagulation with warfarin. Will keep patient hospitalized on heparin drip until INR 2 for 48 hrs.      Chronic systolic CHF (congestive heart failure) ([place])- (present on admission) No clinical signs of heart failure decompensation.  Plan to continue therapy with digoxin,empagliflozin, metoprolol, eplerenone, ivabradine.  No need for furosemide at this point.   Pure hypercholesterolemia- (present on admission) Will start patient on low dose statin therapy for now.   Tobacco user- (present on admission) Continue smoking cessation counseling.   Left breast mass- (present on admission) Patient needs outpatient follow up with oncology         Subjective: Patient with resolved neurologic deficit, with no dyspnea or lower extremity edema,   Physical Exam: Vitals:   09/02/21 0623 09/02/21 0734 09/02/21 0845 09/02/21 0853  BP: 104/77 108/80  (!) 143/99  Pulse: 80 85 85 85  Resp: 16 20    Temp: (!) 97 F (36.1 C) 98 F (36.7 C)    TempSrc: Oral Oral    SpO2: 96% 100%    Weight: 113.7 kg     Height: 6\' 3"  (1.905 m)      Neurology  patient with no focality, numbness and vision changes have resolved ENT with no pallor Cardiovascular with S1 and S2 present and rhythmic with no gallops or murmurs.  No JVD  No lower extremity edema Respiratory with no wheezing, rales or rhonchi Abdomen soft and non tender   Data Reviewed:    Family Communication: I spoke with patient's mother at the bedside, we talked in detail about patient's condition, plan of care and  prognosis and all questions were addressed.   Disposition: Status is: Inpatient Remains inpatient appropriate because: heparin IV and anticoagulation with warfarin      Planned Discharge Destination: Home   Author: Tawni Millers, MD 09/02/2021 12:36 PM  For on call review [email or web address].

## 2021-09-02 NOTE — TOC Progression Note (Signed)
Transition of Care [facility]) - Progression Note    Patient Details  Name: LENN VOLKER MRN: [id number] Date of Birth: [birth year]  Transition of Care [facility]) CM/SW Contact  Zenon Mayo, RN Phone Number: 09/02/2021, 4:08 PM  Clinical Narrative:     Transition of Care [facility]) Screening Note   Patient Details  Name: ANOTHY BUFANO Date of Birth: [birth year]   Transition of Care [facility]) CM/SW Contact:    Zenon Mayo, RN Phone Number: 09/02/2021, 4:08 PM    Transition of Care Department [facility]) has reviewed patient and no TOC needs have been identified at this time. We will continue to monitor patient advancement through interdisciplinary progression rounds. If new patient transition needs arise, please place a TOC consult.          Expected Discharge Plan and Services                                                 Social Determinants of Health (SDOH) Interventions    Readmission Risk Interventions No flowsheet data found.

## 2021-09-02 NOTE — Progress Notes (Signed)
ANTICOAGULATION CONSULT NOTE  Pharmacy Consult for Heparin Indication: LV Thrombus / Acute & Subacute infarcts on MR Brain  Allergies  Allergen Reactions   Bactrim [Sulfamethoxazole-Trimethoprim] Other (See Comments)    Blood Capillaries Rupture     Patient Measurements:   Heparin Dosing Weight: 108 kg  Vital Signs: BP: 92/62 (02/25 0330) Pulse Rate: 72 (02/25 0345)  Labs: Recent Labs    08/31/21 0205 09/01/21 1414 09/02/21 0237 09/02/21 0424  HGB 17.8* 18.1* 17.6*  --   HCT 53.3* 54.5* 49.6  --   PLT 251 248 242  --   APTT  --  36  --   --   LABPROT 15.3* 18.4*  --   --   INR 1.2 1.5*  --   --   HEPARINUNFRC <0.10*  --   --  0.81*  CREATININE 0.98 1.09  --   --      Estimated Creatinine Clearance: 120.1 mL/min (by C-G formula based on SCr of 1.09 mg/dL).   Medical History: Past Medical History:  Diagnosis Date   CHF (congestive heart failure) (HCC)      Assessment: 57 yom with a history of recent diagnosis of new onset systolic heart failure EF 20%, apical LV thrombus, new diagnosis of breast mass with axillary, mediastinal, and hilar lymphadenopathy, recent apical thrombus secondary to systolic heart failure - discharged on warfarin/lovenox at that time. Patient's last dose of warfarin was 2/24 and of enoxaparin was 2/24.  Patient is presenting with stroke like symptoms. Heparin per pharmacy consult placed for   LV Thrombus / Acute & Subacute infarcts on MR Brain .  Notably, patient on heparin last admission and was titrated up to 2700 units/hr without a therapeutic level. Was periodically therapeutic at 2200 units/hr.  Hgb 18.1; plt 248 PT/INR 18.4/1.5  2/25 AM update:  Heparin level above goal  Goal of Therapy:  Heparin level 0.3-0.5 units/ml Monitor platelets by anticoagulation protocol: Yes   Plan:  Dec heparin to 1850 units/hr 1300 heparin level  Narda Bonds, PharmD, BCPS Clinical Pharmacist Phone: [phone]

## 2021-09-02 NOTE — Progress Notes (Signed)
Carotid duplex has been completed.  Results can be found under chart review under CV PROC. 09/02/2021 11:23 AM Chau Sawin RVT, RDMS

## 2021-09-02 NOTE — Progress Notes (Addendum)
STROKE TEAM PROGRESS NOTE   INTERVAL HISTORY Seen in room, no family at the bedside. Cardiology on board. AV thrombus with a heparin drip.  Patient reports walking with son then eating lunch afterward when the left side of face had pins and needles and then went numb, had a black spot on his vision bilaterally that was previously central but now its more like a floater in just his left eye. Severe bilateral retroorbital headache when it initially happened. He is able to see around it. Denies weakness in his extremities, facial droop, and speech changes.  Was using lovenox at home since discharge 2 days ago.   CTH without acute changes, and MRI with acute/subacute patchy bilateral infarcts.   Vitals:   09/02/21 0500 09/02/21 0530 09/02/21 0623 09/02/21 0734  BP: 108/90 105/75 104/77 108/80  Pulse: 84 91 80 85  Resp: (!) 23 17 16 20   Temp:   (!) 97 F (36.1 C) 98 F (36.7 C)  TempSrc:   Oral Oral  SpO2: 97% 98% 96% 100%  Weight:   113.7 kg   Height:   6\' 3"  (1.905 m)    CBC:  Recent Labs  Lab 08/27/21 1103 08/28/21 0229 09/01/21 1414 09/02/21 0237  WBC 14.9*   < > 11.2* 7.7  NEUTROABS 11.6*  --  6.1  --   HGB 15.5   < > 18.1* 17.6*  HCT 49.0   < > 54.5* 49.6  MCV 97.2   < > 93.8 88.9  PLT 367   < > 248 242   < > = values in this interval not displayed.   Basic Metabolic Panel:  Recent Labs  Lab 08/27/21 1103 08/27/21 1514 08/28/21 0229 09/01/21 1414 09/02/21 0646  NA 137  --    < > 135 137  K 4.7  --    < > 4.8 4.9  CL 105  --    < > 102 106  CO2 22  --    < > 25 22  GLUCOSE 101*  --    < > 91 134*  BUN 10  --    < > 10 12  CREATININE 1.18  --    < > 1.09 0.96  CALCIUM 8.9  --    < > 9.0 9.2  MG 1.8 1.9  --   --   --    < > = values in this interval not displayed.   Lipid Panel:  Recent Labs  Lab 08/28/21 0229  CHOL 141  TRIG 109  HDL 40*  CHOLHDL 3.5  VLDL 22  LDLCALC 79   HgbA1c:  Recent Labs  Lab 08/27/21 1246  HGBA1C 5.6   Urine Drug Screen:   Recent Labs  Lab 08/27/21 1246  LABOPIA NONE DETECTED  COCAINSCRNUR NONE DETECTED  LABBENZ NONE DETECTED  AMPHETMU NONE DETECTED  THCU NONE DETECTED  LABBARB NONE DETECTED    Alcohol Level No results for input(s): ETH in the last 168 hours.  IMAGING past 24 hours CT HEAD WO CONTRAST (5MM)  Result Date: 09/01/2021 CLINICAL DATA:  Neuro deficit, acute, stroke suspected; right facial numbness and blurry vision EXAM: CT HEAD WITHOUT CONTRAST TECHNIQUE: Contiguous axial images were obtained from the base of the skull through the vertex without intravenous contrast. RADIATION DOSE REDUCTION: This exam was performed according to the departmental dose-optimization program which includes automated exposure control, adjustment of the mA and/or kV according to patient size and/or use of iterative reconstruction technique. COMPARISON:  None. FINDINGS:  Brain: There is no acute intracranial hemorrhage or mass effect. Focal low-density in the left frontal subcortical white matter (series 3, image 21). Gray-white differentiation is preserved. There is no extra-axial fluid collection. Ventricles and sulci are within normal limits in size and configuration. Vascular: No hyperdense vessel or unexpected calcification. Skull: Calvarium is unremarkable. Sinuses/Orbits: Paranasal sinus mucosal thickening. Air-fluid level and aerosolized secretions in the left maxillary sinus. Unremarkable orbits. Other: None. IMPRESSION: No acute intracranial hemorrhage or evidence of acute infarction. Area of low density in the left frontal white matter. Contrast enhanced MRI is recommended exclude an underlying lesion. Left maxillary sinus inflammatory changes. Acute sinusitis is possible in the appropriate clinical setting. Electronically Signed   By: Macy Mis M.D.   On: 09/01/2021 14:36   MR Brain W and Wo Contrast  Result Date: 09/01/2021 CLINICAL DATA:  Abnormal head CT EXAM: MRI HEAD WITHOUT AND WITH CONTRAST TECHNIQUE:  Multiplanar, multiecho pulse sequences of the brain and surrounding structures were obtained without and with intravenous contrast. CONTRAST:  74mL GADAVIST GADOBUTROL 1 MMOL/ML IV SOLN COMPARISON:  CT head earlier same day FINDINGS: Brain: There is cortical/subcortical mildly reduced diffusion in the left frontal lobe corresponding to abnormality on CT. There is some associated susceptibility likely reflecting petechial hemorrhage. Minimal curvilinear enhancement is present. Patchy cortical/subcortical reduced diffusion is also present in the left greater than right occipital lobes and left posterior temporal lobe with additional involvement of the superomedial right thalamus, posterior left periventricular white matter, and right frontal lobe along the medial precentral gyrus. There is associated enhancement in some of these areas. No mass effect. Ventricles and sulci are normal in size and configuration. A few additional small foci of T2 hyperintensity in the supratentorial white matter are nonspecific but may reflect minor chronic microvascular ischemic changes. Vascular: Major vessel flow voids at the skull base are preserved. Skull and upper cervical spine: Normal marrow signal is preserved. Sinuses/Orbits: Paranasal sinus inflammatory changes as noted on the prior CT. Orbits are unremarkable. Other: Sella is unremarkable.  Mastoid air cells are clear. IMPRESSION: Patchy acute and subacute infarcts as described. Largest area of involvement is in the left frontal lobe corresponding to abnormality on CT where there is some petechial hemorrhage. Involvement of multiple vascular territories suggesting a central source. No intracranial mass. Electronically Signed   By: Macy Mis M.D.   On: 09/01/2021 18:05    PHYSICAL EXAM  Physical Exam  Constitutional: Appears well-developed and well-nourished.  Psych: Affect appropriate to situation Eyes: No scleral injection  Skin: WDI  Neuro: Mental  Status: Patient is awake, alert, oriented to person, place, month, year, and situation. Patient is able to give a clear and coherent history. No signs of aphasia or neglect Cranial Nerves: II: Visual Fields are full. Pupils are equal, round, and reactive to light.  Patient reports seeing a floater in the L visual field of his L eye that is nonobstructive to his vision. III,IV, VI: EOMI without ptosis or diploplia.  V: Facial sensation is symmetric to touch VII: Facial movement is symmetric resting and smiling VIII: Hearing is intact to voice X: Palate elevates symmetrically XI: Shoulder shrug is symmetric. XII: Tongue protrudes midline without atrophy or fasciculations.  Motor: Tone is normal. Bulk is normal. 5/5 strength was present in all four extremities.  Sensory: Sensation is symmetric to light touch in the arms and legs. No extinction to DSS present.  Cerebellar: FNF and HKS are intact bilaterally Gait: Normal, without ataxia, instability  ASSESSMENT/PLAN Michael Stuart is a 41 y.o. male with history of systolic heart failure with EF of 20% and apical LV thrombus, new diagnosis of breast mass with axillary mediastinal and hilar lymphadenopathy, dyspnea since 2022 presenting with vision changes and left facial numbness.   Stroke:  bilateral acute/subacute patchy infarcts likely secondary to cardioembolic source Code Stroke CT head No acute abnormality. Area of low density in L frontal white matter.  MRI  Patchy bilateral acute/subacute infarcts; some petechial hemorrhage Carotid Doppler  Normal without vessel occlusion or flow disruption 2D Echo 2/19- LVEF 20-25% w/ severely dec LV function, Mildly dilated RV and LA, dilated RA. Mild MVR, tricuspid aortic valve.  LDL 79 HgbA1c 5.6 VTE prophylaxis - Lovenox    Diet   Diet NPO time specified   Lovenox 120 mg q12h  prior to admission, now on heparin IV to bridge to Warfarin daily.  Therapy recommendations:  No  follow-up Disposition:  Per primary team  Hypertension Home meds:  Eplerenone 50 mg daily, Metoprolol 25 mg, Entresto 24-26 mg BID Stable Avoid hypotension. Goal blood pressure from a stroke perspective should be systolic blood pressure not less than 120 and not higher than 180 given that he is on anticoagulation. Long-term BP goal normotensive  Hyperlipidemia Home meds:  N/A LDL 79, goal < 70 Recommend to add Lipitor 10 mg Continue statin at discharge  Diabetes type II Controlled Home meds:  Farxiga 10 mg HgbA1c 5.6, goal < 7.0 CBGs No results for input(s): GLUCAP in the last 72 hours.   CHF LVEF 20-25% Continue  home meds: digoxin, farxiga, metoprolol, eplerenone, corlanor and entresto.  Patient with Life Vest in place Per primary team and cardiology, patient to continue heparin bridge during this admission to transition to Warfarin prior to discharge. Agree with plan.  Other Stroke Risk Factors 1/2 ppd cigarette smoker, advised to stop smoking ETOH use, alcohol level No results found for requested labs within last 26280 hours., advised to drink no more than 1-2 drink(s) a day Substance abuse - UDS:  THC NONE DETECTED (patient reports use), Cocaine NONE DETECTED. Patient advised to stop using due to stroke risk. Obesity, Body mass index is 31.33 kg/m., BMI >/= 30 associated with increased stroke risk, recommend weight loss, diet and exercise as appropriate  Congestive heart failure  Other Active Problems Tobacco use: Patient offered patch and declined  Hospital day # 1    Rosezetta Schlatter, MD PGY-1 Neuro Psych Resident 09/02/2021  12:51 PM   ATTENDING ATTESTATION:  41 year old male past medical history of systolic heart failure EF 20% and apical left ventricular thrombus on recent echo recently hospitalized for heart failure exacerbation.  He was discharged with Lovenox bridging to Coumadin.  He is now readmitted with another stroke.  His exam is nonfocal and benign.   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  Despite this he had another stroke which is concerning.  In light of this and difficulties getting INR checks I discussed with the patient and his mother transitioning to Eliquis.  Cardiology was on board with either Coumadin or newer anticoagulation.  Discussed the plan with Dr. Cathlean Sauer. Discussed with Dr. Einar Gip over the phone. He was agreeable to higher treatment dose of eliquis and no aspirin.   Consulted pharmacy to help with transition and they will also do counseling for eliquis. Start at 10mg  BID x 7 days then 5mg  BID.  Dr. Reeves Forth evaluated  pt independently, reviewed imaging, chart, labs. Discussed and formulated plan with the APP. Please see APP note above for details.   Total 36 minutes spent on counseling patient and coordinating care, writing notes and reviewing chart.    Kiarah Eckstein,MD   To contact Stroke Continuity provider, please refer to [email or web address] After hours, contact General Neurology

## 2021-09-02 NOTE — Evaluation (Signed)
Occupational Therapy Evaluation and Discharge Patient Details Name: Michael Stuart MRN: [id number] DOB: 12/18/1980 Today's Date: 09/02/2021   History of Present Illness 41 yo admitted 2/24 with left facial  numbness and left vision loss with MRI demonstrating acute and subacute infarcts with patchy left frontal infarct. PMhx: HFrEF, breast mass, LV thrombus   Clinical Impression   Pt is functioning independently. Reports "black cloud" he experienced visually has improved from yesterday, now describes it as being smaller and gray. Vision slows reading speed and general visual processing. Educated in compensatory strategies, some of which pt has already employed and possible impact on job performance and driving. Pt verbalized understanding. Will await MD clearance before returning to driving. No further OT needs.     Recommendations for follow up therapy are one component of a multi-disciplinary discharge planning process, led by the attending physician.  Recommendations may be updated based on patient status, additional functional criteria and insurance authorization.   Follow Up Recommendations  No OT follow up    Assistance Recommended at Discharge None  Patient can return home with the following      Functional Status Assessment  Patient has had a recent decline in their functional status and demonstrates the ability to make significant improvements in function in a reasonable and predictable amount of time.  Equipment Recommendations  None recommended by OT    Recommendations for Other Services       Precautions / Restrictions Precautions Precautions: Other (comment) Precaution Comments: SBP 120-180      Mobility Bed Mobility Overal bed mobility: Independent                  Transfers Overall transfer level: Independent                        Balance Overall balance assessment: No apparent balance deficits (not formally assessed)                                          ADL either performed or assessed with clinical judgement   ADL Overall ADL's : Independent                                             Vision   Additional Comments: reports "gray cloud" in central vision     Perception     Praxis      Pertinent Vitals/Pain Pain Assessment Pain Assessment: No/denies pain     Hand Dominance Right   Extremity/Trunk Assessment Upper Extremity Assessment Upper Extremity Assessment: Overall WFL for tasks assessed   Lower Extremity Assessment Lower Extremity Assessment: Overall WFL for tasks assessed   Cervical / Trunk Assessment Cervical / Trunk Assessment: Normal   Communication Communication Communication: No difficulties   Cognition Arousal/Alertness: Awake/alert Behavior During Therapy: WFL for tasks assessed/performed Overall Cognitive Status: Within Functional Limits for tasks assessed                                       General Comments       Exercises     Shoulder Instructions      Home Living Family/patient expects to be discharged to::  Private residence Living Arrangements: Parent Available Help at Discharge: Family Type of Home: House Home Access: Stairs to enter Technical brewer of Steps: 3 Entrance Stairs-Rails: Right Home Layout: Two level;Bed/bath upstairs Alternate Level Stairs-Number of Steps: 14   Bathroom Shower/Tub: Teacher, early years/pre: Standard     Home Equipment: Conservation officer, nature (2 wheels);BSC/3in1;Cane - single point;Crutches   Additional Comments: DME at home from mom. Works as a Gaffer Prior Level of Function : Independent/Modified Independent                        OT Problem List: Impaired vision/perception      OT Treatment/Interventions:      OT Goals(Current goals can be found in the care plan section)    OT Frequency:       Co-evaluation              AM-PAC OT "6 Clicks" Daily Activity     Outcome Measure Help from another person eating meals?: None Help from another person taking care of personal grooming?: None Help from another person toileting, which includes using toliet, bedpan, or urinal?: None Help from another person bathing (including washing, rinsing, drying)?: None Help from another person to put on and taking off regular upper body clothing?: None Help from another person to put on and taking off regular lower body clothing?: None 6 Click Score: 24   End of Session    Activity Tolerance: Patient tolerated treatment well Patient left: in bed;with call bell/phone within reach  OT Visit Diagnosis: Other (comment) (vision changes)                Time: 3338-3291 OT Time Calculation (min): 19 min Charges:  OT General Charges $OT Visit: 1 Visit OT Evaluation $OT Eval Low Complexity: 1 Low  Nestor Lewandowsky, OTR/L Acute Rehabilitation Services Pager: [phone] Office: [phone]   Malka So 09/02/2021, 1:14 PM

## 2021-09-02 NOTE — Progress Notes (Signed)
ANTICOAGULATION CONSULT NOTE - Initial Consult  Pharmacy Consult for Warfarin dosing while bridging with heparin Indication:  CVA  Allergies  Allergen Reactions   Bactrim [Sulfamethoxazole-Trimethoprim] Other (See Comments)    Blood Capillaries Rupture     Patient Measurements: Height: 6\' 3"  (190.5 cm) Weight: 113.7 kg (250 lb 10.6 oz) IBW/kg (Calculated) : 84.5 Heparin Dosing Weight: 108 kg  Vital Signs: Temp: 98 F (36.7 C) (02/25 0734) Temp Source: Oral (02/25 0734) BP: 143/99 (02/25 0853) Pulse Rate: 85 (02/25 0853)  Labs: Recent Labs    08/31/21 0205 09/01/21 1414 09/02/21 0237 09/02/21 0424 09/02/21 0646  HGB 17.8* 18.1* 17.6*  --   --   HCT 53.3* 54.5* 49.6  --   --   PLT 251 248 242  --   --   APTT  --  36  --   --   --   LABPROT 15.3* 18.4*  --   --   --   INR 1.2 1.5*  --   --   --   HEPARINUNFRC <0.10*  --   --  0.81*  --   CREATININE 0.98 1.09  --   --  0.96    Estimated Creatinine Clearance: 136.4 mL/min (by C-G formula based on SCr of 0.96 mg/dL).   Medical History: Past Medical History:  Diagnosis Date   CHF (congestive heart failure) ([place])    Assessment: Patient presented this admission with stroke like symptoms. Heparin per pharmacy consult placed for LV Thrombus / Acute & Subacute infarcts on MR Brain. Patient is now cleared to start oral anticoagulation. Per MD, bridge warfarin and IV heparin gtt until INR >2 x48h.  INR was below goal at 1.5 yesterday. No doses of warfarin given this admission. Heparin level is supratherapeutic (0.73) @ 1850 units/hr. CBC stable, no s/sx of bleeding noted.   Goal of Therapy:  INR 2-3 Heparin level 0.3-0.5 units/ml Monitor platelets by anticoagulation protocol: Yes   Plan:  Warfarin 10 mg x1 Decrease heparin gtt to 1750 units/hr F/up 6h heparin level F/up daily INR Monitor CBC and s/sx of bleeding daily  Joseph Art, Pharm.D. PGY-1 Pharmacy Resident [phone] 09/02/2021 1:03 PM

## 2021-09-02 NOTE — Assessment & Plan Note (Addendum)
Patient needs outpatient follow up with oncology he has appointment with oncology as outpatient.

## 2021-09-02 NOTE — Progress Notes (Signed)
SLP Cancellation Note  Patient Details Name: Michael Stuart MRN: [id number] DOB: [birth year]   Cancelled treatment:       Reason Eval/Treat Not Completed: SLP screened, no needs identified, will sign off. Per chart review and discussion with evaluating OT, patient is not exhibiting any cognitive, speech or language deficits. SLP to s/o.  Thank you for this referral!  Sonia Baller, MA, CCC-SLP Speech Therapy

## 2021-09-02 NOTE — Hospital Course (Addendum)
Michael Stuart was admitted to the hospital with the working diagnosis of acute CVA.   41 yo male with the past medical history of systolic heart failure with LV EF 20% with apical left ventricle thrombus, recently hospitalized 02/19 to 08/31/21 for heart failure exacerbation with newly diagnosed left ventricle apical thrombus. He was discharged on enoxaparin bridging and warfarin with a discharge INR of 1.2. He also has diagnosis of left breast mass with axillary and mediastinal lymphadenopathy.  At home patient had acute onset of left sided facial numbness and left facial droop with left vision changes. Brief duration of symptoms. On his initial physical examination his blood pressure was 110/68, HR 97, RR 16 and 02 saturation 98%, his lungs were clear to auscultation, heart with S1 and S2 present and rhythmic, abdomen soft and no lower extremity edema. Patient was neurologically not focal.   Na 135, K 4,8, CL 102, bicarbonate 25, glucose 91 bun 10 and cr 1,09 Wbc 11.2, hgb 18.1 hct 54.5, plt 248  INR 1,5  SARS covid 19 negative   Head CT with no acute changes.   EKG 93 bpm, normal axis, normal intervals, sinus rhythm with left atrial enlargement, no significant ST segment or T wave changes.   Brain MRI with patchy acute and subacute infarcts.  Cortical and subcortical left grater than right occipital lobes and left posterior temporal lobe with involvement of the superomedial right thalamus, posterior left periventricular white matter and right frontal lobe reduced diffusion.   Patient was placed on IV heparin and neurology was consulted.  Decision was made to transition patient to apixaban for anticoagulation. At discharge his neurologic deficit resolved. Plan to follow up as outpatient.

## 2021-09-02 NOTE — Progress Notes (Signed)
ANTICOAGULATION CONSULT NOTE - Initial Consult  Pharmacy Consult for apixaban Indication:  CVA  Allergies  Allergen Reactions   Bactrim [Sulfamethoxazole-Trimethoprim] Other (See Comments)    Blood Capillaries Rupture     Patient Measurements: Height: 6\' 3"  (190.5 cm) Weight: 113.7 kg (250 lb 10.6 oz) IBW/kg (Calculated) : 84.5 Vital Signs: Temp: 97.8 F (36.6 C) (02/25 1312) Temp Source: Oral (02/25 1312) BP: 106/70 (02/25 1312) Pulse Rate: 79 (02/25 1312)  Labs: Recent Labs    08/31/21 0205 09/01/21 1414 09/02/21 0237 09/02/21 0424 09/02/21 0646 09/02/21 1238  HGB 17.8* 18.1* 17.6*  --   --   --   HCT 53.3* 54.5* 49.6  --   --   --   PLT 251 248 242  --   --   --   APTT  --  36  --   --   --   --   LABPROT 15.3* 18.4*  --   --   --   --   INR 1.2 1.5*  --   --   --   --   HEPARINUNFRC <0.10*  --   --  0.81*  --  0.73*  CREATININE 0.98 1.09  --   --  0.96  --     Estimated Creatinine Clearance: 136.4 mL/min (by C-G formula based on SCr of 0.96 mg/dL).  Assessment: 41 YO male who presented with stroke like symptoms. Plan was to originally have this patient bridge on warfarin and heparin. But after further discussion with neurology, team believe the best option for this patient would be apixaban.   Goal of Therapy:  Monitor platelets by anticoagulation protocol: Yes   Plan:  DC heparin and warfarin Start apixaban 10 mg BID x7 days followed by 5 mg BID there after Monitor CBC and s/sx of bleeding daily   Joseph Art, Pharm.D. PGY-1 Pharmacy Resident [phone] 09/02/2021 2:13 PM

## 2021-09-03 DIAGNOSIS — I5022 Chronic systolic (congestive) heart failure: Secondary | ICD-10-CM | POA: Diagnosis not present

## 2021-09-03 DIAGNOSIS — E7800 Pure hypercholesterolemia, unspecified: Secondary | ICD-10-CM | POA: Diagnosis not present

## 2021-09-03 DIAGNOSIS — N6320 Unspecified lump in the left breast, unspecified quadrant: Secondary | ICD-10-CM | POA: Diagnosis not present

## 2021-09-03 DIAGNOSIS — I6340 Cerebral infarction due to embolism of unspecified cerebral artery: Secondary | ICD-10-CM | POA: Diagnosis not present

## 2021-09-03 LAB — CBC
HCT: 48.2 % (ref 39.0–52.0)
Hemoglobin: 16.2 g/dL (ref 13.0–17.0)
MCH: 30.8 pg (ref 26.0–34.0)
MCHC: 33.6 g/dL (ref 30.0–36.0)
MCV: 91.6 fL (ref 80.0–100.0)
Platelets: 232 10*3/uL (ref 150–400)
RBC: 5.26 MIL/uL (ref 4.22–5.81)
RDW: 13.7 % (ref 11.5–15.5)
WBC: 10.6 10*3/uL — ABNORMAL HIGH (ref 4.0–10.5)
nRBC: 0 % (ref 0.0–0.2)

## 2021-09-03 MED ORDER — ATORVASTATIN CALCIUM 20 MG PO TABS: 20.0000 mg | ORAL_TABLET | Freq: Every day | ORAL | 0 refills | Status: DC

## 2021-09-03 MED ORDER — APIXABAN 5 MG PO TABS: ORAL_TABLET | ORAL | 0 refills | Status: DC

## 2021-09-03 MED ORDER — ATORVASTATIN CALCIUM 10 MG PO TABS
20.0000 mg | ORAL_TABLET | Freq: Every day | ORAL | Status: DC
  Administered 2021-09-03: 20 mg via ORAL
  Filled 2021-09-03: qty 2

## 2021-09-03 NOTE — Discharge Summary (Signed)
Physician Discharge Summary   Patient: Michael Stuart MRN: [id number] DOB: 1980-10-31  Admit date:     09/01/2021  Discharge date: 09/03/21  Discharge Physician: Michael Stuart Michael Stuart   PCP: Pcp, No   Recommendations at discharge:    Patient has been changed apixaban Low dose atorvastatin for CVA   Discontinue warfarin and enoxaparin injections.    Discharge Diagnoses: Principal Problem:   CVA (cerebral vascular accident) ([place]) Active Problems:   Chronic systolic CHF (congestive heart failure) ([place])   Pure hypercholesterolemia   Tobacco user   Left breast mass  Resolved Problems:   * No resolved hospital problems. [facility] Course: Michael Stuart was admitted to the hospital with the working diagnosis of acute CVA.   41 yo male with the past medical history of systolic heart failure with LV EF 20% with apical left ventricle thrombus, recently hospitalized 02/19 to 08/31/21 for heart failure exacerbation with newly diagnosed left ventricle apical thrombus. He was discharged on enoxaparin bridging and warfarin with a discharge INR of 1.2. He also has diagnosis of left breast mass with axillary and mediastinal lymphadenopathy.  At home patient had acute onset of left sided facial numbness and left facial droop with left vision changes. Brief duration of symptoms. On his initial physical examination his blood pressure was 110/68, HR 97, RR 16 and 02 saturation 98%, his lungs were clear to auscultation, heart with S1 and S2 present and rhythmic, abdomen soft and no lower extremity edema. Patient was neurologically not focal.   Na 135, K 4,8, CL 102, bicarbonate 25, glucose 91 bun 10 and cr 1,09 Wbc 11.2, hgb 18.1 hct 54.5, plt 248  INR 1,5  SARS covid 19 negative   Head CT with no acute changes.   EKG 93 bpm, normal axis, normal intervals, sinus rhythm with left atrial enlargement, no significant ST segment or T wave changes.   Brain MRI with patchy acute and subacute infarcts.   Cortical and subcortical left grater than right occipital lobes and left posterior temporal lobe with involvement of the superomedial right thalamus, posterior left periventricular white matter and right frontal lobe reduced diffusion.   Patient was placed on IV heparin and neurology was consulted.  Decision was made to transition patient to apixaban for anticoagulation. At discharge his neurologic deficit resolved. Plan to follow up as outpatient.   Assessment and Plan: * CVA (cerebral vascular accident) ([place])- (present on admission) Patient with improvement in his neurologic deficit. Currently on heparin drip for anticoagulation.  INR is 1,5   Plan to continue blood pressure control and statin therapy. Discussed with cardiology, Michael Stuart, patient stable to transition to oral anticoagulation, per guideline is preferred warfarin but can be changed to [place].  Note that at the time of CVA INR was not therapeutic.  I will be more in Stuart to continue with warfarin per guideline recommendations.  Case discussed with Michael Stuart and will plan to continue anticoagulation with warfarin. Will keep patient hospitalized on heparin drip until INR 2 for 48 hrs.      Chronic systolic CHF (congestive heart failure) ([place])- (present on admission) No clinical signs of heart failure decompensation.  Plan to continue therapy with digoxin,empagliflozin, metoprolol, eplerenone, ivabradine.  No need for furosemide at this point.   Pure hypercholesterolemia- (present on admission) Will start patient on low dose statin therapy for now.   Tobacco user- (present on admission) Continue smoking cessation counseling.   Left breast mass- (present on admission) Patient  needs outpatient follow up with oncology            Consultants: neurology, cardiology (not formal consultation)  Procedures performed: none   Disposition: Home Diet recommendation:  Cardiac diet  DISCHARGE MEDICATION: Allergies as  of 09/03/2021       Reactions   Bactrim [sulfamethoxazole-trimethoprim] Other (See Comments)   Blood Capillaries Rupture         Medication List     STOP taking these medications    benzonatate 100 MG capsule Commonly known as: TESSALON   enoxaparin 120 MG/0.8ML injection Commonly known as: LOVENOX   warfarin 2.5 MG tablet Commonly known as: COUMADIN       TAKE these medications    acetaminophen 500 MG tablet Commonly known as: TYLENOL Take 1,000 mg by mouth every 6 (six) hours as needed for moderate pain.   albuterol 108 (90 Base) MCG/ACT inhaler Commonly known as: VENTOLIN HFA Inhale 1-2 puffs into the lungs every 6 (six) hours as needed for wheezing or shortness of breath.   antiseptic oral rinse Liqd 15 mLs by Mouth Rinse route as needed for dry mouth.   apixaban 5 MG Tabs tablet Commonly known as: ELIQUIS Take 2 tablets twice daily until March 08/2021, then on March 10/2021 start taking 1 tablet twice daily.   atorvastatin 20 MG tablet Commonly known as: LIPITOR Take 1 tablet (20 mg total) by mouth daily.   dapagliflozin propanediol 10 MG Tabs tablet Commonly known as: FARXIGA Take 1 tablet (10 mg total) by mouth daily.   digoxin 0.125 MG tablet Commonly known as: LANOXIN Take 1 tablet (0.125 mg total) by mouth daily.   eplerenone 25 MG tablet Commonly known as: INSPRA Take 2 tablets (50 mg total) by mouth daily.   ivabradine 5 MG Tabs tablet Commonly known as: CORLANOR Take 1 tablet (5 mg total) by mouth 2 (two) times daily with a meal.   metoprolol succinate 25 MG 24 hr tablet Commonly known as: TOPROL-XL Take 1 tablet (25 mg total) by mouth daily.   PROMETHAZINE HCL PO Take 1 capsule by mouth as needed (sleep).   sacubitril-valsartan 24-26 MG Commonly known as: ENTRESTO Take 1 tablet by mouth 2 (two) times daily.         Discharge Exam: Filed Weights   09/02/21 0623  Weight: 113.7 kg   BP 105/62    Pulse 80    Temp 98.7 F  (37.1 C)    Resp 20    Ht 6\' 3"  (1.905 m)    Wt 113.7 kg    SpO2 100%    BMI 31.33 kg/m   Patient is feeling well with no chest pain or dyspnea.  Neurology with no focal deficit ENT with no pallor Cardiovascular with S1 and S2 present and rhythmic with no gallops or rubs No JVD No lower extremity edema Respiratory with no rales or wheezing Abdomen soft and non tender   Condition at discharge: stable  The results of significant diagnostics from this hospitalization (including imaging, microbiology, ancillary and laboratory) are listed below for reference.   Imaging Studies: DG Chest 2 View  Result Date: 08/27/2021 CLINICAL DATA:  SOB EXAM: CHEST - 2 VIEW COMPARISON:  None. FINDINGS: Mild interstitial prominence at the lateral lung bases. No confluent consolidation. No visible pleural effusions or pneumothorax. Enlarged cardiac silhouette. Multiple suspected remote left rib fractures. IMPRESSION: 1. Mild interstitial prominence at the lateral lung bases, which may represent mild interstitial edema. 2. Cardiomegaly. Electronically Signed   By:  Margaretha Sheffield M.D.   On: 08/27/2021 11:36   CT HEAD WO CONTRAST (5MM)  Result Date: 09/01/2021 CLINICAL DATA:  Neuro deficit, acute, stroke suspected; right facial numbness and blurry vision EXAM: CT HEAD WITHOUT CONTRAST TECHNIQUE: Contiguous axial images were obtained from the base of the skull through the vertex without intravenous contrast. RADIATION DOSE REDUCTION: This exam was performed according to the departmental dose-optimization program which includes automated exposure control, adjustment of the mA and/or kV according to patient size and/or use of iterative reconstruction technique. COMPARISON:  None. FINDINGS: Brain: There is no acute intracranial hemorrhage or mass effect. Focal low-density in the left frontal subcortical white matter (series 3, image 21). Gray-white differentiation is preserved. There is no extra-axial fluid  collection. Ventricles and sulci are within normal limits in size and configuration. Vascular: No hyperdense vessel or unexpected calcification. Skull: Calvarium is unremarkable. Sinuses/Orbits: Paranasal sinus mucosal thickening. Air-fluid level and aerosolized secretions in the left maxillary sinus. Unremarkable orbits. Other: None. IMPRESSION: No acute intracranial hemorrhage or evidence of acute infarction. Area of low density in the left frontal white matter. Contrast enhanced MRI is recommended exclude an underlying lesion. Left maxillary sinus inflammatory changes. Acute sinusitis is possible in the appropriate clinical setting. Electronically Signed   By: Macy Mis M.D.   On: 09/01/2021 14:36   CT Angio Chest PE W and/or Wo Contrast  Result Date: 08/27/2021 CLINICAL DATA:  Positive D-dimer, cough EXAM: CT ANGIOGRAPHY CHEST WITH CONTRAST TECHNIQUE: Multidetector CT imaging of the chest was performed using the standard protocol during bolus administration of intravenous contrast. Multiplanar CT image reconstructions and MIPs were obtained to evaluate the vascular anatomy. RADIATION DOSE REDUCTION: This exam was performed according to the departmental dose-optimization program which includes automated exposure control, adjustment of the mA and/or kV according to patient size and/or use of iterative reconstruction technique. CONTRAST:  60mL OMNIPAQUE IOHEXOL 350 MG/ML SOLN COMPARISON:  None. FINDINGS: Cardiovascular: No pulmonary embolism identified. Main pulmonary artery is dilated measuring 3.3 cm in diameter. Heart is enlarged. Small pericardial effusion. Thoracic aorta is normal in course and caliber. Mediastinum/Nodes: Bulky lymphadenopathy in the left axilla with the largest nodes measuring 1.9 x 2 cm and 2.7 x 1.8 cm. Also multiple enlarged mediastinal and bilateral hilar lymph nodes measuring up to 15 mm in short axis in the mediastinum and hila. There is a solid retroareolar left breast mass  measuring 3.8 x 3.8 x 3.6 cm with overlying lobulated/nodular skin thickening. Lungs/Pleura: Breathing motion and subsegmental atelectatic changes in the lungs. Small left and trace right pleural effusions. No focal consolidation or pulmonary nodule identified. No pneumothorax. Upper Abdomen: No acute abnormality. Musculoskeletal: A few old left rib fractures noted. No suspicious bony lesions identified. Review of the MIP images confirms the above findings. IMPRESSION: 1. 3.8 cm left breast mass as described, highly concerning for malignancy. Accompanying bulky left axillary lymphadenopathy as well as mediastinal and hilar adenopathy. Mammography and tissue sampling recommended. 2. No pulmonary embolism identified. 3. Cardiomegaly and small pericardial effusion. 4. Small left and trace right pleural effusions with associated atelectatic changes. 5. Mildly dilated pulmonary artery which may represent pulmonary artery hypertension. Electronically Signed   By: Ofilia Neas M.D.   On: 08/27/2021 13:41   MR Brain W and Wo Contrast  Result Date: 09/01/2021 CLINICAL DATA:  Abnormal head CT EXAM: MRI HEAD WITHOUT AND WITH CONTRAST TECHNIQUE: Multiplanar, multiecho pulse sequences of the brain and surrounding structures were obtained without and with intravenous contrast. CONTRAST:  10mL GADAVIST GADOBUTROL 1 MMOL/ML IV SOLN COMPARISON:  CT head earlier same day FINDINGS: Brain: There is cortical/subcortical mildly reduced diffusion in the left frontal lobe corresponding to abnormality on CT. There is some associated susceptibility likely reflecting petechial hemorrhage. Minimal curvilinear enhancement is present. Patchy cortical/subcortical reduced diffusion is also present in the left greater than right occipital lobes and left posterior temporal lobe with additional involvement of the superomedial right thalamus, posterior left periventricular white matter, and right frontal lobe along the medial precentral  gyrus. There is associated enhancement in some of these areas. No mass effect. Ventricles and sulci are normal in size and configuration. A few additional small foci of T2 hyperintensity in the supratentorial white matter are nonspecific but may reflect minor chronic microvascular ischemic changes. Vascular: Major vessel flow voids at the skull base are preserved. Skull and upper cervical spine: Normal marrow signal is preserved. Sinuses/Orbits: Paranasal sinus inflammatory changes as noted on the prior CT. Orbits are unremarkable. Other: Sella is unremarkable.  Mastoid air cells are clear. IMPRESSION: Patchy acute and subacute infarcts as described. Largest area of involvement is in the left frontal lobe corresponding to abnormality on CT where there is some petechial hemorrhage. Involvement of multiple vascular territories suggesting a central source. No intracranial mass. Electronically Signed   By: Macy Mis M.D.   On: 09/01/2021 18:05   CARDIAC CATHETERIZATION  Result Date: 08/28/2021 Images from the original result were not included. Normal coronary arteries without coronary artery disease RA: 10 mmHg RV: 49/4 mmHg PA: 44/24 mmHg, mPAP 34 mmHg PCW: 22 mmHg CO: 5.7 L/min CI: 2.3 L/min/m2 Decompensated nonischemic cardiomyopathy Resume IV heparin 2 hours after TR band is off given LV apical thrombus GDMT for heart failure and continued workup for breast mass Nigel Mormon, MD Pager: [phone] Office: [phone]  MR CARDIAC MORPHOLOGY W WO CONTRAST  Result Date: 08/29/2021 CLINICAL DATA:  Cardiomyopathy of uncertain etiology EXAM: CARDIAC MRI TECHNIQUE: The patient was scanned on a 1.5 Tesla GE magnet. A dedicated cardiac coil was used. Functional imaging was done using Fiesta sequences. 2,3, and 4 chamber views were done to assess for RWMA's. Modified Simpson's rule using a short axis stack was used to calculate an ejection fraction on a dedicated work Conservation officer, nature. The  patient received 8 cc of Gadavist. After 10 minutes inversion recovery sequences were used to assess for infiltration and scar tissue. FINDINGS: Limited images of the lung fields showed no gross abnormalities. There is a 3.5 x 3 cm left breast mass noted. Small circumferential pericardial effusion. Severely dilated left ventricle with mild concentric LV hypertrophy. Severe global hypokinesis, EF 10%. There was a 2.1 x 1.4 somewhat amorphous LV apical thrombus. Mildly dilated right ventricle with severe systolic dysfunction, EF 15%. Mildly dilated left atrium, normal right atrium. Trileaflet aortic valve with no significant stenosis or regurgitation. No more than mild mitral regurgitation noted. Delayed enhancement imaging: Dense, transmural late gadolinium enhancement (LGE) in the mid inferolateral wall with what appears to be an area of no reflow subendocardially. Subepicardial apical inferior LGE Subepicardial basal to mid inferoseptal LGE at the inferior RV insertion site. MEASUREMENTS: MEASUREMENTS LVEDV 345 mL LVSV 33 mL LVEF 10% RVEDV 233 mL RVSV 35 mL RVEF 15% T2 59 apical inferior wall, all other regions < 54 T1 1068/ECV 31% septum T1 1090/ECV 38% lateral wall IMPRESSION: 1.  Left breast mass as seen on prior CT.  Ongoing workup. 2. Severely dilated LV with mild concentric LV hypertrophy. EF  10% with global hypokinesis. 3. 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 The patient is on heparin gtt. 4.  Mildly dilated RV with EF 15%. 5. Delayed enhancement pattern as above. The inferolateral LGE could be a OM territory infarct, but corresponding lesion not seen by cath. Overall, most likely acute myocarditis given subepicardial apical inferior LGE also (the RV insertion site LGE can be nonspecific in the setting of volume/pressure overload). Elevated T2 in the apical inferior wall is more suggestive of acute myocarditis. 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 Would consider CHF service/clinic followup given profound  biventricular cardiomyopathy in young patient. Dalton Mclean Electronically Signed   By: Loralie Champagne M.D.   On: 08/29/2021 12:46   ECHOCARDIOGRAM COMPLETE  Result Date: 08/27/2021    ECHOCARDIOGRAM REPORT   Patient Name:   KAYCEN WHITWORTH Lindstrom Date of Exam: 08/27/2021 Medical Rec #:  [id number]    Height:       75.0 in Accession #:    [id number]   Weight:       260.0 lb Date of Birth:  18-Sep-1980     BSA:          2.453 m Patient Age:    13 years     BP:           129/114 mmHg Patient Gender: M            HR:           108 bpm. Exam Location:  Inpatient Procedure: 2D Echo, Color Doppler, Cardiac Doppler and Intracardiac            Opacification Agent Indications:    Elevated troponin.                 NSTEMI.                 Left Breast Mass  History:        Patient has no prior history of Echocardiogram examinations.                 Signs/Symptoms:Shortness of Breath.  Sonographer:    Clayton Lefort RDCS (AE) Referring Phys: [id number] [place]  1. Left ventricular ejection fraction, by estimation, is 20 to 25%. The left ventricle has severely decreased function. The left ventricle demonstrates global hypokinesis. The left ventricular internal cavity size was dilated. Left ventricular diastolic  parameters are consistent with Grade III diastolic dysfunction (restrictive). Elevated left ventricular end-diastolic pressure.  2. Right ventricular systolic function is hyperdynamic. The right ventricular size is mildly enlarged. dilated.  3. Left atrial size was mild to moderately dilated.  4. Right atrial size was dilated.  5. A small pericardial effusion is present. The pericardial effusion is posterior to the left ventricle.  6. The mitral valve is grossly normal. Mild mitral valve regurgitation. No evidence of mitral stenosis.  7. The aortic valve is tricuspid. Aortic valve regurgitation is not visualized. No aortic stenosis is present.  8. There is mild dilatation of the ascending aorta, measuring 40 mm.  9.  The inferior vena cava is dilated in size with >50% respiratory variability, suggesting right atrial pressure of 8 mmHg. Comparison(s): No prior Echocardiogram. Conclusion(s)/Recommendation(s): Findings concerning for either LV thrombus or mass (given his history) measures 2.18 x 1.15cm (image 82), would recommend Cardiac MRI for clarification. FINDINGS  Left Ventricle: Left ventricular ejection fraction, by estimation, is 20 to 25%. The left ventricle has severely decreased function. The left ventricle demonstrates global hypokinesis. Definity contrast agent was given  IV to delineate the left ventricular endocardial borders. The left ventricular internal cavity size was dilated. There is no left ventricular hypertrophy. Left ventricular diastolic parameters are consistent with Grade III diastolic dysfunction (restrictive). Elevated left ventricular end-diastolic pressure. Right Ventricle: The right ventricular size is mildly enlarged. Dilated. Right ventricular systolic function is hyperdynamic. Left Atrium: Left atrial size was mild to moderately dilated. Right Atrium: Right atrial size was dilated. Pericardium: A small pericardial effusion is present. The pericardial effusion is posterior to the left ventricle. Mitral Valve: The mitral valve is grossly normal. Mild mitral valve regurgitation. No evidence of mitral valve stenosis. MV peak gradient, 3.3 mmHg. The mean mitral valve gradient is 1.0 mmHg. Tricuspid Valve: The tricuspid valve is normal in structure. Tricuspid valve regurgitation is trivial. No evidence of tricuspid stenosis. Aortic Valve: The aortic valve is tricuspid. Aortic valve regurgitation is not visualized. No aortic stenosis is present. Aortic valve mean gradient measures 1.0 mmHg. Aortic valve peak gradient measures 2.2 mmHg. Aortic valve area, by VTI measures 3.48 cm. Pulmonic Valve: The pulmonic valve was grossly normal. Pulmonic valve regurgitation is not visualized. No evidence of  pulmonic stenosis. Aorta: The aortic root is normal in size and structure. There is mild dilatation of the ascending aorta, measuring 40 mm. Venous: The inferior vena cava is dilated in size with greater than 50% respiratory variability, suggesting right atrial pressure of 8 mmHg. IAS/Shunts: The atrial septum is grossly normal.  LEFT VENTRICLE PLAX 2D LVIDd:         6.60 cm      Diastology LVIDs:         5.70 cm      LV e' medial:    3.00 cm/s LV PW:         1.18 cm      LV E/e' medial:  20.3 LV IVS:        1.10 cm      LV e' lateral:   6.00 cm/s LVOT diam:     2.50 cm      LV E/e' lateral: 10.2 LV SV:         37 LV SV Index:   15 LVOT Area:     4.91 cm  LV Volumes (MOD) LV vol d, MOD A2C: 262.0 ml LV vol d, MOD A4C: 206.0 ml LV vol s, MOD A2C: 224.0 ml LV vol s, MOD A4C: 146.0 ml LV SV MOD A2C:     38.0 ml LV SV MOD A4C:     206.0 ml LV SV MOD BP:      37.2 ml RIGHT VENTRICLE             IVC RV Basal diam:  3.80 cm     IVC diam: 2.60 cm RV Mid diam:    3.20 cm RV S prime:     10.80 cm/s TAPSE (M-mode): 1.1 cm LEFT ATRIUM              Index        RIGHT ATRIUM           Index LA diam:        4.80 cm  1.96 cm/m   RA Area:     27.00 cm LA Vol (A2C):   111.0 ml 45.25 ml/m  RA Volume:   93.90 ml  38.28 ml/m LA Vol (A4C):   83.2 ml  33.91 ml/m LA Biplane Vol: 99.7 ml  40.64 ml/m  AORTIC VALVE AV Area (Vmax):  4.11 cm AV Area (Vmean):   3.48 cm AV Area (VTI):     3.48 cm AV Vmax:           73.80 cm/s AV Vmean:          51.300 cm/s AV VTI:            0.107 m AV Peak Grad:      2.2 mmHg AV Mean Grad:      1.0 mmHg LVOT Vmax:         61.80 cm/s LVOT Vmean:        36.400 cm/s LVOT VTI:          0.076 m LVOT/AV VTI ratio: 0.71  AORTA Ao Root diam: 3.60 cm Ao Asc diam:  4.00 cm MITRAL VALVE MV Area VTI:  2.15 cm     SHUNTS MV Peak grad: 3.3 mmHg     Systemic VTI:  0.08 m MV Mean grad: 1.0 mmHg     Systemic Diam: 2.50 cm MV Vmax:      0.91 m/s MV Vmean:     53.7 cm/s MV E velocity: 61.00 cm/s MV A velocity: 20.00  cm/s MV E/A ratio:  3.05 Sunit Tolia DO Electronically signed by Rex Kras DO Signature Date/Time: 08/27/2021/3:34:05 PM    Final    VAS US CAROTID (at [facility] and [facility] only)  Result Date: 09/02/2021 Carotid Arterial Duplex Study Patient Name:  CROSBY ORIORDAN Raven  Date of Exam:   09/02/2021 Medical Rec #: [id number]     Accession #:    [id number] Date of Birth: Aug 22, 1980      Patient Gender: M Patient Age:   49 years Exam Location:  [facility] Procedure:      VAS US CAROTID Referring Phys: Harrold Donath --------------------------------------------------------------------------------  Indications:       CVA. Risk Factors:      Hyperlipidemia, current smoker. Other Factors:     CHF. Comparison Study:  No previous exams Performing Technologist: Jody Hill RVT, RDMS  Examination Guidelines: A complete evaluation includes B-mode imaging, spectral Doppler, color Doppler, and power Doppler as needed of all accessible portions of each vessel. Bilateral testing is considered an integral part of a complete examination. Limited examinations for reoccurring indications may be performed as noted.  Right Carotid Findings: +----------+--------+--------+--------+------------------+--------+             PSV cm/s EDV cm/s Stenosis Plaque Description Comments  +----------+--------+--------+--------+------------------+--------+  CCA Prox   91       17                                             +----------+--------+--------+--------+------------------+--------+  CCA Distal 75       21                                             +----------+--------+--------+--------+------------------+--------+  ICA Prox   47       19                                             +----------+--------+--------+--------+------------------+--------+  ICA Distal 50  18                                             +----------+--------+--------+--------+------------------+--------+  ECA        81       16                                              +----------+--------+--------+--------+------------------+--------+ +----------+--------+-------+----------------+-------------------+             PSV cm/s EDV cms Describe         Arm Pressure (mmHG)  +----------+--------+-------+----------------+-------------------+  Subclavian 98               Multiphasic, WNL                      +----------+--------+-------+----------------+-------------------+ +---------+--------+--+--------+-+---------+  Vertebral PSV cm/s 29 EDV cm/s 9 Antegrade  +---------+--------+--+--------+-+---------+  Left Carotid Findings: +----------+--------+--------+--------+------------------+--------+             PSV cm/s EDV cm/s Stenosis Plaque Description Comments  +----------+--------+--------+--------+------------------+--------+  CCA Prox   97       19                                             +----------+--------+--------+--------+------------------+--------+  CCA Distal 85       24                                             +----------+--------+--------+--------+------------------+--------+  ICA Prox   45       21                                             +----------+--------+--------+--------+------------------+--------+  ICA Distal 56       25                                             +----------+--------+--------+--------+------------------+--------+  ECA        47       6                                              +----------+--------+--------+--------+------------------+--------+ +----------+--------+--------+----------------+-------------------+             PSV cm/s EDV cm/s Describe         Arm Pressure (mmHG)  +----------+--------+--------+----------------+-------------------+  Subclavian 77                Multiphasic, WNL                      +----------+--------+--------+----------------+-------------------+ +---------+--------+--+--------+--+---------+  Vertebral PSV cm/s 36 EDV cm/s 13 Antegrade  +---------+--------+--+--------+--+---------+   Summary: Right  Carotid:  The extracranial vessels were near-normal with only minimal wall                thickening or plaque. Left Carotid: The extracranial vessels were near-normal with only minimal wall               thickening or plaque. Vertebrals:  Bilateral vertebral arteries demonstrate antegrade flow. Subclavians: Normal flow hemodynamics were seen in bilateral subclavian              arteries. *See table(s) above for measurements and observations.     Preliminary    US Abdomen Limited RUQ (LIVER/GB)  Result Date: 08/27/2021 CLINICAL DATA:  Elevated LFTs EXAM: ULTRASOUND ABDOMEN LIMITED RIGHT UPPER QUADRANT COMPARISON:  None. FINDINGS: Gallbladder: No gallstones or wall thickening visualized. No sonographic Murphy sign noted by sonographer. Common bile duct: Diameter: 2.5 mm Liver: No focal lesion identified. Within normal limits in parenchymal echogenicity. Portal vein is patent on color Doppler imaging with normal direction of blood flow towards the liver. Other: None. IMPRESSION: Normal right upper quadrant ultrasound. Electronically Signed   By: Maurine Simmering M.D.   On: 08/27/2021 15:25    Microbiology: Results for orders placed or performed during the hospital encounter of 09/01/21  Resp Panel by RT-PCR (Flu A&B, Covid) Nasopharyngeal Swab     Status: None   Collection Time: 09/01/21  9:17 PM   Specimen: Nasopharyngeal Swab; Nasopharyngeal(NP) swabs in vial transport medium  Result Value Ref Range Status   SARS Coronavirus 2 by RT PCR NEGATIVE NEGATIVE Final    Comment: (NOTE) SARS-CoV-2 target nucleic acids are NOT DETECTED.  The SARS-CoV-2 RNA is generally detectable in upper respiratory specimens during the acute phase of infection. The lowest concentration of SARS-CoV-2 viral copies this assay can detect is 138 copies/mL. A negative result does not preclude SARS-Cov-2 infection and should not be used as the sole basis for treatment or other patient management decisions. A negative result may occur with   improper specimen collection/handling, submission of specimen other than nasopharyngeal swab, presence of viral mutation(s) within the areas targeted by this assay, and inadequate number of viral copies(<138 copies/mL). A negative result must be combined with clinical observations, patient history, and epidemiological information. The expected result is Negative.  Fact Sheet for Patients:  EntrepreneurPulse.com.au  Fact Sheet for Healthcare Providers:  IncredibleEmployment.be  This test is no t yet approved or cleared by the [place] FDA and  has been authorized for detection and/or diagnosis of SARS-CoV-2 by FDA under an Emergency Use Authorization (EUA). This EUA will remain  in effect (meaning this test can be used) for the duration of the COVID-19 declaration under Section 564(b)(1) of the Act, 21 U.S.C.section 360bbb-3(b)(1), unless the authorization is terminated  or revoked sooner.       Influenza A by PCR NEGATIVE NEGATIVE Final   Influenza B by PCR NEGATIVE NEGATIVE Final    Comment: (NOTE) The Xpert Xpress SARS-CoV-2/FLU/RSV plus assay is intended as an aid in the diagnosis of influenza from Nasopharyngeal swab specimens and should not be used as a sole basis for treatment. Nasal washings and aspirates are unacceptable for Xpert Xpress SARS-CoV-2/FLU/RSV testing.  Fact Sheet for Patients: EntrepreneurPulse.com.au  Fact Sheet for Healthcare Providers: IncredibleEmployment.be  This test is not yet approved or cleared by the [place] FDA and has been authorized for detection and/or diagnosis of SARS-CoV-2 by FDA under an Emergency Use Authorization (EUA). This EUA will remain in effect (meaning this test can be  used) for the duration of the COVID-19 declaration under Section 564(b)(1) of the Act, 21 U.S.C. section 360bbb-3(b)(1), unless the authorization is terminated  or revoked.  Performed at [facility], [address], [place], [place] [address]     Labs: CBC: Recent Labs  Lab 08/27/21 1103 08/28/21 0229 08/30/21 0159 08/31/21 0205 09/01/21 1414 09/02/21 0237 09/03/21 0243  WBC 14.9*   < > 8.8 12.6* 11.2* 7.7 10.6*  NEUTROABS 11.6*  --   --   --  6.1  --   --   HGB 15.5   < > 17.4* 17.8* 18.1* 17.6* 16.2  HCT 49.0   < > 53.2* 53.3* 54.5* 49.6 48.2  MCV 97.2   < > 91.7 91.4 93.8 88.9 91.6  PLT 367   < > 311 251 248 242 232   < > = values in this interval not displayed.   Basic Metabolic Panel: Recent Labs  Lab 08/27/21 1103 08/27/21 1514 08/28/21 0229 08/28/21 1415 08/30/21 0159 08/31/21 0205 09/01/21 1414 09/02/21 0646  NA 137  --    < > 138 134* 136 135 137  K 4.7  --    < > 4.5 3.9 4.1 4.8 4.9  CL 105  --    < > 101 101 105 102 106  CO2 22  --    < > 27 22 21* 25 22  GLUCOSE 101*  --    < > 101* 108* 109* 91 134*  BUN 10  --    < > 14 10 12 10 12   CREATININE 1.18  --    < > 1.20 0.96 0.98 1.09 0.96  CALCIUM 8.9  --    < > 9.2 8.7* 8.7* 9.0 9.2  MG 1.8 1.9  --   --   --   --   --   --    < > = values in this interval not displayed.   Liver Function Tests: Recent Labs  Lab 08/27/21 1103 08/28/21 0229 08/28/21 1415 09/01/21 1414  AST 44* 34 35 39  ALT 61* 50* 50* 46*  ALKPHOS 47 46 46 54  BILITOT 0.6 0.7 0.8 0.7  PROT 6.3* 6.2* 6.1* 6.7  ALBUMIN 3.4* 3.0* 3.2* 3.4*   CBG: No results for input(s): GLUCAP in the last 168 hours.  Discharge time spent: greater than 30 minutes.  Signed: Tawni Millers, MD [facility] 09/03/2021

## 2021-09-04 ENCOUNTER — Other Ambulatory Visit (HOSPITAL_COMMUNITY): Payer: Self-pay

## 2021-09-04 ENCOUNTER — Other Ambulatory Visit: Payer: Self-pay | Admitting: Hematology and Oncology

## 2021-09-04 ENCOUNTER — Ambulatory Visit: Payer: BC Managed Care – PPO | Admitting: Cardiology

## 2021-09-04 ENCOUNTER — Other Ambulatory Visit: Payer: Self-pay

## 2021-09-04 VITALS — BP 109/86 | HR 93 | Temp 97.7°F | Ht 75.0 in | Wt 254.0 lb

## 2021-09-04 DIAGNOSIS — N6320 Unspecified lump in the left breast, unspecified quadrant: Secondary | ICD-10-CM | POA: Diagnosis not present

## 2021-09-04 DIAGNOSIS — I400 Infective myocarditis: Secondary | ICD-10-CM | POA: Diagnosis not present

## 2021-09-04 DIAGNOSIS — I5020 Unspecified systolic (congestive) heart failure: Secondary | ICD-10-CM

## 2021-09-04 DIAGNOSIS — Z8673 Personal history of transient ischemic attack (TIA), and cerebral infarction without residual deficits: Secondary | ICD-10-CM

## 2021-09-04 NOTE — Progress Notes (Addendum)
Follow up visit  Subjective:   Michael Stuart, male    DOB: 23-Feb-1981, 41 y.o.   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  He had respiratory viral illness around December 2022, and has had progressive dyspnea since then.  He underwent coronary angiography showed no significant coronary artery disease.  Cardiac MRI showed possible myocarditis.  Patient responded to IV diuresis and initiation of heart failure management with significant improvement in his dyspnea symptoms.  He was also found to have apical thrombus secondary to systolic heart failure.  He was started on heparin/warfarin bridge.  Given slow increase in his INR levels, he was discharged on Lovenox/warfarin bridge.  He was able to ambulate on the day of discharge without any significant symptoms. He was also seen by heart failure specialist Dr. Aundra Dubin.  LifeVest was arranged given his risk of ventricular arrhythmia.  Oncology was contacted, and is scheduled to see Dr. Lindi Adie on 09/11/2021.    Patient was re-admitted to the hospital the next day after acute onset vision loss in right eye and facial numbness. CT/MRI showed multifocal small strokes with petechial hemorrhage, detailed below. He was seen by neurology and recommended DOAC given stroke in spite of being on warfarin/lovenox. He was recommended DVT doe eliquis 10 mg bid till 09/07/2021, followed by 5 mg bid, by Dr. Einar Gip. He was discharged a day later.  Patient is here today with his mother. Rt eye partial vision loss is  improving, facial numbness is resolved. His breathing is much improved. He was able to walk a mile up the hill without any significant difficulty. He is compliant with his medical therapy. He has f/u scheduled with oncology and heart failure clinic in next 2 weeks.     Current Outpatient Medications:    acetaminophen (TYLENOL) 500 MG tablet, Take 1,000 mg by mouth every 6 (six) hours as needed for moderate pain., Disp: , Rfl:    albuterol (VENTOLIN HFA) 108 (90 Base) MCG/ACT inhaler, Inhale 1-2 puffs into the lungs every 6 (six) hours as needed for wheezing or shortness of breath., Disp: 1 each, Rfl: 0   antiseptic oral rinse (BIOTENE) LIQD, 15 mLs by Mouth Rinse route as needed for dry mouth., Disp: , Rfl:    apixaban (ELIQUIS) 5 MG TABS tablet, Take 2 tablets twice daily until March 08/2021, then on March 10/2021 start taking 1 tablet twice daily., Disp: 60 tablet, Rfl: 0   atorvastatin (LIPITOR) 20 MG tablet, Take 1 tablet (20 mg total) by mouth daily., Disp: 30 tablet, Rfl: 0   dapagliflozin propanediol (FARXIGA) 10 MG TABS tablet, Take 1 tablet (10 mg total) by mouth daily., Disp: 30 tablet, Rfl: 2   digoxin (LANOXIN) 0.125 MG tablet, Take 1 tablet (0.125 mg total) by mouth daily., Disp: 30 tablet, Rfl: 2   eplerenone (INSPRA) 25 MG tablet, Take 2 tablets (50 mg total) by mouth daily., Disp: 60 tablet, Rfl: 2   ivabradine (CORLANOR) 5 MG TABS tablet, Take 1 tablet (5 mg total) by mouth 2 (two) times daily with  a meal., Disp: 60 tablet, Rfl: 2   metoprolol succinate (TOPROL-XL) 25 MG 24 hr tablet, Take 1 tablet (25 mg total) by mouth daily., Disp: 30 tablet, Rfl: 2   PROMETHAZINE HCL PO, Take 1 capsule by mouth as needed (sleep)., Disp: , Rfl:    sacubitril-valsartan (ENTRESTO) 24-26 MG, Take 1 tablet by mouth 2 (two) times daily., Disp: 60 tablet, Rfl: 2   Cardiovascular & other pertient studies:  Reviewed external labs and tests, independently interpreted  EKG 09/04/2021: Sinus rhythm 90  bpm  Biatrial enlargement Diffuse nonspecific T-abnormality  MRI brain 09/01/2021: Patchy acute and subacute infarcts as described. Largest area of involvement is in the left frontal lobe corresponding to abnormality on CT where there is some petechial hemorrhage. Involvement of multiple vascular territories suggesting a central source. No intracranial mass.  LHC/RHC 08/28/2021: Normal coronary arteries without coronary artery disease   RA: 10 mmHg RV: 49/4 mmHg PA: 44/24 mmHg, mPAP 34 mmHg PCW: 22 mmHg   CO: 5.7 L/min CI: 2.3 L/min/m2   Decompensated nonischemic cardiomyopathy Resume IV heparin 2 hours after TR band is off given LV apical thrombus GDMT for heart failure and continued workup for breast mass  Echocardiogram 08/27/2021: 1. Left ventricular ejection fraction, by estimation, is 20 to 25%. The  left ventricle has severely decreased function. The left ventricle  demonstrates global hypokinesis. The left ventricular internal cavity size  was dilated. Left ventricular diastolic   parameters are consistent with Grade III diastolic dysfunction  (restrictive). Elevated left ventricular end-diastolic pressure.   2. Right ventricular systolic function is hyperdynamic. The right  ventricular size is mildly enlarged. dilated.   3. Left atrial size was mild to moderately dilated.   4. Right atrial size was dilated.   5. A small pericardial effusion is present. The pericardial effusion is  posterior to the left ventricle.   6. The mitral valve is grossly normal. Mild mitral valve regurgitation.  No evidence of mitral stenosis.   7. The aortic valve is tricuspid. Aortic valve regurgitation is not  visualized. No aortic stenosis is present.   8. There is mild dilatation of the ascending aorta, measuring 40 mm.   9. The inferior vena cava is dilated in size with >50% respiratory  variability, suggesting right atrial pressure of 8 mmHg.   Recent labs: 09/02/2021: Glucose 134,  BUN/Cr 12/0.96. EGFR >60. Na/K 137/4.9. Albumin 3.4. ALT 46. Rest of the CMP normal H/H 16/48. MCV 91 . Platelets 232 HbA1C 5.6% Chol 141, TG 109, HDL 40, LDL 79 TSH 0.3 normal    Review of Systems  Eyes:  Positive for vision loss in right eye (Partial).  Cardiovascular:  Negative for chest pain, dyspnea on exertion, leg swelling, palpitations and syncope.        Vitals:   09/04/21 1506  BP: 109/86  Pulse: 93  Temp: 97.7 F (36.5 C)  SpO2: 97%    Body mass index is 31.75 kg/m. Filed Weights   09/04/21 1506  Weight: 254 lb (115.2 kg)     Objective:   Physical Exam Vitals and nursing note reviewed.  Constitutional:      General: He is not in acute distress. Neck:     Vascular: No JVD.  Cardiovascular:     Rate and Rhythm: Normal rate and regular rhythm.     Heart sounds: Normal heart sounds. No murmur heard. Pulmonary:     Effort: Pulmonary effort is normal.     Breath sounds: Normal breath sounds. No  wheezing or rales.  Musculoskeletal:     Right lower leg: No edema.     Left lower leg: No edema.            Visit diagnoses:   ICD-10-CM   1. HFrEF (heart failure with reduced ejection fraction) (HCC)  I50.20 EKG 12-Lead    2. Subacute viral myocarditis  I40.0     3. Mass of left breast, unspecified quadrant  N63.20     4. 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 H/o viral illness in December 2022. Currently, well compensated, BYHA  improved from IV to I-II. Continue Entresto 24-26 mg bid, spironolactone 12.5 mg daily, Farxiga 10 mg daily, Corlanor 5 mg bid, digoxin 0.125 mg daily, metoprolol succinate 12.5 mg daily. No change made to his HF regimen today. He has app with HF clinic on 3/10, when dig level can be checked. Based on recommendations at that visit, further prescriptions for heart failure meds can be given.  Continue Lifevest use. Will likely repeat cardiac MRI in 3 months  Cardioembolic stroke: Multifocal strokes secondary to LV apical thrombus, with mild petechial hemorrhage. While the following recommendation is not guideline based, I think this is reasonable approach given stroke while on lovenox/warfarin. His risk of cardioembolic source remains high without adequate anticoagulation at least for 3 months. This was discussed at length with the patient and his mother.  Continue eliquis 10 mg bid till 09/07/2021, then down to 5 mg bid till end of 12/2021.  Outpatient referral to Neurology.   Breast mass: With axillary, mediastinal and hilar lymphadenopathy High suspiion for malignancy Outpatient f/u scheduled with Dr. Lindi Adie 09/11/2021 Anticipating that he may need biopsy. If possible, recommend the biopsy without interruption of  eliquis to minimize cardio embolic risk. If eliquis must be held, recommend doing it so for no more than 2 days.     Nigel Mormon, MD Pager: [phone] Office: [phone]

## 2021-09-05 ENCOUNTER — Encounter: Payer: Self-pay | Admitting: Cardiology

## 2021-09-05 ENCOUNTER — Telehealth: Payer: Self-pay | Admitting: Hematology and Oncology

## 2021-09-05 NOTE — Telephone Encounter (Signed)
R/s pt's new pt appt with Dr. Lindi Adie per 2/28 msg from nurse navigator. Pt is aware of new appt date and time.

## 2021-09-06 ENCOUNTER — Other Ambulatory Visit: Payer: Self-pay | Admitting: Hematology and Oncology

## 2021-09-06 ENCOUNTER — Ambulatory Visit: Payer: BC Managed Care – PPO | Admitting: Student

## 2021-09-06 ENCOUNTER — Ambulatory Visit
Admission: RE | Admit: 2021-09-06 | Discharge: 2021-09-06 | Disposition: A | Discharge status: Home / Self Care | Payer: BC Managed Care – PPO | Source: Ambulatory Visit | Attending: Hematology and Oncology | Admitting: Hematology and Oncology

## 2021-09-06 DIAGNOSIS — R922 Inconclusive mammogram: Secondary | ICD-10-CM | POA: Diagnosis not present

## 2021-09-06 DIAGNOSIS — N63 Unspecified lump in unspecified breast: Secondary | ICD-10-CM

## 2021-09-06 DIAGNOSIS — R2231 Localized swelling, mass and lump, right upper limb: Secondary | ICD-10-CM

## 2021-09-06 DIAGNOSIS — R599 Enlarged lymph nodes, unspecified: Secondary | ICD-10-CM

## 2021-09-06 DIAGNOSIS — N6489 Other specified disorders of breast: Secondary | ICD-10-CM | POA: Diagnosis not present

## 2021-09-06 IMAGING — US US AXILLARY RIGHT
1 series · 6 of 6 positions shown · non-contrast
Comparison: [DATE], [DATE]

CLINICAL DATA: Palpable mass in the LEFT breast for 8 months. CT
exam performed [DATE] in the emergency department shows
centimeter LEFT breast mass and LEFT axillary adenopathy. Patient
has had recent stroke and has LEFT ventricular thrombus based on MRI
[DATE]

EXAM:
DIGITAL DIAGNOSTIC BILATERAL MAMMOGRAM WITH TOMOSYNTHESIS AND CAD;
US AXILLARY RIGHT; ULTRASOUND LEFT BREAST LIMITED
TECHNIQUE: Bilateral digital diagnostic mammography and breast tomosynthesis
was performed. The images were evaluated with computer-aided
detection.; Targeted ultrasound examination of the right axilla was
performed.; Targeted ultrasound examination of the left breast was
performed.

[Series 1: us axillary right · 0.07mm/px · 6 of 6 slices shown]
[im 1/6]
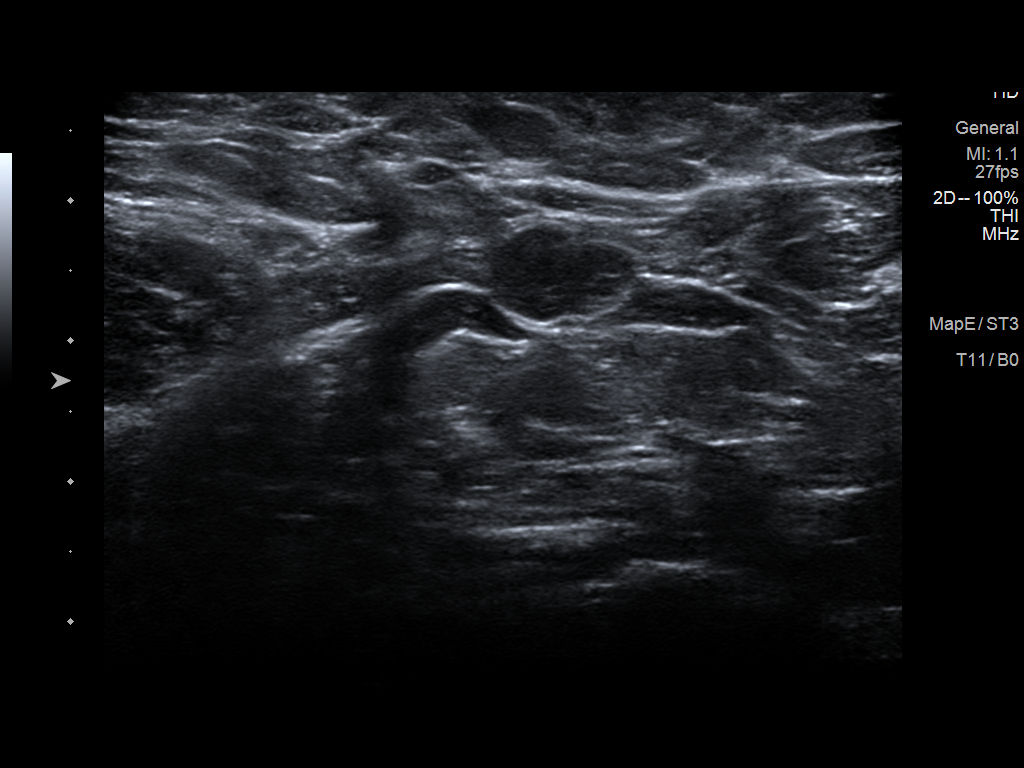
[im 2/6]
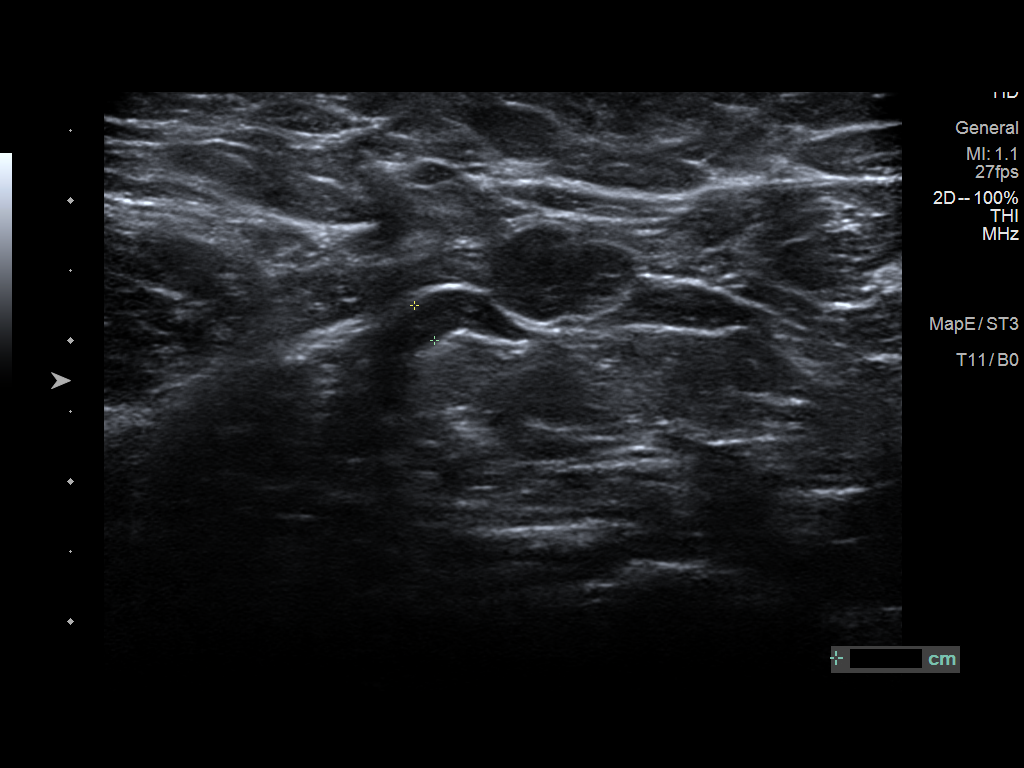
[im 3/6]
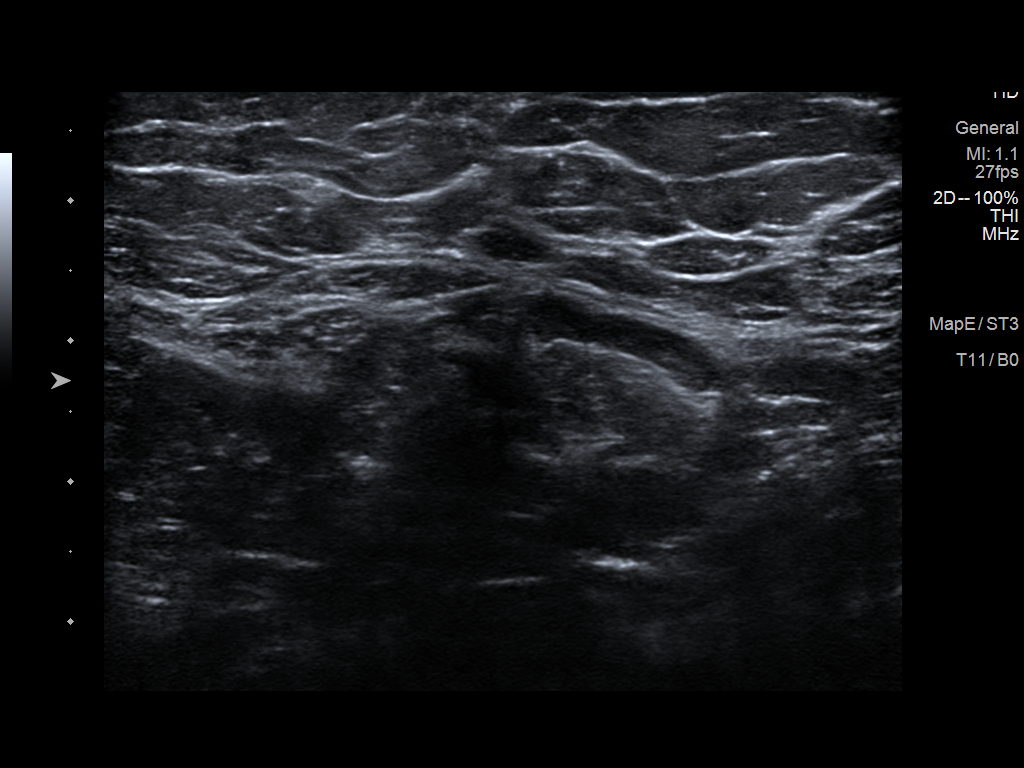
[im 4/6]
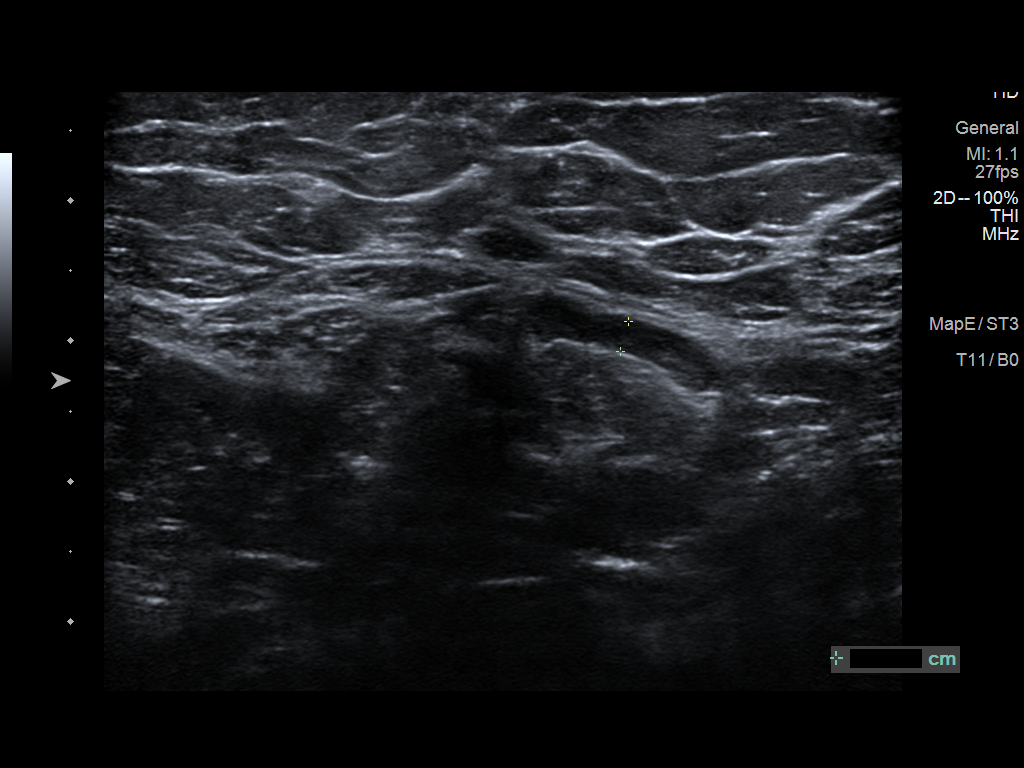
[im 5/6]
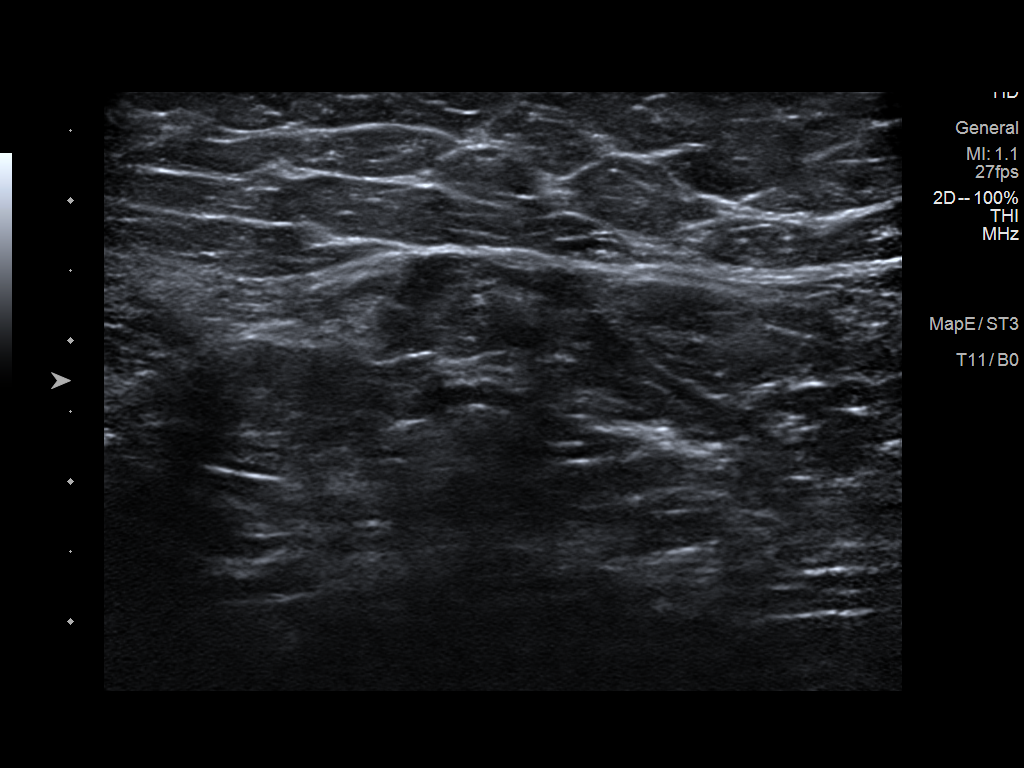
[im 6/6]
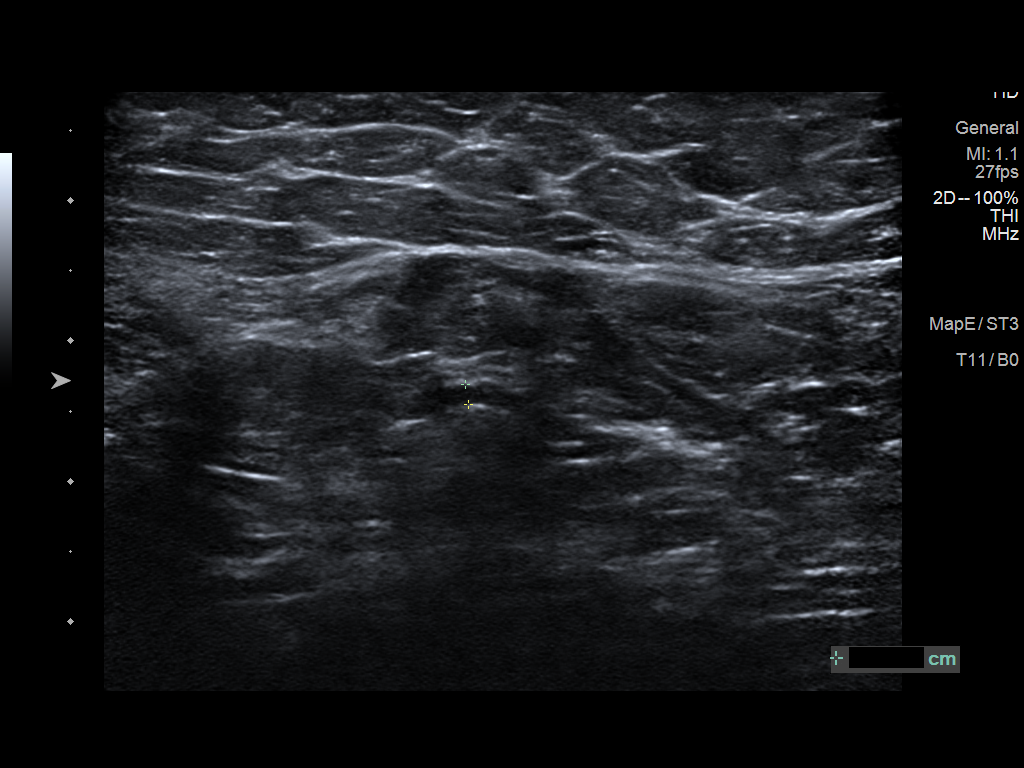

[6 of 6 positions shown; findings below may reference images not displayed]

ACR Breast Density Category b: There are scattered areas of
fibroglandular density.
FINDINGS: RIGHT breast: There is minimal retroareolar fibroglandular tissue.
Otherwise the breast is negative. Partially imaged RIGHT axillary
lymph node is indeterminate.

Targeted ultrasound is performed, showing normal RIGHT axillary
lymph nodes.

LEFT breast: There is irregular mass associated with spiculated
margins in the immediate retroareolar region of the LEFT breast,
retracting the LEFT nipple. Numerous enlarged LEFT axillary lymph
nodes are present.

On physical exam, the LEFT nipple is retracted and fixed. I palpate
a mass measuring at least 5 centimeters in the immediate
retroareolar region of the LEFT breast.

Targeted ultrasound is performed, showing an irregular hypoechoic
mass in the retroareolar region of the LEFT breast measuring 4.0 x
2.2 x 4.1 centimeters. Mass is associated with increased
vascularity.

Evaluation of the LEFT axilla shows numerous lymph nodes with
cortical thickening. A total of 5 abnormal lymph nodes are present.
A single LOWER axillary lymph node has normal morphology.
IMPRESSION: 1. RIGHT breast and axilla are negative.
2. Suspicious mass in the retroareolar region of the LEFT breast.
3. Five abnormal LEFT axillary nodes.

RECOMMENDATION:
Recommend ultrasound-guided core biopsy of LEFT breast mass and 1 of
the enlarged LEFT axillary.

Patient should continue Eliquis.

I have discussed the findings and recommendations with the patient
and his mother. If applicable, a reminder letter will be sent to the
patient regarding the next appointment.

BI-RADS CATEGORY  5: Highly suggestive of malignancy.

## 2021-09-06 IMAGING — US US BREAST*L* LIMITED INC AXILLA
1 series · 13 of 23 positions shown · non-contrast
Comparison: [DATE], [DATE]

CLINICAL DATA: Palpable mass in the LEFT breast for 8 months. CT
exam performed [DATE] in the emergency department shows
centimeter LEFT breast mass and LEFT axillary adenopathy. Patient
has had recent stroke and has LEFT ventricular thrombus based on MRI
[DATE]

EXAM:
DIGITAL DIAGNOSTIC BILATERAL MAMMOGRAM WITH TOMOSYNTHESIS AND CAD;
US AXILLARY RIGHT; ULTRASOUND LEFT BREAST LIMITED
TECHNIQUE: Bilateral digital diagnostic mammography and breast tomosynthesis
was performed. The images were evaluated with computer-aided
detection.; Targeted ultrasound examination of the right axilla was
performed.; Targeted ultrasound examination of the left breast was
performed.

[Series 1: us breast*left* limited inc axilla · 0.07mm/px · 23 acquisitions, 13 frames shown]
[im 1/23]
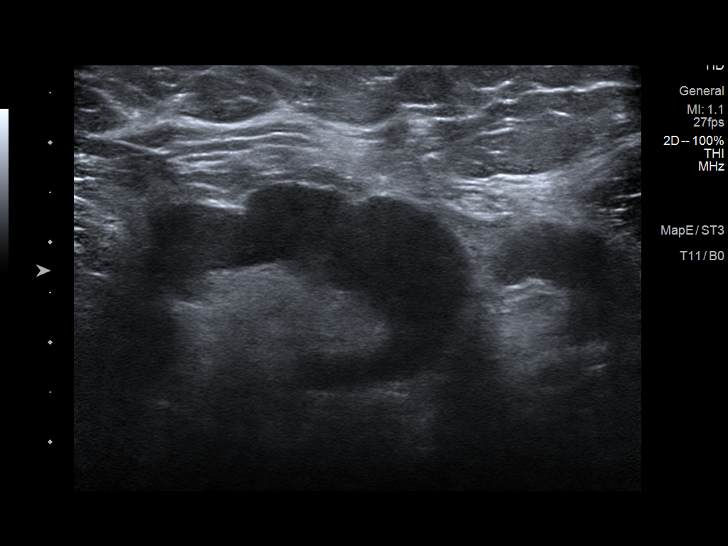
[im 3/23]
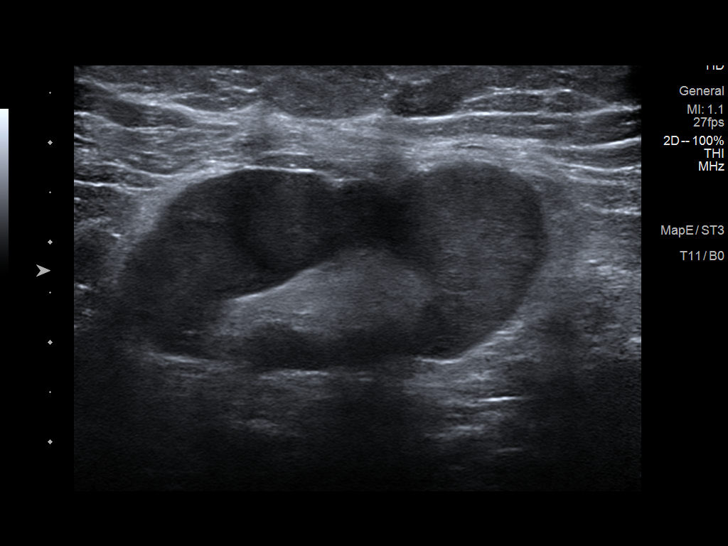
[im 5/23]
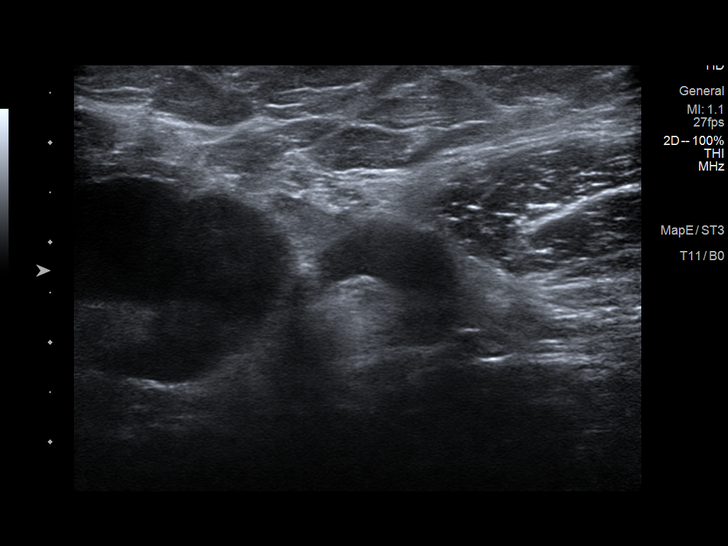
[im 7/23]
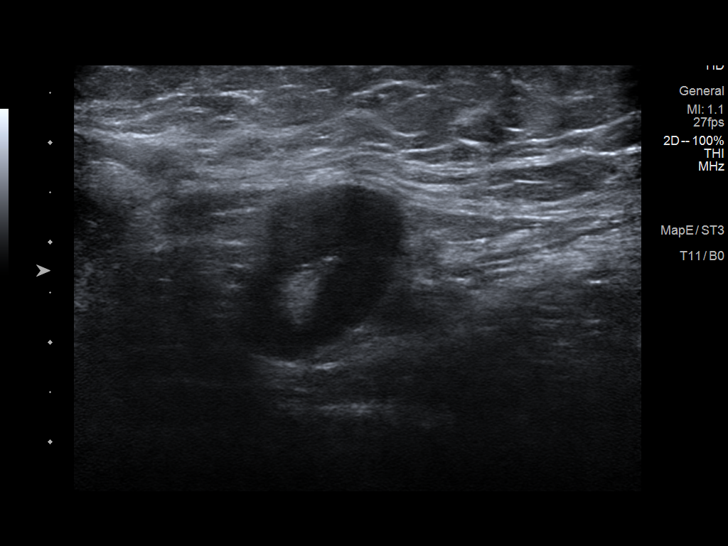
[im 8/23]
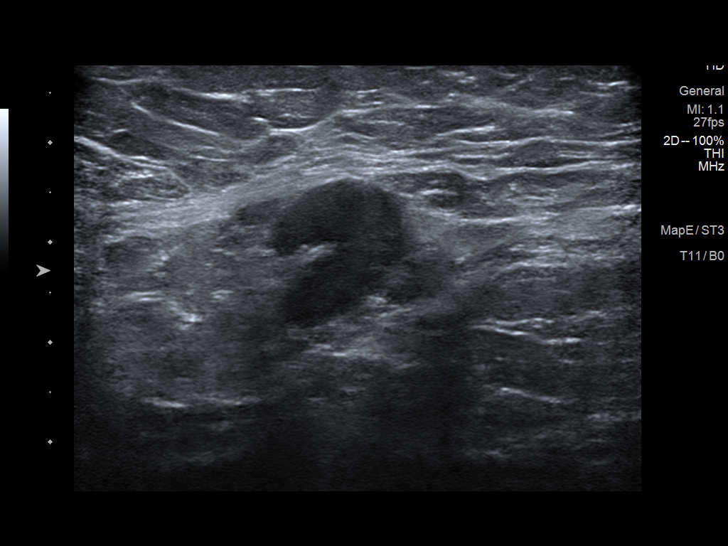
[im 10/23]
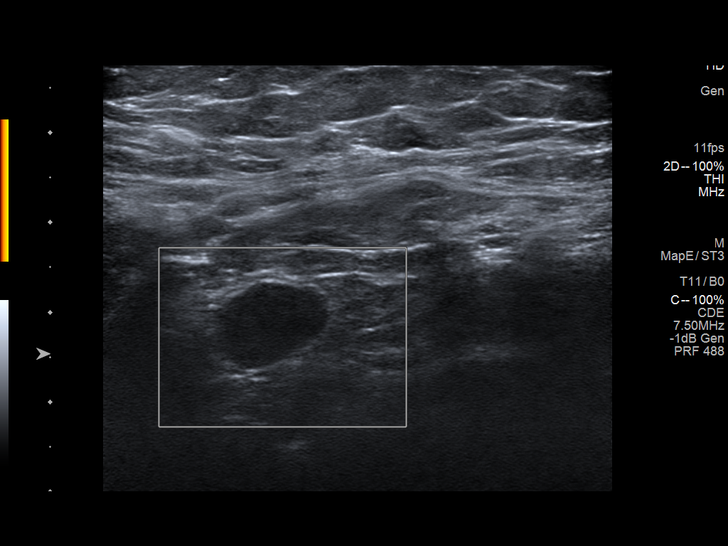
[im 12/23]
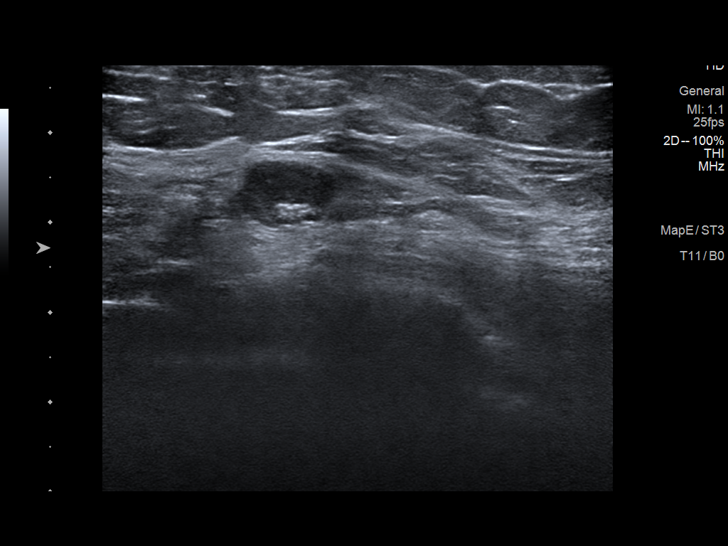
[im 14/23]
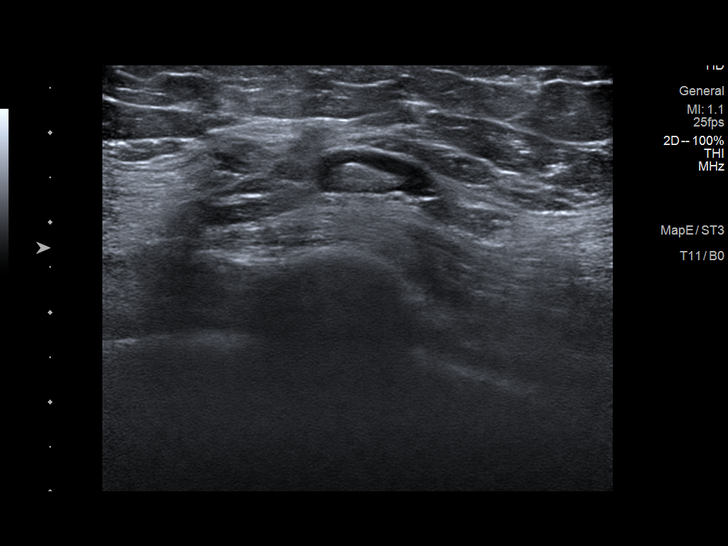
[im 16/23]
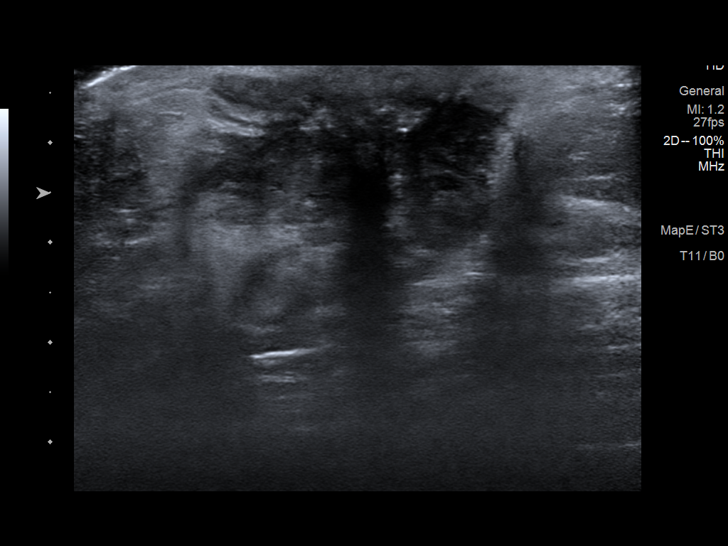
[im 17/23]
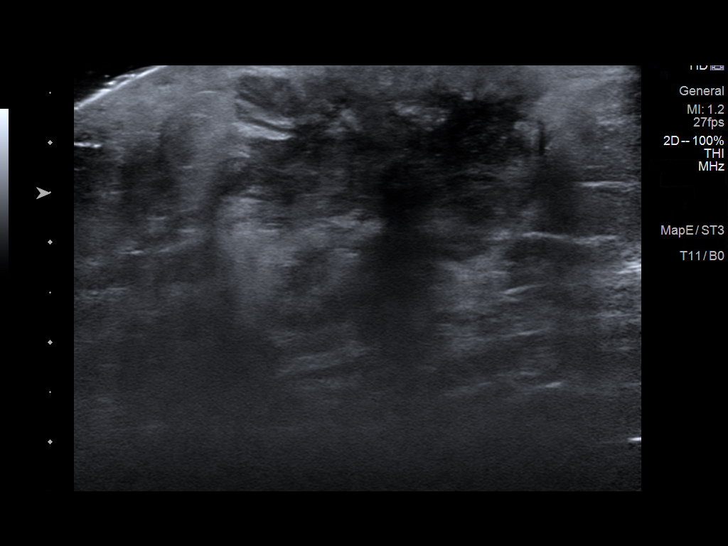
[im 19/23]
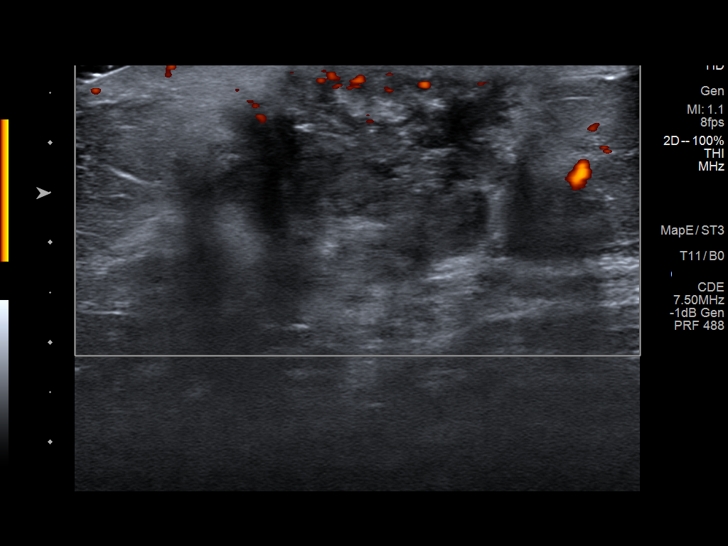
[im 21/23]
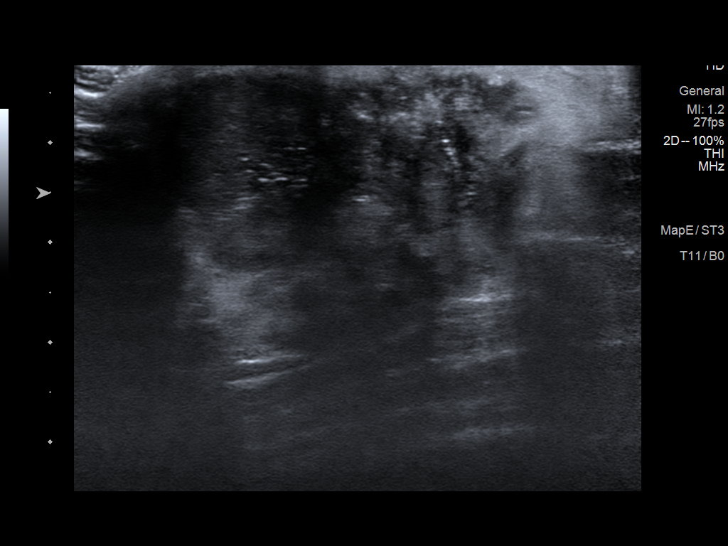
[im 23/23]
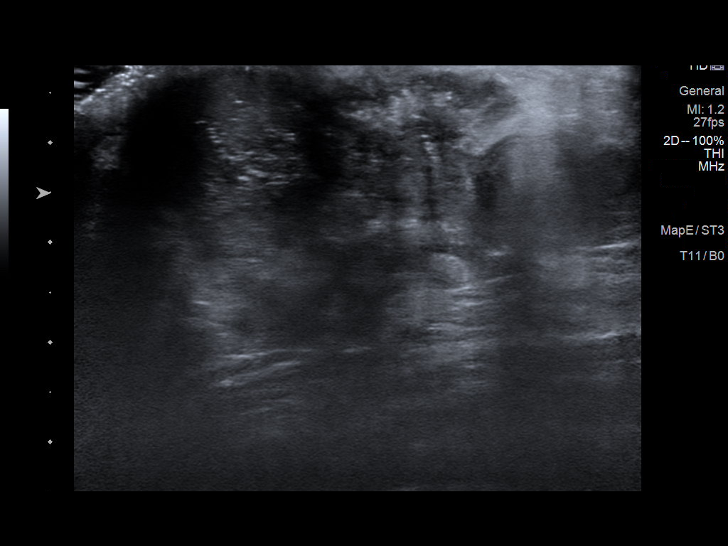

[13 of 23 positions shown; findings below may reference images not displayed]

ACR Breast Density Category b: There are scattered areas of
fibroglandular density.
FINDINGS: RIGHT breast: There is minimal retroareolar fibroglandular tissue.
Otherwise the breast is negative. Partially imaged RIGHT axillary
lymph node is indeterminate.

Targeted ultrasound is performed, showing normal RIGHT axillary
lymph nodes.

LEFT breast: There is irregular mass associated with spiculated
margins in the immediate retroareolar region of the LEFT breast,
retracting the LEFT nipple. Numerous enlarged LEFT axillary lymph
nodes are present.

On physical exam, the LEFT nipple is retracted and fixed. I palpate
a mass measuring at least 5 centimeters in the immediate
retroareolar region of the LEFT breast.

Targeted ultrasound is performed, showing an irregular hypoechoic
mass in the retroareolar region of the LEFT breast measuring 4.0 x
2.2 x 4.1 centimeters. Mass is associated with increased
vascularity.

Evaluation of the LEFT axilla shows numerous lymph nodes with
cortical thickening. A total of 5 abnormal lymph nodes are present.
A single LOWER axillary lymph node has normal morphology.
IMPRESSION: 1. RIGHT breast and axilla are negative.
2. Suspicious mass in the retroareolar region of the LEFT breast.
3. Five abnormal LEFT axillary nodes.

RECOMMENDATION:
Recommend ultrasound-guided core biopsy of LEFT breast mass and 1 of
the enlarged LEFT axillary.

Patient should continue Eliquis.

I have discussed the findings and recommendations with the patient
and his mother. If applicable, a reminder letter will be sent to the
patient regarding the next appointment.

BI-RADS CATEGORY  5: Highly suggestive of malignancy.

## 2021-09-06 IMAGING — MG DIGITAL DIAGNOSTIC BILAT W/ TOMO W/ CAD
6 of 10 series · 6 of 30 positions shown · non-contrast
Comparison: [DATE], [DATE]

CLINICAL DATA: Palpable mass in the LEFT breast for 8 months. CT
exam performed [DATE] in the emergency department shows
centimeter LEFT breast mass and LEFT axillary adenopathy. Patient
has had recent stroke and has LEFT ventricular thrombus based on MRI
[DATE]

EXAM:
DIGITAL DIAGNOSTIC BILATERAL MAMMOGRAM WITH TOMOSYNTHESIS AND CAD;
US AXILLARY RIGHT; ULTRASOUND LEFT BREAST LIMITED
TECHNIQUE: Bilateral digital diagnostic mammography and breast tomosynthesis
was performed. The images were evaluated with computer-aided
detection.; Targeted ultrasound examination of the right axilla was
performed.; Targeted ultrasound examination of the left breast was
performed.

[L TAN synth-2D]
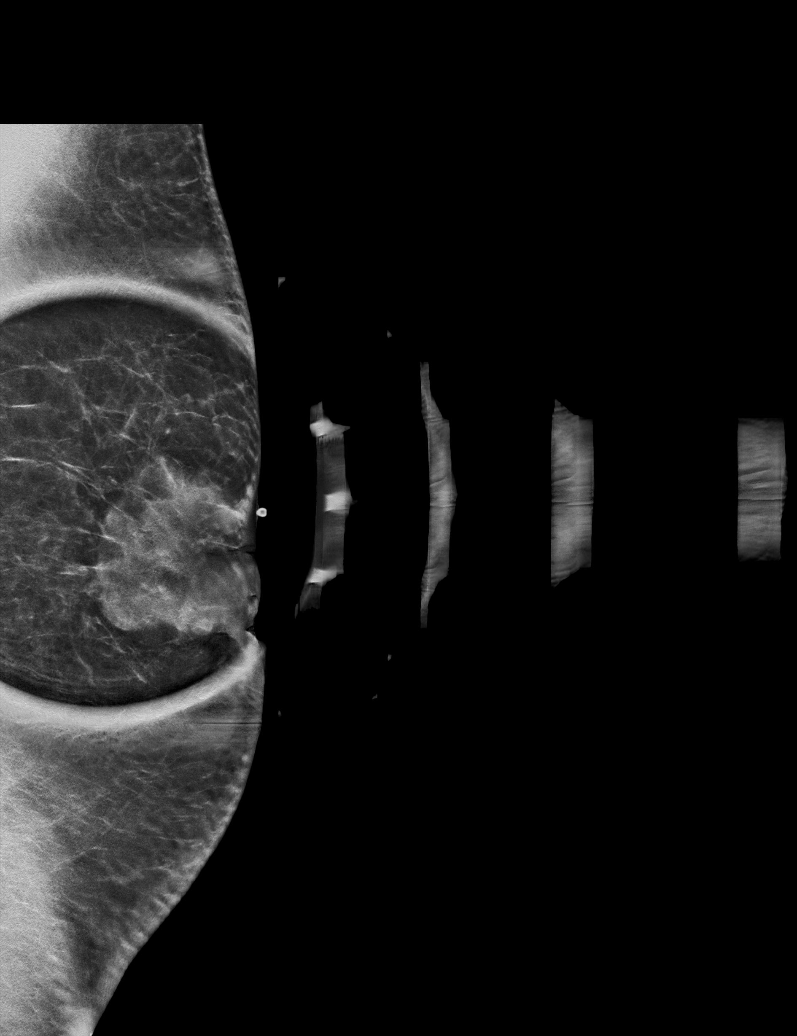

[R CC synth-2D]
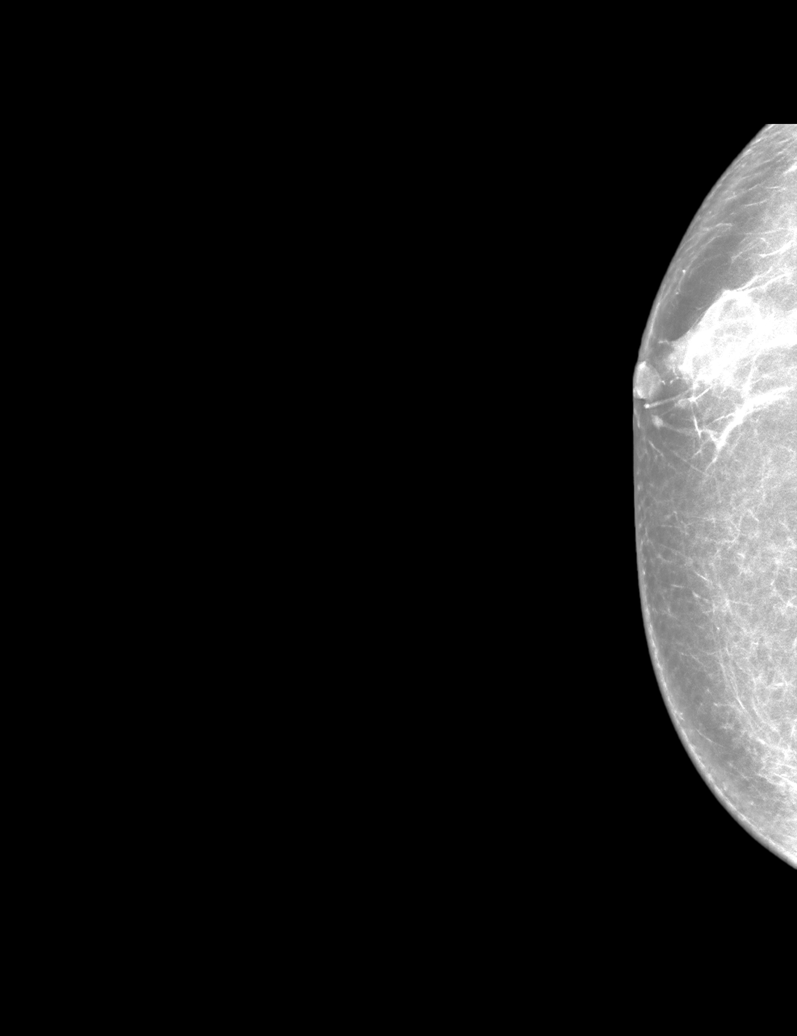

[L CC synth-2D]
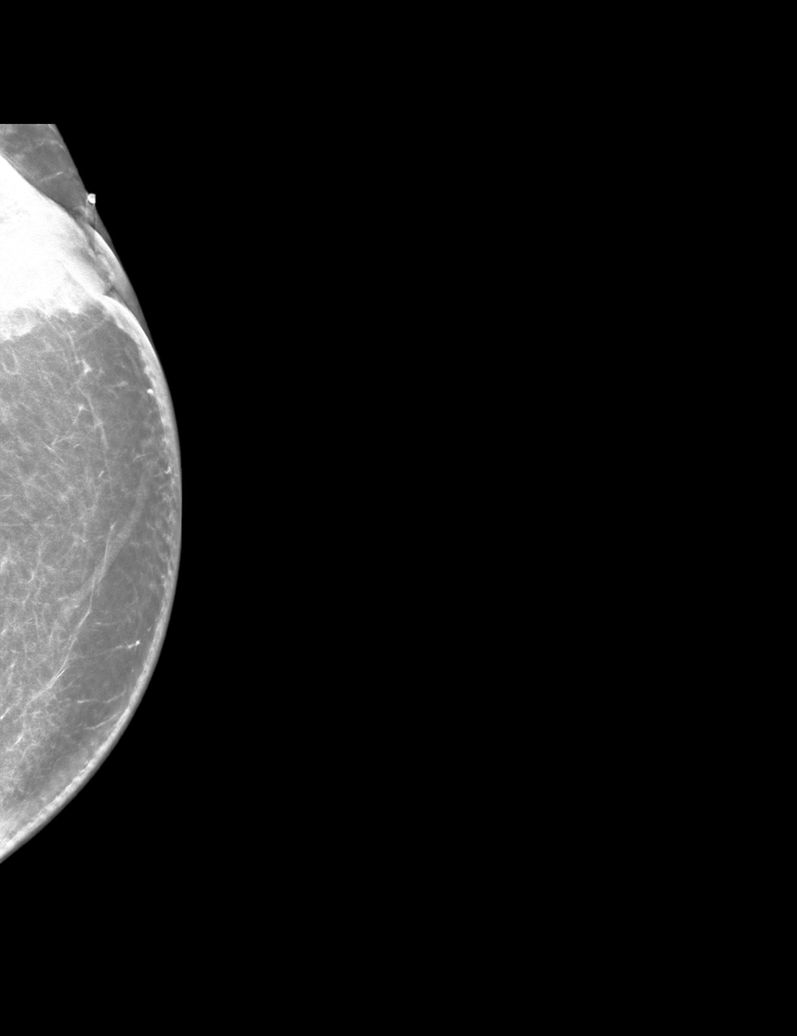

[R MLO synth-2D]
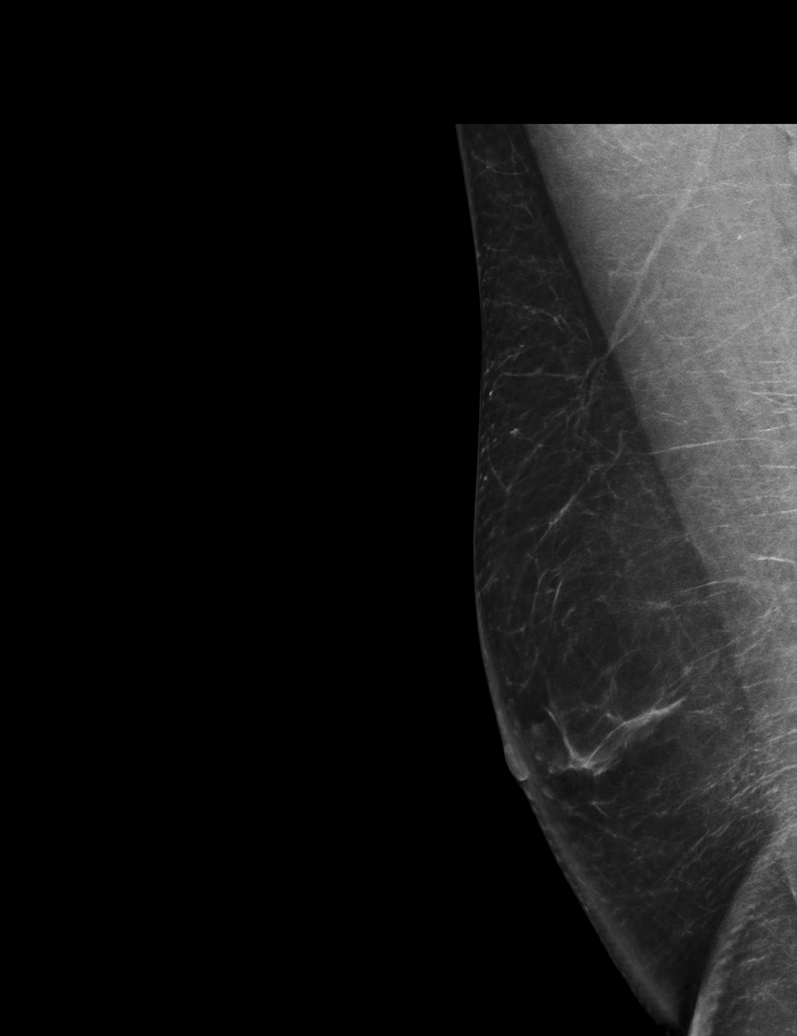

[L MLO synth-2D]
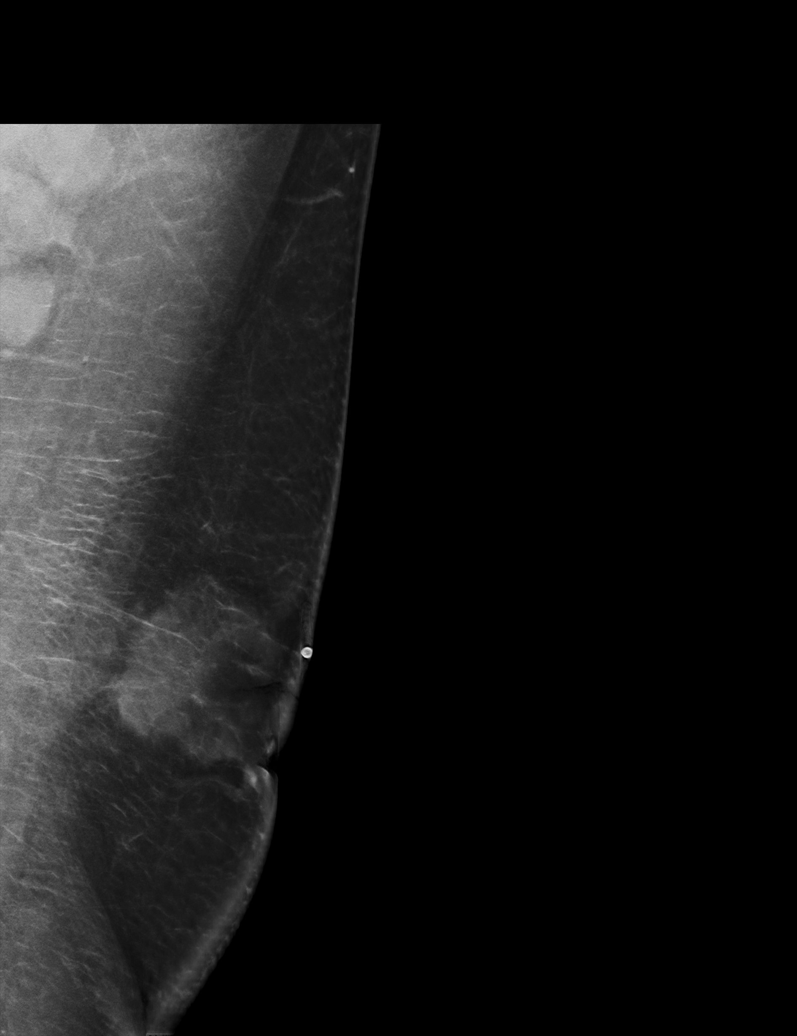

[R MLO tomo · tomo slice 41/82.0]
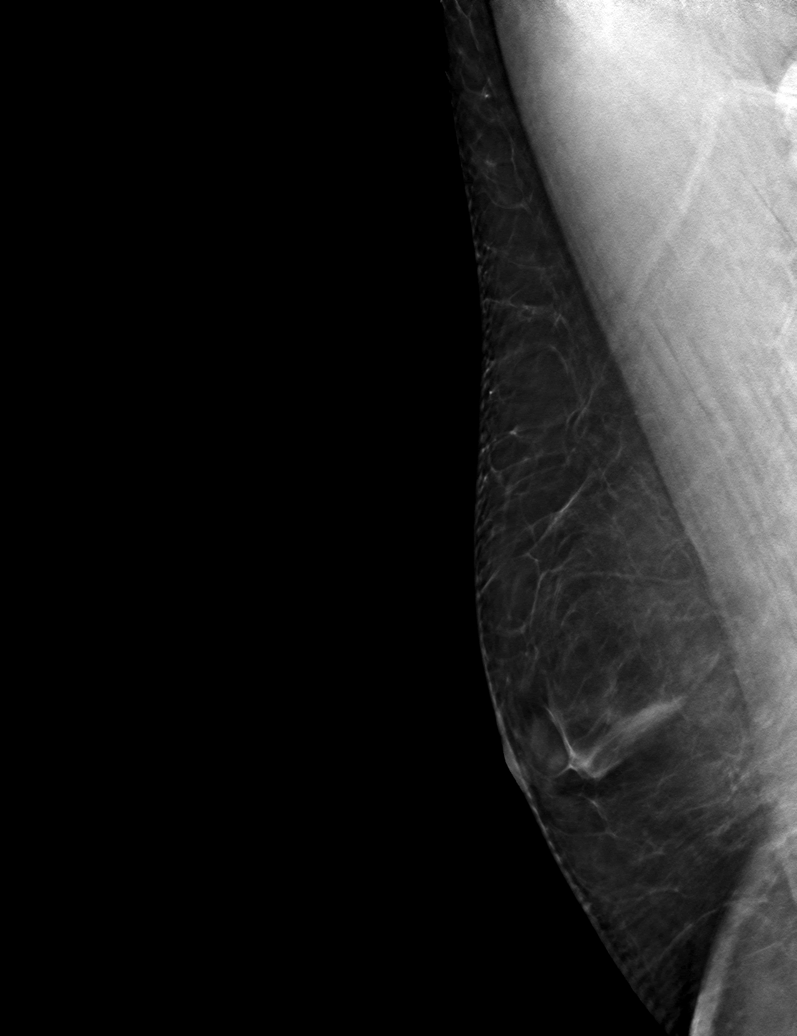

[6 of 30 positions shown; findings below may reference images not displayed]

ACR Breast Density Category b: There are scattered areas of
fibroglandular density.
FINDINGS: RIGHT breast: There is minimal retroareolar fibroglandular tissue.
Otherwise the breast is negative. Partially imaged RIGHT axillary
lymph node is indeterminate.

Targeted ultrasound is performed, showing normal RIGHT axillary
lymph nodes.

LEFT breast: There is irregular mass associated with spiculated
margins in the immediate retroareolar region of the LEFT breast,
retracting the LEFT nipple. Numerous enlarged LEFT axillary lymph
nodes are present.

On physical exam, the LEFT nipple is retracted and fixed. I palpate
a mass measuring at least 5 centimeters in the immediate
retroareolar region of the LEFT breast.

Targeted ultrasound is performed, showing an irregular hypoechoic
mass in the retroareolar region of the LEFT breast measuring 4.0 x
2.2 x 4.1 centimeters. Mass is associated with increased
vascularity.

Evaluation of the LEFT axilla shows numerous lymph nodes with
cortical thickening. A total of 5 abnormal lymph nodes are present.
A single LOWER axillary lymph node has normal morphology.
IMPRESSION: 1. RIGHT breast and axilla are negative.
2. Suspicious mass in the retroareolar region of the LEFT breast.
3. Five abnormal LEFT axillary nodes.

RECOMMENDATION:
Recommend ultrasound-guided core biopsy of LEFT breast mass and 1 of
the enlarged LEFT axillary.

Patient should continue Eliquis.

I have discussed the findings and recommendations with the patient
and his mother. If applicable, a reminder letter will be sent to the
patient regarding the next appointment.

BI-RADS CATEGORY  5: Highly suggestive of malignancy.

## 2021-09-07 ENCOUNTER — Encounter: Payer: Self-pay | Admitting: *Deleted

## 2021-09-11 ENCOUNTER — Encounter (HOSPITAL_COMMUNITY): Payer: BC Managed Care – PPO

## 2021-09-11 ENCOUNTER — Telehealth (HOSPITAL_COMMUNITY): Payer: Self-pay

## 2021-09-11 ENCOUNTER — Inpatient Hospital Stay: Payer: BC Managed Care – PPO | Admitting: Hematology and Oncology

## 2021-09-11 NOTE — Telephone Encounter (Signed)
Attempted to call patient in regards to Cardiac Rehab - Unable to leave a VM, pt VM box is full. ?

## 2021-09-11 NOTE — Telephone Encounter (Signed)
Pt insurance is active and benefits verified through [employer] Co-pay 0, DED $400/$400 met, out of pocket $2,500/$914.64 met, co-insurance 15%. no pre-authorization required. Passport, 09/11/2021@8 :19am, REF# [id number] ?  ?Will contact patient to see if he is interested in the Cardiac Rehab Program. If interested, patient will need to complete follow up appt. Once completed, patient will be contacted for scheduling upon review by the RN Navigator. ?

## 2021-09-12 ENCOUNTER — Ambulatory Visit: Payer: BC Managed Care – PPO | Admitting: Neurology

## 2021-09-13 ENCOUNTER — Ambulatory Visit (HOSPITAL_COMMUNITY)
Admit: 2021-09-13 | Discharge: 2021-09-13 | Disposition: A | Discharge status: Home / Self Care | Payer: BC Managed Care – PPO | Attending: Family Medicine | Admitting: Family Medicine

## 2021-09-13 ENCOUNTER — Encounter (HOSPITAL_COMMUNITY): Payer: Self-pay

## 2021-09-13 ENCOUNTER — Telehealth (HOSPITAL_COMMUNITY): Payer: Self-pay

## 2021-09-13 ENCOUNTER — Other Ambulatory Visit: Payer: Self-pay

## 2021-09-13 VITALS — BP 108/80 | HR 86 | Wt 253.0 lb

## 2021-09-13 DIAGNOSIS — Z8249 Family history of ischemic heart disease and other diseases of the circulatory system: Secondary | ICD-10-CM | POA: Diagnosis not present

## 2021-09-13 DIAGNOSIS — I513 Intracardiac thrombosis, not elsewhere classified: Secondary | ICD-10-CM | POA: Diagnosis not present

## 2021-09-13 DIAGNOSIS — I5022 Chronic systolic (congestive) heart failure: Secondary | ICD-10-CM | POA: Diagnosis not present

## 2021-09-13 DIAGNOSIS — N6320 Unspecified lump in the left breast, unspecified quadrant: Secondary | ICD-10-CM | POA: Diagnosis not present

## 2021-09-13 DIAGNOSIS — Z87891 Personal history of nicotine dependence: Secondary | ICD-10-CM | POA: Diagnosis not present

## 2021-09-13 DIAGNOSIS — Z79899 Other long term (current) drug therapy: Secondary | ICD-10-CM | POA: Insufficient documentation

## 2021-09-13 DIAGNOSIS — Z8673 Personal history of transient ischemic attack (TIA), and cerebral infarction without residual deficits: Secondary | ICD-10-CM

## 2021-09-13 DIAGNOSIS — Z7984 Long term (current) use of oral hypoglycemic drugs: Secondary | ICD-10-CM | POA: Insufficient documentation

## 2021-09-13 DIAGNOSIS — R590 Localized enlarged lymph nodes: Secondary | ICD-10-CM | POA: Diagnosis not present

## 2021-09-13 DIAGNOSIS — F1090 Alcohol use, unspecified, uncomplicated: Secondary | ICD-10-CM | POA: Diagnosis not present

## 2021-09-13 DIAGNOSIS — Z7901 Long term (current) use of anticoagulants: Secondary | ICD-10-CM | POA: Insufficient documentation

## 2021-09-13 DIAGNOSIS — R55 Syncope and collapse: Secondary | ICD-10-CM

## 2021-09-13 DIAGNOSIS — R9431 Abnormal electrocardiogram [ECG] [EKG]: Secondary | ICD-10-CM | POA: Insufficient documentation

## 2021-09-13 LAB — BASIC METABOLIC PANEL
Anion gap: 9 (ref 5–15)
BUN: 10 mg/dL (ref 6–20)
CO2: 25 mmol/L (ref 22–32)
Calcium: 10 mg/dL (ref 8.9–10.3)
Chloride: 107 mmol/L (ref 98–111)
Creatinine, Ser: 1.1 mg/dL (ref 0.61–1.24)
GFR, Estimated: >60 mL/min (ref >60)
Glucose, Bld: 96 mg/dL (ref 70–99)
Potassium: 5.2 mmol/L — ABNORMAL HIGH (ref 3.5–5.1)
Sodium: 141 mmol/L (ref 135–145)

## 2021-09-13 LAB — CBC
HCT: 55.1 % — ABNORMAL HIGH (ref 39.0–52.0)
Hemoglobin: 17.9 g/dL — ABNORMAL HIGH (ref 13.0–17.0)
MCH: 30.2 pg (ref 26.0–34.0)
MCHC: 32.5 g/dL (ref 30.0–36.0)
MCV: 92.9 fL (ref 80.0–100.0)
Platelets: 281 10*3/uL (ref 150–400)
RBC: 5.93 MIL/uL — ABNORMAL HIGH (ref 4.22–5.81)
RDW: 13.2 % (ref 11.5–15.5)
WBC: 8.5 10*3/uL (ref 4.0–10.5)
nRBC: 0 % (ref 0.0–0.2)

## 2021-09-13 LAB — DIGOXIN LEVEL: Digoxin Level: 0.6 ng/mL — ABNORMAL LOW (ref 0.8–2.0)

## 2021-09-13 NOTE — Telephone Encounter (Addendum)
Pt aware, agreeable, and verbalized understanding ? ?Lab appointment scheduled for next week ? ?----- Message from Rafael Bihari, FNP sent at 09/13/2021 12:11 PM EST ----- ?K elevated. Hold eplerenone x 1 day, then reduce dose to 25 mg daily.  ? ?Stop any KCL supplements and reduce K-rich foods in diet.  ? ?Repeat BMET in 1 week to ensure K is normal. ?

## 2021-09-13 NOTE — Progress Notes (Signed)
ADVANCED HF CLINIC CONSULT NOTE Primary Care: Pcp, No General Cardiologist: Dr. Virgina Jock HF Cardiologist: Dr. Aundra Dubin  HPI: Michael Stuart is a 41 y.o. male with minimal previous past medical history, and new diagnosis of systolic heart failure, LV thrombus and breast mass (concerning for CA).  Admitted 1/61 with new systolic heart failure. Echo showed dilated LV with EF 20-25%.  LHC/RHC was done, showing elevated filling pressures but relatively preserved cardiac index of 2.3.  There was no significant coronary disease. cMRI showed LV dilation with EF 10%, LV thrombus, mildly dilated RV with EF 15%.  Delayed enhancement imaging showed inferolateral LGE that could be a OM territory infarct, but corresponding lesion not seen by cath. Most likely acute myocarditis given subepicardial apical inferior LGE also (the RV insertion site LGE can be nonspecific in the setting of volume/pressure overload). Elevated T2 in the apical inferior wall is more suggestive of acute myocarditis. 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 He was started on Coumadin for thrombus. He had transient LOC after MRI and was orthostatic, felt to be vagally-mediated. Also noted to have breast mass and new axillary lymphadenopathy, suspect breast cancer. Patient was diuresed and started on GDMT.  He was discharged with LifeVest with oncology follow up.  Readmitted 1 day after discharge with stroke-like symptoms of new left facial numbness, left facial drop and vision changes. INR 1.5 on admission, patient compliant with Lovenox bridging since discharge. CT head negative, MRI showed patchy acute and subacute infarcts. Neurology consulted and recommended DOAC given stroke in spite of being on warfarin/Lovenox. Deficits resolved and he was discharged home, weight 254 lbs.  Today he returns for post hospital HF follow up with his mother. Overall feeling fine. He does not have dyspnea with activity or walking up steps.  Denies palpitations, abnormal  bleeding, CP, dizziness, edema, or PND/Orthopnea. Appetite ok. No fever or chills. Weight at home 246.6 pounds. Taking all medications. He is not wearing his LifeVest, vest made him break out under left breast. Stopped smoking, drinking ETOH 1-2 drinks/day. 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  His father has a history of CAD. Dr. Virgina Jock advised no driving.  ECG (personally reviewed): NSR 75 bpm  LifeVest interrogation (personally reviewed): average HR 83, daily steps 3393, no treatments.  Labs (2/23): K 4.9, creatinine 0.96, hgb 16.2  Cardiac Studies: - Echo (2/23): dilated LV with EF 20-25%.  - R/LHC (2/23): howing elevated filling pressures but relatively preserved cardiac index of 2.3.  There was no significant coronary disease.   - cMRI (2/23): LV dilation with LVEF 10%, LV thrombus, mildly dilated RV with RVEF 15%.  Delayed enhancement imaging showed inferolateral LGE that could be a OM territory infarct, but corresponding lesion not seen by cath. Most likely acute myocarditis given subepicardial apical inferior LGE also (the RV insertion site LGE can be nonspecific in the setting of volume/pressure overload). Elevated T2 in the apical inferior wall is more suggestive of acute myocarditis.   Review of Systems: [y] = yes, '[ ]'$  = no   General: Weight gain '[ ]'$ ; Weight loss '[ ]'$ ; Anorexia '[ ]'$ ; Fatigue '[ ]'$ ; Fever '[ ]'$ ; Chills '[ ]'$ ; Weakness '[ ]'$   Cardiac: Chest pain/pressure '[ ]'$ ; Resting SOB '[ ]'$ ; Exertional SOB '[ ]'$ ; Orthopnea '[ ]'$ ; Pedal Edema '[ ]'$ ; Palpitations '[ ]'$ ; Syncope '[ ]'$ ; Presyncope '[ ]'$ ; Paroxysmal nocturnal dyspnea'[ ]'$   Pulmonary: Cough '[ ]'$ ; Wheezing'[ ]'$ ; Hemoptysis'[ ]'$ ; Sputum '[ ]'$ ;  Snoring '[ ]'$   GI: Vomiting'[ ]'$ ; Dysphagia'[ ]'$ ; Melena'[ ]'$ ; Hematochezia '[ ]'$ ; Heartburn'[ ]'$ ; Abdominal pain '[ ]'$ ; Constipation '[ ]'$ ; Diarrhea '[ ]'$ ; BRBPR '[ ]'$   GU: Hematuria'[ ]'$ ; Dysuria '[ ]'$ ; Nocturia'[ ]'$   Vascular: Pain in legs with walking '[ ]'$ ; Pain in feet with lying flat  '[ ]'$ ; Non-healing sores '[ ]'$ ; Stroke Blue.Reese ]; TIA '[ ]'$ ; Slurred speech '[ ]'$ ;  Neuro: Headaches'[ ]'$ ; Vertigo'[ ]'$ ; Seizures'[ ]'$ ; Paresthesias'[ ]'$ ;Blurred vision '[ ]'$ ; Diplopia '[ ]'$ ; Vision changes '[ ]'$   Ortho/Skin: Arthritis '[ ]'$ ; Joint pain '[ ]'$ ; Muscle pain '[ ]'$ ; Joint swelling '[ ]'$ ; Back Pain '[ ]'$ ; Rash '[ ]'$   Psych: Depression'[ ]'$ ; Anxiety'[ ]'$   Heme: Bleeding problems '[ ]'$ ; Clotting disorders '[ ]'$ ; Anemia '[ ]'$   Endocrine: Diabetes '[ ]'$ ; Thyroid dysfunction'[ ]'$   Past Medical History:  Diagnosis Date   CHF (congestive heart failure) (HCC)    Current Outpatient Medications  Medication Sig Dispense Refill   acetaminophen (TYLENOL) 500 MG tablet Take 1,000 mg by mouth every 6 (six) hours as needed for moderate pain.     albuterol (VENTOLIN HFA) 108 (90 Base) MCG/ACT inhaler Inhale 1-2 puffs into the lungs every 6 (six) hours as needed for wheezing or shortness of breath. 1 each 0   antiseptic oral rinse (BIOTENE) LIQD 15 mLs by Mouth Rinse route as needed for dry mouth.     Apixaban (ELIQUIS PO) Take 5 mg by mouth 2 (two) times daily.     atorvastatin (LIPITOR) 20 MG tablet Take 1 tablet (20 mg total) by mouth daily. 30 tablet 0   dapagliflozin propanediol (FARXIGA) 10 MG TABS tablet Take 1 tablet (10 mg total) by mouth daily. 30 tablet 2   digoxin (LANOXIN) 0.125 MG tablet Take 1 tablet (0.125 mg total) by mouth daily. 30 tablet 2   eplerenone (INSPRA) 25 MG tablet Take 2 tablets (50 mg total) by mouth daily. 60 tablet 2   ivabradine (CORLANOR) 5 MG TABS tablet Take 1 tablet (5 mg total) by mouth 2 (two) times daily with a meal. 60 tablet 2   metoprolol succinate (TOPROL-XL) 25 MG 24 hr tablet Take 1 tablet (25 mg total) by mouth daily. 30 tablet 2   PROMETHAZINE HCL PO Take 1 capsule by mouth as needed (sleep).     sacubitril-valsartan (ENTRESTO) 24-26 MG Take 1 tablet by mouth 2 (two) times daily. 60 tablet 2   No current facility-administered medications for this encounter.   Allergies  Allergen Reactions    Bactrim [Sulfamethoxazole-Trimethoprim] Other (See Comments)    Blood Capillaries Rupture    Social History   Socioeconomic History   Marital status: Married    Spouse name: Not on file   Number of children: 2   Years of education: Not on file   Highest education level: Bachelor's degree (e.g., BA, AB, BS)  Occupational History   Occupation: crash Insurance underwriter    Employer: [employer]  Tobacco Use   Smoking status: Every Day    Packs/day: 0.25    Years: 25.00    Pack years: 6.25    Types: Cigarettes   Smokeless tobacco: Current   Tobacco comments:    Smoked 2PPD x 10 years. Last 13 years 3-4 cigarettes/day.   Vaping Use   Vaping Use: Some days   Substances: Nicotine  Substance and Sexual Activity   Alcohol use: Yes    Alcohol/week: 21.0 standard drinks    Types: 21 Cans of beer per week  Comment: 2 tall boys per day, everyday x 20years   Drug use: Yes    Frequency: 1.0 times per week    Types: Marijuana    Comment: 3x per month   Sexual activity: Not Currently  Other Topics Concern   Not on file  Social History Narrative   Not on file   Social Determinants of Health   Financial Resource Strain: Low Risk    Difficulty of Paying Living Expenses: Not very hard  Food Insecurity: No Food Insecurity   Worried About Charity fundraiser in the Last Year: Never true   Ran Out of Food in the Last Year: Never true  Transportation Needs: No Transportation Needs   Lack of Transportation (Medical): No   Lack of Transportation (Non-Medical): No  Physical Activity: Not on file  Stress: Not on file  Social Connections: Not on file  Intimate Partner Violence: Not on file   Family History  Problem Relation Age of Onset   Healthy Mother    BP 108/80    Pulse 86    Wt 114.8 kg (253 lb)    SpO2 98%    BMI 31.62 kg/m   Wt Readings from Last 3 Encounters:  09/13/21 114.8 kg (253 lb)  09/04/21 115.2 kg (254 lb)  09/02/21 113.7 kg (250 lb 10.6 oz)   PHYSICAL EXAM: General:   NAD. No resp difficulty HEENT: Normal Neck: Supple. No JVD. Carotids 2+ bilat; no bruits. No lymphadenopathy or thryomegaly appreciated. Cor: PMI nondisplaced. Regular rate & rhythm. No rubs, gallops or murmurs. Lungs: Clear Abdomen: Soft, nontender, nondistended. No hepatosplenomegaly. No bruits or masses. Good bowel sounds. Extremities: No cyanosis, clubbing, rash, edema Neuro: Alert & oriented x 3, cranial nerves grossly intact. Moves all 4 extremities w/o difficulty. Affect pleasant.  ASSESSMENT & PLAN: 1. Chronic Systolic CHF: Patient presents with CHF after a viral-type illness. HIV, TSH, COVID-19 test, and UDS were all negative. I do not think he drinks enough ETOH to cause CMP. Patient has no family history of cardiomyopathy.  LHC/RHC showed no significant CAD, elevated filling pressures and relatively preserved cardiac output. Echo showed EF 20-25%.   Cardiac MRI was done today, showing severe LV dilation with EF 10%, LV thrombus, mildly dilated RV with EF 15%.  Delayed enhancement imaging showed inferolateral LGE that could be a OM territory infarct, but corresponding lesion not seen by cath. Overall, most likely acute myocarditis given subepicardial apical inferior LGE also (the RV insertion site LGE can be nonspecific in the setting of volume/pressure overload). Elevated T2 in the apical inferior wall is more suggestive of acute myocarditis. 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 Stable NYHA II, he is not volume overloaded on exam. - He does not need daily loops.  - Continue Toprol XL 25 mg daily.   - Continue Farxiga 10 mg daily. - Continue ivabradine 5 mg bid. HR 86 today - Continue digoxin 0.125 daily. Check dig level today. - Continue Entresto 24/26 mg bid.  - Continue eplerenone 50 mg daily. BMET today. - With profound LV dysfunction and areas of LGE, Continue LifeVest. I asked him to call [employer] rep about vest, may be a fit  issue causing the rash.  - Will repeat echo at 3 months to see if he needs ICD.  2. LV thrombus: Previously on Coumadin, now on DOAC with new CVA (see below). - Continue Eliquis. CBC today. 3. 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 Now on Eliquis 5 mg bid. No deficits. - He has neurology follow up soon. 4. Syncope: Episode of non-arrhythmic syncope in the MRI, suspect this was vagally-mediated based on history.  BP stable. No further events. 5. Breast mass:  Noted on left by imaging with axillary lymphadenopathy, suspect breast cancer.  This seems unrelated to myocarditis/CHF. No breast pain or tenderness. He has oncology followup. - Continue eplerenone 50 mg daily.   Follow up in 3-4 weeks with PharmD (increase Entresto or Toprol if able), 6 weeks with APP and 12 weeks with Dr. Aundra Dubin + echo.  Allena Katz, FNP-BC 09/13/21

## 2021-09-13 NOTE — Patient Instructions (Signed)
Medication Changes: ? ?No changes ? ?Lab Work: ? ?Labs done today, your results will be available in MyChart, we will contact you for abnormal readings. ? ? ?Testing/Procedures: ? ?Your physician has requested that you have an echocardiogram. Echocardiography is a painless test that uses sound waves to create images of your heart. It provides your doctor with information about the size and shape of your heart and how well your heart?s chambers and valves are working. This procedure takes approximately one hour. There are no restrictions for this procedure. ? ? ?Referrals: ? ?Please follow up with our heart failure pharmacist in 3 weeks ? ? ?Special Instructions // Education: ? ?PLEASE CALL [facility] REP AND INFORM THEM OF THE RASH AND WRONG FIT. ? ?Follow-Up in: 6 weeks with Janett Billow, 12 weeks with Dr. Aundra Dubin ? ?At the [facility], you and your health needs are our priority. We have a designated team specialized in the treatment of Heart Failure. This Care Team includes your primary Heart Failure Specialized Cardiologist (physician), Advanced Practice Providers (APPs- Physician Assistants and Nurse Practitioners), and Pharmacist who all work together to provide you with the care you need, when you need it.  ? ?You may see any of the following providers on your designated Care Team at your next follow up: ? ?Dr Glori Bickers ?Dr Loralie Champagne ?Darrick Grinder, NP ?Lyda Jester, PA ?Jessica Milford,NP ?Marlyce Huge, PA ?Audry Riles, PharmD ? ? ?Please be sure to bring in all your medications bottles to every appointment.  ? ?Need to Contact us: ? ?If you have any questions or concerns before your next appointment please send [place] a message through [place] or call our office at [phone].   ? ?TO LEAVE A MESSAGE FOR THE NURSE SELECT OPTION 2, PLEASE LEAVE A MESSAGE INCLUDING: ?YOUR NAME ?DATE OF BIRTH ?CALL BACK NUMBER ?REASON FOR CALL**this is important as we prioritize the call backs ? ?YOU WILL  RECEIVE A CALL BACK THE SAME DAY AS LONG AS YOU CALL BEFORE 4:00 PM ? ? ?

## 2021-09-15 ENCOUNTER — Ambulatory Visit
Admission: RE | Admit: 2021-09-15 | Discharge: 2021-09-15 | Disposition: A | Discharge status: Home / Self Care | Payer: BC Managed Care – PPO | Source: Ambulatory Visit | Attending: Hematology and Oncology | Admitting: Hematology and Oncology

## 2021-09-15 ENCOUNTER — Other Ambulatory Visit: Payer: Self-pay

## 2021-09-15 DIAGNOSIS — R590 Localized enlarged lymph nodes: Secondary | ICD-10-CM | POA: Diagnosis not present

## 2021-09-15 DIAGNOSIS — N6320 Unspecified lump in the left breast, unspecified quadrant: Secondary | ICD-10-CM

## 2021-09-15 DIAGNOSIS — N63 Unspecified lump in unspecified breast: Secondary | ICD-10-CM

## 2021-09-15 DIAGNOSIS — C773 Secondary and unspecified malignant neoplasm of axilla and upper limb lymph nodes: Secondary | ICD-10-CM | POA: Diagnosis not present

## 2021-09-15 DIAGNOSIS — C50422 Malignant neoplasm of upper-outer quadrant of left male breast: Secondary | ICD-10-CM | POA: Diagnosis not present

## 2021-09-15 DIAGNOSIS — N6321 Unspecified lump in the left breast, upper outer quadrant: Secondary | ICD-10-CM | POA: Diagnosis not present

## 2021-09-15 IMAGING — MG MM BREAST LOCALIZATION CLIP
6 series · 6 of 18 positions shown · non-contrast
Comparison: Previous exam(s).

CLINICAL DATA: Patient status post ultrasound-guided biopsy left
breast mass and left axillary lymph node

EXAM:
3D DIAGNOSTIC LEFT MAMMOGRAM POST ULTRASOUND BIOPSY

[L ML synth-2D]
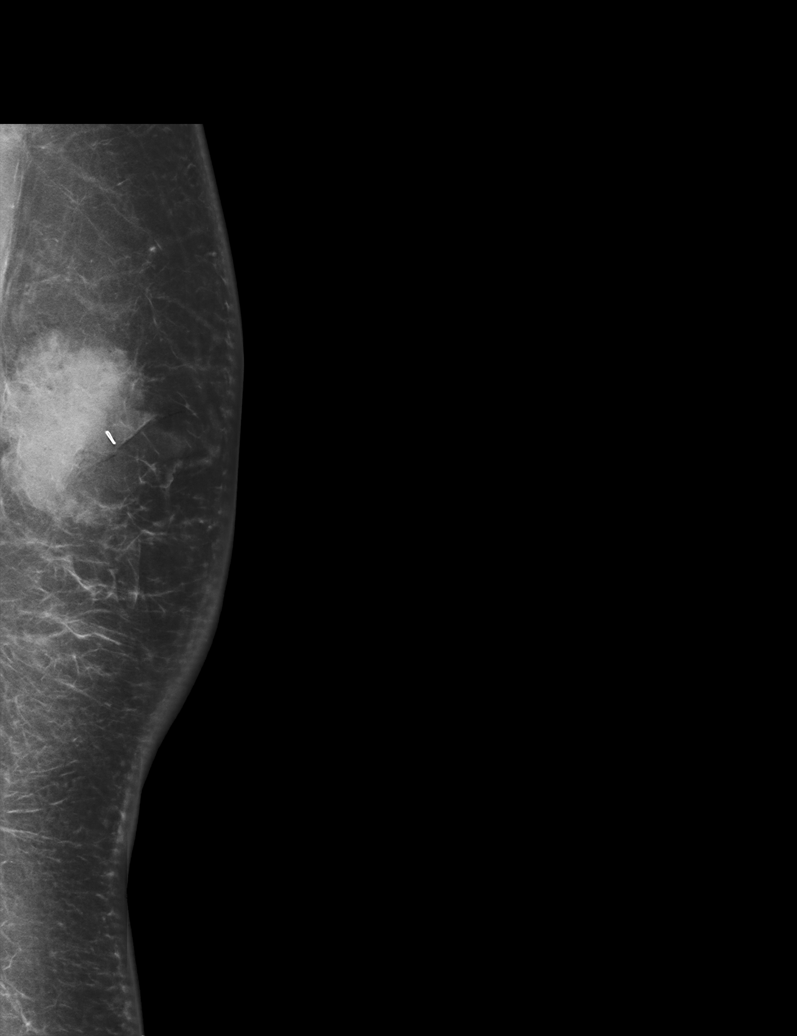

[L CC synth-2D]
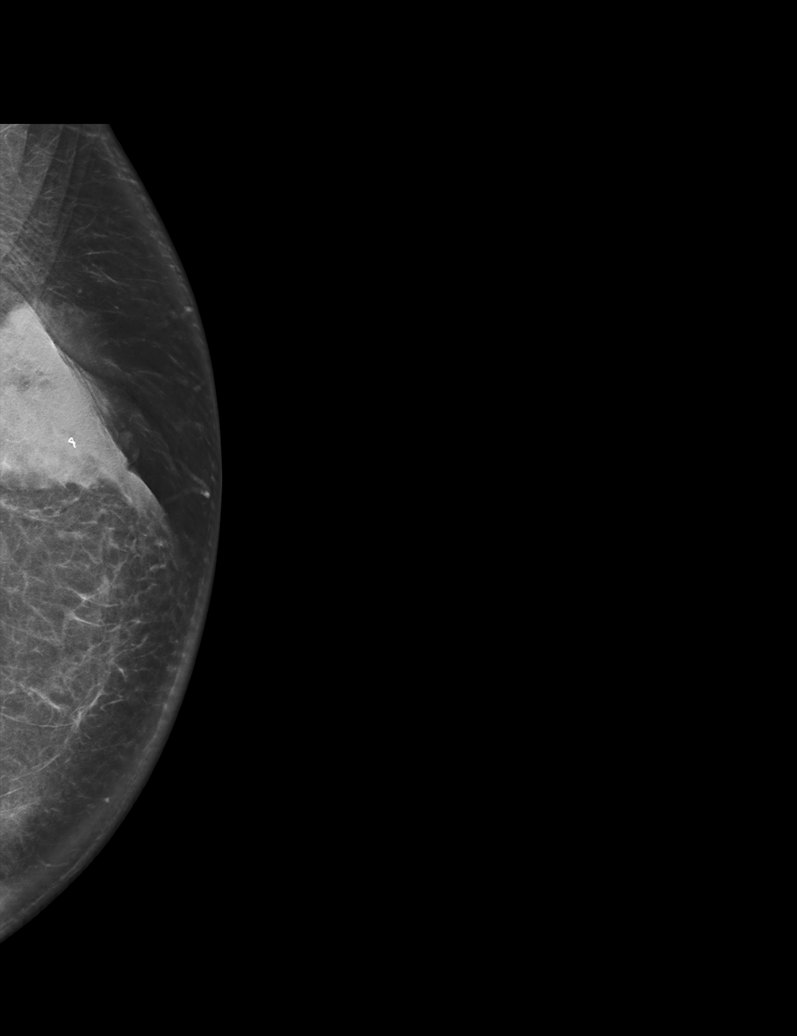

[L MLO synth-2D]
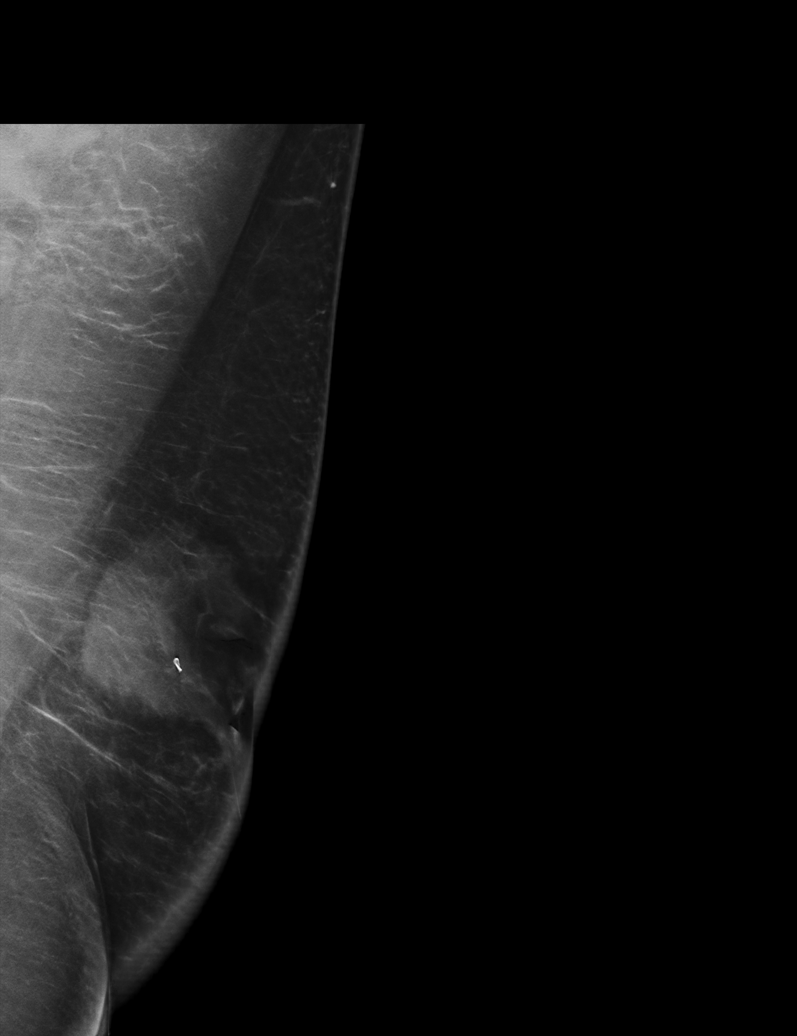

[L ML tomo · tomo slice 49/96.0]
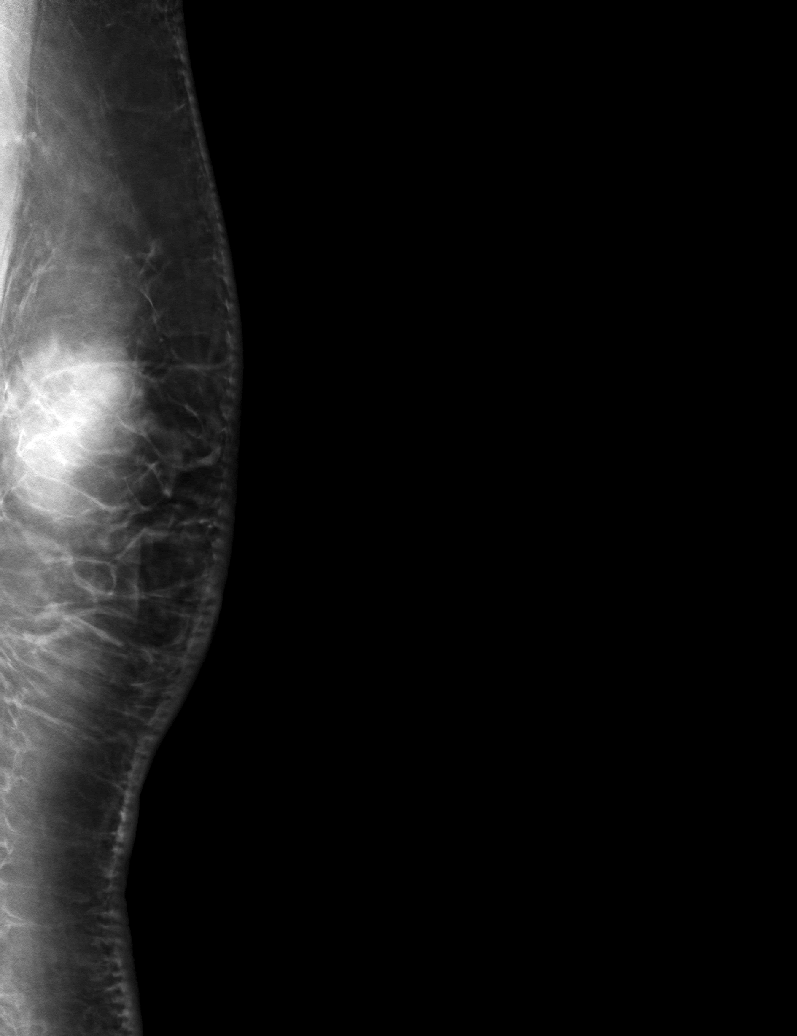

[L CC tomo · tomo slice 44/87.0]
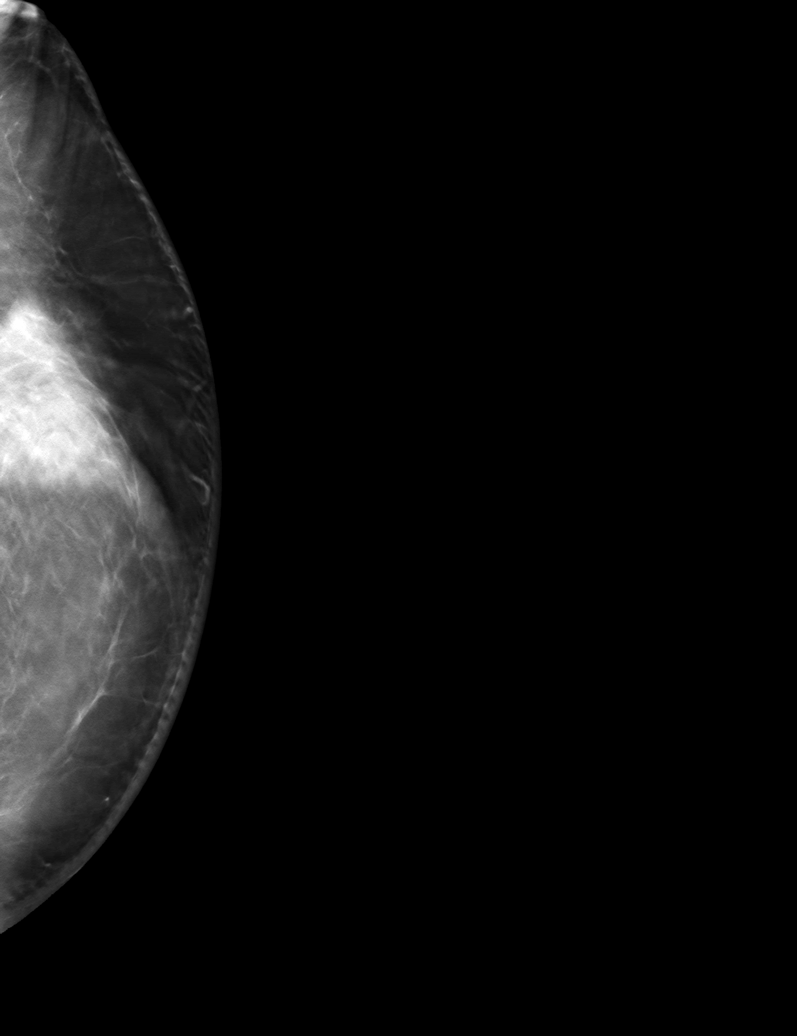

[L MLO tomo · tomo slice 57/113.0]
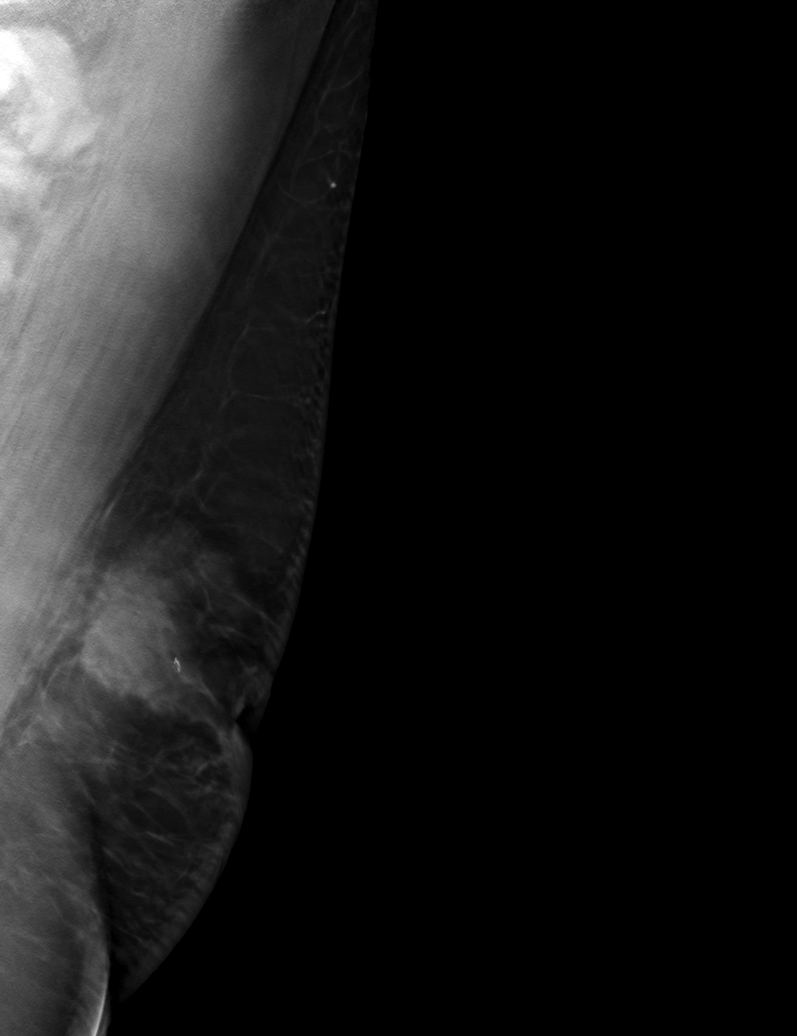

[6 of 18 positions shown; findings below may reference images not displayed]

FINDINGS: 3D Mammographic images were obtained following ultrasound guided
biopsy of left breast mass and left axillary lymph node. The biopsy
marking clips are in expected position at the site of biopsy.

Site 1: Left breast mass 1 o'clock position: Ribbon shaped clip: In
appropriate position.

Site 2: Left axillary lymph node: HydroMARK clip: In appropriate
position.
IMPRESSION: Appropriate positioning of biopsy marking clips at the site of
biopsy in the left breast and left axilla.

Final Assessment: Post Procedure Mammograms for Marker Placement

## 2021-09-15 IMAGING — US US BREAST BX W LOC DEV 1ST LESION IMG BX SPEC US GUIDE*L*
1 series · 12 of 16 positions shown · non-contrast
Comparison: Previous exam(s).
COMPARISON: Previous exam(s).

Addendum:
CLINICAL DATA: Patient with suspicious left breast mass and left
axillary lymph node.

EXAM:
ULTRASOUND GUIDED LEFT BREAST CORE NEEDLE BIOPSY

[Series 1: us breast bx w loc dev 1st lesion img bx spec us g · 0.07mm/px · 12 of 16 slices shown]
[im 1/16]
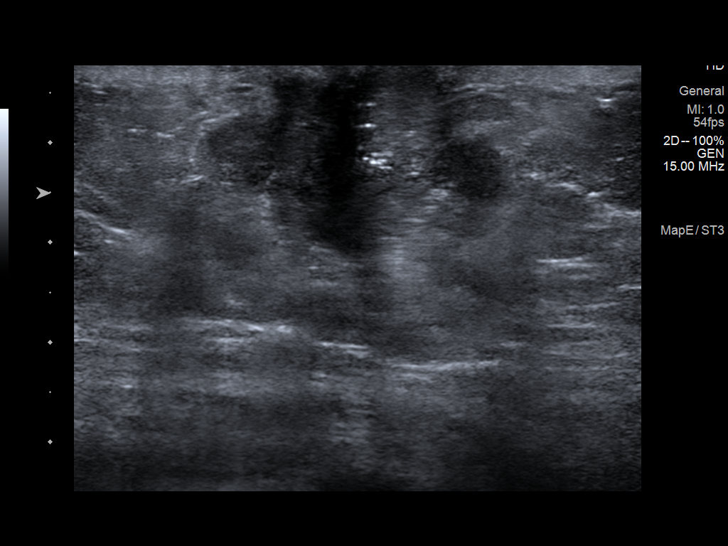
[im 3/16]
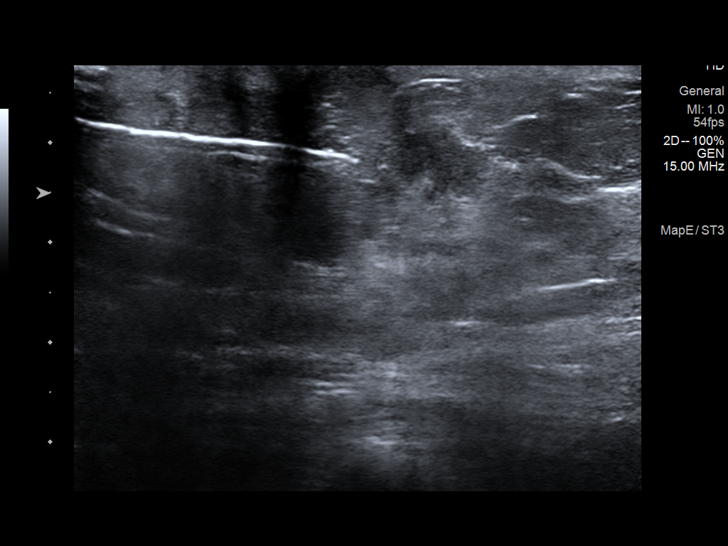
[im 4/16]
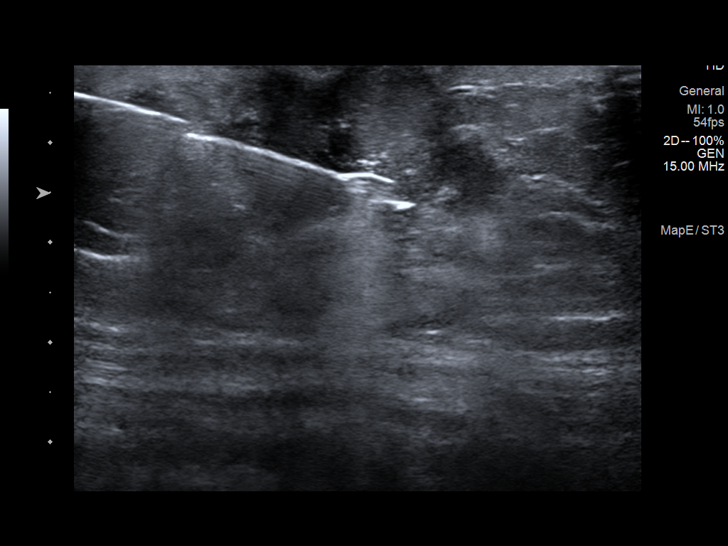
[im 5/16]
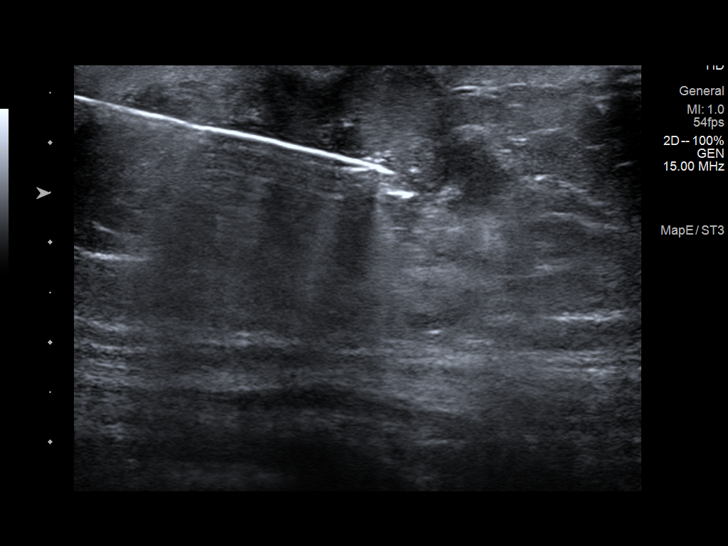
[im 7/16]
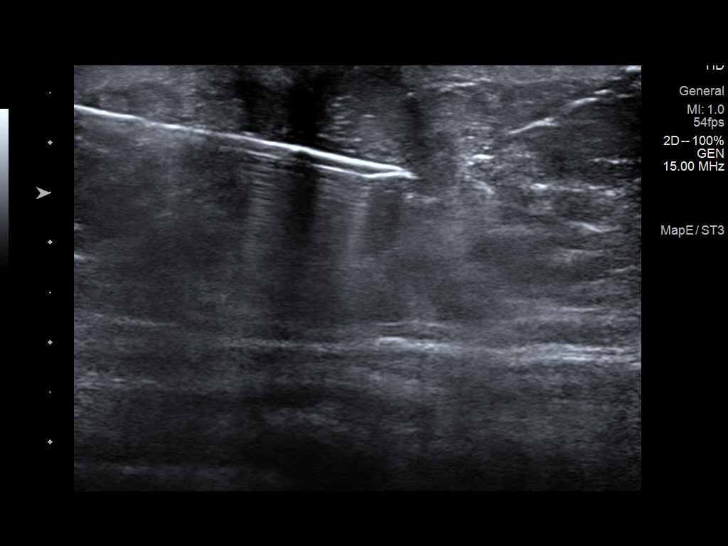
[im 8/16]
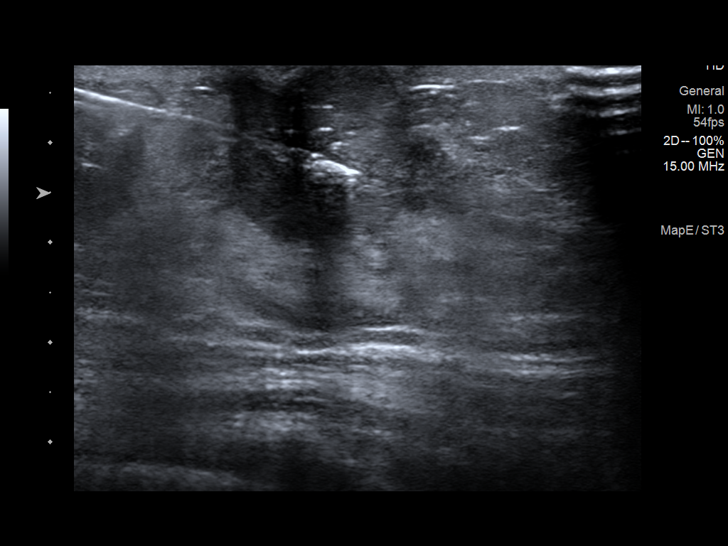
[im 9/16]
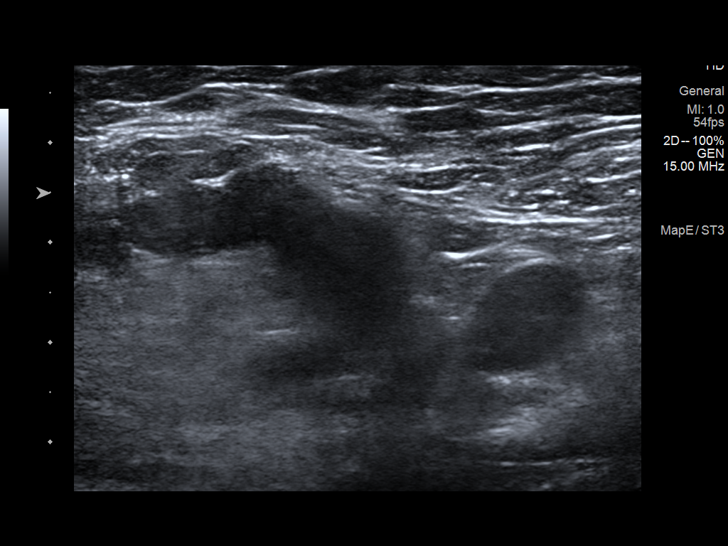
[im 11/16]
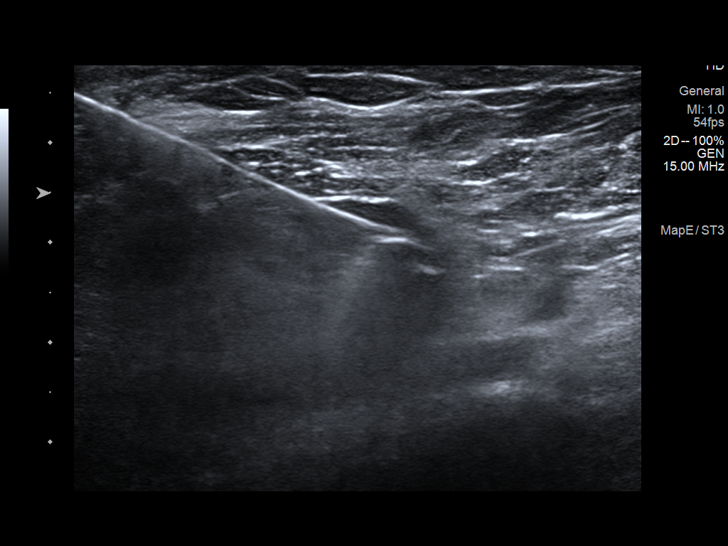
[im 12/16]
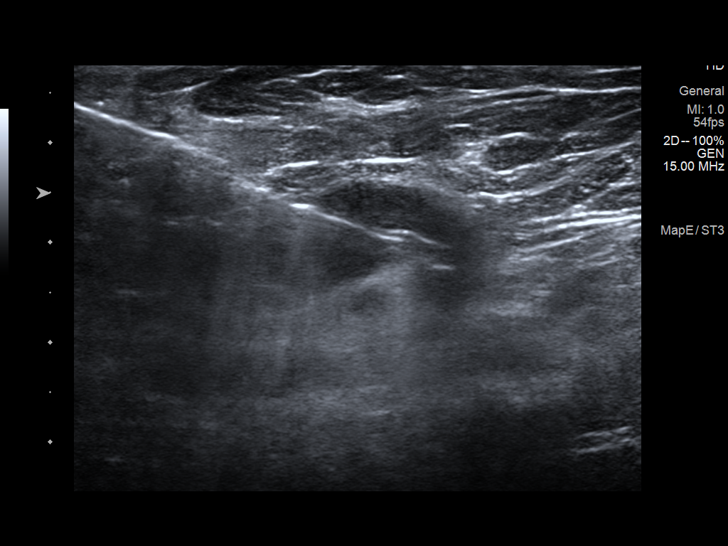
[im 13/16]
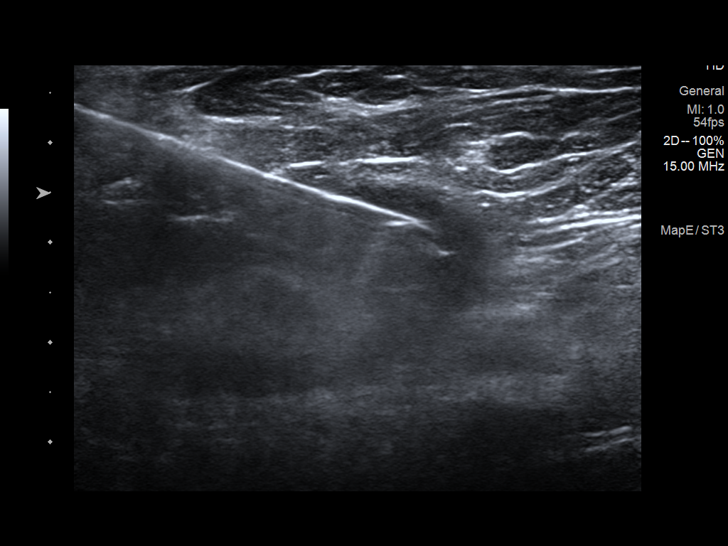
[im 15/16]
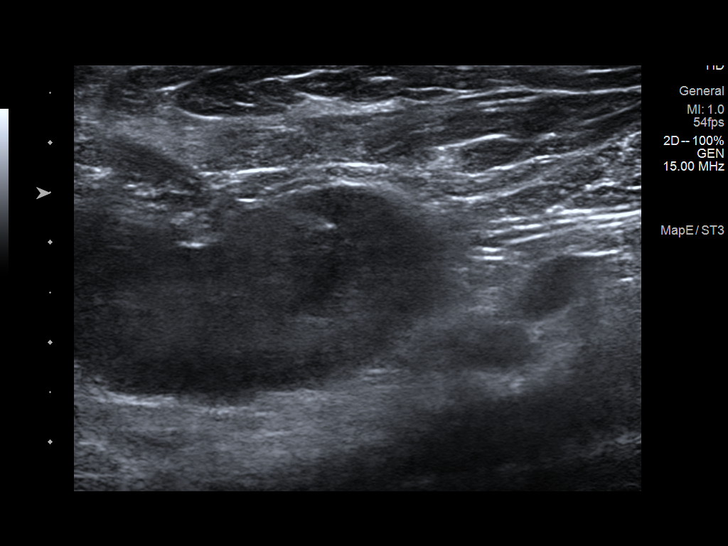
[im 16/16]
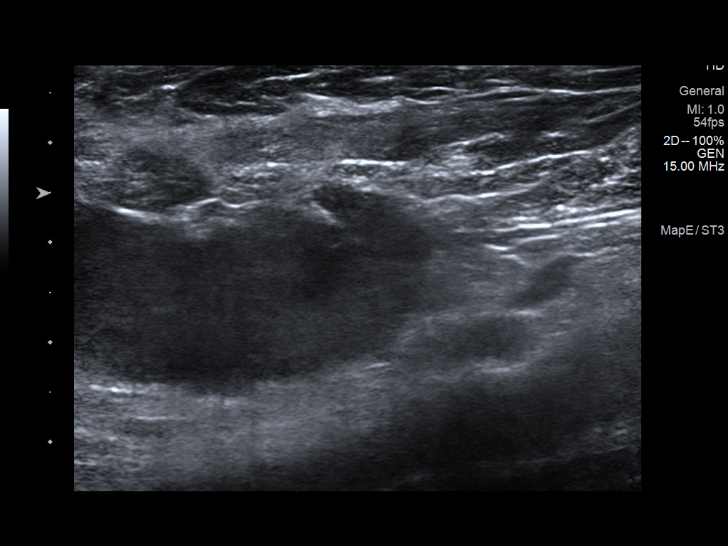

[12 of 16 positions shown; findings below may reference images not displayed]



Site 1: Left breast mass 1 o'clock position

Lesion quadrant: Upper outer quadrant

Using sterile technique and 1% Lidocaine as local anesthetic, under
direct ultrasound visualization, a 14 gauge MORONTA device was
used to perform biopsy of left breast mass 1 o'clock position using
a lateral approach. At the conclusion of the procedure ribbon tissue
marker clip was deployed into the biopsy cavity. Follow up 2 view
mammogram was performed and dictated separately.

Site 2: Left axillary lymph node

Using sterile technique and 1% Lidocaine as local anesthetic, under
direct ultrasound visualization, a 14 gauge MORONTA device was
used to perform biopsy of left axillary lymph node using a lateral
approach. At the conclusion of the procedure HydroMARK tissue marker
clip was deployed into the biopsy cavity. Follow up 2 view mammogram
was performed and dictated separately.
IMPRESSION: Ultrasound guided biopsy of left breast mass and left axillary lymph
node. No apparent complications.

ADDENDUM:
Pathology revealed GRADE II INVASIVE DUCTAL CARCINOMA of the LEFT
breast 1 o'clock, (ribbon clip). This was found to be concordant by
Dr. MORONTA.

Pathology revealed INVASIVE DUCTAL CARCINOMA, NO NODAL TISSUE
IDENTIFIED of the Left axilla, (hydromark clip). This was found to
be concordant by Dr. MORONTA.

Pathology results were discussed with the patient and his mother,
MORONTA, by telephone. The patient reported doing well after the
biopsies with tenderness at the sites. Post biopsy instructions and
care were reviewed and questions were answered. The patient was
encouraged to call The [REDACTED] for any
additional concerns. My direct phone number was provided.

Medical oncology consultation has been arranged with Dr. MORONTA
MORONTA at [HOSPITAL] [HOSPITAL] on [DATE].

The patient requested a surgical referral be arranged after the
medical oncology consultation with Dr. MORONTA.

Pathology results reported by MORONTA, RN on [DATE].



Site 1: Left breast mass 1 o'clock position

Lesion quadrant: Upper outer quadrant

Using sterile technique and 1% Lidocaine as local anesthetic, under
direct ultrasound visualization, a 14 gauge MORONTA device was
used to perform biopsy of left breast mass 1 o'clock position using
a lateral approach. At the conclusion of the procedure ribbon tissue
marker clip was deployed into the biopsy cavity. Follow up 2 view
mammogram was performed and dictated separately.

Site 2: Left axillary lymph node

Using sterile technique and 1% Lidocaine as local anesthetic, under
direct ultrasound visualization, a 14 gauge MORONTA device was
used to perform biopsy of left axillary lymph node using a lateral
approach. At the conclusion of the procedure HydroMARK tissue marker
clip was deployed into the biopsy cavity. Follow up 2 view mammogram
was performed and dictated separately.
IMPRESSION: Ultrasound guided biopsy of left breast mass and left axillary lymph
node. No apparent complications.

## 2021-09-18 ENCOUNTER — Other Ambulatory Visit: Payer: BC Managed Care – PPO

## 2021-09-20 ENCOUNTER — Ambulatory Visit (HOSPITAL_COMMUNITY)
Admission: RE | Admit: 2021-09-20 | Discharge: 2021-09-20 | Disposition: A | Discharge status: Home / Self Care | Payer: BC Managed Care – PPO | Source: Ambulatory Visit | Attending: Internal Medicine | Admitting: Internal Medicine

## 2021-09-20 ENCOUNTER — Encounter (HOSPITAL_COMMUNITY): Payer: Self-pay

## 2021-09-20 ENCOUNTER — Other Ambulatory Visit: Payer: Self-pay

## 2021-09-20 ENCOUNTER — Telehealth (HOSPITAL_COMMUNITY): Payer: Self-pay

## 2021-09-20 DIAGNOSIS — I5022 Chronic systolic (congestive) heart failure: Secondary | ICD-10-CM | POA: Diagnosis not present

## 2021-09-20 LAB — BASIC METABOLIC PANEL
Anion gap: 9 (ref 5–15)
BUN: 13 mg/dL (ref 6–20)
CO2: 26 mmol/L (ref 22–32)
Calcium: 9.5 mg/dL (ref 8.9–10.3)
Chloride: 105 mmol/L (ref 98–111)
Creatinine, Ser: 0.98 mg/dL (ref 0.61–1.24)
GFR, Estimated: >60 mL/min (ref >60)
Glucose, Bld: 103 mg/dL — ABNORMAL HIGH (ref 70–99)
Potassium: 5 mmol/L (ref 3.5–5.1)
Sodium: 140 mmol/L (ref 135–145)

## 2021-09-20 NOTE — Progress Notes (Signed)
[facility] ?CONSULT NOTE ? ?Patient Care Team: ?Pcp, No as PCP - General ?Mauro Kaufmann, RN as Oncology Nurse Navigator ?Rockwell Germany, RN as Oncology Nurse Navigator ?Elease Hashimoto (Neurology) ? ?CHIEF COMPLAINTS/PURPOSE OF CONSULTATION:  ?Newly diagnosed breast cancer. Left breast invasive ductal carcinoma ? ?HISTORY OF PRESENTING ILLNESS:  ?Michael Stuart 41 y.o. male is here because of recent diagnosis of left breast invasive ductal carcinoma.  Patient palpated a left breast mass at 1 o clock for approximately 1 year.  He had a severe upper respiratory infection and subsequently he was not feeling well.  He was diagnosed with acute congestive heart failure with an EF of 22%.  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  He was discharged home and then suddenly felt numbness on his face and on further evaluation was found to have a stroke.  Subsequent to all this he underwent full evaluation for the breast lump.  CT scans done in the emergency room on 08/27/2021 revealed 3.8 cm left breast mass with enlarged left axillary lymph nodes.  Mammogram confirmed that the mass to be 4 cm with 5 enlarged lymph nodes.  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  he presents to the clinic today for treatment plan. ? ?I reviewed her records extensively and collaborated the history with the patient. ? 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 Palpable left breast mass x8 months. CT 08/27/2021 in ED showed 3.8 ?centimeter left breast mass with left axillary lymph nodes.Mamm and [place]: 4 cm mass with 5 axillary lymph nodes: Biopsy: Grade 2 IDC, lymph node:  Positive, ER 100%, PR 50%, Ki-67 25%, HER2 negative ratio 1.25, copy #3.45 ?  ?09/25/2021 Cancer Staging  ? Staging form: Breast, AJCC 8th Edition ?- Clinical: Stage IIA (cT2, cN1, cM0, G2, ER+, PR+, HER2-) - Signed by Nicholas Lose, MD on 09/25/2021 ?Histologic grading system: 3 grade system ? ?  ?10/03/2021 -  Chemotherapy  ? Patient is on Treatment Plan : BREAST TC q21d  ?   ? ? ? ?MEDICAL HISTORY:  ?Past Medical History:  ?Diagnosis Date  ? CHF (congestive heart failure) ([place])   ? ? ?SURGICAL HISTORY: ?Past Surgical History:  ?Procedure Laterality Date  ? RIGHT/LEFT HEART CATH AND CORONARY ANGIOGRAPHY N/A 08/28/2021  ? Procedure: RIGHT/LEFT HEART CATH AND CORONARY ANGIOGRAPHY;  Surgeon: Nigel Mormon, MD;  Location: [facility];  Service: Cardiovascular;  Laterality: N/A;  ? ? ?SOCIAL HISTORY: ?Social History  ? ?Socioeconomic History  ? Marital status: Married  ?  Spouse name: Not on file  ? Number of children: 2  ? Years of education: Not on file  ? Highest education level: Bachelor's degree (e.g., BA, AB, BS)  ?Occupational History  ? Occupation: crash Insurance underwriter  ?  Employer: [employer]  ?Tobacco Use  ? Smoking status: Every Day  ?  Packs/day: 0.25  ?  Years: 25.00  ?  Pack years: 6.25  ?  Types: Cigarettes  ? Smokeless tobacco: Current  ? Tobacco comments:  ?  Smoked 2PPD x 10 years. Last 13 years 3-4 cigarettes/day.   ?Vaping Use  ? Vaping Use: Former  ?  Substances: Nicotine  ?Substance and Sexual Activity  ? Alcohol use: Yes  ?  Alcohol/week: 21.0 standard drinks  ?  Types: 21 Cans of beer per week  ?  Comment: 2 tall boys per day, everyday x 20years  ? Drug use: Not Currently  ?  Frequency: 1.0 times per week  ?  Types: Marijuana  ?  Comment: 3x per month  ? Sexual activity: Not Currently  ?Other Topics Concern  ? Not on file  ?Social History Narrative  ? Right handed   ? ?Social Determinants of Health  ? ?Financial Resource Strain: Low Risk   ? Difficulty of Paying Living Expenses: Not very hard   ?Food Insecurity: No Food Insecurity  ? Worried About Charity fundraiser in the Last Year: Never true  ? Ran Out of Food in the Last Year: Never true  ?Transportation Needs: No Transportation Needs  ? Lack of Transportation (Medical): No  ? Lack of Transportation (Non-Medical): No  ?Physical Activity: Not on file  ?Stress: Not on file  ?Social Connections: Not on file  ?Intimate Partner Violence: Not on file  ? ? ?FAMILY HISTORY: ?Family History  ?Problem Relation Age of Onset  ? Healthy Mother   ? ? ?ALLERGIES:  is allergic to bactrim [sulfamethoxazole-trimethoprim]. ? ?MEDICATIONS:  ?Current Outpatient Medications  ?Medication Sig Dispense Refill  ? acetaminophen (TYLENOL) 500 MG tablet Take 1,000 mg by mouth every 6 (six) hours as needed for moderate pain.    ? albuterol (VENTOLIN HFA) 108 (90 Base) MCG/ACT inhaler Inhale 1-2 puffs into the lungs every 6 (six) hours as needed for wheezing or shortness of breath. 1 each 0  ? antiseptic oral rinse (BIOTENE) LIQD 15 mLs by Mouth Rinse route as needed for dry mouth.    ? Apixaban (ELIQUIS PO) Take 5 mg by mouth 2 (two) times daily.    ? atorvastatin (LIPITOR) 20 MG tablet Take 1 tablet (20 mg total) by mouth daily. 30 tablet 0  ? dapagliflozin propanediol (FARXIGA) 10 MG TABS tablet Take 1 tablet (10 mg total) by mouth daily. 30 tablet 2  ? dexamethasone (DECADRON) 4 MG tablet Take 1 tablet (4 mg total) by mouth daily. Take 1 tablet day before chemo and 1 tablet day after chemo with food 12 tablet 0  ? digoxin (LANOXIN) 0.125 MG tablet Take 1 tablet (0.125 mg total) by mouth daily. 30 tablet 2  ? eplerenone (INSPRA) 25 MG tablet Take 2 tablets (50 mg total) by mouth daily. 60 tablet 2  ? ivabradine (CORLANOR) 5 MG TABS tablet Take 1 tablet (5 mg total) by mouth 2 (two) times daily with a meal. 60 tablet 2  ? lidocaine-prilocaine (EMLA) cream Apply to affected area once 30 g 3  ? LORazepam (ATIVAN) 0.5 MG tablet Take 1 tablet (0.5 mg total) by mouth every 6 (six)  hours as needed (Nausea or vomiting). 30 tablet 0  ? metoprolol succinate (TOPROL-XL) 25 MG 24 hr tablet Take 1 tablet (25 mg total) by mouth daily. 30 tablet 2  ? ondansetron (ZOFRAN) 8 MG tablet Take 1 tablet (8 mg total) by mouth 2 (two) times daily as needed for refractory nausea / vomiting. Start on day 3 after chemo. 30 tablet 1  ? prochlorperazine (COMPAZINE) 10 MG tablet Take 1 tablet (10 mg total) by mouth every 6 (six) hours as needed (Nausea or vomiting). 30 tablet 1  ? sacubitril-valsartan (ENTRESTO) 24-26 MG Take 1 tablet by mouth 2 (two) times daily. 60 tablet  2  ? ?No current facility-administered medications for this visit.  ? ? ?REVIEW OF SYSTEMS:   ?Constitutional: Denies fevers, chills or abnormal night sweats ?All other systems were reviewed with the patient and are negative. ? ?PHYSICAL EXAMINATION: ?ECOG PERFORMANCE STATUS: 1 - Symptomatic but completely ambulatory ? ?Vitals:  ? 09/25/21 1251  ?BP: 133/71  ?Pulse: 94  ?Resp: 19  ?Temp: (!) 97.5 ?F (36.4 ?C)  ?SpO2: 100%  ? ?Filed Weights  ? 09/25/21 1251  ?Weight: 256 lb 3.2 oz (116.2 kg)  ?  ? ?LABORATORY DATA:  ?I have reviewed the data as listed ?Lab Results  ?Component Value Date  ? WBC 8.5 09/13/2021  ? HGB 17.9 (H) 09/13/2021  ? HCT 55.1 (H) 09/13/2021  ? MCV 92.9 09/13/2021  ? PLT 281 09/13/2021  ? ?Lab Results  ?Component Value Date  ? NA 140 09/20/2021  ? K 5.0 09/20/2021  ? CL 105 09/20/2021  ? CO2 26 09/20/2021  ? ? ?RADIOGRAPHIC STUDIES: ?I have personally reviewed the radiological reports and agreed with the findings in the report. ? 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 CT 08/27/2021 in ED showed 3.8 cm left breast mass with left axillary lymph nodes.Mamm and [place]: 4 cm mass with 5 axillary lymph nodes: Biopsy: Grade 2 IDC, lymph node: Positive, ER 100%, PR 50%, Ki-67 25%, HER2 negative ratio 1.25,  copy #3.45 ? ?Pathology and radiology counseling: Discussed with the patient, the details of pathology including the type of breast cancer,the clinical staging, the significance of ER, PR and HER-2/neu

## 2021-09-20 NOTE — Progress Notes (Unsigned)
Medication Samples have been provided to the patient. ? ?Drug name: Eliquis       Strength: 5 mg        Qty: 2  LOT: [id number]  Exp.Date: 09/2023 ? ?Dosing instructions: Take 1 tablet Twice daily ? ? ?The patient has been instructed regarding the correct time, dose, and frequency of taking this medication, including desired effects and most common side effects.  ? ?Juanita Laster Denzal Meir ?10:13 AM ?09/20/2021 ? ?

## 2021-09-20 NOTE — Telephone Encounter (Addendum)
Pt aware, agreeable, and verbalized understanding ? ? ?----- Message from Rafael Bihari, FNP sent at 09/20/2021 11:40 AM EDT ----- ?Potassium improved. Continue eplerenone 25 mg daily dose ?

## 2021-09-21 ENCOUNTER — Ambulatory Visit: Payer: BC Managed Care – PPO | Admitting: Physician Assistant

## 2021-09-21 ENCOUNTER — Encounter: Payer: Self-pay | Admitting: Physician Assistant

## 2021-09-21 VITALS — BP 135/88 | HR 76 | Ht 75.0 in | Wt 261.4 lb

## 2021-09-21 DIAGNOSIS — I6340 Cerebral infarction due to embolism of unspecified cerebral artery: Secondary | ICD-10-CM | POA: Diagnosis not present

## 2021-09-21 NOTE — Progress Notes (Signed)
?[facility] ?Neurology Division ?Clinic Note - Initial Visit ? ? ?Date: 09/21/21 ? ?Michael Stuart ?MRN: [id number] ?DOB: 09/09/1980 ? ? ?Dear Dr .Patwardhan: ? ?Thank you for your kind referral of Michael Stuart for consultation of stroke. Although his history is well known to you, please allow us to reiterate it for the purpose of our medical record. The patient was accompanied to the clinic by his mother who also provides collateral information.  ?  ? ?History of Present Illness: ?Michael Stuart is a 40 y.o. R-handed male with a history of systolic heart failure with LVEF 20% with atypical left ventricle thrombosis, recently hospitalized from 2/19 to 08/31/2021 for heart failure exacerbation, and newly diagnosed left ventricular apical thrombus.  He was discharged on Lovenox bridging and warfarin with a discharge INR of 1.2 .  He also has a diagnosis of left breast cancer with axillary and mediastinal lymphadenopathy, to start chemotherapy soon.  At home, the patient had acute onset of left sided facial numbness "with pins-and-needles ", and left facial droop with left vision changes, described as "the problem is in the left eye, but I can see some sort of image on the right (rather than left) static on the TV, then changing to a teardrop, then to some sort of a snake skin and eventually resolving ".  He presented again to the ED.  His vitals and exam at the ER were unremarkable.  His head CT was negative for acute changes, and so was his EKG.  However, his brain MRI showed patchy acute and subacute infarcts, as well as cortical and subcortical left greater than right occipital lobes and left posterior temporal lobe with involvement of the superior medial right thalamus, posterior left ventricular white matter, and right frontal lobe with reduced diffusion.  He had IV heparin, neurology was consulted, diagnosed with CVA.  His INR was under therapeutic at 1.5.  Cardiology considered oral anticoagulation, warfarin  was initially indicated, but this was changed to Eliquis per their recommendations.  He is to follow-up with cardiology as an outpatient. ?  He also does report a recent trip in January 2014 1000 miles for work to the [place] and [place]. He was not on ASA at the time.  Denies having a prior history of a stroke or TIA.  He denies vertigo, or dizziness.  He denies any headaches, dysarthria or dysphagia, confusion or seizures.  He denies any chest pain or shortness of breath at this time, fever or chills or night sweats.  He has a history of smoking, denies any hormonal supplements.  He does drink 2 or 3 large beer cans a day, and he is trying to cut down as he used to drink up to 5 tall cans of beer a day.  He is undergoing significant amount of stress due to recent medical issues including breast cancer, as well and is going through separation from his wife, and financial strains due to recent medical events, which are very costly.  No recent surgeries.  He denies any sick contacts.  He is overall active, he works at [employer] he is a mechanical injury near.  He is not a diabetic.  Family history remarkable for maternal grandparents with stroke. ? ?Out-side paper records, electronic medical record, and images have been reviewed where available and summarized as: ? ?EKG 09/04/2021: ?Sinus rhythm 90 bpm  ?Biatrial enlargement ?Diffuse nonspecific T-abnormality ?  ?MRI brain 09/01/2021: ?Patchy acute and subacute infarcts as described. Largest area of involvement   is in the left frontal lobe corresponding to abnormality on CT where there is some petechial hemorrhage. Involvement of multiple vascular territories suggesting a central source. No intracranial mass. ?  ?LHC/RHC 08/28/2021: ?Normal coronary arteries without coronary artery disease ?  ?RA: 10 mmHg ?RV: 49/4 mmHg ?PA: 44/24 mmHg, mPAP 34 mmHg ?PCW: 22 mmHg ?  ?CO: 5.7 L/min ?CI: 2.3 L/min/m2 ?  ?Decompensated nonischemic cardiomyopathy ?Resume IV heparin 2 hours  after TR band is off given LV apical thrombus ?GDMT for heart failure and continued workup for breast mass ?  ?Echocardiogram 08/27/2021: ?1. Left ventricular ejection fraction, by estimation, is 20 to 25%. The left ventricle has severely decreased function. The left ventricle demonstrates global hypokinesis. The left ventricular internal cavity size was dilated. Left ventricular diastolic  parameters are consistent with Grade III diastolic dysfunction (restrictive). Elevated left ventricular end-diastolic pressure.  2. Right ventricular systolic function is hyperdynamic. The right ventricular size is mildly enlarged. dilated.  3. Left atrial size was mild to moderately dilated.  4. Right atrial size was dilated.  5. A small pericardial effusion is present. The pericardial effusion is posterior to the left ventricle.  6. The mitral valve is grossly normal. Mild mitral valve regurgitation. No evidence of mitral stenosis.  7. The aortic valve is tricuspid. Aortic valve regurgitation is not visualized. No aortic stenosis is present.  8. There is mild dilatation of the ascending aorta, measuring 40 mm.  9. The inferior vena cava is dilated in size with >50% respiratory variability, suggesting right atrial pressure of 8 mmHg.  ?  ?Recent labs: ?09/02/2021: ?Glucose 134, BUN/Cr 12/0.96. EGFR >60. Na/K 137/4.9. Albumin 3.4. ALT 46. Rest of the CMP normal ?H/H 16/48. MCV 91 . Platelets 232 ?HbA1C 5.6% ?Chol 141, TG 109, HDL 40, LDL 79 ?TSH 0.3 normal ? ?Lab Results  ?Component Value Date  ? HGBA1C 5.6 08/27/2021  ? ?No results found for: VITAMINB12 ?Lab Results  ?Component Value Date  ? TSH 0.389 08/27/2021  ? ?No results found for: ESRSEDRATE, POCTSEDRATE ? ?Past Medical History:  ?Diagnosis Date  ? CHF (congestive heart failure) ([facility])   ? ? ?Past Surgical History:  ?Procedure Laterality Date  ? RIGHT/LEFT HEART CATH AND CORONARY ANGIOGRAPHY N/A 08/28/2021  ? Procedure: RIGHT/LEFT HEART CATH AND CORONARY ANGIOGRAPHY;   Surgeon: Patwardhan, Manish J, MD;  Location: [facility];  Service: Cardiovascular;  Laterality: N/A;  ? ? ? ?Medications:  ?Outpatient Encounter Medications as of 09/21/2021  ?Medication Sig  ? acetaminophen (TYLENOL) 500 MG tablet Take 1,000 mg by mouth every 6 (six) hours as needed for moderate pain.  ? albuterol (VENTOLIN HFA) 108 (90 Base) MCG/ACT inhaler Inhale 1-2 puffs into the lungs every 6 (six) hours as needed for wheezing or shortness of breath.  ? antiseptic oral rinse (BIOTENE) LIQD 15 mLs by Mouth Rinse route as needed for dry mouth.  ? Apixaban (ELIQUIS PO) Take 5 mg by mouth 2 (two) times daily.  ? atorvastatin (LIPITOR) 20 MG tablet Take 1 tablet (20 mg total) by mouth daily.  ? dapagliflozin propanediol (FARXIGA) 10 MG TABS tablet Take 1 tablet (10 mg total) by mouth daily.  ? digoxin (LANOXIN) 0.125 MG tablet Take 1 tablet (0.125 mg total) by mouth daily.  ? eplerenone (INSPRA) 25 MG tablet Take 2 tablets (50 mg total) by mouth daily.  ? ivabradine (CORLANOR) 5 MG TABS tablet Take 1 tablet (5 mg total) by mouth 2 (two) times daily with a meal.  ? metoprolol   succinate (TOPROL-XL) 25 MG 24 hr tablet Take 1 tablet (25 mg total) by mouth daily.  ? PROMETHAZINE HCL PO Take 1 capsule by mouth as needed (sleep).  ? sacubitril-valsartan (ENTRESTO) 24-26 MG Take 1 tablet by mouth 2 (two) times daily.  ? ?No facility-administered encounter medications on file as of 09/21/2021.  ? ? ?Allergies:  ?Allergies  ?Allergen Reactions  ? Bactrim [Sulfamethoxazole-Trimethoprim] Other (See Comments)  ?  Blood Capillaries Rupture   ? ? ?Family History: ?Family History  ?Problem Relation Age of Onset  ? Healthy Mother   ? ? ?Social History: ?Social History  ? ?Tobacco Use  ? Smoking status: Every Day  ?  Packs/day: 0.25  ?  Years: 25.00  ?  Pack years: 6.25  ?  Types: Cigarettes  ? Smokeless tobacco: Current  ? Tobacco comments:  ?  Smoked 2PPD x 10 years. Last 13 years 3-4 cigarettes/day.   ?Vaping Use  ? Vaping  Use: Former  ? Substances: Nicotine  ?Substance Use Topics  ? Alcohol use: Yes  ?  Alcohol/week: 21.0 standard drinks  ?  Types: 21 Cans of beer per week  ?  Comment: 2 tall boys per day, everyday x 20years  ?

## 2021-09-21 NOTE — Patient Instructions (Addendum)
Great to meet you  ? ?Recommend CTA of the head and neck for completion  ?Follow up in 3 months  ?

## 2021-09-25 ENCOUNTER — Telehealth (HOSPITAL_COMMUNITY): Payer: Self-pay

## 2021-09-25 ENCOUNTER — Inpatient Hospital Stay: Payer: BC Managed Care – PPO | Attending: Hematology and Oncology | Admitting: Hematology and Oncology

## 2021-09-25 ENCOUNTER — Encounter: Payer: Self-pay | Admitting: *Deleted

## 2021-09-25 ENCOUNTER — Other Ambulatory Visit: Payer: Self-pay | Admitting: *Deleted

## 2021-09-25 ENCOUNTER — Other Ambulatory Visit: Payer: Self-pay

## 2021-09-25 VITALS — BP 133/71 | HR 94 | Temp 97.5°F | Resp 19 | Ht 75.0 in | Wt 256.2 lb

## 2021-09-25 DIAGNOSIS — Z170 Estrogen receptor positive status [ER+]: Secondary | ICD-10-CM | POA: Diagnosis not present

## 2021-09-25 DIAGNOSIS — Z5189 Encounter for other specified aftercare: Secondary | ICD-10-CM | POA: Diagnosis not present

## 2021-09-25 DIAGNOSIS — F17210 Nicotine dependence, cigarettes, uncomplicated: Secondary | ICD-10-CM | POA: Diagnosis not present

## 2021-09-25 DIAGNOSIS — Z79899 Other long term (current) drug therapy: Secondary | ICD-10-CM | POA: Insufficient documentation

## 2021-09-25 DIAGNOSIS — Z7901 Long term (current) use of anticoagulants: Secondary | ICD-10-CM | POA: Insufficient documentation

## 2021-09-25 DIAGNOSIS — Z7984 Long term (current) use of oral hypoglycemic drugs: Secondary | ICD-10-CM | POA: Insufficient documentation

## 2021-09-25 DIAGNOSIS — I509 Heart failure, unspecified: Secondary | ICD-10-CM | POA: Insufficient documentation

## 2021-09-25 DIAGNOSIS — Z5111 Encounter for antineoplastic chemotherapy: Secondary | ICD-10-CM | POA: Insufficient documentation

## 2021-09-25 DIAGNOSIS — C50422 Malignant neoplasm of upper-outer quadrant of left male breast: Secondary | ICD-10-CM | POA: Diagnosis not present

## 2021-09-25 DIAGNOSIS — Z7952 Long term (current) use of systemic steroids: Secondary | ICD-10-CM | POA: Diagnosis not present

## 2021-09-25 DIAGNOSIS — C773 Secondary and unspecified malignant neoplasm of axilla and upper limb lymph nodes: Secondary | ICD-10-CM | POA: Diagnosis not present

## 2021-09-25 MED ORDER — PROCHLORPERAZINE MALEATE 10 MG PO TABS
10.0000 mg | ORAL_TABLET | Freq: Four times a day (QID) | ORAL | 0 refills | Status: DC | PRN
  Filled 2021-09-27: qty 30, 8d supply, fill #0

## 2021-09-25 MED ORDER — DEXAMETHASONE 4 MG PO TABS: 4.0000 mg | ORAL_TABLET | Freq: Every day | ORAL | 0 refills | Status: DC

## 2021-09-25 MED ORDER — ONDANSETRON HCL 8 MG PO TABS
8.0000 mg | ORAL_TABLET | Freq: Two times a day (BID) | ORAL | 0 refills | Status: DC | PRN
  Filled 2021-09-27: qty 9, 4d supply, fill #0

## 2021-09-25 MED ORDER — LORAZEPAM 0.5 MG PO TABS: 0.5000 mg | ORAL_TABLET | Freq: Four times a day (QID) | ORAL | 0 refills | Status: DC | PRN

## 2021-09-25 MED ORDER — LIDOCAINE-PRILOCAINE 2.5-2.5 % EX CREA
TOPICAL_CREAM | CUTANEOUS | 2 refills | Status: DC
  Filled 2021-09-27: qty 30, 30d supply, fill #0

## 2021-09-25 NOTE — Progress Notes (Signed)
START OFF PATHWAY REGIMEN - Breast ? ? ?OFF00004:Docetaxel + Cyclophosphamide (TC): ?  A cycle is every 21 days: ?    Docetaxel  ?    Cyclophosphamide  ? ?**Always confirm dose/schedule in your pharmacy ordering system** ? ?Patient Characteristics: ?Preoperative or Nonsurgical Candidate (Clinical Staging), Neoadjuvant Therapy followed by Surgery, Invasive Disease, Chemotherapy, HER2 Negative/Unknown/Equivocal, ER Positive ?Therapeutic Status: Preoperative or Nonsurgical Candidate (Clinical Staging) ?AJCC M Category: cM0 ?AJCC Grade: G2 ?Breast Surgical Plan: Neoadjuvant Therapy followed by Surgery ?ER Status: Positive (+) ?AJCC 8 Stage Grouping: IIA ?HER2 Status: Negative (-) ?AJCC T Category: cT2 ?AJCC N Category: cN1 ?PR Status: Positive (+) ?Intent of Therapy: ?Curative Intent, Discussed with Patient ?

## 2021-09-25 NOTE — Assessment & Plan Note (Addendum)
09/15/2021:Palpable left breast mass x8 months. CT 08/27/2021 in ED showed 3.8 cm left breast mass with left axillary lymph nodes.Mamm and [place]: 4 cm mass with 5 axillary lymph nodes: Biopsy: Grade 2 IDC, lymph node: Positive, ER 100%, PR 50%, Ki-67 25%, HER2 negative ratio 1.25, copy #3.45 ? ?Pathology and radiology counseling: Discussed with the patient, the details of pathology including the type of breast cancer,the clinical staging, the significance of ER, PR and HER-2/neu receptors and the implications for treatment. After reviewing the pathology in detail, we proceeded to discuss the different treatment options between surgery, radiation, chemotherapy, antiestrogen therapies. ? ?Recommendation based on multidisciplinary tumor board: ?1. Neoadjuvant chemotherapy with Taxotere and Cytoxan every 3 weeks x6 cycles ?2. Followed by breast conserving surgery with targeted axillary dissection vs ALND ?3. Followed by adjuvant radiation therapy ?4.  Followed by adjuvant antiestrogen therapy with CDK 4 and 6 inhibitor ? ?Chemotherapy Counseling: I discussed the risks and benefits of chemotherapy including the risks of nausea/ vomiting, risk of infection from low WBC count, fatigue due to chemo or anemia, bruising or bleeding due to low platelets, mouth sores, loss/ change in taste and decreased appetite. Liver and kidney function will be monitored through out chemotherapy as abnormalities in liver and kidney function may be a side effect of treatment.  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 Risk of permanent bone marrow dysfunction due to chemo were also discussed. ? ?Plan: ?1. Port placement ?2. Chemotherapy class ?3. CT chest abdomen pelvis and bone scan for staging ?4. Genetic counseling will also be arranged ? ?  ?Return to clinic in 1 week to start chemotherapy. ?

## 2021-09-25 NOTE — Telephone Encounter (Signed)
Received a fax requesting medical records from [facility]. Records were successfully faxed to: [phone] ,which was the number provided.. Medical request form will be scanned into patients chart.  ? ?Called patient and I was given a verbal order ok to sends his records to [facility]. ? ?

## 2021-09-26 ENCOUNTER — Telehealth: Payer: Self-pay | Admitting: Cardiology

## 2021-09-26 ENCOUNTER — Telehealth: Payer: Self-pay | Admitting: *Deleted

## 2021-09-26 ENCOUNTER — Other Ambulatory Visit (HOSPITAL_COMMUNITY): Payer: Self-pay

## 2021-09-26 ENCOUNTER — Telehealth: Payer: Self-pay | Admitting: Hematology and Oncology

## 2021-09-26 ENCOUNTER — Encounter: Payer: Self-pay | Admitting: *Deleted

## 2021-09-26 ENCOUNTER — Encounter: Payer: Self-pay | Admitting: Physician Assistant

## 2021-09-26 ENCOUNTER — Telehealth: Payer: Self-pay

## 2021-09-26 NOTE — Telephone Encounter (Signed)
.  Called patient to schedule appointment per 3/21 inbasket, patient and mother bis aware of date and time.   ?

## 2021-09-26 NOTE — Telephone Encounter (Signed)
Called pt to provided navigation resources and contact information. Left vm, request return call with questions or needs. Contact information provided. ?

## 2021-09-26 NOTE — Telephone Encounter (Signed)
Patient's mother calling about patient. Says he started using [facility] since he was admitted 08/31/21. She is requesting a refill for lipitor and eliquis, the pharmacy does not currently have these. ?

## 2021-09-26 NOTE — Telephone Encounter (Signed)
I called patient about his CT angio neck and head, at this time. He can't start per him due to cancer treatments. Please advise they would like to hold this out for several months. I had him scheduled at [facility] for 10/04/2021 at 11:30. Please advise  ?

## 2021-09-26 NOTE — Telephone Encounter (Signed)
Also, please let him know that the only reason is ok to wait is because of financial hardship, as it would be really ideal to learn more about his vasculature to hopefully prevent further events. He will need it at some soint, hopefully sooner than later. Thanks

## 2021-09-26 NOTE — Progress Notes (Signed)
error 

## 2021-09-27 ENCOUNTER — Other Ambulatory Visit: Payer: Self-pay

## 2021-09-27 ENCOUNTER — Other Ambulatory Visit (HOSPITAL_COMMUNITY): Payer: Self-pay

## 2021-09-27 ENCOUNTER — Encounter: Payer: Self-pay | Admitting: Hematology and Oncology

## 2021-09-27 ENCOUNTER — Other Ambulatory Visit: Payer: Self-pay | Admitting: Student

## 2021-09-27 MED ORDER — ATORVASTATIN CALCIUM 20 MG PO TABS
20.0000 mg | ORAL_TABLET | Freq: Every day | ORAL | 3 refills | Status: DC
  Filled 2021-09-27: qty 30, 30d supply, fill #0
  Filled 2021-10-31: qty 30, 30d supply, fill #1
  Filled 2021-11-23: qty 30, 30d supply, fill #2
  Filled 2021-12-19: qty 30, 30d supply, fill #3

## 2021-09-27 MED ORDER — APIXABAN 5 MG PO TABS
5.0000 mg | ORAL_TABLET | Freq: Two times a day (BID) | ORAL | 3 refills | Status: DC
  Filled 2021-09-27: qty 60, 30d supply, fill #0

## 2021-09-27 NOTE — Telephone Encounter (Signed)
done

## 2021-09-28 ENCOUNTER — Ambulatory Visit (HOSPITAL_COMMUNITY)
Admission: RE | Admit: 2021-09-28 | Discharge: 2021-09-28 | Disposition: A | Discharge status: Home / Self Care | Payer: BC Managed Care – PPO | Source: Ambulatory Visit | Attending: Hematology and Oncology | Admitting: Hematology and Oncology

## 2021-09-28 ENCOUNTER — Other Ambulatory Visit: Payer: Self-pay

## 2021-09-28 ENCOUNTER — Encounter (HOSPITAL_COMMUNITY): Payer: Self-pay

## 2021-09-28 ENCOUNTER — Other Ambulatory Visit: Payer: Self-pay | Admitting: *Deleted

## 2021-09-28 ENCOUNTER — Encounter: Payer: Self-pay | Admitting: *Deleted

## 2021-09-28 ENCOUNTER — Other Ambulatory Visit: Payer: Self-pay | Admitting: Hematology and Oncology

## 2021-09-28 DIAGNOSIS — I429 Cardiomyopathy, unspecified: Secondary | ICD-10-CM | POA: Diagnosis not present

## 2021-09-28 DIAGNOSIS — C50412 Malignant neoplasm of upper-outer quadrant of left female breast: Secondary | ICD-10-CM

## 2021-09-28 DIAGNOSIS — I509 Heart failure, unspecified: Secondary | ICD-10-CM | POA: Diagnosis not present

## 2021-09-28 DIAGNOSIS — N200 Calculus of kidney: Secondary | ICD-10-CM | POA: Diagnosis not present

## 2021-09-28 DIAGNOSIS — Z170 Estrogen receptor positive status [ER+]: Secondary | ICD-10-CM | POA: Diagnosis not present

## 2021-09-28 DIAGNOSIS — Z7901 Long term (current) use of anticoagulants: Secondary | ICD-10-CM | POA: Insufficient documentation

## 2021-09-28 DIAGNOSIS — C50922 Malignant neoplasm of unspecified site of left male breast: Secondary | ICD-10-CM | POA: Diagnosis not present

## 2021-09-28 DIAGNOSIS — E785 Hyperlipidemia, unspecified: Secondary | ICD-10-CM | POA: Diagnosis not present

## 2021-09-28 DIAGNOSIS — Z452 Encounter for adjustment and management of vascular access device: Secondary | ICD-10-CM | POA: Diagnosis not present

## 2021-09-28 DIAGNOSIS — Z8673 Personal history of transient ischemic attack (TIA), and cerebral infarction without residual deficits: Secondary | ICD-10-CM | POA: Diagnosis not present

## 2021-09-28 DIAGNOSIS — F17210 Nicotine dependence, cigarettes, uncomplicated: Secondary | ICD-10-CM | POA: Insufficient documentation

## 2021-09-28 DIAGNOSIS — C50912 Malignant neoplasm of unspecified site of left female breast: Secondary | ICD-10-CM | POA: Diagnosis not present

## 2021-09-28 DIAGNOSIS — I214 Non-ST elevation (NSTEMI) myocardial infarction: Secondary | ICD-10-CM | POA: Diagnosis not present

## 2021-09-28 DIAGNOSIS — I420 Dilated cardiomyopathy: Secondary | ICD-10-CM | POA: Diagnosis not present

## 2021-09-28 DIAGNOSIS — Z79899 Other long term (current) drug therapy: Secondary | ICD-10-CM | POA: Diagnosis not present

## 2021-09-28 HISTORY — PX: IR IMAGING GUIDED PORT INSERTION: IMG5740

## 2021-09-28 IMAGING — XA IR IMAGING GUIDED PORT INSERTION
1 series · 2 of 2 positions shown · non-contrast
Comparison: None.

INDICATION: 40-year-old male with left breast cancer. Port-A-Cath needed for
therapy.

EXAM:
FLUOROSCOPIC AND ULTRASOUND GUIDED PLACEMENT OF A SUBCUTANEOUS PORT

[Series 1: ir fluoro/shunt/fist · 2 of 2 slices shown]
[im 1/2]
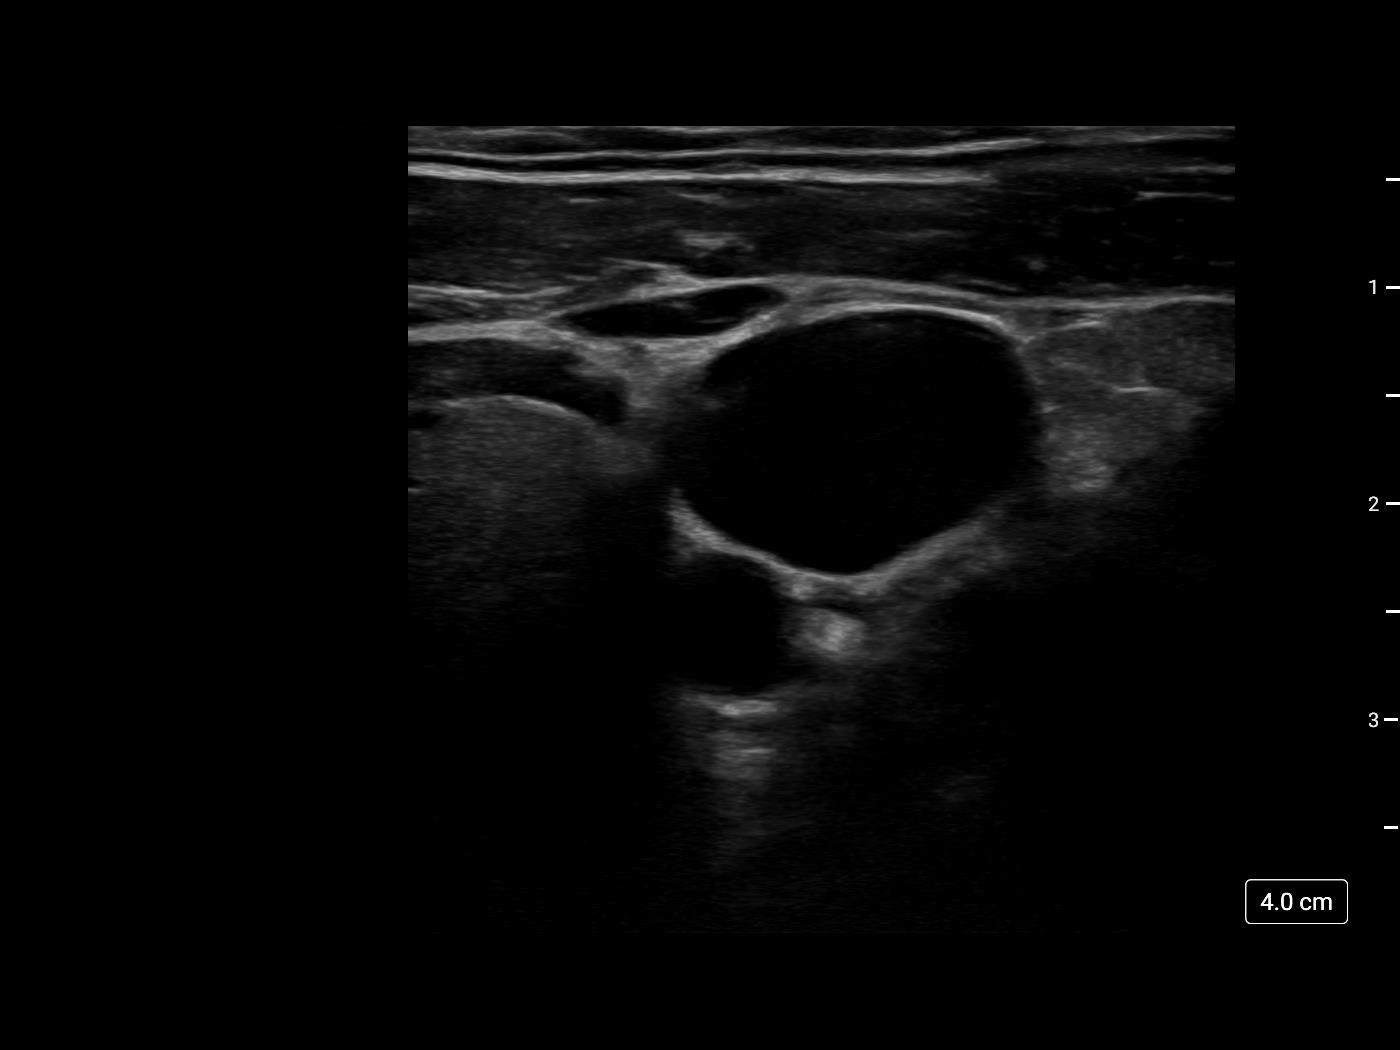
[im 2/2]
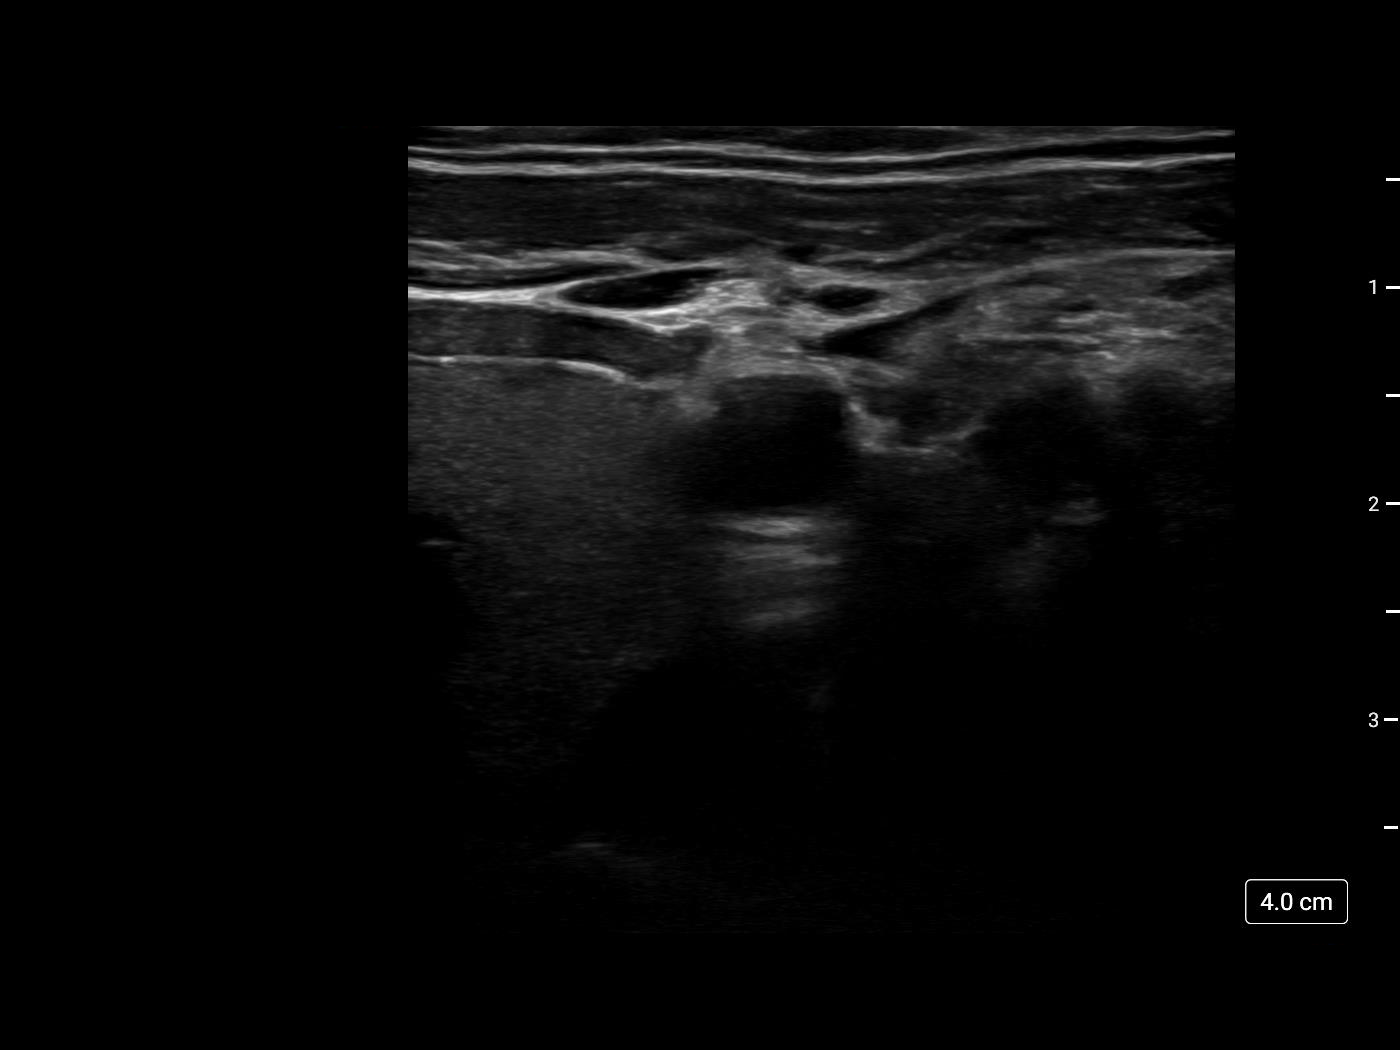

[2 of 2 positions shown; findings below may reference images not displayed]

MEDICATIONS:
Ancef 2 g

ANESTHESIA/SEDATION:
Moderate (conscious) sedation was employed during this procedure. A
total of Versed 6.0 mg and fentanyl 100 mcg was administered
intravenously at the order of the provider performing the procedure.

Total intra-service moderate sedation time: 28 minutes.

Patient's level of consciousness and vital signs were monitored
continuously by radiology nurse throughout the procedure under the
supervision of the provider performing the procedure.

FLUOROSCOPY TIME:  Radiation Exposure Index (as provided by the
fluoroscopic device): 7 mGy Kerma

COMPLICATIONS:
None immediate.

PROCEDURE:
The procedure, risks, benefits, and alternatives were explained to
the patient. Questions regarding the procedure were encouraged and
answered. The patient understands and consents to the procedure.

Patient was placed supine on the interventional table. Ultrasound
confirmed a patent right internal jugular vein. Ultrasound image was
saved for documentation. The right chest and neck were cleaned with
a skin antiseptic and a sterile drape was placed. Maximal barrier
sterile technique was utilized including caps, mask, sterile gowns,
sterile gloves, sterile drape, hand hygiene and skin antiseptic. The
right neck was anesthetized with 1% lidocaine. Small incision was
made in the right neck with a blade. Micropuncture set was placed in
the right internal jugular vein with ultrasound guidance. The
micropuncture wire was used for measurement purposes. The right
chest was anesthetized with 1% lidocaine with epinephrine. #15 blade
was used to make an incision and a subcutaneous port pocket was
formed. 8 french Power Port was assembled. Subcutaneous tunnel was
formed with a stiff tunneling device. The port catheter was brought
through the subcutaneous tunnel. The port was placed in the
subcutaneous pocket. The micropuncture set was exchanged for a
peel-away sheath. The catheter was placed through the peel-away
sheath and the tip was positioned at the superior cavoatrial
junction. Catheter placement was confirmed with fluoroscopy. The
port was accessed and flushed with heparinized saline. The port
pocket was closed using two layers of absorbable sutures and
Dermabond. The vein skin site was closed using a single layer of
absorbable suture and Dermabond. Sterile dressings were applied.
Patient tolerated the procedure well without an immediate
complication. Ultrasound and fluoroscopic images were taken and
saved for this procedure.
IMPRESSION: Placement of a subcutaneous power-injectable port device. Catheter
tip at the superior cavoatrial junction.

## 2021-09-28 MED ORDER — CEFAZOLIN SODIUM-DEXTROSE 2-4 GM/100ML-% IV SOLN
INTRAVENOUS | Status: AC
  Administered 2021-09-28: 2 g via INTRAVENOUS
  Filled 2021-09-28: qty 100

## 2021-09-28 MED ORDER — LIDOCAINE-EPINEPHRINE 1 %-1:100000 IJ SOLN
INTRAMUSCULAR | Status: AC | PRN
  Administered 2021-09-28: 20 mL

## 2021-09-28 MED ORDER — FENTANYL CITRATE (PF) 100 MCG/2ML IJ SOLN
INTRAMUSCULAR | Status: AC | PRN
  Administered 2021-09-28 (×2): 50 ug via INTRAVENOUS

## 2021-09-28 MED ORDER — MIDAZOLAM HCL 2 MG/2ML IJ SOLN
INTRAMUSCULAR | Status: AC | PRN
  Administered 2021-09-28: 2 mg via INTRAVENOUS
  Administered 2021-09-28 (×2): 1 mg via INTRAVENOUS
  Administered 2021-09-28: 2 mg via INTRAVENOUS

## 2021-09-28 MED ORDER — HEPARIN SOD (PORK) LOCK FLUSH 100 UNIT/ML IV SOLN
INTRAVENOUS | Status: AC
  Filled 2021-09-28: qty 5

## 2021-09-28 MED ORDER — SODIUM CHLORIDE 0.9 % IV SOLN
INTRAVENOUS | Status: DC
Start: 2021-09-28 — End: 2021-09-29

## 2021-09-28 MED ORDER — DIPHENHYDRAMINE HCL 50 MG/ML IJ SOLN
INTRAMUSCULAR | Status: AC
  Filled 2021-09-28: qty 1

## 2021-09-28 MED ORDER — LIDOCAINE HCL 1 % IJ SOLN
INTRAMUSCULAR | Status: AC
  Filled 2021-09-28: qty 20

## 2021-09-28 MED ORDER — FENTANYL CITRATE (PF) 100 MCG/2ML IJ SOLN
INTRAMUSCULAR | Status: AC
  Filled 2021-09-28: qty 2

## 2021-09-28 MED ORDER — MIDAZOLAM HCL 2 MG/2ML IJ SOLN
INTRAMUSCULAR | Status: AC
  Filled 2021-09-28: qty 4

## 2021-09-28 MED ORDER — CEFAZOLIN SODIUM-DEXTROSE 2-4 GM/100ML-% IV SOLN: 2.0000 g | Freq: Once | INTRAVENOUS | Status: AC

## 2021-09-28 MED ORDER — MIDAZOLAM HCL 2 MG/2ML IJ SOLN
INTRAMUSCULAR | Status: AC
  Filled 2021-09-28: qty 2

## 2021-09-28 MED ORDER — HEPARIN SOD (PORK) LOCK FLUSH 100 UNIT/ML IV SOLN
INTRAVENOUS | Status: AC | PRN
  Administered 2021-09-28: 500 [IU] via INTRAVENOUS

## 2021-09-28 MED ORDER — LIDOCAINE-EPINEPHRINE 1 %-1:100000 IJ SOLN
INTRAMUSCULAR | Status: AC
  Filled 2021-09-28: qty 1

## 2021-09-28 MED ORDER — DIPHENHYDRAMINE HCL 50 MG/ML IJ SOLN
INTRAMUSCULAR | Status: AC | PRN
Start: 2021-09-28 — End: 2021-09-28
  Administered 2021-09-28: 25 mg via INTRAVENOUS

## 2021-09-28 NOTE — Therapy (Signed)
?OUTPATIENT PHYSICAL THERAPY BREAST CANCER BASELINE EVALUATION ? ? ?Patient Name: Michael Stuart ?MRN: [id number] ?DOB:[birth year], 41 y.o., male ?Today's Date: 10/02/2021 ? ? PT End of Session - 10/02/21 0841   ? ? Visit Number 1   ? Number of Visits 2   ? Date for PT Re-Evaluation 03/26/22   ? PT Start Time [phone]   ? PT Stop Time 0841   ? PT Time Calculation (min) 34 min   ? Activity Tolerance Patient tolerated treatment well   ? Behavior During Therapy [facility] for tasks assessed/performed   ? ?  ?  ? ?  ? ? ?Past Medical History:  ?Diagnosis Date  ? CHF (congestive heart failure) ([place])   ? ?Past Surgical History:  ?Procedure Laterality Date  ? IR IMAGING GUIDED PORT INSERTION  09/28/2021  ? RIGHT/LEFT HEART CATH AND CORONARY ANGIOGRAPHY N/A 08/28/2021  ? Procedure: RIGHT/LEFT HEART CATH AND CORONARY ANGIOGRAPHY;  Surgeon: Nigel Mormon, MD;  Location: [facility];  Service: Cardiovascular;  Laterality: N/A;  ? ?Patient Active Problem List  ? Diagnosis Date Noted  ? 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 History of stroke 09/04/2021  ? CVA (cerebral vascular accident) ([place]) 09/01/2021  ? Cardiomyopathy ([place]) 09/01/2021  ? Chronic systolic CHF (congestive heart failure) ([place])   ? Pure hypercholesterolemia 08/27/2021  ? Tobacco user 08/27/2021  ? Mass of left breast 08/27/2021  ? Acute on chronic combined systolic and diastolic CHF (congestive heart failure) ([place]) 08/27/2021  ? Elevated troponin 08/27/2021  ? Elevated liver enzymes 08/27/2021  ? Leukocytosis 08/27/2021  ? ? ?PCP: Pcp, No ? ?REFERRING PROVIDER: Nicholas Lose, MD ? 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 ?THERAPY DIAG:  ?Abnormal posture ? ?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, estrogen receptor  positive ([place]) ? ?ONSET DATE: 09/15/21 ? ?SUBJECTIVE                                                                                                                                                                                          ? ?SUBJECTIVE STATEMENT: ?Patient reports he is here today to be seen by her medical team for her newly diagnosed left breast cancer.  ? ?PERTINENT HISTORY:  ?Pt palpated a left breast mass at 1 oclock for 1 year.  He had a severe upper respiratory infection and subsequently he was not feeling well.  He was diagnosed with acute congestive heart failure with an EF of  22%.  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  He was discharged home and then suddenly felt numbness on his face and on further evaluation was found to have a stroke. CT scans done in the emergency room on 08/27/2021 revealed 3.8 cm left breast mass with enlarged left axillary lymph nodes.  Mammogram confirmed that the mass to be 4 cm with 5 enlarged lymph nodes.  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 Pt currently undergoing neoadj chemo ? ?PATIENT GOALS   reduce lymphedema risk and learn post op HEP.  ? ?PAIN:  ?Are you having pain? No ? ? ?PRECAUTIONS: Active CA Other: recent viral cardiomyopathy ? ?HAND DOMINANCE: right ? ?WEIGHT BEARING RESTRICTIONS No ? ?FALLS:  ?Has patient fallen in last 6 months? No, Number of falls: 0 ? ?LIVING ENVIRONMENT: ?Patient lives with: mother ?Lives in: House/apartment ?Has following equipment at home: None ? ?OCCUPATION: pt is on medical leave, Insurance underwriter for [employer] ? ?LEISURE: walks frequently, avg 3 miles daily, also hikes ? ?PRIOR LEVEL OF FUNCTION: Independent ? ? ?OBJECTIVE ? ?COGNITION: ? Overall cognitive status: Within functional limits for  tasks assessed   ? ?POSTURE:  ?Forward head and rounded shoulders posture ? ?UPPER EXTREMITY AROM/PROM: ? ?A/PROM RIGHT  10/02/2021 ?  ?Shoulder extension 82  ?Shoulder flexion 180  ?Shoulder abduction 180  ?Shoulder internal rotation 53  ?Shoulder external rotation 100  ?  (Blank rows = not tested) ? ?A/PROM LEFT  10/02/2021  ?Shoulder extension 75  ?Shoulder flexion 181  ?Shoulder abduction 184  ?Shoulder internal rotation 59  ?Shoulder external rotation 85  ?  (Blank rows = not tested) ? ? ?CERVICAL AROM: ?All within normal limits:  ? ? Percent limited  ?Flexion WFL  ?Extension WFL  ?Right lateral flexion WFL  ?Left lateral flexion WFL  ?Right rotation WFL  ?Left rotation WFL  ? ? ? ?UPPER EXTREMITY STRENGTH: WFL ? ? ?LYMPHEDEMA ASSESSMENTS:  ? ?[place] RIGHT  10/02/2021  ?10 cm proximal to olecranon process 34.6  ?Olecranon process 29.6  ?10 cm proximal to ulnar styloid process 26.5  ?Just proximal to ulnar styloid process 18.5  ?Across hand at thumb web space 24.7  ?At base of 2nd digit 7  ?(Blank rows = not tested) ? ?[place] LEFT  10/02/2021  ?10 cm proximal to olecranon process 35.7  ?Olecranon process 30.9  ?10 cm proximal to ulnar styloid process 25.5  ?Just proximal to ulnar styloid process 17.9  ?Across hand at thumb web space 24  ?At base of 2nd digit 7  ?(Blank rows = not tested) ? ? ?L-DEX LYMPHEDEMA SCREENING: ? ?The patient was assessed using the L-Dex machine today to produce a lymphedema index baseline score. The patient will be reassessed on a regular basis (typically every 3 months) to obtain new L-Dex scores. If the score is > 6.5 points away from his/her baseline score indicating onset of subclinical lymphedema, it will be recommended to wear a compression garment for 4 weeks, 12 hours per day and then be reassessed. If the score continues to be > 6.5 points from baseline at reassessment, we will initiate lymphedema treatment. Assessing in this manner has a 95% rate of preventing clinically  significant lymphedema. ? ? L-DEX FLOWSHEETS - 10/02/21 0800   ? ?  ?  L-DEX LYMPHEDEMA SCREENING  ? Measurement Type Unilateral   ? L-DEX MEASUREMENT EXTREMITY Upper Extremity   ? POSITION  Standing   ? DOMINANT SIDE Right   ? At Risk Side Left   ? BASELINE SCORE (UNILATERAL) 3.3   ? ?  ?  ? ?  ? ? ? ?QUICK DASH SURVEY:  ? Katina Dung - 10/02/21 0001   ? ? Open a tight or new jar No difficulty   ? Do heavy household chores (wash walls, wash floors) No difficulty   ? Carry a shopping bag or briefcase No difficulty   ? Wash your back No difficulty   ? Use a knife to cut food No difficulty   ? Recreational activities in which you take some force or impact through your arm, shoulder, or hand (golf, hammering, tennis) No difficulty   ? During the past week, to what extent has your arm, shoulder or hand problem interfered with your normal social activities with family, friends, neighbors, or groups? Not at all   ? During the past week, to what extent has your arm, shoulder or hand problem limited your work or other regular daily activities Not at all   ? Arm, shoulder, or hand pain. None   ? Tingling (pins and needles) in your arm, shoulder, or hand None   ? Difficulty Sleeping No difficulty   ? DASH Score 0 %   ? ?  ?  ? ?  ? ? ? ? ?PATIENT EDUCATION:  ?Education details: Lymphedema risk reduction and post op shoulder/posture HEP ?Person educated: Patient ?Education method: Explanation, Demonstration, Handout ?Education comprehension: Patient verbalized understanding and returned demonstration ? ? ?HOME EXERCISE PROGRAM: ?Patient was instructed today in a home exercise program today for post op shoulder range of motion. These included active assist shoulder flexion in sitting, scapular retraction, wall walking with shoulder abduction, and hands behind head external rotation.  He was encouraged to do these twice a day, holding 3 seconds and repeating 5 times when permitted by his physician. ? ? ?ASSESSMENT: ? ?CLINICAL  IMPRESSION: ?Pt was recently diagnosed with L breast cancer which has spread to the axillary lymph nodes. Pt underwent a SLNB with 1 node sampled. It is ER/PR+, HER2-. He is currently undergoing neoadjuvant che

## 2021-09-28 NOTE — Consult Note (Addendum)
?    ? ?Chief Complaint: ?Patient was seen in consultation today for Port-A-Cath placement ? ?Referring Physician(s): ?Gudena,Vinay ? ?Supervising Physician: Markus Daft ? ?Patient Status: [place] ? ?History of Present Illness: ?Michael Stuart is a 41 y.o. male smoker with history of CHF, prior LV thrombus, cardiomyopathy, prior CVA, hyperlipidemia, and newly diagnosed left breast carcinoma who presents today for Port-A-Cath placement to assist with treatment. ? ?Past Medical History:  ?Diagnosis Date  ? CHF (congestive heart failure) ([place])   ? ? ?Past Surgical History:  ?Procedure Laterality Date  ? RIGHT/LEFT HEART CATH AND CORONARY ANGIOGRAPHY N/A 08/28/2021  ? Procedure: RIGHT/LEFT HEART CATH AND CORONARY ANGIOGRAPHY;  Surgeon: Nigel Mormon, MD;  Location: [facility];  Service: Cardiovascular;  Laterality: N/A;  ? ? ?Allergies: ?Bactrim [sulfamethoxazole-trimethoprim] ? ?Medications: ?Prior to Admission medications   ?Medication Sig Start Date End Date Taking? Authorizing Provider  ?acetaminophen (TYLENOL) 500 MG tablet Take 1,000 mg by mouth every 6 (six) hours as needed for moderate pain.    [provider]  ?albuterol (VENTOLIN HFA) 108 (90 Base) MCG/ACT inhaler Inhale 1-2 puffs into the lungs every 6 (six) hours as needed for wheezing or shortness of breath. 08/14/21   [name], FNP  ?antiseptic oral rinse (BIOTENE) LIQD 15 mLs by Mouth Rinse route as needed for dry mouth.    [provider]  ?apixaban (ELIQUIS) 5 MG TABS tablet Take 1 tablet (5 mg total) by mouth 2 (two) times daily. 09/27/21   Patwardhan, Reynold Bowen, MD  ?atorvastatin (LIPITOR) 20 MG tablet Take 1 tablet (20 mg total) by mouth daily. 09/27/21 10/27/21  Patwardhan, Reynold Bowen, MD  ?dapagliflozin propanediol (FARXIGA) 10 MG TABS tablet Take 1 tablet (10 mg total) by mouth daily. 09/01/21   Patwardhan, Reynold Bowen, MD  ?dexamethasone (DECADRON) 4 MG tablet Take 1 tablet (4 mg total) by mouth daily. Take 1 tablet  day before chemo and 1 tablet day after chemo with food 09/25/21   Nicholas Lose, MD  ?digoxin (LANOXIN) 0.125 MG tablet Take 1 tablet (0.125 mg total) by mouth daily. 09/01/21   Patwardhan, Reynold Bowen, MD  ?eplerenone (INSPRA) 25 MG tablet Take 2 tablets (50 mg total) by mouth daily. 09/01/21   Patwardhan, Reynold Bowen, MD  ?ivabradine (CORLANOR) 5 MG TABS tablet Take 1 tablet (5 mg total) by mouth 2 (two) times daily with a meal. 08/31/21   Patwardhan, Manish J, MD  ?lidocaine-prilocaine (EMLA) cream Apply to affected area once daily as directed 09/25/21   Nicholas Lose, MD  ?LORazepam (ATIVAN) 0.5 MG tablet Take 1 tablet (0.5 mg total) by mouth every 6 (six) hours as needed (Nausea or vomiting). 09/25/21   Nicholas Lose, MD  ?metoprolol succinate (TOPROL-XL) 25 MG 24 hr tablet Take 1 tablet (25 mg total) by mouth daily. 09/01/21   Patwardhan, Reynold Bowen, MD  ?ondansetron (ZOFRAN) 8 MG tablet Take 1 tablet (8 mg total) by mouth 2 (two) times daily as needed for refractory nausea / vomiting. Start on day 3 after chemo. 09/25/21   Nicholas Lose, MD  ?prochlorperazine (COMPAZINE) 10 MG tablet Take 1 tablet (10 mg total) by mouth every 6 (six) hours as needed (Nausea or vomiting). 09/25/21   Nicholas Lose, MD  ?sacubitril-valsartan (ENTRESTO) 24-26 MG Take 1 tablet by mouth 2 (two) times daily. 08/31/21   Patwardhan, Reynold Bowen, MD  ?  ? ?Family History  ?Problem Relation Age of Onset  ? Healthy Mother   ? ? ?  Social History  ? ?Socioeconomic History  ? Marital status: Married  ?  Spouse name: Not on file  ? Number of children: 2  ? Years of education: Not on file  ? Highest education level: Bachelor's degree (e.g., BA, AB, BS)  ?Occupational History  ? Occupation: crash Insurance underwriter  ?  Employer: [employer]  ?Tobacco Use  ? Smoking status: Every Day  ?  Packs/day: 0.25  ?  Years: 25.00  ?  Pack years: 6.25  ?  Types: Cigarettes  ? Smokeless tobacco: Current  ? Tobacco comments:  ?  Smoked 2PPD x 10 years. Last 13 years 3-4 cigarettes/day.    ?Vaping Use  ? Vaping Use: Former  ? Substances: Nicotine  ?Substance and Sexual Activity  ? Alcohol use: Yes  ?  Alcohol/week: 21.0 standard drinks  ?  Types: 21 Cans of beer per week  ?  Comment: 2 tall boys per day, everyday x 20years  ? Drug use: Not Currently  ?  Frequency: 1.0 times per week  ?  Types: Marijuana  ?  Comment: 3x per month  ? Sexual activity: Not Currently  ?Other Topics Concern  ? Not on file  ?Social History Narrative  ? Right handed   ? ?Social Determinants of Health  ? ?Financial Resource Strain: Low Risk   ? Difficulty of Paying Living Expenses: Not very hard  ?Food Insecurity: No Food Insecurity  ? Worried About Charity fundraiser in the Last Year: Never true  ? Ran Out of Food in the Last Year: Never true  ?Transportation Needs: No Transportation Needs  ? Lack of Transportation (Medical): No  ? Lack of Transportation (Non-Medical): No  ?Physical Activity: Not on file  ?Stress: Not on file  ?Social Connections: Not on file  ? ? ? ? ?Review of Systems currently denies fever, headache, chest pain, dyspnea, cough, abdominal/back pain, nausea, vomiting or bleeding. ? ?Vital Signs: ?Vitals:  ? 09/28/21 1253  ?BP: (!) 145/97  ?Pulse: 79  ?Resp: 18  ?Temp: 98.3 ?F (36.8 ?C)  ?SpO2: 100%  ? ? ? ? ?Physical Exam awake, alert.  Chest clear to auscultation bilaterally.  Heart with regular rate and rhythm.  Abdomen soft, positive bowel sounds, nontender.  No lower extremity edema. ? ?Imaging: ?CT HEAD WO CONTRAST (5MM) ? ?Result Date: 09/01/2021 ?CLINICAL DATA:  Neuro deficit, acute, stroke suspected; right facial numbness and blurry vision EXAM: CT HEAD WITHOUT CONTRAST TECHNIQUE: Contiguous axial images were obtained from the base of the skull through the vertex without intravenous contrast. RADIATION DOSE REDUCTION: This exam was performed according to the departmental dose-optimization program which includes automated exposure control, adjustment of the mA and/or kV according to patient size  and/or use of iterative reconstruction technique. COMPARISON:  None. FINDINGS: Brain: There is no acute intracranial hemorrhage or mass effect. Focal low-density in the left frontal subcortical white matter (series 3, image 21). Gray-white differentiation is preserved. There is no extra-axial fluid collection. Ventricles and sulci are within normal limits in size and configuration. Vascular: No hyperdense vessel or unexpected calcification. Skull: Calvarium is unremarkable. Sinuses/Orbits: Paranasal sinus mucosal thickening. Air-fluid level and aerosolized secretions in the left maxillary sinus. Unremarkable orbits. Other: None. IMPRESSION: No acute intracranial hemorrhage or evidence of acute infarction. Area of low density in the left frontal white matter. Contrast enhanced MRI is recommended exclude an underlying lesion. Left maxillary sinus inflammatory changes. Acute sinusitis is possible in the appropriate clinical setting. Electronically Signed   By: Malachi Carl  Patel M.D.   On: 09/01/2021 14:36  ? ?MR Brain W and Wo Contrast ? ?Result Date: 09/01/2021 ?CLINICAL DATA:  Abnormal head CT EXAM: MRI HEAD WITHOUT AND WITH CONTRAST TECHNIQUE: Multiplanar, multiecho pulse sequences of the brain and surrounding structures were obtained without and with intravenous contrast. CONTRAST:  85m GADAVIST GADOBUTROL 1 MMOL/ML IV SOLN COMPARISON:  CT head earlier same day FINDINGS: Brain: There is cortical/subcortical mildly reduced diffusion in the left frontal lobe corresponding to abnormality on CT. There is some associated susceptibility likely reflecting petechial hemorrhage. Minimal curvilinear enhancement is present. Patchy cortical/subcortical reduced diffusion is also present in the left greater than right occipital lobes and left posterior temporal lobe with additional involvement of the superomedial right thalamus, posterior left periventricular white matter, and right frontal lobe along the medial precentral gyrus.  There is associated enhancement in some of these areas. No mass effect. Ventricles and sulci are normal in size and configuration. A few additional small foci of T2 hyperintensity in the supratentorial white

## 2021-09-28 NOTE — Procedures (Signed)
Interventional Radiology Procedure: ? ? ?Indications: Left breast cancer ? ?Procedure: Port placement ? ?Findings: Right jugular port, tip at SVC/RA junction ? ?Complications: None ?    ?EBL: Minimal, less than 10 ml ? ?Plan: Discharge in one hour.  Keep port site and incisions dry for at least 24 hours.   ? ? ?Jataya Wann R. Anselm Pancoast, MD  ?Pager: [phone] ? ?  ?

## 2021-09-28 NOTE — Progress Notes (Signed)
Pharmacist Chemotherapy Monitoring - Initial Assessment   ? ?Anticipated start date: 10/05/21  ? ?The following has been reviewed per standard work regarding the patient's treatment regimen: ?The patient's diagnosis, treatment plan and drug doses, and organ/hematologic function ?Lab orders and baseline tests specific to treatment regimen  ?The treatment plan start date, drug sequencing, and pre-medications ?Prior authorization status  ?Patient's documented medication list, including drug-drug interaction screen and prescriptions for anti-emetics and supportive care specific to the treatment regimen ?The drug concentrations, fluid compatibility, administration routes, and timing of the medications to be used ?The patient's access for treatment and lifetime cumulative dose history, if applicable  ?The patient's medication allergies and previous infusion related reactions, if applicable  ? ?Changes made to treatment plan:  ?N/A ? ?Follow up needed:  ?N/A ? ? ?Philomena Course, Daniel, ?09/28/2021  1:20 PM  ?

## 2021-09-29 ENCOUNTER — Encounter: Payer: Self-pay | Admitting: Hematology and Oncology

## 2021-09-29 ENCOUNTER — Encounter (HOSPITAL_COMMUNITY)
Admission: RE | Admit: 2021-09-29 | Discharge: 2021-09-29 | Disposition: A | Discharge status: Home / Self Care | Payer: BC Managed Care – PPO | Source: Ambulatory Visit | Attending: Hematology and Oncology | Admitting: Hematology and Oncology

## 2021-09-29 ENCOUNTER — Other Ambulatory Visit (HOSPITAL_COMMUNITY): Payer: Self-pay

## 2021-09-29 ENCOUNTER — Other Ambulatory Visit: Payer: Self-pay

## 2021-09-29 ENCOUNTER — Inpatient Hospital Stay: Payer: BC Managed Care – PPO

## 2021-09-29 ENCOUNTER — Ambulatory Visit (HOSPITAL_COMMUNITY)
Admission: RE | Admit: 2021-09-29 | Discharge: 2021-09-29 | Disposition: A | Discharge status: Home / Self Care | Payer: BC Managed Care – PPO | Source: Ambulatory Visit | Attending: Hematology and Oncology | Admitting: Hematology and Oncology

## 2021-09-29 DIAGNOSIS — E785 Hyperlipidemia, unspecified: Secondary | ICD-10-CM | POA: Diagnosis not present

## 2021-09-29 DIAGNOSIS — C50412 Malignant neoplasm of upper-outer quadrant of left female breast: Secondary | ICD-10-CM

## 2021-09-29 DIAGNOSIS — R590 Localized enlarged lymph nodes: Secondary | ICD-10-CM | POA: Diagnosis not present

## 2021-09-29 DIAGNOSIS — S2242XA Multiple fractures of ribs, left side, initial encounter for closed fracture: Secondary | ICD-10-CM | POA: Diagnosis not present

## 2021-09-29 DIAGNOSIS — N200 Calculus of kidney: Secondary | ICD-10-CM | POA: Diagnosis not present

## 2021-09-29 DIAGNOSIS — I509 Heart failure, unspecified: Secondary | ICD-10-CM | POA: Diagnosis not present

## 2021-09-29 DIAGNOSIS — C50922 Malignant neoplasm of unspecified site of left male breast: Secondary | ICD-10-CM | POA: Diagnosis not present

## 2021-09-29 DIAGNOSIS — Z7901 Long term (current) use of anticoagulants: Secondary | ICD-10-CM | POA: Diagnosis not present

## 2021-09-29 DIAGNOSIS — I429 Cardiomyopathy, unspecified: Secondary | ICD-10-CM | POA: Diagnosis not present

## 2021-09-29 DIAGNOSIS — Z170 Estrogen receptor positive status [ER+]: Secondary | ICD-10-CM | POA: Diagnosis not present

## 2021-09-29 DIAGNOSIS — N6320 Unspecified lump in the left breast, unspecified quadrant: Secondary | ICD-10-CM | POA: Diagnosis not present

## 2021-09-29 DIAGNOSIS — Z8673 Personal history of transient ischemic attack (TIA), and cerebral infarction without residual deficits: Secondary | ICD-10-CM | POA: Diagnosis not present

## 2021-09-29 DIAGNOSIS — F17210 Nicotine dependence, cigarettes, uncomplicated: Secondary | ICD-10-CM | POA: Diagnosis not present

## 2021-09-29 DIAGNOSIS — Z79899 Other long term (current) drug therapy: Secondary | ICD-10-CM | POA: Diagnosis not present

## 2021-09-29 DIAGNOSIS — Z853 Personal history of malignant neoplasm of breast: Secondary | ICD-10-CM | POA: Diagnosis not present

## 2021-09-29 IMAGING — NM NM BONE WHOLE BODY
4 series · 4 of 4 positions shown · non-contrast
Comparison: None available.

CLINICAL DATA: History of breast cancer, stage IIA.

EXAM:
NUCLEAR MEDICINE WHOLE BODY BONE SCAN
TECHNIQUE: Whole body anterior and posterior images were obtained approximately
3 hours after intravenous injection of radiopharmaceutical.
RADIOPHARMACEUTICALS:  20.8 mCi [I7] MDP IV

[Series 1: wbr_bone_40 whole body · 2.66mm/px · 1 of 1 slices shown (1 of 2)]
[im 1/1]
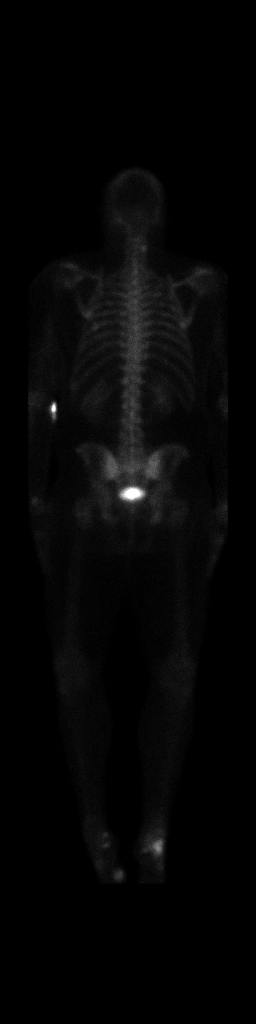

[Series 1: wbr_bone_40 whole body · 2.66mm/px · 1 of 1 slices shown (2 of 2)]
[im 1/1]
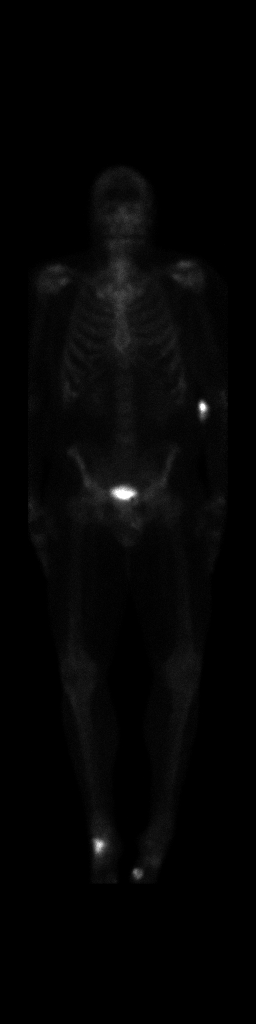

[Series 2: wbr_bone_40 bone statics · 2.07mm/px · 1 of 1 slices shown (1 of 2)]
[im 1/1]
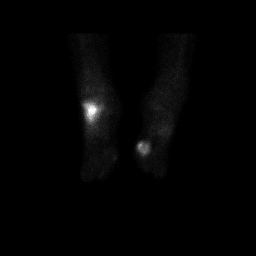

[Series 2: wbr_bone_40 bone statics · 2.07mm/px · 1 of 1 slices shown (2 of 2)]
[im 1/1]
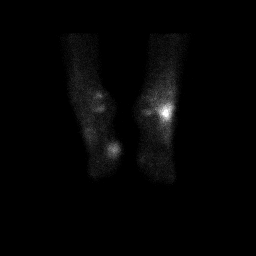

[4 of 4 positions shown; findings below may reference images not displayed]

FINDINGS: There are no foci of increased or decreased radiotracer uptake to
suggest osseous metastatic disease. Focal increased radiotracer
uptake about the lateral aspect of the right ankle and left first
metatarsal head.

Normal physiologic activity is identified within the kidneys and
urinary bladder.
IMPRESSION: 1. No abnormal radiotracer uptake to suggest osseous metastatic
disease.

2. Abnormal increased uptake about the right ankle and left first
metatarsal. This may be secondary to recent trauma or degenerative
arthropathy. Clinical correlation is suggested.

## 2021-09-29 MED ORDER — TECHNETIUM TC 99M MEDRONATE IV KIT
20.8000 | PACK | Freq: Once | INTRAVENOUS | Status: AC | PRN
  Administered 2021-09-29: 20.8 via INTRAVENOUS

## 2021-09-29 MED ORDER — IOHEXOL 300 MG/ML  SOLN
100.0000 mL | Freq: Once | INTRAMUSCULAR | Status: AC | PRN
Start: 2021-09-29 — End: 2021-09-29
  Administered 2021-09-29: 100 mL via INTRAVENOUS

## 2021-10-02 ENCOUNTER — Ambulatory Visit: Payer: BC Managed Care – PPO | Admitting: Cardiology

## 2021-10-02 ENCOUNTER — Encounter: Payer: Self-pay | Admitting: Cardiology

## 2021-10-02 ENCOUNTER — Ambulatory Visit: Payer: BC Managed Care – PPO | Attending: Hematology and Oncology | Admitting: Physical Therapy

## 2021-10-02 ENCOUNTER — Other Ambulatory Visit (HOSPITAL_COMMUNITY): Payer: Self-pay

## 2021-10-02 ENCOUNTER — Encounter: Payer: Self-pay | Admitting: *Deleted

## 2021-10-02 ENCOUNTER — Encounter: Payer: Self-pay | Admitting: Physical Therapy

## 2021-10-02 ENCOUNTER — Other Ambulatory Visit: Payer: Self-pay

## 2021-10-02 VITALS — BP 127/87 | HR 93 | Temp 98.0°F | Resp 16 | Ht 75.0 in | Wt 252.0 lb

## 2021-10-02 DIAGNOSIS — R293 Abnormal posture: Secondary | ICD-10-CM | POA: Diagnosis not present

## 2021-10-02 DIAGNOSIS — I5022 Chronic systolic (congestive) heart failure: Secondary | ICD-10-CM

## 2021-10-02 DIAGNOSIS — C50412 Malignant neoplasm of upper-outer quadrant of left female breast: Secondary | ICD-10-CM | POA: Insufficient documentation

## 2021-10-02 DIAGNOSIS — Z8673 Personal history of transient ischemic attack (TIA), and cerebral infarction without residual deficits: Secondary | ICD-10-CM

## 2021-10-02 DIAGNOSIS — C50422 Malignant neoplasm of upper-outer quadrant of left male breast: Secondary | ICD-10-CM | POA: Diagnosis not present

## 2021-10-02 DIAGNOSIS — Z170 Estrogen receptor positive status [ER+]: Secondary | ICD-10-CM | POA: Insufficient documentation

## 2021-10-02 MED ORDER — APIXABAN 5 MG PO TABS
5.0000 mg | ORAL_TABLET | Freq: Two times a day (BID) | ORAL | 1 refills | Status: DC
  Filled 2021-10-02 – 2021-10-23 (×2): qty 60, 30d supply, fill #0
  Filled 2021-11-23: qty 60, 30d supply, fill #1
  Filled 2021-12-19: qty 60, 30d supply, fill #2

## 2021-10-02 NOTE — Progress Notes (Signed)
? ?Follow up visit ? ?Subjective:  ? ?Michael Stuart, male    DOB: 06/02/1981, 41 y.o.   MRN: [id number] ? ? ?HPI ? ?Chief Complaint  ?Patient presents with  ? HFrEF  ? Follow-up  ?  4 week  ? ? ?  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 She was started on neoadjuvant chemotherapy with Taxotere and Cytoxan, with plans for breast conserving surgery with targeted axillary dissection vs ALND, followed by adjuvant therapy, followed by adjuvant antoestrogen therapy with CDK 4 and 6 inhibitor. ? ?From cardiac standpoint, patient is doing very well.  He is walking 1.5-2 miles with only minimal shortness of breath.  He has not noticed any leg swelling.  He stopped wearing LifeVest as it was irritating his skin overlying his left breast tumor site. ?  ?Discharge summary 08/31/2021: ?Patient has had retracted nipple on left side for almost a year, but did not seek treatment.  He had respiratory viral illness around December 2022, and has had progressive dyspnea since then.  He underwent coronary angiography showed no significant coronary artery disease.  Cardiac MRI showed possible myocarditis.  Patient responded to IV diuresis and initiation of heart failure management with significant improvement in his dyspnea symptoms.  He was also found to have apical thrombus secondary to systolic heart failure.  He was started on heparin/warfarin bridge.  Given slow increase in his INR levels, he was discharged on Lovenox/warfarin bridge.  He was able to ambulate on the day of discharge without any significant symptoms. He was also seen by heart failure specialist Dr. Aundra Dubin.  LifeVest was arranged given his  risk of ventricular arrhythmia.  Oncology was contacted, and is scheduled to see Dr. Lindi Adie on 09/11/2021.   ? ?Patient was re-admitted to the hospital the next day after acute onset vision loss in right eye and facial numbness. CT/MRI showed multifocal small strokes with petechial hemorrhage, detailed below. He was seen by neurology and recommended DOAC given stroke in spite of being on warfarin/lovenox. He was recommended DVT doe eliquis 10 mg bid till 09/07/2021, followed by 5 mg bid, by Dr. Einar Gip. He was discharged a day later. ? ?Patient is here today with his mother. Rt eye partial vision loss is improving, facial numbness is resolved. His breathing is much improved. He was able to walk a mile up the hill without any significant difficulty. He is compliant with his medical therapy. He has f/u scheduled with oncology and heart failure clinic in next 2 weeks.  ? ? ? ?Current Outpatient Medications:  ?  acetaminophen (TYLENOL) 500 MG tablet, Take 1,000 mg by mouth every 6 (six) hours as needed for moderate pain., Disp: , Rfl:  ?  albuterol (VENTOLIN HFA) 108 (90 Base) MCG/ACT inhaler, Inhale 1-2 puffs into the lungs every 6 (six) hours as needed for wheezing or shortness of breath., Disp: 1 each, Rfl: 0 ?  antiseptic oral rinse (BIOTENE) LIQD, 15 mLs by Mouth Rinse route as needed for dry mouth., Disp: , Rfl:  ?  apixaban (ELIQUIS) 5 MG TABS tablet, Take 1 tablet (5 mg total) by mouth 2 (two) times daily., Disp: 60 tablet, Rfl: 3 ?  atorvastatin (LIPITOR) 20 MG tablet, Take 1 tablet (20  mg total) by mouth daily., Disp: 30 tablet, Rfl: 3 ?  dapagliflozin propanediol (FARXIGA) 10 MG TABS tablet, Take 1 tablet (10 mg total) by mouth daily., Disp: 30 tablet, Rfl: 2 ?  dexamethasone (DECADRON) 4 MG tablet, Take 1 tablet (4 mg total) by mouth daily. Take 1 tablet day before chemo and 1 tablet day after chemo with food, Disp: 12 tablet, Rfl: 0 ?  digoxin (LANOXIN) 0.125 MG tablet, Take 1 tablet (0.125 mg total) by mouth  daily., Disp: 30 tablet, Rfl: 2 ?  eplerenone (INSPRA) 25 MG tablet, Take 2 tablets (50 mg total) by mouth daily. (Patient taking differently: Take 25 mg by mouth daily.), Disp: 60 tablet, Rfl: 2 ?  ivabradine (CORLANOR) 5 MG TABS tablet, Take 1 tablet (5 mg total) by mouth 2 (two) times daily with a meal., Disp: 60 tablet, Rfl: 2 ?  lidocaine-prilocaine (EMLA) cream, Apply to affected area once daily as directed, Disp: 30 g, Rfl: 2 ?  LORazepam (ATIVAN) 0.5 MG tablet, Take 1 tablet (0.5 mg total) by mouth every 6 (six) hours as needed (Nausea or vomiting)., Disp: 30 tablet, Rfl: 0 ?  metoprolol succinate (TOPROL-XL) 25 MG 24 hr tablet, Take 1 tablet (25 mg total) by mouth daily., Disp: 30 tablet, Rfl: 2 ?  ondansetron (ZOFRAN) 8 MG tablet, Take 1 tablet (8 mg total) by mouth 2 (two) times daily as needed for refractory nausea / vomiting. Start on day 3 after chemo., Disp: 30 tablet, Rfl: 0 ?  prochlorperazine (COMPAZINE) 10 MG tablet, Take 1 tablet (10 mg total) by mouth every 6 (six) hours as needed (Nausea or vomiting)., Disp: 30 tablet, Rfl: 0 ?  sacubitril-valsartan (ENTRESTO) 24-26 MG, Take 1 tablet by mouth 2 (two) times daily., Disp: 60 tablet, Rfl: 2  ? ?Cardiovascular & other pertient studies: ? ?Reviewed external labs and tests, independently interpreted ? ?EKG 09/04/2021: ?Sinus rhythm 90 bpm  ?Biatrial enlargement ?Diffuse nonspecific T-abnormality ? ?MRI brain 09/01/2021: ?Patchy acute and subacute infarcts as described. Largest area of ?involvement is in the left frontal lobe corresponding to abnormality ?on CT where there is some petechial hemorrhage. Involvement of ?multiple vascular territories suggesting a central source. ?No intracranial mass. ? ?LHC/RHC 08/28/2021: ?Normal coronary arteries without coronary artery disease ?  ?RA: 10 mmHg ?RV: 49/4 mmHg ?PA: 44/24 mmHg, mPAP 34 mmHg ?PCW: 22 mmHg ?  ?CO: 5.7 L/min ?CI: 2.3 L/min/m2 ?  ?Decompensated nonischemic cardiomyopathy ?Resume IV heparin 2  hours after TR band is off given LV apical thrombus ?GDMT for heart failure and continued workup for breast mass ? ?Echocardiogram 08/27/2021: ?1. Left ventricular ejection fraction, by estimation, is 20 to 25%. The  ?left ventricle has severely decreased function. The left ventricle  ?demonstrates global hypokinesis. The left ventricular internal cavity size  ?was dilated. Left ventricular diastolic  ? parameters are consistent with Grade III diastolic dysfunction  ?(restrictive). Elevated left ventricular end-diastolic pressure.  ? 2. Right ventricular systolic function is hyperdynamic. The right  ?ventricular size is mildly enlarged. dilated.  ? 3. Left atrial size was mild to moderately dilated.  ? 4. Right atrial size was dilated.  ? 5. A small pericardial effusion is present. The pericardial effusion is  ?posterior to the left ventricle.  ? 6. The mitral valve is grossly normal. Mild mitral valve regurgitation.  ?No evidence of mitral stenosis.  ? 7. The aortic valve is tricuspid. Aortic valve regurgitation is not  ?visualized. No aortic stenosis is present.  ? 8. There is  mild dilatation of the ascending aorta, measuring 40 mm.  ? 9. The inferior vena cava is dilated in size with >50% respiratory  ?variability, suggesting right atrial pressure of 8 mmHg.  ? ?Recent labs: ?09/02/2021: ?Glucose 134, BUN/Cr 12/0.96. EGFR >60. Na/K 137/4.9. Albumin 3.4. ALT 46. Rest of the CMP normal ?H/H 16/48. MCV 91 . Platelets 232 ?HbA1C 5.6% ?Chol 141, TG 109, HDL 40, LDL 79 ?TSH 0.3 normal ? ? ? ?Review of Systems  ?Eyes:  Positive for vision loss in right eye (Partial).  ?Cardiovascular:  Negative for chest pain, dyspnea on exertion, leg swelling, palpitations and syncope.  ? ?   ? ? ?Vitals:  ? 10/02/21 1255  ?BP: 127/87  ?Pulse: 93  ?Resp: 16  ?Temp: 98 ?F (36.7 ?C)  ?SpO2: 97%  ? ? ? ?Body mass index is 31.5 kg/m?. ?Filed Weights  ? 10/02/21 1255  ?Weight: 252 lb (114.3 kg)  ? ? ? ?Objective:  ? Physical Exam ?Vitals and  nursing note reviewed.  ?Constitutional:   ?   General: He is not in acute distress. ?Neck:  ?   Vascular: No JVD.  ?Cardiovascular:  ?   Rate and Rhythm: Normal rate and regular rhythm.  ?   Heart sounds:

## 2021-10-02 NOTE — Progress Notes (Signed)
? ?Patient Care Team: ?Pcp, No as PCP - General ?Mauro Kaufmann, RN as Oncology Nurse Navigator ?Rockwell Germany, RN as Oncology Nurse Navigator ?Elease Hashimoto (Neurology) ? ?DIAGNOSIS:  ?Encounter Diagnosis  ?Name Primary?  ? 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 ? 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 Palpable left breast mass x8 months. CT 08/27/2021 in ED showed 3.8 ?centimeter left breast mass with left axillary lymph nodes.Mamm and [place]: 4 cm mass with 5 axillary lymph nodes: Biopsy: Grade 2 IDC, lymph node: Positive, ER 100%, PR 50%, Ki-67 25%, HER2 negative ratio 1.25, copy #3.45 ?  ?09/25/2021 Cancer Staging  ? Staging form: Breast, AJCC 8th Edition ?- Clinical: Stage IIA (cT2, cN1, cM0, G2, ER+, PR+, HER2-) - Signed by Nicholas Lose, MD on 09/25/2021 ?Histologic grading system: 3 grade system ? ?  ?10/03/2021 -  Chemotherapy  ? Patient is on Treatment Plan : BREAST TC q21d  ?   ? ? ?CHIEF COMPLIANT: Cycle 1 Taxotere and ctotoxin ? ?INTERVAL HISTORY: Michael Stuart is a  41 y.o. male is here because of recent diagnosis of left breast invasive ductal carcinoma.  Patient palpated a left breast mass at 1 o clock for approximately 1 year.  He had a severe upper respiratory infection and subsequently he was not feeling well.  He was diagnosed with acute congestive heart failure with an EF of 22%.  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  He was discharged home and then suddenly felt numbness on his face and on further evaluation was found to have a stroke.  Subsequent to all this he underwent full evaluation for the breast lump.   ? ?CT scans done in the emergency room on  08/27/2021 revealed 3.8 cm left breast mass with enlarged left axillary lymph nodes.  Mammogram confirmed that the mass to be 4 cm with 5 enlarged lymph nodes.  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   ? ?He presents to the clinic today for Cycle 1 Taxotere and cytoxan. ?He states that he didn't sleep well last night. Complains of being anxious. ? ? ?ALLERGIES:  is allergic to bactrim [sulfamethoxazole-trimethoprim]. ? ?MEDICATIONS:  ?Current Outpatient Medications  ?Medication Sig Dispense Refill  ? acetaminophen (TYLENOL) 500 MG tablet Take 1,000 mg by mouth every 6 (six) hours as needed for moderate pain.    ? antiseptic oral rinse (BIOTENE) LIQD 15 mLs by Mouth Rinse route as needed for dry mouth.    ? apixaban (ELIQUIS) 5 MG TABS tablet Take 1 tablet (5 mg total) by mouth 2 (two) times daily. 180 tablet 1  ? atorvastatin (LIPITOR) 20 MG tablet Take 1 tablet (20 mg total) by mouth daily. 30 tablet 3  ? dapagliflozin propanediol (FARXIGA) 10 MG TABS tablet Take 1 tablet (10 mg total) by mouth daily. 30 tablet 2  ? dexamethasone (DECADRON) 4 MG tablet Take 1 tablet (4 mg total) by mouth daily. Take 1 tablet day before chemo and 1 tablet day after chemo with food 12 tablet 0  ? digoxin (LANOXIN) 0.125 MG tablet Take 1 tablet (0.125 mg total)  by mouth daily. 30 tablet 2  ? eplerenone (INSPRA) 25 MG tablet Take 2 tablets (50 mg total) by mouth daily. (Patient taking differently: Take 25 mg by mouth daily.) 60 tablet 2  ? ivabradine (CORLANOR) 5 MG TABS tablet Take 1 tablet (5 mg total) by mouth 2 (two) times daily with a meal. 60 tablet 2  ? lidocaine-prilocaine (EMLA) cream Apply to affected area once daily as directed 30 g 2  ? LORazepam (ATIVAN) 0.5 MG tablet Take 1 tablet (0.5 mg total) by mouth every 6 (six) hours as needed (Nausea or vomiting). 30 tablet 0  ? metoprolol succinate (TOPROL-XL) 25 MG 24 hr tablet Take 1  tablet (25 mg total) by mouth daily. 30 tablet 2  ? ondansetron (ZOFRAN) 8 MG tablet Take 1 tablet (8 mg total) by mouth 2 (two) times daily as needed for refractory nausea / vomiting. Start on day 3 after chemo. 30 tablet 0  ? prochlorperazine (COMPAZINE) 10 MG tablet Take 1 tablet (10 mg total) by mouth every 6 (six) hours as needed (Nausea or vomiting). 30 tablet 0  ? sacubitril-valsartan (ENTRESTO) 24-26 MG Take 1 tablet by mouth 2 (two) times daily. 60 tablet 2  ? ?No current facility-administered medications for this visit.  ? ? ?PHYSICAL EXAMINATION: ?ECOG PERFORMANCE STATUS: 1 - Symptomatic but completely ambulatory ? ?Vitals:  ? 10/03/21 1122  ?BP: (!) 148/81  ?Pulse: 81  ?Resp: 18  ?Temp: 97.8 ?F (36.6 ?C)  ?SpO2: 100%  ? ?Filed Weights  ? 10/03/21 1122  ?Weight: 256 lb 4.8 oz (116.3 kg)  ? ?  ? ?LABORATORY DATA:  ?I have reviewed the data as listed ? ?  Latest Ref Rng & Units 09/20/2021  ?  9:38 AM 09/13/2021  ? 10:11 AM 09/02/2021  ?  6:46 AM  ?CMP  ?Glucose 70 - 99 mg/dL 103   96   134    ?BUN 6 - 20 mg/dL 13   10   12     ?Creatinine 0.61 - 1.24 mg/dL 0.98   1.10   0.96    ?Sodium 135 - 145 mmol/L 140   141   137    ?Potassium 3.5 - 5.1 mmol/L 5.0   5.2   4.9    ?Chloride 98 - 111 mmol/L 105   107   106    ?CO2 22 - 32 mmol/L 26   25   22     ?Calcium 8.9 - 10.3 mg/dL 9.5   10.0   9.2    ? ? ?Lab Results  ?Component Value Date  ? WBC 12.4 (H) 10/03/2021  ? HGB 14.6 10/03/2021  ? HCT 43.3 10/03/2021  ? MCV 89.5 10/03/2021  ? PLT 200 10/03/2021  ? NEUTROABS 7.6 10/03/2021  ? ? 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 CT 08/27/2021 in ED showed 3.8 cm left breast mass with left axillary lymph nodes.Mamm and [place]: 4 cm mass with 5 axillary lymph nodes: Biopsy: Grade 2 IDC, lymph node: Positive, ER 100%, PR 50%, Ki-67 25%, HER2 negative ratio 1.25, copy #3.45 ?(Recent history of  coronary artery disease EF 22% and stroke) ? ?Treatment plan: ?1. Neoadjuvant chemotherapy with Taxotere and Cytoxan every 3 weeks x6 cycles ?2. Followed by breast conserving surgery with targeted axillary dissection vs ALND ?3. Followed by adjuvant radiation therapy ?4.  Followed by adjuvant antiestrogen therapy with CDK 4 and 6 inhibitor ?  CT CAP and bone scan: No distant metastatic disease ?------------------------------------------------------------------------------------------------------------------------------------------ ?Current treatment: Cycle 1 day 1 Taxotere Cytoxan ?Labs reviewed, chemo education completed, chemo consent obtained ? ?We will be closely monitoring for chemo toxicities. ?Return to clinic in 1 week for toxicity check ? ? ? ?No orders of the defined types were placed in this encounter. ? ?The patient has a good understanding of the overall plan. he agrees with it. he will call with any problems that may develop before the next visit here. ?Total time spent: 30 mins including face to face time and time spent for planning, charting and co-ordination of care ? ? Harriette Ohara, MD ?10/03/21 ? ? ? I Gardiner Coins am scribing for Dr. Lindi Adie ? ?I have reviewed the above documentation for accuracy and completeness, and I agree with the above. ?  ?

## 2021-10-03 ENCOUNTER — Encounter: Payer: Self-pay | Admitting: *Deleted

## 2021-10-03 ENCOUNTER — Inpatient Hospital Stay: Payer: BC Managed Care – PPO

## 2021-10-03 ENCOUNTER — Inpatient Hospital Stay: Payer: BC Managed Care – PPO | Admitting: Hematology and Oncology

## 2021-10-03 VITALS — BP 134/89 | HR 59 | Temp 98.4°F | Resp 16

## 2021-10-03 DIAGNOSIS — Z170 Estrogen receptor positive status [ER+]: Secondary | ICD-10-CM

## 2021-10-03 DIAGNOSIS — Z5189 Encounter for other specified aftercare: Secondary | ICD-10-CM | POA: Diagnosis not present

## 2021-10-03 DIAGNOSIS — Z79899 Other long term (current) drug therapy: Secondary | ICD-10-CM | POA: Diagnosis not present

## 2021-10-03 DIAGNOSIS — C50422 Malignant neoplasm of upper-outer quadrant of left male breast: Secondary | ICD-10-CM | POA: Diagnosis not present

## 2021-10-03 DIAGNOSIS — Z5111 Encounter for antineoplastic chemotherapy: Secondary | ICD-10-CM | POA: Diagnosis not present

## 2021-10-03 DIAGNOSIS — F17210 Nicotine dependence, cigarettes, uncomplicated: Secondary | ICD-10-CM | POA: Diagnosis not present

## 2021-10-03 DIAGNOSIS — Z95828 Presence of other vascular implants and grafts: Secondary | ICD-10-CM

## 2021-10-03 DIAGNOSIS — Z7952 Long term (current) use of systemic steroids: Secondary | ICD-10-CM | POA: Diagnosis not present

## 2021-10-03 DIAGNOSIS — Z7901 Long term (current) use of anticoagulants: Secondary | ICD-10-CM | POA: Diagnosis not present

## 2021-10-03 DIAGNOSIS — Z7984 Long term (current) use of oral hypoglycemic drugs: Secondary | ICD-10-CM | POA: Diagnosis not present

## 2021-10-03 DIAGNOSIS — I509 Heart failure, unspecified: Secondary | ICD-10-CM | POA: Diagnosis not present

## 2021-10-03 DIAGNOSIS — C773 Secondary and unspecified malignant neoplasm of axilla and upper limb lymph nodes: Secondary | ICD-10-CM | POA: Diagnosis not present

## 2021-10-03 LAB — CMP (CANCER CENTER ONLY)
ALT: 18 U/L (ref 0–44)
AST: 17 U/L (ref 15–41)
Albumin: 4.2 g/dL (ref 3.5–5.0)
Alkaline Phosphatase: 64 U/L (ref 38–126)
Anion gap: 6 (ref 5–15)
BUN: 13 mg/dL (ref 6–20)
CO2: 26 mmol/L (ref 22–32)
Calcium: 9.7 mg/dL (ref 8.9–10.3)
Chloride: 107 mmol/L (ref 98–111)
Creatinine: 0.74 mg/dL (ref 0.61–1.24)
GFR, Estimated: >60 mL/min (ref >60)
Glucose, Bld: 99 mg/dL (ref 70–99)
Potassium: 4.3 mmol/L (ref 3.5–5.1)
Sodium: 139 mmol/L (ref 135–145)
Total Bilirubin: 0.4 mg/dL (ref 0.3–1.2)
Total Protein: 7.5 g/dL (ref 6.5–8.1)

## 2021-10-03 LAB — CBC WITH DIFFERENTIAL (CANCER CENTER ONLY)
Abs Immature Granulocytes: 0.05 10*3/uL (ref 0.00–0.07)
Basophils Absolute: 0.1 10*3/uL (ref 0.0–0.1)
Basophils Relative: 0 %
Eosinophils Absolute: 0 10*3/uL (ref 0.0–0.5)
Eosinophils Relative: 0 %
HCT: 43.3 % (ref 39.0–52.0)
Hemoglobin: 14.6 g/dL (ref 13.0–17.0)
Immature Granulocytes: 0 %
Lymphocytes Relative: 27 %
Lymphs Abs: 3.3 10*3/uL (ref 0.7–4.0)
MCH: 30.2 pg (ref 26.0–34.0)
MCHC: 33.7 g/dL (ref 30.0–36.0)
MCV: 89.5 fL (ref 80.0–100.0)
Monocytes Absolute: 1.5 10*3/uL — ABNORMAL HIGH (ref 0.1–1.0)
Monocytes Relative: 12 %
Neutro Abs: 7.6 10*3/uL (ref 1.7–7.7)
Neutrophils Relative %: 61 %
Platelet Count: 200 10*3/uL (ref 150–400)
RBC: 4.84 MIL/uL (ref 4.22–5.81)
RDW: 12.7 % (ref 11.5–15.5)
WBC Count: 12.4 10*3/uL — ABNORMAL HIGH (ref 4.0–10.5)
nRBC: 0 % (ref 0.0–0.2)

## 2021-10-03 MED ORDER — PALONOSETRON HCL INJECTION 0.25 MG/5ML
0.2500 mg | Freq: Once | INTRAVENOUS | Status: AC
  Administered 2021-10-03: 0.25 mg via INTRAVENOUS
  Filled 2021-10-03: qty 5

## 2021-10-03 MED ORDER — SODIUM CHLORIDE 0.9 % IV SOLN
150.0000 mg | Freq: Once | INTRAVENOUS | Status: AC
  Administered 2021-10-03: 150 mg via INTRAVENOUS
  Filled 2021-10-03: qty 150

## 2021-10-03 MED ORDER — SODIUM CHLORIDE 0.9 % IV SOLN
10.0000 mg | Freq: Once | INTRAVENOUS | Status: AC
  Administered 2021-10-03: 10 mg via INTRAVENOUS
  Filled 2021-10-03: qty 10

## 2021-10-03 MED ORDER — SODIUM CHLORIDE 0.9 % IV SOLN: Freq: Once | INTRAVENOUS | Status: AC

## 2021-10-03 MED ORDER — SODIUM CHLORIDE 0.9% FLUSH
10.0000 mL | INTRAVENOUS | Status: DC | PRN
  Administered 2021-10-03: 10 mL

## 2021-10-03 MED ORDER — SODIUM CHLORIDE 0.9 % IV SOLN
600.0000 mg/m2 | Freq: Once | INTRAVENOUS | Status: AC
  Administered 2021-10-03: 1480 mg via INTRAVENOUS
  Filled 2021-10-03: qty 74

## 2021-10-03 MED ORDER — HEPARIN SOD (PORK) LOCK FLUSH 100 UNIT/ML IV SOLN
500.0000 [IU] | Freq: Once | INTRAVENOUS | Status: AC | PRN
  Administered 2021-10-03: 500 [IU]

## 2021-10-03 MED ORDER — SODIUM CHLORIDE 0.9 % IV SOLN
75.0000 mg/m2 | Freq: Once | INTRAVENOUS | Status: AC
  Administered 2021-10-03: 190 mg via INTRAVENOUS
  Filled 2021-10-03: qty 19

## 2021-10-03 MED ORDER — SODIUM CHLORIDE 0.9% FLUSH
10.0000 mL | Freq: Once | INTRAVENOUS | Status: AC
  Administered 2021-10-03: 10 mL

## 2021-10-03 NOTE — Patient Instructions (Addendum)
[facility]  Discharge Instructions: ?Thank you for choosing [facility] to provide your oncology and hematology care.  ? ?If you have a lab appointment with the [facility], please go directly to the [facility] and check in at the registration area. ?  ?Wear comfortable clothing and clothing appropriate for easy access to any Portacath or PICC line.  ? ?We strive to give you quality time with your provider. You may need to reschedule your appointment if you arrive late (15 or more minutes).  Arriving late affects you and other patients whose appointments are after yours.  Also, if you miss three or more appointments without notifying the office, you may be dismissed from the clinic at the provider?s discretion.    ?  ?For prescription refill requests, have your pharmacy contact our office and allow 72 hours for refills to be completed.   ? ?Today you received the following chemotherapy and/or immunotherapy agents: Docetaxel and Cytoxan ?  ?To help prevent nausea and vomiting after your treatment, we encourage you to take your nausea medication as directed. ? ?BELOW ARE SYMPTOMS THAT SHOULD BE REPORTED IMMEDIATELY: ?*FEVER GREATER THAN 100.4 F (38 ?C) OR HIGHER ?*CHILLS OR SWEATING ?*NAUSEA AND VOMITING THAT IS NOT CONTROLLED WITH YOUR NAUSEA MEDICATION ?*UNUSUAL SHORTNESS OF BREATH ?*UNUSUAL BRUISING OR BLEEDING ?*URINARY PROBLEMS (pain or burning when urinating, or frequent urination) ?*BOWEL PROBLEMS (unusual diarrhea, constipation, pain near the anus) ?TENDERNESS IN MOUTH AND THROAT WITH OR WITHOUT PRESENCE OF ULCERS (sore throat, sores in mouth, or a toothache) ?UNUSUAL RASH, SWELLING OR PAIN  ?UNUSUAL VAGINAL DISCHARGE OR ITCHING  ? ?Items with * indicate a potential emergency and should be followed up as soon as possible or go to the Emergency Department if any problems should occur. ? ?Please show the CHEMOTHERAPY ALERT CARD or IMMUNOTHERAPY ALERT CARD at  check-in to the Emergency Department and triage nurse. ? ?Should you have questions after your visit or need to cancel or reschedule your appointment, please contact [facility]  Dept: [phone]  and follow the prompts.  Office hours are 8:00 a.m. to 4:30 p.m. Monday - Friday. Please note that voicemails left after 4:00 p.m. may not be returned until the following business day.  We are closed weekends and major holidays. You have access to a nurse at all times for urgent questions. Please call the main number to the clinic Dept: [phone] and follow the prompts. ? ? ?For any non-urgent questions, you may also contact your provider using MyChart. We now offer e-Visits for anyone 28 and older to request care online for non-urgent symptoms. For details visit [email or web address]. ?  ?Also download the MyChart app! Go to the app store, search "MyChart", open the app, select [facility], and log in with your MyChart username and password. ? ?Due to Covid, a mask is required upon entering the hospital/clinic. If you do not have a mask, one will be given to you upon arrival. For doctor visits, patients may have 1 support person aged 7 or older with them. For treatment visits, patients cannot have anyone with them due to current Covid guidelines and our immunocompromised population.  ? ?Docetaxel injection ?What is this medication? ?DOCETAXEL (doe se TAX el) is a chemotherapy drug. It targets fast dividing cells, like cancer cells, and causes these cells to die. This medicine is used to treat many types of cancers like breast cancer, certain stomach cancers, head and neck cancer, lung  cancer, and prostate cancer. ?This medicine may be used for other purposes; ask your health care provider or pharmacist if you have questions. ?COMMON BRAND NAME(S): Docefrez, Taxotere ?What should I tell my care team before I take this medication? ?They need to know if you have any of these  conditions: ?infection (especially a virus infection such as chickenpox, cold sores, or herpes) ?liver disease ?low blood counts, like low white cell, platelet, or red cell counts ?an unusual or allergic reaction to docetaxel, polysorbate 80, other chemotherapy agents, other medicines, foods, dyes, or preservatives ?pregnant or trying to get pregnant ?breast-feeding ?How should I use this medication? ?This drug is given as an infusion into a vein. It is administered in a hospital or clinic by a specially trained health care professional. ?Talk to your pediatrician regarding the use of this medicine in children. Special care may be needed. ?Overdosage: If you think you have taken too much of this medicine contact a poison control center or emergency room at once. ?NOTE: This medicine is only for you. Do not share this medicine with others. ?What if I miss a dose? ?It is important not to miss your dose. Call your doctor or health care professional if you are unable to keep an appointment. ?What may interact with this medication? ?Do not take this medicine with any of the following medications: ?live virus vaccines ?This medicine may also interact with the following medications: ?aprepitant ?certain antibiotics like erythromycin or clarithromycin ?certain antivirals for HIV or hepatitis ?certain medicines for fungal infections like fluconazole, itraconazole, ketoconazole, posaconazole, or voriconazole ?cimetidine ?ciprofloxacin ?conivaptan ?cyclosporine ?dronedarone ?fluvoxamine ?grapefruit juice ?imatinib ?verapamil ?This list may not describe all possible interactions. Give your health care provider a list of all the medicines, herbs, non-prescription drugs, or dietary supplements you use. Also tell them if you smoke, drink alcohol, or use illegal drugs. Some items may interact with your medicine. ?What should I watch for while using this medication? ?Your condition will be monitored carefully while you are receiving  this medicine. You will need important blood work done while you are taking this medicine. ?Call your doctor or health care professional for advice if you get a fever, chills or sore throat, or other symptoms of a cold or flu. Do not treat yourself. This drug decreases your body's ability to fight infections. Try to avoid being around people who are sick. ?Some products may contain alcohol. Ask your health care professional if this medicine contains alcohol. Be sure to tell all health care professionals you are taking this medicine. Certain medicines, like metronidazole and disulfiram, can cause an unpleasant reaction when taken with alcohol. The reaction includes flushing, headache, nausea, vomiting, sweating, and increased thirst. The reaction can last from 30 minutes to several hours. ?You may get drowsy or dizzy. Do not drive, use machinery, or do anything that needs mental alertness until you know how this medicine affects you. Do not stand or sit up quickly, especially if you are an older patient. This reduces the risk of dizzy or fainting spells. Alcohol may interfere with the effect of this medicine. ?Talk to your health care professional about your risk of cancer. You may be more at risk for certain types of cancer if you take this medicine. ?Do not become pregnant while taking this medicine or for 6 months after stopping it. Women should inform their doctor if they wish to become pregnant or think they might be pregnant. There is a potential for serious side effects to an  unborn child. Talk to your health care professional or pharmacist for more information. Do not breast-feed an infant while taking this medicine or for 1 week after stopping it. ?Males who get this medicine must use a condom during sex with females who can get pregnant. If you get a woman pregnant, the baby could have birth defects. The baby could die before they are born. You will need to continue wearing a condom for 3 months after  stopping the medicine. Tell your health care provider right away if your partner becomes pregnant while you are taking this medicine. ?This may interfere with the ability to father a child. You should talk to your docto

## 2021-10-03 NOTE — Assessment & Plan Note (Signed)
09/15/2021:Palpable left breast mass x8 months. CT 08/27/2021 in ED showed 3.8 cm left breast mass with left axillary lymph nodes.Mamm and [place]: 4 cm mass with 5 axillary lymph nodes: Biopsy: Grade 2 IDC, lymph node: Positive, ER 100%, PR 50%, Ki-67 25%, HER2 negative ratio 1.25, copy #3.45 ? ?Treatment plan: ?1. Neoadjuvant chemotherapy with Taxotere and Cytoxan every 3 weeks x6 cycles ?2. Followed by breast conserving surgery with targeted axillary dissection vs ALND ?3. Followed by adjuvant radiation therapy ?4.  Followed by adjuvant antiestrogen therapy with CDK 4 and 6 inhibitor ?------------------------------------------------------------------------------------------------------------------------------------------ ?Current treatment: Cycle 1 day 1 Taxotere Cytoxan ?Labs reviewed, chemo education completed, chemo consent obtained ?Return to clinic in 1 week for toxicity check ? ?

## 2021-10-04 ENCOUNTER — Ambulatory Visit (HOSPITAL_COMMUNITY): Payer: BC Managed Care – PPO

## 2021-10-04 ENCOUNTER — Encounter: Payer: Self-pay | Admitting: Hematology and Oncology

## 2021-10-04 ENCOUNTER — Telehealth: Payer: Self-pay

## 2021-10-04 NOTE — Telephone Encounter (Signed)
Michael Stuart states that he is doing fine.  He is eating, drinking, and urinating well. ?He knows to call the office at [phone] if he has any questions or concerns. ?

## 2021-10-04 NOTE — Progress Notes (Signed)
Called pt to introduce myself as his Arboriculturist, discuss copay assistance and the [employer].  Pt stated he has met his deductible for the year so his insurance should pay for his treatment at 100% so copay assistance isn't needed.  I also informed him of the [employer] and went over what it covers, pt declined the grant at this time.  I will request for the registration staff give him my card in case he changes his mind about applying for the grant and for any questions or concerns he may have in the future.  ?

## 2021-10-04 NOTE — Progress Notes (Incomplete)
***In Progress*** ? ?  ?Advanced Heart Failure Clinic Note  ? ?HPI: Primary Care: Pcp, No ?General Cardiologist: Dr. Virgina Jock ?HF Cardiologist: Dr. Aundra Dubin ?  ?HPI: ?Michael Stuart is a 41 y.o. 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  ?Admitted 03/8337 with new systolic heart failure. Echo showed dilated LV with EF 20-25%.  LHC/RHC was done, showing elevated filling pressures but relatively preserved cardiac index of 2.3.  There was no significant coronary disease. cMRI showed LV dilation with EF 10%, LV thrombus, mildly dilated RV with EF 15%.  Delayed enhancement imaging showed inferolateral LGE that was potentially thought to be a OM territory infarct, but corresponding lesion was not seen by cath. Cardiology suspected potential acute myocarditis given subepicardial apical inferior LGE also (the RV insertion site LGE can be nonspecific in the setting of volume/pressure overload). Elevated T2 in the apical inferior wall was thought to be more suggestive of acute myocarditis and less likely cardiac sarcoidosis. He was started on Coumadin for thrombus. He had transient LOC after MRI and was orthostatic, felt to be vagally-mediated. Also noted to have breast mass and new axillary lymphadenopathy, suspect breast cancer. Patient was diuresed and started on GDMT.  He was discharged with LifeVest with oncology follow up. ?  ?Readmitted 1 day after discharge with stroke-like symptoms of new left facial numbness, left facial drop and vision changes. INR 1.5 on admission, patient compliant with Lovenox bridging since discharge. CT head negative, MRI showed patchy acute and subacute infarcts. Neurology consulted and recommended DOAC given stroke in spite of being on warfarin/Lovenox. Deficits resolved and he was discharged home, weight 254 lbs. ?  ?He was last seen with APP for for post  hospital HF follow up with his mother on 09/13/2021. Reported overall feeling fine. Denied dyspnea with activity or if he walked up steps.  Denied palpitations, abnormal bleeding, CP, dizziness, edema, or PND/Orthopnea. Appetite was ok. Denied  fever or chills. Reported weight at home was 246.6 pounds. Reported compliance with all medications. He did not wear his LifeVest at this visit, he reported vest made him break out under left breast. ***Stopped smoking, drinking ETOH 1-2 drinks/day. 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 His father had a history of CAD. Dr. Virgina Jock advised no driving. ? ?*** She was started on neoadjuvant chemotherapy with Taxotere and Cytoxan, with plans for breast conserving surgery with targeted axillary dissection vs ALND, followed by adjuvant therapy, followed by adjuvant antoestrogen therapy with CDK 4 and 6 inhibitor. ? ? ?Today he returns to HF clinic for pharmacist medication titration. At last visit with APP, K was elevated, and he held eplerenone and reduced dose to 25 mg daily. K is since WNL.  ? ?Overall feeling ***. ?Dizziness, lightheadedness, fatigue:  ?Chest pain or palpitations: ? ?How is your breathing?: *** ?SOB: ?Able to complete all ADLs. Activity level *** ? ?Weight at home pounds. Takes furosemide/torsemide/bumex *** mg *** daily.  ?LEE ?PND/Orthopnea ? ?Appetite *** ?Low-salt diet:  ? ?Physical Exam ?Cost/affordability of meds ? ? ?HF Medications: ?- metoprolol succinate 25 mg daily ?- Entresto 24-26 mg BID  ?- eplerenone 25 mg daily ?- Farxiga 10 mg daily ?- Ivabradine 5 mg BID ?- Digoxin 0.125 daily ? ?Has the patient been experiencing any side effects to the medications prescribed?  {YES NO:22349} ? ?Does the patient have  any problems obtaining medications due to transportation or finances?   {YES NO:22349} ? ?Understanding of regimen: {excellent/good/fair/poor:19665} ?Understanding of indications:  {excellent/good/fair/poor:19665} ?Potential of compliance: {excellent/good/fair/poor:19665} ?Patient understands to avoid NSAIDs. ?Patient understands to avoid decongestants. ?  ? ?Pertinent Lab Values: ?10/03/2021 - Serum creatinine 0.74, BUN 13, Potassium 4.3, Sodium 139 ? ?Vital Signs: ?Weight: *** (last clinic weight: 253 lbs.) ?Blood pressure: ***  ?Heart rate: ***  ? ?Assessment/Plan: ?1. Chronic Systolic CHF: Patient presents with CHF after a viral-type illness. HIV, TSH, COVID-19 test, and UDS were all negative. I do not think he drinks enough ETOH to cause CMP. Patient has no family history of cardiomyopathy.  LHC/RHC showed no significant CAD, elevated filling pressures and relatively preserved cardiac output. Echo showed EF 20-25%.   Cardiac MRI was done today, showing severe LV dilation with EF 10%, LV thrombus, mildly dilated RV with EF 15%.  Delayed enhancement imaging showed inferolateral LGE that could be a OM territory infarct, but corresponding lesion not seen by cath. Overall, most likely acute myocarditis given subepicardial apical inferior LGE also (the RV insertion site LGE can be nonspecific in the setting of volume/pressure overload). Elevated T2 in the apical inferior wall is more suggestive of acute myocarditis. 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  ? ?Stable NYHA II, he is not volume overloaded on exam. ?- He does not need daily loops.  ?- Continue metoprolol succinate 25 mg daily.   ?- Continue Entresto 24/26 mg bid.  ?- Continue eplerenone 50 mg daily.  ?- Continue Farxiga 10 mg daily. ?- Continue ivabradine 5 mg bid. HR 86 today ?- Continue digoxin 0.125 daily. Check dig level today. ?- Will repeat echo at 3 months to see if he needs ICD.  ? ?2. LV thrombus: Previously on Coumadin, now on DOAC with new CVA (see below). ?- Continue Eliquis.  ? ?3. 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 Now on Eliquis 5 mg bid. No deficits. ?- He has neurology follow up soon. ? ?4. Syncope: Episode of non-arrhythmic syncope in the MRI, suspect this was vagally-mediated based on history.  BP stable. No further events. ? ?5. Breast mass:  Noted on left by imaging with axillary lymphadenopathy, left breast invasive ductal carcinoma. No breast pain or tenderness. He has oncology followup. ?- Continue eplerenone 50 mg daily. ?  ?Follow up in 3 weeks with APP and 9 weeks with Dr. Aundra Dubin + echo. ? ? ?Audry Riles, PharmD, BCPS, BCCP, CPP ?Heart Failure Clinic Pharmacist ?[phone] ? ? ?

## 2021-10-04 NOTE — Telephone Encounter (Signed)
-----   Message from Charleston Poot, RN sent at 10/03/2021  3:54 PM EDT ----- ?Regarding: First Time/ Docetaxel and Cytoxan/ Dr Lindi Adie pt ?Hey there, ? ?Pt had first time Docetaxel and Cytoxan today. Tolerated well.  ? ?Thanks! ?Libbie K. ? ?

## 2021-10-05 ENCOUNTER — Inpatient Hospital Stay: Payer: BC Managed Care – PPO

## 2021-10-05 ENCOUNTER — Other Ambulatory Visit: Payer: Self-pay

## 2021-10-05 ENCOUNTER — Ambulatory Visit (HOSPITAL_COMMUNITY)
Admission: RE | Admit: 2021-10-05 | Discharge: 2021-10-05 | Disposition: A | Discharge status: Home / Self Care | Payer: BC Managed Care – PPO | Source: Ambulatory Visit | Attending: Internal Medicine | Admitting: Internal Medicine

## 2021-10-05 ENCOUNTER — Other Ambulatory Visit (HOSPITAL_COMMUNITY): Payer: Self-pay

## 2021-10-05 VITALS — BP 122/78 | HR 73 | Wt 262.6 lb

## 2021-10-05 DIAGNOSIS — Z5111 Encounter for antineoplastic chemotherapy: Secondary | ICD-10-CM | POA: Diagnosis not present

## 2021-10-05 DIAGNOSIS — C50422 Malignant neoplasm of upper-outer quadrant of left male breast: Secondary | ICD-10-CM | POA: Diagnosis not present

## 2021-10-05 DIAGNOSIS — Z8673 Personal history of transient ischemic attack (TIA), and cerebral infarction without residual deficits: Secondary | ICD-10-CM | POA: Diagnosis not present

## 2021-10-05 DIAGNOSIS — C50922 Malignant neoplasm of unspecified site of left male breast: Secondary | ICD-10-CM | POA: Diagnosis not present

## 2021-10-05 DIAGNOSIS — I513 Intracardiac thrombosis, not elsewhere classified: Secondary | ICD-10-CM | POA: Diagnosis not present

## 2021-10-05 DIAGNOSIS — Z79899 Other long term (current) drug therapy: Secondary | ICD-10-CM | POA: Insufficient documentation

## 2021-10-05 DIAGNOSIS — C773 Secondary and unspecified malignant neoplasm of axilla and upper limb lymph nodes: Secondary | ICD-10-CM | POA: Diagnosis not present

## 2021-10-05 DIAGNOSIS — I509 Heart failure, unspecified: Secondary | ICD-10-CM | POA: Diagnosis not present

## 2021-10-05 DIAGNOSIS — Z7901 Long term (current) use of anticoagulants: Secondary | ICD-10-CM | POA: Diagnosis not present

## 2021-10-05 DIAGNOSIS — Z7952 Long term (current) use of systemic steroids: Secondary | ICD-10-CM | POA: Diagnosis not present

## 2021-10-05 DIAGNOSIS — Z87891 Personal history of nicotine dependence: Secondary | ICD-10-CM | POA: Diagnosis not present

## 2021-10-05 DIAGNOSIS — Z5189 Encounter for other specified aftercare: Secondary | ICD-10-CM | POA: Diagnosis not present

## 2021-10-05 DIAGNOSIS — I5022 Chronic systolic (congestive) heart failure: Secondary | ICD-10-CM | POA: Diagnosis not present

## 2021-10-05 DIAGNOSIS — Z7984 Long term (current) use of oral hypoglycemic drugs: Secondary | ICD-10-CM | POA: Diagnosis not present

## 2021-10-05 DIAGNOSIS — C50412 Malignant neoplasm of upper-outer quadrant of left female breast: Secondary | ICD-10-CM

## 2021-10-05 DIAGNOSIS — Z170 Estrogen receptor positive status [ER+]: Secondary | ICD-10-CM | POA: Diagnosis not present

## 2021-10-05 DIAGNOSIS — F17210 Nicotine dependence, cigarettes, uncomplicated: Secondary | ICD-10-CM | POA: Diagnosis not present

## 2021-10-05 MED ORDER — PEGFILGRASTIM-CBQV 6 MG/0.6ML ~~LOC~~ SOSY
6.0000 mg | PREFILLED_SYRINGE | Freq: Once | SUBCUTANEOUS | Status: AC
  Administered 2021-10-05: 6 mg via SUBCUTANEOUS

## 2021-10-05 MED ORDER — SACUBITRIL-VALSARTAN 49-51 MG PO TABS
1.0000 | ORAL_TABLET | Freq: Two times a day (BID) | ORAL | 11 refills | Status: DC
  Filled 2021-10-05: qty 60, 30d supply, fill #0

## 2021-10-05 MED ORDER — PEGFILGRASTIM-CBQV 6 MG/0.6ML ~~LOC~~ SOSY
PREFILLED_SYRINGE | SUBCUTANEOUS | Status: AC
  Filled 2021-10-05: qty 0.6

## 2021-10-05 NOTE — Patient Instructions (Signed)

## 2021-10-05 NOTE — Progress Notes (Signed)
?  ?Advanced Heart Failure Clinic Note  ? ?HPI: Primary Care: Pcp, No ?General Cardiologist: Dr. Virgina Jock ?HF Cardiologist: Dr. Aundra Dubin ?  ?HPI: ?Michael Stuart is a 41 y.o. male with minimal previous past medical history, and new diagnosis of systolic heart failure, LV thrombus and left breast invasive ductal carcinoma. ?  ?Admitted 08/252 with new systolic heart failure. Echo showed dilated LV with EF 20-25%.  LHC/RHC showed elevated filling pressures with relatively preserved cardiac index of 2.3 and no significant coronary disease. cMRI showed LV dilation with EF 10%, LV thrombus, mildly dilated RV with EF 15%. Delayed enhancement imaging showed inferolateral LGE that was potentially thought to be an OM territory infarct, but corresponding lesion was not seen by cath. Cardiology suspected potential acute myocarditis given subepicardial apical inferior LGE (the RV insertion site LGE can be nonspecific in the setting of volume/pressure overload). Elevated T2 in the apical inferior wall was suggestive of acute myocarditis rather than cardiac sarcoidosis. He was started on Coumadin for thrombus. He had transient LOC after MRI and was orthostatic, possibly vagally-mediated. Also noted to have breast mass and new axillary lymphadenopathy, suggestive of breast cancer. Patient was diuresed and started on GDMT.  He was discharged with LifeVest and oncology follow up. ?  ?Readmitted 1 day after discharge with stroke-like symptoms of new left facial numbness, left facial droop and vision changes. INR was 1.5 on admission, and patient had beem compliant with Lovenox bridging since discharge. CT head was negative, MRI showed patchy acute and subacute infarcts. Neurology was consulted and they recommended DOAC given stroke after being on warfarin/Lovenox. Deficits had resolved and he was discharged home, weight was 254 lbs. ?  ?He was last seen with APP for for post hospital HF follow up with his mother on 09/13/2021. He reported  overall feeling fine. Denied dyspnea with activity or if he walked up steps.  Denied palpitations, abnormal bleeding, CP, dizziness, edema, or PND/Orthopnea. Appetite was ok. Denied fever or chills. Reported weight at home was 246.6 pounds. Reported compliance with all medications. LifeVest was not worn at this visit, he reported vest made him break out under left breast. Stated he stopped smoking. Reported drinking ETOH 1-2 drinks/day. 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 Dr. Virgina Jock advised no driving. ? ?On 10/03/2021, he started the first cycle of neoadjuvant chemotherapy with Taxotere and Cytoxan. He gets regular labs at the cancer center. ? ? ?Today he returns to HF clinic for pharmacist medication titration. At last visit with APP, K was elevated, so he was instructed to hold eplerenone for 1 day then reduce dose to 25 mg daily.  F/u K has since been WNL. Reports he has recently started chemotherapy but has overall been feeling well and in good spirits. Denies dizziness, lightheadedness, fatigue. Denies chest pain and palpitations.  Reports his breathing has improved since his hospitalization.  No SOB/DOE on flat ground but does endorse SOB when walking up stairs. He states he walked 5 miles yesterday. Weight at home has been stable at 250 lbs. (Weight in clinic is higher today d/t steel toe boots.) No signs of fluid overload during visit. Denies LEE, PND, orthopnea. Appetite ok. Adheres to a low salt diet.  ? ?HF Medications: ?Metoprolol succinate 25 mg daily ?Entresto 24-26 mg BID  ?Eplerenone 25 mg daily ?Farxiga 10 mg daily ?Corlanor 5 mg BID ?Digoxin 0.125 daily ? ?Has the patient been experiencing any side  effects to the medications prescribed?  No ? ?Does the patient have any problems obtaining medications due to transportation or finances?   No; [employer] - given copay cards for [place]  today ? ?Understanding of regimen: good ?Understanding of indications: good ?Potential of compliance: good ?Patient understands to avoid NSAIDs. ?Patient understands to avoid decongestants. ?  ? ?Pertinent Lab Values: ?10/03/2021 - Serum creatinine 0.74, BUN 13, Potassium 4.3, Sodium 139 ? ?Vital Signs: ?Weight: 262.6 lbs (last clinic weight: 253 lbs.) ?Blood pressure: 122/78  ?Heart rate: 73  ? ?Assessment/Plan: ?1. Chronic Systolic CHF: Patient presented with CHF after a viral-type illness. HIV, TSH, COVID-19 test, and UDS were all negative. Likely not ETOH-induced CMP. No family history of cardiomyopathy.  LHC/RHC showed no significant CAD, elevated filling pressures and relatively preserved cardiac output. Echo showed EF 20-25%.  Cardiac MRI was done 08/2021, showing severe LV dilation with EF 10%, LV thrombus, mildly dilated RV with EF 15%.  Delayed enhancement imaging showed inferolateral LGE that could be a OM territory infarct, but corresponding lesion was not seen by cath. Overall, most likely acute myocarditis given subepicardial apical inferior LGE also (the RV insertion site LGE can be nonspecific in the setting of volume/pressure overload). Elevated T2 in the apical inferior wall was more suggestive of acute myocarditis. 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  ?-Stable NYHA II, he is not volume overloaded on exam. ?- He does not need a daily loop diuretic.  ?- Continue metoprolol succinate 25 mg daily.   ?- Increase Entresto to 49-51 mg BID.  He gets regular labs including CMET at the cancer center. Monitor renal function and K.  ?- Continue eplerenone 25 mg daily.  ?- Continue Farxiga 10 mg daily. ?- Continue Corlanor 5 mg BID.  ?- Continue digoxin 0.125 mg daily.  ?- Will repeat echo at 3 months to see if he needs ICD.  ? ?2. LV thrombus: Previously on Coumadin, now on DOAC with new CVA. ?- Continue Eliquis.  ? ?3. CVA: Multifocal strokes  secondary to LV apical thrombus, with mild petechial hemorrhage while on Lovenox/warfarin. Now on Eliquis 5 mg BID. No deficits. ?- Followed by neurology. ? ?4. Syncope: Episode of non-arrhythmic syncope in the MRI, suspect this was vagally-mediated based on history.  BP stable. No further events. ? ?5. Breast mass:  Noted on left by imaging with axillary lymphadenopathy, left breast invasive ductal carcinoma. No breast pain or tenderness. Followed by oncology. ?- Continue eplerenone 25 mg daily. ?  ?Follow up in 3 weeks with APP and 9 weeks with Dr. Aundra Dubin + echo. ? ? ?Audry Riles, PharmD, BCPS, BCCP, CPP ?Heart Failure Clinic Pharmacist ?[phone] ? ? ? ?

## 2021-10-05 NOTE — Patient Instructions (Addendum)
It was a pleasure seeing you today! ? ?MEDICATIONS: ?-We are changing your medications today ?--Increase Entresto to 49/51 mg (1 tablet) twice daily. You may take 2 tablets of the 24/26 mg strength twice daily until you receive the new strength.  ?-Call if you have questions about your medications. ? ?NEXT APPOINTMENT: ?Return to clinic in 3 weeks with APP Clinic. ? ?In general, to take care of your heart failure: ?-Limit your fluid intake to 2 Liters (half-gallon) per day.   ?-Limit your salt intake to ideally 2-3 grams (2000-3000 mg) per day. ?-Weigh yourself daily and record, and bring that "weight diary" to your next appointment.  (Weight gain of 2-3 pounds in 1 day typically means fluid weight.) ?-The medications for your heart are to help your heart and help you live longer.   ?-Please contact us before stopping any of your heart medications. ? ?Call the clinic at [phone] with questions or to reschedule future appointments.  ?

## 2021-10-09 ENCOUNTER — Other Ambulatory Visit (HOSPITAL_COMMUNITY): Payer: Self-pay

## 2021-10-09 NOTE — Progress Notes (Signed)
? ?Patient Care Team: ?Pcp, No as PCP - General ?Mauro Kaufmann, RN as Oncology Nurse Navigator ?Rockwell Germany, RN as Oncology Nurse Navigator ?Elease Hashimoto (Neurology) ? ?DIAGNOSIS:  ?Encounter Diagnosis  ?Name Primary?  ? 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, estrogen receptor positive ([place])   ? ? 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 Palpable left breast mass x8 months. CT 08/27/2021 in ED showed 3.8 ?centimeter left breast mass with left axillary lymph nodes.Mamm and [place]: 4 cm mass with 5 axillary lymph nodes: Biopsy: Grade 2 IDC, lymph node: Positive, ER 100%, PR 50%, Ki-67 25%, HER2 negative ratio 1.25, copy #3.45 ?  ?09/25/2021 Cancer Staging  ? Staging form: Breast, AJCC 8th Edition ?- Clinical: Stage IIA (cT2, cN1, cM0, G2, ER+, PR+, HER2-) - Signed by Nicholas Lose, MD on 09/25/2021 ?Histologic grading system: 3 grade system ? ?  ?10/03/2021 -  Chemotherapy  ? Patient is on Treatment Plan : BREAST TC q21d  ?   ? ? ?CHIEF COMPLIANT: Cycle 2 Taxotere and ctotoxin ? ?INTERVAL HISTORY: Michael Stuart is a  41 y.o. male is here because of recent diagnosis of left breast invasive ductal carcinoma. He presents to the clinic today for Cycle 2 Taxotere and cytoxan. He walked 5 miles after he had his chemo. He complains of smelling metal and muscle aches and his bones hurt about 3 days after the Neulasta injection. He complains of flu symptoms. Also complains of mouth sores and loss of his voice.  He also had intense pain in the left breast for couple of days after chemo. ? ? ?ALLERGIES:  is allergic to bactrim [sulfamethoxazole-trimethoprim]. ? ?MEDICATIONS:  ?Current Outpatient Medications  ?Medication Sig Dispense Refill  ? magic mouthwash w/lidocaine SOLN Take 5 mLs by mouth 3 (three) times daily as needed for mouth pain. 120 mL  0  ? acetaminophen (TYLENOL) 500 MG tablet Take 1,000 mg by mouth every 6 (six) hours as needed for moderate pain.    ? antiseptic oral rinse (BIOTENE) LIQD 15 mLs by Mouth Rinse route as needed for dry mouth.    ? apixaban (ELIQUIS) 5 MG TABS tablet Take 1 tablet (5 mg total) by mouth 2 (two) times daily. 180 tablet 1  ? atorvastatin (LIPITOR) 20 MG tablet Take 1 tablet (20 mg total) by mouth daily. 30 tablet 3  ? dapagliflozin propanediol (FARXIGA) 10 MG TABS tablet Take 1 tablet (10 mg total) by mouth daily. 30 tablet 2  ? dexamethasone (DECADRON) 4 MG tablet Take 1 tablet (4 mg total) by mouth daily. Take 1 tablet day before chemo and 1 tablet day after chemo with food 12 tablet 0  ? digoxin (LANOXIN) 0.125 MG tablet Take 1 tablet (0.125 mg total) by mouth daily. 30 tablet 2  ? eplerenone (INSPRA) 25 MG tablet Take 2 tablets (50 mg total) by mouth daily. (Patient taking differently: Take 25 mg by mouth daily.) 60 tablet 2  ? ivabradine (CORLANOR) 5 MG TABS tablet Take 1 tablet (5 mg total) by mouth 2 (two) times daily with a meal. 60 tablet 2  ? lidocaine-prilocaine (EMLA) cream Apply to affected area once daily as directed 30 g 2  ? LORazepam (ATIVAN) 0.5 MG tablet Take 1 tablet (0.5 mg total) by mouth every 6 (six) hours as needed (Nausea or vomiting). 30 tablet 0  ?  metoprolol succinate (TOPROL-XL) 25 MG 24 hr tablet Take 1 tablet (25 mg total) by mouth daily. 30 tablet 2  ? ondansetron (ZOFRAN) 8 MG tablet Take 1 tablet (8 mg total) by mouth 2 (two) times daily as needed for refractory nausea / vomiting. Start on day 3 after chemo. 30 tablet 0  ? prochlorperazine (COMPAZINE) 10 MG tablet Take 1 tablet (10 mg total) by mouth every 6 (six) hours as needed (Nausea or vomiting). 30 tablet 0  ? sacubitril-valsartan (ENTRESTO) 49-51 MG Take 1 tablet by mouth 2 (two) times daily. 60 tablet 11  ? ?No current facility-administered medications for this visit.  ? ? ?PHYSICAL EXAMINATION: ?ECOG PERFORMANCE STATUS: 1 -  Symptomatic but completely ambulatory ? ?Vitals:  ? 10/10/21 1149  ?BP: 105/68  ?Pulse: (!) 106  ?Resp: 19  ?Temp: 97.7 ?F (36.5 ?C)  ?SpO2: 100%  ? ?Filed Weights  ? 10/10/21 1149  ?Weight: 251 lb (113.9 kg)  ? ?  ? ?LABORATORY DATA:  ?I have reviewed the data as listed ? ?  Latest Ref Rng & Units 10/03/2021  ? 11:07 AM 09/20/2021  ?  9:38 AM 09/13/2021  ? 10:11 AM  ?CMP  ?Glucose 70 - 99 mg/dL 99   103   96    ?BUN 6 - 20 mg/dL 13   13   10     ?Creatinine 0.61 - 1.24 mg/dL 0.74   0.98   1.10    ?Sodium 135 - 145 mmol/L 139   140   141    ?Potassium 3.5 - 5.1 mmol/L 4.3   5.0   5.2    ?Chloride 98 - 111 mmol/L 107   105   107    ?CO2 22 - 32 mmol/L 26   26   25     ?Calcium 8.9 - 10.3 mg/dL 9.7   9.5   10.0    ?Total Protein 6.5 - 8.1 g/dL 7.5      ?Total Bilirubin 0.3 - 1.2 mg/dL 0.4      ?Alkaline Phos 38 - 126 U/L 64      ?AST 15 - 41 U/L 17      ?ALT 0 - 44 U/L 18      ? ? ?Lab Results  ?Component Value Date  ? WBC 16.5 (H) 10/10/2021  ? HGB 14.3 10/10/2021  ? HCT 41.1 10/10/2021  ? MCV 88.2 10/10/2021  ? PLT 242 10/10/2021  ? NEUTROABS PENDING 10/10/2021  ? ? 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 CT 08/27/2021 in ED showed 3.8 cm left breast mass with left axillary lymph nodes.Mamm and [place]: 4 cm mass with 5 axillary lymph nodes: Biopsy: Grade 2 IDC, lymph node: Positive, ER 100%, PR 50%, Ki-67 25%, HER2 negative ratio 1.25, copy #3.45 ?(Recent history of coronary artery disease EF 22% and stroke) ?  ?Treatment plan: ?1. Neoadjuvant chemotherapy with Taxotere and Cytoxan every 3 weeks x6 cycles ?2. Followed by breast conserving surgery with targeted axillary dissection vs ALND ?3. Followed by adjuvant radiation therapy ?4.  Followed by adjuvant antiestrogen therapy with CDK 4 and 6 inhibitor ?CT CAP and bone scan: No distant metastatic  disease ?------------------------------------------------------------------------------------------------------------------------------------------ ?Current treatment: Cycle 1 day 8 Taxotere Cytoxan ?Chemo toxicities: ?1.  Diffuse body pain due to Neulasta: With the next treatment I recommended that he take Motrin ?2. mouth sores: We will give Magic mouthwash ?3.  Sore  throat: I also and encouraged him to chew on ice during chemotherapy ? ?Did not have any nausea vomiting or diarrhea or constipation. ?Labs reviewed and they do not show any evidence of neutropenia.  Therefore we will keep the dosage the same. ? ?Return to clinic in 2 weeks for cycle 2 ? ? ? ?No orders of the defined types were placed in this encounter. ? ?The patient has a good understanding of the overall plan. he agrees with it. he will call with any problems that may develop before the next visit here. ?Total time spent: 30 mins including face to face time and time spent for planning, charting and co-ordination of care ? ? Harriette Ohara, MD ?10/10/21 ? ? ? I Gardiner Coins am scribing for Dr. Lindi Adie ? ?I have reviewed the above documentation for accuracy and completeness, and I agree with the above. ?. ?

## 2021-10-10 ENCOUNTER — Other Ambulatory Visit: Payer: Self-pay

## 2021-10-10 ENCOUNTER — Inpatient Hospital Stay: Payer: BC Managed Care – PPO | Attending: Hematology and Oncology

## 2021-10-10 ENCOUNTER — Encounter: Payer: Self-pay | Admitting: *Deleted

## 2021-10-10 ENCOUNTER — Inpatient Hospital Stay: Payer: BC Managed Care – PPO | Admitting: Hematology and Oncology

## 2021-10-10 DIAGNOSIS — Z170 Estrogen receptor positive status [ER+]: Secondary | ICD-10-CM | POA: Diagnosis not present

## 2021-10-10 DIAGNOSIS — Z95828 Presence of other vascular implants and grafts: Secondary | ICD-10-CM

## 2021-10-10 DIAGNOSIS — Z5189 Encounter for other specified aftercare: Secondary | ICD-10-CM | POA: Insufficient documentation

## 2021-10-10 DIAGNOSIS — C50422 Malignant neoplasm of upper-outer quadrant of left male breast: Secondary | ICD-10-CM

## 2021-10-10 DIAGNOSIS — C773 Secondary and unspecified malignant neoplasm of axilla and upper limb lymph nodes: Secondary | ICD-10-CM | POA: Diagnosis not present

## 2021-10-10 DIAGNOSIS — C50412 Malignant neoplasm of upper-outer quadrant of left female breast: Secondary | ICD-10-CM

## 2021-10-10 DIAGNOSIS — Z79899 Other long term (current) drug therapy: Secondary | ICD-10-CM | POA: Diagnosis not present

## 2021-10-10 DIAGNOSIS — Z5111 Encounter for antineoplastic chemotherapy: Secondary | ICD-10-CM | POA: Diagnosis not present

## 2021-10-10 LAB — CMP (CANCER CENTER ONLY)
ALT: 30 U/L (ref 0–44)
AST: 26 U/L (ref 15–41)
Albumin: 4 g/dL (ref 3.5–5.0)
Alkaline Phosphatase: 72 U/L (ref 38–126)
Anion gap: 8 (ref 5–15)
BUN: 9 mg/dL (ref 6–20)
CO2: 26 mmol/L (ref 22–32)
Calcium: 9.4 mg/dL (ref 8.9–10.3)
Chloride: 102 mmol/L (ref 98–111)
Creatinine: 0.72 mg/dL (ref 0.61–1.24)
GFR, Estimated: >60 mL/min (ref >60)
Glucose, Bld: 105 mg/dL — ABNORMAL HIGH (ref 70–99)
Potassium: 4.3 mmol/L (ref 3.5–5.1)
Sodium: 136 mmol/L (ref 135–145)
Total Bilirubin: 0.4 mg/dL (ref 0.3–1.2)
Total Protein: 7.4 g/dL (ref 6.5–8.1)

## 2021-10-10 LAB — CBC WITH DIFFERENTIAL (CANCER CENTER ONLY)
Abs Immature Granulocytes: 2.75 10*3/uL — ABNORMAL HIGH (ref 0.00–0.07)
Basophils Absolute: 0.1 10*3/uL (ref 0.0–0.1)
Basophils Relative: 0 %
Eosinophils Absolute: 0.2 10*3/uL (ref 0.0–0.5)
Eosinophils Relative: 1 %
HCT: 41.1 % (ref 39.0–52.0)
Hemoglobin: 14.3 g/dL (ref 13.0–17.0)
Immature Granulocytes: 17 %
Lymphocytes Relative: 16 %
Lymphs Abs: 2.7 10*3/uL (ref 0.7–4.0)
MCH: 30.7 pg (ref 26.0–34.0)
MCHC: 34.8 g/dL (ref 30.0–36.0)
MCV: 88.2 fL (ref 80.0–100.0)
Monocytes Absolute: 4.5 10*3/uL — ABNORMAL HIGH (ref 0.1–1.0)
Monocytes Relative: 28 %
Neutro Abs: 6.3 10*3/uL (ref 1.7–7.7)
Neutrophils Relative %: 38 %
Platelet Count: 242 10*3/uL (ref 150–400)
RBC: 4.66 MIL/uL (ref 4.22–5.81)
RDW: 12.6 % (ref 11.5–15.5)
Smear Review: NORMAL
WBC Count: 16.5 10*3/uL — ABNORMAL HIGH (ref 4.0–10.5)
nRBC: 0 % (ref 0.0–0.2)

## 2021-10-10 MED ORDER — HEPARIN SOD (PORK) LOCK FLUSH 100 UNIT/ML IV SOLN
500.0000 [IU] | Freq: Once | INTRAVENOUS | Status: AC
  Administered 2021-10-10: 500 [IU]

## 2021-10-10 MED ORDER — MAGIC MOUTHWASH W/LIDOCAINE: 5.0000 mL | Freq: Three times a day (TID) | ORAL | 0 refills | Status: DC | PRN

## 2021-10-10 MED ORDER — SODIUM CHLORIDE 0.9% FLUSH
10.0000 mL | Freq: Once | INTRAVENOUS | Status: AC
  Administered 2021-10-10: 10 mL

## 2021-10-10 NOTE — Assessment & Plan Note (Addendum)
09/15/2021:Palpable left breast mass x8 months. CT 08/27/2021 in ED showed 3.8?cm?left breast mass with left axillary lymph nodes.Mamm and [place]: 4 cm mass with 5 axillary lymph nodes: Biopsy: Grade 2 IDC, lymph node: Positive, ER 100%, PR 50%, Ki-67 25%, HER2 negative ratio 1.25, copy #3.45 ?(Recent history of coronary artery disease EF 22% and stroke) ?? ?Treatment plan: ?1. Neoadjuvant chemotherapy with?Taxotere and Cytoxan every 3 weeks x6 cycles ?2. Followed by breast conserving surgery with targeted axillary dissection?vs ALND ?3. Followed by adjuvant radiation therapy ?4.??Followed by adjuvant antiestrogen therapy with CDK 4 and 6 inhibitor ?CT CAP and bone scan: No distant metastatic disease ?------------------------------------------------------------------------------------------------------------------------------------------ ?Current treatment: Cycle 1 day 8 Taxotere Cytoxan ?Chemo toxicities: ?1.  Diffuse body pain due to Neulasta: With the next treatment I recommended that he take Motrin ?2. mouth sores: We will give Magic mouthwash ?3.  Sore throat: I also and encouraged him to chew on ice during chemotherapy ? ?Did not have any nausea vomiting or diarrhea or constipation. ?Labs reviewed and they do not show any evidence of neutropenia.  Therefore we will keep the dosage the same. ? ?Return to clinic in 2 weeks for cycle 2 ?

## 2021-10-11 ENCOUNTER — Telehealth: Payer: Self-pay | Admitting: *Deleted

## 2021-10-11 NOTE — Telephone Encounter (Signed)
Addition to previous encounter: pt denies fever at this time and educated to contact the office if fever does develop.  ?

## 2021-10-11 NOTE — Telephone Encounter (Signed)
Received call from pt with complaint of sore throat and post nasal drainage x2 days.  Per MD pt to utilize magic mouthwash by gargling TID PRN as well as taking OTC Mucinex DM to alleviate post nasal drainage.  MD states if symptoms do not dissipate, pt will need to be seen in [facility] prior to the weekend.  Pt verbalized understanding.  ?

## 2021-10-11 NOTE — Progress Notes (Signed)
? ?Patient Care Team: ?Pcp, No as PCP - General ?Mauro Kaufmann, RN as Oncology Nurse Navigator ?Rockwell Germany, RN as Oncology Nurse Navigator ?Elease Hashimoto (Neurology) ? ?DIAGNOSIS:  ?Encounter Diagnosis  ?Name Primary?  ? 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 ? 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 Palpable left breast mass x8 months. CT 08/27/2021 in ED showed 3.8 ?centimeter left breast mass with left axillary lymph nodes.Mamm and [place]: 4 cm mass with 5 axillary lymph nodes: Biopsy: Grade 2 IDC, lymph node: Positive, ER 100%, PR 50%, Ki-67 25%, HER2 negative ratio 1.25, copy #3.45 ?  ?09/25/2021 Cancer Staging  ? Staging form: Breast, AJCC 8th Edition ?- Clinical: Stage IIA (cT2, cN1, cM0, G2, ER+, PR+, HER2-) - Signed by Nicholas Lose, MD on 09/25/2021 ?Histologic grading system: 3 grade system ? ?  ?10/03/2021 -  Chemotherapy  ? Patient is on Treatment Plan : BREAST TC q21d  ? ?  ?  ? ? ?CHIEF COMPLIANT: Cycle 2 Taxotere and cyotoxan ? ?INTERVAL HISTORY: Michael Stuart is a 41 y.o. male is here because of recent diagnosis of left breast invasive ductal carcinoma. He presents to the clinic today for Cycle 2 Taxotere and cytoxan. He presents to the clinic today for a follow-up. He states he is tolerating Taxotere and cyotoxan. He complains of a surgery pain from the knot, but its better now. Complains of upper respiratory infection from mowing the grass. ? ? ? ?ALLERGIES:  is allergic to bactrim [sulfamethoxazole-trimethoprim]. ? ?MEDICATIONS:  ?Current Outpatient Medications  ?Medication Sig Dispense Refill  ? acetaminophen (TYLENOL) 500 MG tablet Take 1,000 mg by mouth every 6 (six) hours as needed for moderate pain.    ? antiseptic oral rinse (BIOTENE) LIQD 15 mLs by Mouth Rinse route as  needed for dry mouth.    ? apixaban (ELIQUIS) 5 MG TABS tablet Take 1 tablet (5 mg total) by mouth 2 (two) times daily. 180 tablet 1  ? atorvastatin (LIPITOR) 20 MG tablet Take 1 tablet (20 mg total) by mouth daily. 30 tablet 3  ? dapagliflozin propanediol (FARXIGA) 10 MG TABS tablet Take 1 tablet (10 mg total) by mouth daily. 30 tablet 2  ? dexamethasone (DECADRON) 4 MG tablet Take 1 tablet (4 mg total) by mouth daily. Take 1 tablet day before chemo and 1 tablet day after chemo with food 12 tablet 0  ? digoxin (LANOXIN) 0.125 MG tablet Take 1 tablet (0.125 mg total) by mouth daily. 30 tablet 2  ? eplerenone (INSPRA) 25 MG tablet Take 2 tablets (50 mg total) by mouth daily. (Patient taking differently: Take 25 mg by mouth daily.) 60 tablet 2  ? ivabradine (CORLANOR) 5 MG TABS tablet Take 1 tablet (5 mg total) by mouth 2 (two) times daily with a meal. 60 tablet 2  ? lidocaine-prilocaine (EMLA) cream Apply to affected area once daily as directed 30 g 2  ? LORazepam (ATIVAN) 0.5 MG tablet Take 1 tablet (0.5 mg total) by mouth every 6 (six) hours as needed (Nausea or vomiting). 30 tablet 0  ? magic mouthwash w/lidocaine SOLN Take 5 mLs by mouth 3 (three) times daily as needed for mouth pain. 120 mL 0  ? metoprolol succinate (TOPROL-XL) 25 MG 24 hr tablet Take 1 tablet (25 mg total) by mouth daily.  30 tablet 2  ? ondansetron (ZOFRAN) 8 MG tablet Take 1 tablet (8 mg total) by mouth 2 (two) times daily as needed for refractory nausea / vomiting. Start on day 3 after chemo. 30 tablet 0  ? prochlorperazine (COMPAZINE) 10 MG tablet Take 1 tablet (10 mg total) by mouth every 6 (six) hours as needed (Nausea or vomiting). 30 tablet 0  ? sacubitril-valsartan (ENTRESTO) 49-51 MG Take 1 tablet by mouth 2 (two) times daily. 60 tablet 11  ? ?No current facility-administered medications for this visit.  ? ? ?PHYSICAL EXAMINATION: ?ECOG PERFORMANCE STATUS: 1 - Symptomatic but completely ambulatory ? ?Vitals:  ? 10/23/21 1056  ?BP: (!)  153/89  ?Pulse: 80  ?SpO2: 100%  ? ?Filed Weights  ? 10/23/21 1056  ?Weight: 257 lb 9.6 oz (116.8 kg)  ?  ? ?LABORATORY DATA:  ?I have reviewed the data as listed ? ?  Latest Ref Rng & Units 10/10/2021  ? 11:43 AM 10/03/2021  ? 11:07 AM 09/20/2021  ?  9:38 AM  ?CMP  ?Glucose 70 - 99 mg/dL 105   99   103    ?BUN 6 - 20 mg/dL 9   13   13     ?Creatinine 0.61 - 1.24 mg/dL 0.72   0.74   0.98    ?Sodium 135 - 145 mmol/L 136   139   140    ?Potassium 3.5 - 5.1 mmol/L 4.3   4.3   5.0    ?Chloride 98 - 111 mmol/L 102   107   105    ?CO2 22 - 32 mmol/L 26   26   26     ?Calcium 8.9 - 10.3 mg/dL 9.4   9.7   9.5    ?Total Protein 6.5 - 8.1 g/dL 7.4   7.5     ?Total Bilirubin 0.3 - 1.2 mg/dL 0.4   0.4     ?Alkaline Phos 38 - 126 U/L 72   64     ?AST 15 - 41 U/L 26   17     ?ALT 0 - 44 U/L 30   18     ? ? ?Lab Results  ?Component Value Date  ? WBC 12.4 (H) 10/23/2021  ? HGB 13.5 10/23/2021  ? HCT 39.6 10/23/2021  ? MCV 88.8 10/23/2021  ? PLT 376 10/23/2021  ? NEUTROABS 7.4 10/23/2021  ? ? 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 CT 08/27/2021 in ED showed 3.8 cm left breast mass with left axillary lymph nodes.Mamm and [place]: 4 cm mass with 5 axillary lymph nodes: Biopsy: Grade 2 IDC, lymph node: Positive, ER 100%, PR 50%, Ki-67 25%, HER2 negative ratio 1.25, copy #3.45 ?(Recent history of coronary artery disease EF 22% and stroke) ?  ?Treatment plan: ?1. Neoadjuvant chemotherapy with Taxotere and Cytoxan every 3 weeks x6 cycles ?2. Followed by breast conserving surgery with targeted axillary dissection vs ALND ?3. Followed by adjuvant radiation therapy ?4.  Followed by adjuvant antiestrogen therapy with CDK 4 and 6 inhibitor ?CT CAP and bone scan: No distant metastatic  disease ?------------------------------------------------------------------------------------------------------------------------------------------ ?Current treatment: Cycle 2 Taxotere Cytoxan ?Chemo toxicities: ?1.  Diffuse body pain due to Neulasta: With the next treatment I recommended that he take Motrin ?2. mouth sores: No further mouth sores ?3.  Sore throat: Magic mouthwash resolved his sore throat ?4.  Hair loss as expected ?  ?Did not have  any nausea vomiting or diarrhea or constipation. ?  ?  ?Return to clinic in 3 weeks for cycle 3 ? ? ? ?No orders of the defined types were placed in this encounter. ? ?The patient has a good understanding of the overall plan. he agrees with it. he will call with any problems that may develop before the next visit here. ?Total time spent: 30 mins including face to face time and time spent for planning, charting and co-ordination of care ? ? Harriette Ohara, MD ?10/23/21 ? ?I Gardiner Coins am scribing for Dr. Lindi Adie ? ?I have reviewed the above documentation for accuracy and completeness, and I agree with the above. ?  ?  ?

## 2021-10-13 ENCOUNTER — Inpatient Hospital Stay: Payer: BC Managed Care – PPO | Admitting: Adult Health

## 2021-10-23 ENCOUNTER — Inpatient Hospital Stay: Payer: BC Managed Care – PPO

## 2021-10-23 ENCOUNTER — Inpatient Hospital Stay: Payer: BC Managed Care – PPO | Admitting: Hematology and Oncology

## 2021-10-23 ENCOUNTER — Other Ambulatory Visit: Payer: Self-pay

## 2021-10-23 ENCOUNTER — Encounter: Payer: Self-pay | Admitting: Hematology and Oncology

## 2021-10-23 ENCOUNTER — Other Ambulatory Visit (HOSPITAL_COMMUNITY): Payer: Self-pay

## 2021-10-23 DIAGNOSIS — Z170 Estrogen receptor positive status [ER+]: Secondary | ICD-10-CM | POA: Diagnosis not present

## 2021-10-23 DIAGNOSIS — C50422 Malignant neoplasm of upper-outer quadrant of left male breast: Secondary | ICD-10-CM

## 2021-10-23 DIAGNOSIS — C50412 Malignant neoplasm of upper-outer quadrant of left female breast: Secondary | ICD-10-CM

## 2021-10-23 DIAGNOSIS — Z5189 Encounter for other specified aftercare: Secondary | ICD-10-CM | POA: Diagnosis not present

## 2021-10-23 DIAGNOSIS — Z5111 Encounter for antineoplastic chemotherapy: Secondary | ICD-10-CM | POA: Diagnosis not present

## 2021-10-23 DIAGNOSIS — C773 Secondary and unspecified malignant neoplasm of axilla and upper limb lymph nodes: Secondary | ICD-10-CM | POA: Diagnosis not present

## 2021-10-23 DIAGNOSIS — Z79899 Other long term (current) drug therapy: Secondary | ICD-10-CM | POA: Diagnosis not present

## 2021-10-23 DIAGNOSIS — Z95828 Presence of other vascular implants and grafts: Secondary | ICD-10-CM

## 2021-10-23 LAB — CMP (CANCER CENTER ONLY)
ALT: 19 U/L (ref 0–44)
AST: 16 U/L (ref 15–41)
Albumin: 4.1 g/dL (ref 3.5–5.0)
Alkaline Phosphatase: 56 U/L (ref 38–126)
Anion gap: 8 (ref 5–15)
BUN: 15 mg/dL (ref 6–20)
CO2: 25 mmol/L (ref 22–32)
Calcium: 9.4 mg/dL (ref 8.9–10.3)
Chloride: 104 mmol/L (ref 98–111)
Creatinine: 0.8 mg/dL (ref 0.61–1.24)
GFR, Estimated: >60 mL/min (ref >60)
Glucose, Bld: 100 mg/dL — ABNORMAL HIGH (ref 70–99)
Potassium: 4 mmol/L (ref 3.5–5.1)
Sodium: 137 mmol/L (ref 135–145)
Total Bilirubin: 0.3 mg/dL (ref 0.3–1.2)
Total Protein: 7.3 g/dL (ref 6.5–8.1)

## 2021-10-23 LAB — CBC WITH DIFFERENTIAL (CANCER CENTER ONLY)
Abs Immature Granulocytes: 0.05 10*3/uL (ref 0.00–0.07)
Basophils Absolute: 0.1 10*3/uL (ref 0.0–0.1)
Basophils Relative: 1 %
Eosinophils Absolute: 0 10*3/uL (ref 0.0–0.5)
Eosinophils Relative: 0 %
HCT: 39.6 % (ref 39.0–52.0)
Hemoglobin: 13.5 g/dL (ref 13.0–17.0)
Immature Granulocytes: 0 %
Lymphocytes Relative: 29 %
Lymphs Abs: 3.5 10*3/uL (ref 0.7–4.0)
MCH: 30.3 pg (ref 26.0–34.0)
MCHC: 34.1 g/dL (ref 30.0–36.0)
MCV: 88.8 fL (ref 80.0–100.0)
Monocytes Absolute: 1.3 10*3/uL — ABNORMAL HIGH (ref 0.1–1.0)
Monocytes Relative: 10 %
Neutro Abs: 7.4 10*3/uL (ref 1.7–7.7)
Neutrophils Relative %: 60 %
Platelet Count: 376 10*3/uL (ref 150–400)
RBC: 4.46 MIL/uL (ref 4.22–5.81)
RDW: 13.9 % (ref 11.5–15.5)
WBC Count: 12.4 10*3/uL — ABNORMAL HIGH (ref 4.0–10.5)
nRBC: 0 % (ref 0.0–0.2)

## 2021-10-23 MED ORDER — SODIUM CHLORIDE 0.9 % IV SOLN
75.0000 mg/m2 | Freq: Once | INTRAVENOUS | Status: AC
  Administered 2021-10-23: 190 mg via INTRAVENOUS
  Filled 2021-10-23: qty 19

## 2021-10-23 MED ORDER — SODIUM CHLORIDE 0.9 % IV SOLN
150.0000 mg | Freq: Once | INTRAVENOUS | Status: AC
  Administered 2021-10-23: 150 mg via INTRAVENOUS
  Filled 2021-10-23: qty 150

## 2021-10-23 MED ORDER — SODIUM CHLORIDE 0.9% FLUSH
10.0000 mL | INTRAVENOUS | Status: DC | PRN
  Administered 2021-10-23: 10 mL

## 2021-10-23 MED ORDER — HEPARIN SOD (PORK) LOCK FLUSH 100 UNIT/ML IV SOLN
500.0000 [IU] | Freq: Once | INTRAVENOUS | Status: AC | PRN
  Administered 2021-10-23: 500 [IU]

## 2021-10-23 MED ORDER — SODIUM CHLORIDE 0.9% FLUSH
10.0000 mL | Freq: Once | INTRAVENOUS | Status: AC
  Administered 2021-10-23: 10 mL

## 2021-10-23 MED ORDER — SODIUM CHLORIDE 0.9 % IV SOLN
10.0000 mg | Freq: Once | INTRAVENOUS | Status: AC
  Administered 2021-10-23: 10 mg via INTRAVENOUS
  Filled 2021-10-23: qty 10

## 2021-10-23 MED ORDER — SODIUM CHLORIDE 0.9 % IV SOLN
600.0000 mg/m2 | Freq: Once | INTRAVENOUS | Status: AC
  Administered 2021-10-23: 1480 mg via INTRAVENOUS
  Filled 2021-10-23: qty 74

## 2021-10-23 MED ORDER — SODIUM CHLORIDE 0.9 % IV SOLN: Freq: Once | INTRAVENOUS | Status: AC

## 2021-10-23 MED ORDER — PALONOSETRON HCL INJECTION 0.25 MG/5ML
0.2500 mg | Freq: Once | INTRAVENOUS | Status: AC
  Administered 2021-10-23: 0.25 mg via INTRAVENOUS
  Filled 2021-10-23: qty 5

## 2021-10-23 NOTE — Patient Instructions (Signed)
[facility]  Discharge Instructions: ?Thank you for choosing [facility] to provide your oncology and hematology care.  ? ?If you have a lab appointment with the [facility], please go directly to the [facility] and check in at the registration area. ?  ?Wear comfortable clothing and clothing appropriate for easy access to any Portacath or PICC line.  ? ?We strive to give you quality time with your provider. You may need to reschedule your appointment if you arrive late (15 or more minutes).  Arriving late affects you and other patients whose appointments are after yours.  Also, if you miss three or more appointments without notifying the office, you may be dismissed from the clinic at the provider?s discretion.    ?  ?For prescription refill requests, have your pharmacy contact our office and allow 72 hours for refills to be completed.   ? ?Today you received the following chemotherapy and/or immunotherapy agents: Docetaxel and Cytoxan ?  ?To help prevent nausea and vomiting after your treatment, we encourage you to take your nausea medication as directed. ? ?BELOW ARE SYMPTOMS THAT SHOULD BE REPORTED IMMEDIATELY: ?*FEVER GREATER THAN 100.4 F (38 ?C) OR HIGHER ?*CHILLS OR SWEATING ?*NAUSEA AND VOMITING THAT IS NOT CONTROLLED WITH YOUR NAUSEA MEDICATION ?*UNUSUAL SHORTNESS OF BREATH ?*UNUSUAL BRUISING OR BLEEDING ?*URINARY PROBLEMS (pain or burning when urinating, or frequent urination) ?*BOWEL PROBLEMS (unusual diarrhea, constipation, pain near the anus) ?TENDERNESS IN MOUTH AND THROAT WITH OR WITHOUT PRESENCE OF ULCERS (sore throat, sores in mouth, or a toothache) ?UNUSUAL RASH, SWELLING OR PAIN  ?UNUSUAL VAGINAL DISCHARGE OR ITCHING  ? ?Items with * indicate a potential emergency and should be followed up as soon as possible or go to the Emergency Department if any problems should occur. ? ?Please show the CHEMOTHERAPY ALERT CARD or IMMUNOTHERAPY ALERT CARD at  check-in to the Emergency Department and triage nurse. ? ?Should you have questions after your visit or need to cancel or reschedule your appointment, please contact [facility]  Dept: [phone]  and follow the prompts.  Office hours are 8:00 a.m. to 4:30 p.m. Monday - Friday. Please note that voicemails left after 4:00 p.m. may not be returned until the following business day.  We are closed weekends and major holidays. You have access to a nurse at all times for urgent questions. Please call the main number to the clinic Dept: [phone] and follow the prompts. ? ? ?For any non-urgent questions, you may also contact your provider using MyChart. We now offer e-Visits for anyone 63 and older to request care online for non-urgent symptoms. For details visit [email or web address]. ?  ?Also download the MyChart app! Go to the app store, search "MyChart", open the app, select [facility], and log in with your MyChart username and password. ? ?Due to Covid, a mask is required upon entering the hospital/clinic. If you do not have a mask, one will be given to you upon arrival. For doctor visits, patients may have 1 support person aged 73 or older with them. For treatment visits, patients cannot have anyone with them due to current Covid guidelines and our immunocompromised population.  ? ?Docetaxel injection ?What is this medication? ?DOCETAXEL (doe se TAX el) is a chemotherapy drug. It targets fast dividing cells, like cancer cells, and causes these cells to die. This medicine is used to treat many types of cancers like breast cancer, certain stomach cancers, head and neck cancer, lung  cancer, and prostate cancer. ?This medicine may be used for other purposes; ask your health care provider or pharmacist if you have questions. ?COMMON BRAND NAME(S): Docefrez, Taxotere ?What should I tell my care team before I take this medication? ?They need to know if you have any of these  conditions: ?infection (especially a virus infection such as chickenpox, cold sores, or herpes) ?liver disease ?low blood counts, like low white cell, platelet, or red cell counts ?an unusual or allergic reaction to docetaxel, polysorbate 80, other chemotherapy agents, other medicines, foods, dyes, or preservatives ?pregnant or trying to get pregnant ?breast-feeding ?How should I use this medication? ?This drug is given as an infusion into a vein. It is administered in a hospital or clinic by a specially trained health care professional. ?Talk to your pediatrician regarding the use of this medicine in children. Special care may be needed. ?Overdosage: If you think you have taken too much of this medicine contact a poison control center or emergency room at once. ?NOTE: This medicine is only for you. Do not share this medicine with others. ?What if I miss a dose? ?It is important not to miss your dose. Call your doctor or health care professional if you are unable to keep an appointment. ?What may interact with this medication? ?Do not take this medicine with any of the following medications: ?live virus vaccines ?This medicine may also interact with the following medications: ?aprepitant ?certain antibiotics like erythromycin or clarithromycin ?certain antivirals for HIV or hepatitis ?certain medicines for fungal infections like fluconazole, itraconazole, ketoconazole, posaconazole, or voriconazole ?cimetidine ?ciprofloxacin ?conivaptan ?cyclosporine ?dronedarone ?fluvoxamine ?grapefruit juice ?imatinib ?verapamil ?This list may not describe all possible interactions. Give your health care provider a list of all the medicines, herbs, non-prescription drugs, or dietary supplements you use. Also tell them if you smoke, drink alcohol, or use illegal drugs. Some items may interact with your medicine. ?What should I watch for while using this medication? ?Your condition will be monitored carefully while you are receiving  this medicine. You will need important blood work done while you are taking this medicine. ?Call your doctor or health care professional for advice if you get a fever, chills or sore throat, or other symptoms of a cold or flu. Do not treat yourself. This drug decreases your body's ability to fight infections. Try to avoid being around people who are sick. ?Some products may contain alcohol. Ask your health care professional if this medicine contains alcohol. Be sure to tell all health care professionals you are taking this medicine. Certain medicines, like metronidazole and disulfiram, can cause an unpleasant reaction when taken with alcohol. The reaction includes flushing, headache, nausea, vomiting, sweating, and increased thirst. The reaction can last from 30 minutes to several hours. ?You may get drowsy or dizzy. Do not drive, use machinery, or do anything that needs mental alertness until you know how this medicine affects you. Do not stand or sit up quickly, especially if you are an older patient. This reduces the risk of dizzy or fainting spells. Alcohol may interfere with the effect of this medicine. ?Talk to your health care professional about your risk of cancer. You may be more at risk for certain types of cancer if you take this medicine. ?Do not become pregnant while taking this medicine or for 6 months after stopping it. Women should inform their doctor if they wish to become pregnant or think they might be pregnant. There is a potential for serious side effects to an  unborn child. Talk to your health care professional or pharmacist for more information. Do not breast-feed an infant while taking this medicine or for 1 week after stopping it. ?Males who get this medicine must use a condom during sex with females who can get pregnant. If you get a woman pregnant, the baby could have birth defects. The baby could die before they are born. You will need to continue wearing a condom for 3 months after  stopping the medicine. Tell your health care provider right away if your partner becomes pregnant while you are taking this medicine. ?This may interfere with the ability to father a child. You should talk to your docto

## 2021-10-23 NOTE — Assessment & Plan Note (Signed)
09/15/2021:Palpable left breast mass x8 months. CT 08/27/2021 in ED showed 3.8?cm?left breast mass with left axillary lymph nodes.Mamm and [place]: 4 cm mass with 5 axillary lymph nodes: Biopsy: Grade 2 IDC, lymph node: Positive, ER 100%, PR 50%, Ki-67 25%, HER2 negative ratio 1.25, copy #3.45 ?(Recent history of coronary artery disease EF 22% and stroke) ?? ?Treatment plan: ?1. Neoadjuvant chemotherapy with?Taxotere and Cytoxan every 3 weeks x6 cycles ?2. Followed by breast conserving surgery with targeted axillary dissection?vs ALND ?3. Followed by adjuvant radiation therapy ?4.??Followed by adjuvant antiestrogen therapy with CDK 4 and 6 inhibitor ?CT CAP and bone scan: No distant metastatic disease ?------------------------------------------------------------------------------------------------------------------------------------------ ?Current treatment: Cycle 2 Taxotere Cytoxan ?Chemo toxicities: ?1.  Diffuse body pain due to Neulasta: With the next treatment I recommended that he take Motrin ?2. mouth sores: We will give Magic mouthwash ?3.  Sore throat: I also and encouraged him to chew on ice during chemotherapy ?? ?Did not have any nausea vomiting or diarrhea or constipation. ?Labs reviewed and they do not show any evidence of neutropenia.  Therefore we will keep the dosage the same. ?? ?Return to clinic in 3 weeks for cycle 3 ?

## 2021-10-24 NOTE — Progress Notes (Addendum)
? ?ADVANCED HF CLINIC NOTE ?Primary Care: Pcp, No ?General Cardiologist: Dr. Virgina Jock ?HF Cardiologist: Dr. Aundra Dubin ? ?HPI: ?Michael Stuart is a 41 y.o. male with minimal previous past medical history, and new diagnosis of systolic heart failure, LV thrombus and new diagnosis of breast cancer. ? ?Admitted 1/61 with new systolic heart failure. Echo showed dilated LV with EF 20-25%.  LHC/RHC was done, showing elevated filling pressures but relatively preserved cardiac index of 2.3.  There was no significant coronary disease. cMRI showed LV dilation with EF 10%, LV thrombus, mildly dilated RV with EF 15%.  Delayed enhancement imaging showed inferolateral LGE that could be a OM territory infarct, but corresponding lesion not seen by cath. Most likely acute myocarditis given subepicardial apical inferior LGE also (the RV insertion site LGE can be nonspecific in the setting of volume/pressure overload). Elevated T2 in the apical inferior wall is more suggestive of acute myocarditis. 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 He was started on Coumadin for thrombus. He had transient LOC after MRI and was orthostatic, felt to be vagally-mediated. Also noted to have breast mass and new axillary lymphadenopathy, suspect breast cancer. Patient was diuresed and started on GDMT.  He was discharged with LifeVest with oncology follow up. ? ?Readmitted 1 day after discharge with stroke-like symptoms of new left facial numbness, left facial drop and vision changes. INR 1.5 on admission, patient compliant with Lovenox bridging since discharge. CT head negative, MRI showed patchy acute and subacute infarcts. Neurology consulted and recommended DOAC given stroke in spite of being on warfarin/Lovenox. Deficits resolved and he was discharged home, weight 254 lbs. ? ?Follow up with Dr. Lindi Adie 4/23, he is on cycle 2 of Taxotere and cyotoxan for breast CA. ? ?Today he returns for HF follow up with his mother. Overall feeling fine, fatigued after his  chemo treatments. He does not have dyspnea with activity and tries to walk for exercise. Denies palpitations, abnormal bleeding, CP, dizziness, edema, or PND/Orthopnea. Appetite ok. No fever or chills. Weight at home 246-250 pounds. Taking all medications. No longer wearing LifeVest due to irritation and rash over breast cancer site. 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 ? ?ECG (personally reviewed): none ordered today. ? ?Labs (2/23): K 4.9, creatinine 0.96, hgb 16.2 ?Labs (4/23): K 4.0, creatinine 0.80, hgb 13.5 ? ?Cardiac Studies: ?- Echo (2/23): dilated LV with EF 20-25%. ? ?- R/LHC (2/23): howing elevated filling pressures but relatively preserved cardiac index of 2.3.  There was no significant coronary disease.  ? ?- cMRI (2/23): LV dilation with LVEF 10%, LV thrombus, mildly dilated RV with RVEF 15%.  Delayed enhancement imaging showed inferolateral LGE that could be a OM territory infarct, but corresponding lesion not seen by cath. Most likely acute myocarditis given subepicardial apical inferior LGE also (the RV insertion site LGE can be nonspecific in the setting of volume/pressure overload). Elevated T2 in the apical inferior wall is more suggestive of acute myocarditis. ?  ? ?Past Medical History:  ?Diagnosis Date  ? CHF (congestive heart failure) ([place])   ? ?Current Outpatient Medications  ?Medication Sig Dispense Refill  ? acetaminophen (TYLENOL) 500 MG tablet Take 1,000 mg by mouth every 6 (six) hours as needed for moderate pain.    ? apixaban (ELIQUIS) 5 MG TABS tablet Take 1 tablet by mouth 2  times daily. 180 tablet 1  ? atorvastatin (LIPITOR) 20 MG tablet Take 1 tablet (20 mg total) by mouth daily. [place]  tablet 3  ? dapagliflozin propanediol (FARXIGA) 10 MG TABS tablet Take 1 tablet (10 mg total) by mouth daily. 30 tablet 2  ? dexamethasone (DECADRON) 4 MG tablet Take 1 tablet (4 mg total) by mouth daily. Take 1 tablet day before chemo  and 1 tablet day after chemo with food 12 tablet 0  ? digoxin (LANOXIN) 0.125 MG tablet Take 1 tablet (0.125 mg total) by mouth daily. 30 tablet 2  ? eplerenone (INSPRA) 25 MG tablet Take 2 tablets (50 mg total) by mouth daily. 60 tablet 2  ? ivabradine (CORLANOR) 5 MG TABS tablet Take 1 tablet (5 mg total) by mouth 2 (two) times daily with a meal. 60 tablet 2  ? lidocaine-prilocaine (EMLA) cream Apply to affected area once daily as directed 30 g 2  ? LORazepam (ATIVAN) 0.5 MG tablet Take 1 tablet (0.5 mg total) by mouth every 6 (six) hours as needed (Nausea or vomiting). 30 tablet 0  ? magic mouthwash w/lidocaine SOLN Take 5 mLs by mouth 3 (three) times daily as needed for mouth pain. 120 mL 0  ? metoprolol succinate (TOPROL-XL) 25 MG 24 hr tablet Take 1 tablet (25 mg total) by mouth daily. 30 tablet 2  ? ondansetron (ZOFRAN) 8 MG tablet Take 1 tablet (8 mg total) by mouth 2 (two) times daily as needed for refractory nausea / vomiting. Start on day 3 after chemo. 30 tablet 0  ? prochlorperazine (COMPAZINE) 10 MG tablet Take 1 tablet (10 mg total) by mouth every 6 (six) hours as needed (Nausea or vomiting). 30 tablet 0  ? ?No current facility-administered medications for this encounter.  ? ?Allergies  ?Allergen Reactions  ? Bactrim [Sulfamethoxazole-Trimethoprim] Other (See Comments)  ?  Blood Capillaries Rupture   ? ?Social History  ? ?Socioeconomic History  ? Marital status: Married  ?  Spouse name: Not on file  ? Number of children: 2  ? Years of education: Not on file  ? Highest education level: Bachelor's degree (e.g., BA, AB, BS)  ?Occupational History  ? Occupation: crash Insurance underwriter  ?  Employer: [employer]  ?Tobacco Use  ? Smoking status: Former  ?  Packs/day: 0.25  ?  Years: 25.00  ?  Pack years: 6.25  ?  Types: Cigarettes  ?  Quit date: 2022  ?  Years since quitting: 1.2  ? Smokeless tobacco: Current  ? Tobacco comments:  ?  Smoked 2PPD x 10 years. Last 13 years 3-4 cigarettes/day.   ?Vaping Use  ? Vaping  Use: Former  ? Substances: Nicotine  ?Substance and Sexual Activity  ? Alcohol use: Yes  ?  Alcohol/week: 21.0 standard drinks  ?  Types: 21 Cans of beer per week  ?  Comment: 2 tall boys per day, everyday x 20years  ? Drug use: Not Currently  ?  Frequency: 1.0 times per week  ?  Types: Marijuana  ?  Comment: 3x per month  ? Sexual activity: Not Currently  ?Other Topics Concern  ? Not on file  ?Social History Narrative  ? Right handed   ? ?Social Determinants of Health  ? ?Financial Resource Strain: Low Risk   ? Difficulty of Paying Living Expenses: Not very hard  ?Food Insecurity: No Food Insecurity  ? Worried About Charity fundraiser in the Last Year: Never true  ? Ran Out of Food in the Last Year: Never true  ?Transportation Needs: No Transportation Needs  ? Lack of Transportation (Medical): No  ? Lack of  Transportation (Non-Medical): No  ?Physical Activity: Not on file  ?Stress: Not on file  ?Social Connections: Not on file  ?Intimate Partner Violence: Not on file  ? ?Family History  ?Problem Relation Age of Onset  ? Healthy Mother   ? ?BP 132/80   Pulse 75   Wt 118 kg (260 lb 3.2 oz)   SpO2 98%   BMI 32.52 kg/m?  ? ?Wt Readings from Last 3 Encounters:  ?10/25/21 118 kg (260 lb 3.2 oz)  ?10/23/21 116.8 kg (257 lb 9.6 oz)  ?10/10/21 113.9 kg (251 lb)  ? ?PHYSICAL EXAM: ?General:  NAD. No resp difficulty ?HEENT: Normal ?Neck: Supple. No JVD. Carotids 2+ bilat; no bruits. No lymphadenopathy or thryomegaly appreciated. ?Cor: PMI nondisplaced. Regular rate & rhythm. No rubs, gallops or murmurs. ?Lungs: Clear, port a cath R chest looks ok. ?Abdomen: Soft, nontender, nondistended. No hepatosplenomegaly. No bruits or masses. Good bowel sounds. ?Extremities: No cyanosis, clubbing, rash, edema ?Neuro: Alert & oriented x 3, cranial nerves grossly intact. Moves all 4 extremities w/o difficulty. Affect pleasant. ? ?ASSESSMENT & PLAN: ?1. Chronic Systolic CHF: Patient presents with CHF after a viral-type illness. HIV,  TSH, COVID-19 test, and UDS were all negative. I do not think he drinks enough ETOH to cause CMP. Patient has no family history of cardiomyopathy.  LHC/RHC showed no significant CAD, elevated filling pressures

## 2021-10-25 ENCOUNTER — Ambulatory Visit (HOSPITAL_COMMUNITY)
Admission: RE | Admit: 2021-10-25 | Discharge: 2021-10-25 | Disposition: A | Discharge status: Home / Self Care | Payer: BC Managed Care – PPO | Source: Ambulatory Visit | Attending: Family Medicine | Admitting: Family Medicine

## 2021-10-25 ENCOUNTER — Other Ambulatory Visit (HOSPITAL_COMMUNITY): Payer: Self-pay

## 2021-10-25 ENCOUNTER — Inpatient Hospital Stay: Payer: BC Managed Care – PPO

## 2021-10-25 ENCOUNTER — Other Ambulatory Visit: Payer: Self-pay

## 2021-10-25 ENCOUNTER — Encounter (HOSPITAL_COMMUNITY): Payer: Self-pay

## 2021-10-25 VITALS — BP 132/80 | HR 75 | Wt 260.2 lb

## 2021-10-25 DIAGNOSIS — R55 Syncope and collapse: Secondary | ICD-10-CM

## 2021-10-25 DIAGNOSIS — Z170 Estrogen receptor positive status [ER+]: Secondary | ICD-10-CM

## 2021-10-25 DIAGNOSIS — Z7901 Long term (current) use of anticoagulants: Secondary | ICD-10-CM | POA: Insufficient documentation

## 2021-10-25 DIAGNOSIS — Z5111 Encounter for antineoplastic chemotherapy: Secondary | ICD-10-CM | POA: Diagnosis not present

## 2021-10-25 DIAGNOSIS — Z8673 Personal history of transient ischemic attack (TIA), and cerebral infarction without residual deficits: Secondary | ICD-10-CM | POA: Insufficient documentation

## 2021-10-25 DIAGNOSIS — I513 Intracardiac thrombosis, not elsewhere classified: Secondary | ICD-10-CM

## 2021-10-25 DIAGNOSIS — R29810 Facial weakness: Secondary | ICD-10-CM | POA: Insufficient documentation

## 2021-10-25 DIAGNOSIS — C50422 Malignant neoplasm of upper-outer quadrant of left male breast: Secondary | ICD-10-CM | POA: Diagnosis not present

## 2021-10-25 DIAGNOSIS — C773 Secondary and unspecified malignant neoplasm of axilla and upper limb lymph nodes: Secondary | ICD-10-CM | POA: Diagnosis not present

## 2021-10-25 DIAGNOSIS — Z5189 Encounter for other specified aftercare: Secondary | ICD-10-CM | POA: Diagnosis not present

## 2021-10-25 DIAGNOSIS — I5022 Chronic systolic (congestive) heart failure: Secondary | ICD-10-CM | POA: Diagnosis not present

## 2021-10-25 DIAGNOSIS — R590 Localized enlarged lymph nodes: Secondary | ICD-10-CM | POA: Diagnosis not present

## 2021-10-25 DIAGNOSIS — Z7984 Long term (current) use of oral hypoglycemic drugs: Secondary | ICD-10-CM | POA: Insufficient documentation

## 2021-10-25 DIAGNOSIS — C50922 Malignant neoplasm of unspecified site of left male breast: Secondary | ICD-10-CM | POA: Diagnosis not present

## 2021-10-25 DIAGNOSIS — R233 Spontaneous ecchymoses: Secondary | ICD-10-CM | POA: Diagnosis not present

## 2021-10-25 DIAGNOSIS — R21 Rash and other nonspecific skin eruption: Secondary | ICD-10-CM | POA: Insufficient documentation

## 2021-10-25 DIAGNOSIS — Z79899 Other long term (current) drug therapy: Secondary | ICD-10-CM | POA: Insufficient documentation

## 2021-10-25 DIAGNOSIS — Z7952 Long term (current) use of systemic steroids: Secondary | ICD-10-CM | POA: Diagnosis not present

## 2021-10-25 MED ORDER — PEGFILGRASTIM-CBQV 6 MG/0.6ML ~~LOC~~ SOSY
6.0000 mg | PREFILLED_SYRINGE | Freq: Once | SUBCUTANEOUS | Status: AC
  Administered 2021-10-25: 6 mg via SUBCUTANEOUS
  Filled 2021-10-25: qty 0.6

## 2021-10-25 MED ORDER — ENTRESTO 97-103 MG PO TABS
1.0000 | ORAL_TABLET | Freq: Two times a day (BID) | ORAL | 6 refills | Status: DC
Start: 2021-10-25 — End: 2021-10-31
  Filled 2021-10-25: qty 60, 30d supply, fill #0

## 2021-10-25 NOTE — Patient Instructions (Signed)
Thank you for coming in today ? ?PLEASE have BMET and Dig level drawn at your next oncology appointment ? ?INCREASE Entresto to 97/103 mg 1 tablet twice daily  ? ?Your physician recommends that you schedule a follow-up appointment in:  ?Please keep follow up with Dr. Aundra Dubin ? ?At the [facility], you and your health needs are our priority. As part of our continuing mission to provide you with exceptional heart care, we have created designated Provider Care Teams. These Care Teams include your primary Cardiologist (physician) and Advanced Practice Providers (APPs- Physician Assistants and Nurse Practitioners) who all work together to provide you with the care you need, when you need it.  ? ?You may see any of the following providers on your designated Care Team at your next follow up: ?Dr Glori Bickers ?Dr Loralie Champagne ?Darrick Grinder, NP ?Lyda Jester, PA ?Jessica Milford,NP ?Marlyce Huge, PA ?Audry Riles, PharmD ? ? ?Please be sure to bring in all your medications bottles to every appointment.  ? ?If you have any questions or concerns before your next appointment please send [place] a message through [place] or call our office at [phone].   ? ?TO LEAVE A MESSAGE FOR THE NURSE SELECT OPTION 2, PLEASE LEAVE A MESSAGE INCLUDING: ?YOUR NAME ?DATE OF BIRTH ?CALL BACK NUMBER ?REASON FOR CALL**this is important as we prioritize the call backs ? ?YOU WILL RECEIVE A CALL BACK THE SAME DAY AS LONG AS YOU CALL BEFORE 4:00 PM ? ?

## 2021-10-25 NOTE — Addendum Note (Signed)
Encounter addended by: Rafael Bihari, FNP on: 10/25/2021 10:19 AM ? Actions taken: Clinical Note Signed

## 2021-10-29 DIAGNOSIS — I214 Non-ST elevation (NSTEMI) myocardial infarction: Secondary | ICD-10-CM | POA: Diagnosis not present

## 2021-10-29 DIAGNOSIS — I420 Dilated cardiomyopathy: Secondary | ICD-10-CM | POA: Diagnosis not present

## 2021-10-30 NOTE — Progress Notes (Signed)
? ?Patient Care Team: ?Pcp, No as PCP - General ?Mauro Kaufmann, RN as Oncology Nurse Navigator ?Rockwell Germany, RN as Oncology Nurse Navigator ?Elease Hashimoto (Neurology) ? ?DIAGNOSIS:  ?Encounter Diagnosis  ?Name Primary?  ? 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 ? 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 Palpable left breast mass x8 months. CT 08/27/2021 in ED showed 3.8 ?centimeter left breast mass with left axillary lymph nodes.Mamm and [place]: 4 cm mass with 5 axillary lymph nodes: Biopsy: Grade 2 IDC, lymph node: Positive, ER 100%, PR 50%, Ki-67 25%, HER2 negative ratio 1.25, copy #3.45 ?  ?09/25/2021 Cancer Staging  ? Staging form: Breast, AJCC 8th Edition ?- Clinical: Stage IIA (cT2, cN1, cM0, G2, ER+, PR+, HER2-) - Signed by Nicholas Lose, MD on 09/25/2021 ?Histologic grading system: 3 grade system ? ?  ?10/03/2021 -  Chemotherapy  ? Patient is on Treatment Plan : BREAST TC q21d  ? ?  ?  ? ? ?CHIEF COMPLIANT: Cycle 3 Taxotere and cyotoxan ?  ? ?INTERVAL HISTORY: Michael Stuart is a  41 y.o. male is here because of recent diagnosis of left breast invasive ductal carcinoma. He presents to the clinic today for Cycle 3 Taxotere and cytoxan. He presents to the clinic today for a follow-up. ? ? ?ALLERGIES:  is allergic to bactrim [sulfamethoxazole-trimethoprim]. ? ?MEDICATIONS:  ?Current Outpatient Medications  ?Medication Sig Dispense Refill  ? acetaminophen (TYLENOL) 500 MG tablet Take 1,000 mg by mouth every 6 (six) hours as needed for moderate pain.    ? apixaban (ELIQUIS) 5 MG TABS tablet Take 1 tablet by mouth 2  times daily. 180 tablet 1  ? atorvastatin (LIPITOR) 20 MG tablet Take 1 tablet (20 mg total) by mouth daily. 30 tablet 3  ? dapagliflozin propanediol (FARXIGA) 10 MG TABS tablet Take 1  tablet (10 mg total) by mouth daily. 30 tablet 2  ? dexamethasone (DECADRON) 4 MG tablet Take 1 tablet (4 mg total) by mouth daily. Take 1 tablet day before chemo and 1 tablet day after chemo with food 12 tablet 0  ? digoxin (LANOXIN) 0.125 MG tablet Take 1 tablet (0.125 mg total) by mouth daily. 30 tablet 2  ? eplerenone (INSPRA) 25 MG tablet Take 2 tablets (50 mg total) by mouth daily. 60 tablet 2  ? ivabradine (CORLANOR) 5 MG TABS tablet Take 1 tablet (5 mg total) by mouth 2 (two) times daily with a meal. 180 tablet 3  ? lidocaine-prilocaine (EMLA) cream Apply to affected area once daily as directed 30 g 2  ? LORazepam (ATIVAN) 0.5 MG tablet Take 1 tablet (0.5 mg total) by mouth every 6 (six) hours as needed (Nausea or vomiting). 30 tablet 0  ? magic mouthwash w/lidocaine SOLN Take 5 mLs by mouth 3 (three) times daily as needed for mouth pain. 120 mL 0  ? metoprolol succinate (TOPROL-XL) 25 MG 24 hr tablet Take 1 tablet (25 mg total) by mouth daily. 30 tablet 2  ? ondansetron (ZOFRAN) 8 MG tablet Take 1 tablet (8 mg total) by mouth 2 (two) times daily as needed for refractory nausea / vomiting. Start on day 3 after chemo. 30 tablet 0  ? prochlorperazine (COMPAZINE) 10 MG tablet Take 1 tablet (10 mg total) by mouth every 6 (six) hours as needed (Nausea  or vomiting). 30 tablet 0  ? sacubitril-valsartan (ENTRESTO) 49-51 MG Take 1 tablet by mouth 2 (two) times daily. 60 tablet   ? ?No current facility-administered medications for this visit.  ? ? ?PHYSICAL EXAMINATION: ?ECOG PERFORMANCE STATUS: 1 - Symptomatic but completely ambulatory ? ?There were no vitals filed for this visit. ?There were no vitals filed for this visit. ? ?  ? ?LABORATORY DATA:  ?I have reviewed the data as listed ? ?  Latest Ref Rng & Units 10/23/2021  ? 10:33 AM 10/10/2021  ? 11:43 AM 10/03/2021  ? 11:07 AM  ?CMP  ?Glucose 70 - 99 mg/dL 100   105   99    ?BUN 6 - 20 mg/dL _0 ?Creatinine 0.61 - 1.24 mg/dL 0.80   0.72   0.74    ?Sodium  135 - 145 mmol/L 137   136   139    ?Potassium 3.5 - 5.1 mmol/L 4.0   4.3   4.3    ?Chloride 98 - 111 mmol/L 104   102   107    ?CO2 22 - 32 mmol/L _1 ?Calcium 8.9 - 10.3 mg/dL 9.4   9.4   9.7    ?Total Protein 6.5 - 8.1 g/dL 7.3   7.4   7.5    ?Total Bilirubin 0.3 - 1.2 mg/dL 0.3   0.4   0.4    ?Alkaline Phos 38 - 126 U/L 56   72   64    ?AST 15 - 41 U/L _2 ?ALT 0 - 44 U/L _3 ? ? ?Lab Results  ?Component Value Date  ? WBC 12.4 (H) 10/23/2021  ? HGB 13.5 10/23/2021  ? HCT 39.6 10/23/2021  ? MCV 88.8 10/23/2021  ? PLT 376 10/23/2021  ? NEUTROABS 7.4 10/23/2021  ? ? 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 CT 08/27/2021 in ED showed 3.8 cm left breast mass with left axillary lymph nodes.Mamm and [place]: 4 cm mass with 5 axillary lymph nodes: Biopsy: Grade 2 IDC, lymph node: Positive, ER 100%, PR 50%, Ki-67 25%, HER2 negative ratio 1.25, copy #3.45 ?(Recent history of coronary artery disease EF 22% and stroke) ?  ?Treatment plan: ?1. Neoadjuvant chemotherapy with Taxotere and Cytoxan every 3 weeks x6 cycles ?2. Followed by breast conserving surgery with targeted axillary dissection vs ALND ?3. Followed by adjuvant radiation therapy ?4.  Followed by adjuvant antiestrogen therapy with CDK 4 and 6 inhibitor ?CT CAP and bone scan: No distant metastatic disease ?------------------------------------------------------------------------------------------------------------------------------------------ ?Current treatment: Cycle 3 Taxotere Cytoxan (planned 6 cycles) ?Chemo toxicities: ?1.  Diffuse body pain due to Neulasta: Resolved ?2. mouth sores: No further mouth sores ?3. Hair loss as expected ?  ?His hemoglobin has been slowly coming down.  We will watch it closely. ?Did not have any nausea vomiting or diarrhea or constipation. ?  ?  ?Return to clinic in 3  weeks for cycle 4 ?  ? ?No orders of the defined types were placed in this encounter. ? ?The patient has a good understanding of the overall plan. he agrees with it. he will call with any problems that may develop before the next visit here. ?Total time spent: 30 mins including face  to face time and time spent for planning, charting and co-ordination of care ? ? Michael Ohara, MD ?11/13/21 ? ? ? I Michael Stuart am scribing for Dr. Lindi Adie ? ?I have reviewed the above documentation for accuracy and completeness, and I agree with the above. ?  ?

## 2021-10-31 ENCOUNTER — Other Ambulatory Visit (HOSPITAL_COMMUNITY): Payer: Self-pay | Admitting: Cardiology

## 2021-10-31 ENCOUNTER — Other Ambulatory Visit (HOSPITAL_COMMUNITY): Payer: Self-pay

## 2021-11-01 ENCOUNTER — Other Ambulatory Visit (HOSPITAL_COMMUNITY): Payer: Self-pay

## 2021-11-02 ENCOUNTER — Other Ambulatory Visit (HOSPITAL_COMMUNITY): Payer: Self-pay | Admitting: *Deleted

## 2021-11-02 ENCOUNTER — Other Ambulatory Visit (HOSPITAL_COMMUNITY): Payer: Self-pay

## 2021-11-02 ENCOUNTER — Telehealth (HOSPITAL_COMMUNITY): Payer: Self-pay | Admitting: *Deleted

## 2021-11-02 MED ORDER — IVABRADINE HCL 5 MG PO TABS: 5.0000 mg | ORAL_TABLET | Freq: Two times a day (BID) | ORAL | 3 refills | Status: DC

## 2021-11-02 MED ORDER — ENTRESTO 49-51 MG PO TABS: 1.0000 | ORAL_TABLET | Freq: Two times a day (BID) | ORAL | Status: DC

## 2021-11-02 NOTE — Telephone Encounter (Signed)
Pt called stating he felt dizzy and light headed when he increase entresto to 97/'103mg'$ . Pts bo was 97/65. Pt decreased it and felt a lot better. Per Dr.Mclean take entresto 49/'51mg'$  bid. Pt aware.  ?

## 2021-11-10 ENCOUNTER — Telehealth (HOSPITAL_COMMUNITY): Payer: Self-pay | Admitting: Cardiology

## 2021-11-10 NOTE — Telephone Encounter (Signed)
Discontinue order called into [facility] ?[phone] ?Per Allena Katz, NP ?Patient can return equipment (see OV 10/25/21) ?

## 2021-11-13 ENCOUNTER — Inpatient Hospital Stay: Payer: BC Managed Care – PPO | Attending: Hematology and Oncology

## 2021-11-13 ENCOUNTER — Inpatient Hospital Stay: Payer: BC Managed Care – PPO | Admitting: Hematology and Oncology

## 2021-11-13 ENCOUNTER — Encounter: Payer: Self-pay | Admitting: *Deleted

## 2021-11-13 ENCOUNTER — Other Ambulatory Visit: Payer: Self-pay

## 2021-11-13 ENCOUNTER — Inpatient Hospital Stay: Payer: BC Managed Care – PPO

## 2021-11-13 VITALS — HR 89

## 2021-11-13 DIAGNOSIS — Z5111 Encounter for antineoplastic chemotherapy: Secondary | ICD-10-CM | POA: Insufficient documentation

## 2021-11-13 DIAGNOSIS — C50412 Malignant neoplasm of upper-outer quadrant of left female breast: Secondary | ICD-10-CM

## 2021-11-13 DIAGNOSIS — C50422 Malignant neoplasm of upper-outer quadrant of left male breast: Secondary | ICD-10-CM

## 2021-11-13 DIAGNOSIS — Z79899 Other long term (current) drug therapy: Secondary | ICD-10-CM | POA: Diagnosis not present

## 2021-11-13 DIAGNOSIS — Z5189 Encounter for other specified aftercare: Secondary | ICD-10-CM | POA: Diagnosis not present

## 2021-11-13 DIAGNOSIS — Z170 Estrogen receptor positive status [ER+]: Secondary | ICD-10-CM | POA: Insufficient documentation

## 2021-11-13 DIAGNOSIS — C773 Secondary and unspecified malignant neoplasm of axilla and upper limb lymph nodes: Secondary | ICD-10-CM | POA: Insufficient documentation

## 2021-11-13 DIAGNOSIS — I5022 Chronic systolic (congestive) heart failure: Secondary | ICD-10-CM

## 2021-11-13 DIAGNOSIS — Z95828 Presence of other vascular implants and grafts: Secondary | ICD-10-CM

## 2021-11-13 LAB — CMP (CANCER CENTER ONLY)
ALT: 18 U/L (ref 0–44)
AST: 16 U/L (ref 15–41)
Albumin: 4.2 g/dL (ref 3.5–5.0)
Alkaline Phosphatase: 64 U/L (ref 38–126)
Anion gap: 6 (ref 5–15)
BUN: 13 mg/dL (ref 6–20)
CO2: 24 mmol/L (ref 22–32)
Calcium: 9.2 mg/dL (ref 8.9–10.3)
Chloride: 107 mmol/L (ref 98–111)
Creatinine: 0.77 mg/dL (ref 0.61–1.24)
GFR, Estimated: >60 mL/min (ref >60)
Glucose, Bld: 140 mg/dL — ABNORMAL HIGH (ref 70–99)
Potassium: 4.4 mmol/L (ref 3.5–5.1)
Sodium: 137 mmol/L (ref 135–145)
Total Bilirubin: 0.5 mg/dL (ref 0.3–1.2)
Total Protein: 7.1 g/dL (ref 6.5–8.1)

## 2021-11-13 LAB — CBC WITH DIFFERENTIAL (CANCER CENTER ONLY)
Abs Immature Granulocytes: 0.04 10*3/uL (ref 0.00–0.07)
Basophils Absolute: 0.1 10*3/uL (ref 0.0–0.1)
Basophils Relative: 1 %
Eosinophils Absolute: 0 10*3/uL (ref 0.0–0.5)
Eosinophils Relative: 0 %
HCT: 36.9 % — ABNORMAL LOW (ref 39.0–52.0)
Hemoglobin: 12.7 g/dL — ABNORMAL LOW (ref 13.0–17.0)
Immature Granulocytes: 1 %
Lymphocytes Relative: 16 %
Lymphs Abs: 1.4 10*3/uL (ref 0.7–4.0)
MCH: 31.3 pg (ref 26.0–34.0)
MCHC: 34.4 g/dL (ref 30.0–36.0)
MCV: 90.9 fL (ref 80.0–100.0)
Monocytes Absolute: 0.5 10*3/uL (ref 0.1–1.0)
Monocytes Relative: 6 %
Neutro Abs: 6.7 10*3/uL (ref 1.7–7.7)
Neutrophils Relative %: 76 %
Platelet Count: 302 10*3/uL (ref 150–400)
RBC: 4.06 MIL/uL — ABNORMAL LOW (ref 4.22–5.81)
RDW: 17.8 % — ABNORMAL HIGH (ref 11.5–15.5)
WBC Count: 8.6 10*3/uL (ref 4.0–10.5)
nRBC: 0 % (ref 0.0–0.2)

## 2021-11-13 LAB — DIGOXIN LEVEL: Digoxin Level: 0.4 ng/mL — ABNORMAL LOW (ref 0.8–2.0)

## 2021-11-13 MED ORDER — SODIUM CHLORIDE 0.9 % IV SOLN
600.0000 mg/m2 | Freq: Once | INTRAVENOUS | Status: AC
  Administered 2021-11-13: 1480 mg via INTRAVENOUS
  Filled 2021-11-13: qty 74

## 2021-11-13 MED ORDER — HEPARIN SOD (PORK) LOCK FLUSH 100 UNIT/ML IV SOLN
500.0000 [IU] | Freq: Once | INTRAVENOUS | Status: AC | PRN
  Administered 2021-11-13: 500 [IU]

## 2021-11-13 MED ORDER — SODIUM CHLORIDE 0.9 % IV SOLN
150.0000 mg | Freq: Once | INTRAVENOUS | Status: AC
  Administered 2021-11-13: 150 mg via INTRAVENOUS
  Filled 2021-11-13: qty 150

## 2021-11-13 MED ORDER — SODIUM CHLORIDE 0.9% FLUSH
10.0000 mL | INTRAVENOUS | Status: DC | PRN
  Administered 2021-11-13: 10 mL

## 2021-11-13 MED ORDER — SODIUM CHLORIDE 0.9 % IV SOLN
75.0000 mg/m2 | Freq: Once | INTRAVENOUS | Status: AC
  Administered 2021-11-13: 190 mg via INTRAVENOUS
  Filled 2021-11-13: qty 19

## 2021-11-13 MED ORDER — SODIUM CHLORIDE 0.9 % IV SOLN: Freq: Once | INTRAVENOUS | Status: AC

## 2021-11-13 MED ORDER — PALONOSETRON HCL INJECTION 0.25 MG/5ML
0.2500 mg | Freq: Once | INTRAVENOUS | Status: AC
  Administered 2021-11-13: 0.25 mg via INTRAVENOUS
  Filled 2021-11-13: qty 5

## 2021-11-13 MED ORDER — SODIUM CHLORIDE 0.9% FLUSH
10.0000 mL | Freq: Once | INTRAVENOUS | Status: AC
  Administered 2021-11-13: 10 mL

## 2021-11-13 MED ORDER — SODIUM CHLORIDE 0.9 % IV SOLN
10.0000 mg | Freq: Once | INTRAVENOUS | Status: AC
  Administered 2021-11-13: 10 mg via INTRAVENOUS
  Filled 2021-11-13: qty 10

## 2021-11-13 NOTE — Assessment & Plan Note (Addendum)
09/15/2021:Palpable left breast mass x8 months. CT 08/27/2021 in ED showed 3.8?cm?left breast mass with left axillary lymph nodes.Mamm and [place]: 4 cm mass with 5 axillary lymph nodes: Biopsy: Grade 2 IDC, lymph node: Positive, ER 100%, PR 50%, Ki-67 25%, HER2 negative ratio 1.25, copy #3.45 ?(Recent history of coronary artery disease EF 22% and stroke) ?? ?Treatment plan: ?1. Neoadjuvant chemotherapy with?Taxotere and Cytoxan every 3 weeks x6 cycles ?2. Followed by breast conserving surgery with targeted axillary dissection?vs ALND ?3. Followed by adjuvant radiation therapy ?4.??Followed by adjuvant antiestrogen therapy with CDK 4 and 6 inhibitor ?CT CAP and bone scan: No distant metastatic disease ?------------------------------------------------------------------------------------------------------------------------------------------ ?Current treatment: Cycle 3?Taxotere Cytoxan (planned 6 cycles) ?Chemo toxicities: ?1.??Diffuse body pain due to Neulasta: With the next treatment I recommended that he take Motrin ?2.?mouth sores: No further mouth sores ?3.??Sore throat: Magic mouthwash resolved his sore throat ?4.  Hair loss as expected ?? ?Did not have any nausea vomiting or diarrhea or constipation. ?  ?? ?Return to clinic in 3 weeks for cycle 4 ?? ?

## 2021-11-13 NOTE — Patient Instructions (Signed)
[facility]ICAL [facility]COLOG[facility]  Discharge Instructions: Thank you for choosing [facility] to provide your oncology and hematology care.   If you have a lab appointment with the Cancer Center, please go directly to the Cancer Center and check in at the registration area.   Wear comfortable clothing and clothing appropriate for easy access to any Portacath or PICC line.   We strive to give you quality time with your provider. You may need to reschedule your appointment if you arrive late (15 or more minutes).  Arriving late affects you and other patients whose appointments are after yours.  Also, if you miss three or more appointments without notifying the office, you may be dismissed from the clinic at the provider's discretion.      For prescription refill requests, have your pharmacy contact our office and allow 72 hours for refills to be completed.    Today you received the following chemotherapy and/or immunotherapy agents: Docetaxel, Cytoxan.       To help prevent nausea and vomiting after your treatment, we encourage you to take your nausea medication as directed.  BELOW ARE SYMPTOMS THAT SHOULD BE REPORTED IMMEDIATELY: *FEVER GREATER THAN 100.4 F (38 C) OR HIGHER *CHILLS OR SWEATING *NAUSEA AND VOMITING THAT IS NOT CONTROLLED WITH YOUR NAUSEA MEDICATION *UNUSUAL SHORTNESS OF BREATH *UNUSUAL BRUISING OR BLEEDING *URINARY PROBLEMS (pain or burning when urinating, or frequent urination) *BOWEL PROBLEMS (unusual diarrhea, constipation, pain near the anus) TENDERNESS IN MOUTH AND THROAT WITH OR WITHOUT PRESENCE OF ULCERS (sore throat, sores in mouth, or a toothache) UNUSUAL RASH, SWELLING OR PAIN  UNUSUAL VAGINAL DISCHARGE OR ITCHING   Items with * indicate a potential emergency and should be followed up as soon as possible or go to the Emergency Department if any problems should occur.  Please show the CHEMOTHERAPY ALERT CARD or IMMUNOTHERAPY ALERT CARD at  check-in to the Emergency Department and triage nurse.  Should you have questions after your visit or need to cancel or reschedule your appointment, please contact [facility]  Dept: [phone]  and follow the prompts.  Office hours are 8:00 a.m. to 4:30 p.m. Monday - Friday. Please note that voicemails left after 4:00 p.m. may not be returned until the following business day.  We are closed weekends and major holidays. You have access to a nurse at all times for urgent questions. Please call the main number to the clinic Dept: [phone] and follow the prompts.   For any non-urgent questions, you may also contact your provider using MyChart. We now offer e-Visits for anyone 18 and older to request care online for non-urgent symptoms. For details visit [email or web address].   Also download the MyChart app! Go to the app store, search "MyChart", open the app, select [place], and log in with your MyChart username and password.  Due to Covid, a mask is required upon entering the hospital/clinic. If you do not have a mask, one will be given to you upon arrival. For doctor visits, patients may have 1 support person aged 18 or older with them. For treatment visits, patients cannot have anyone with them due to current Covid guidelines and our immunocompromised population.  

## 2021-11-14 ENCOUNTER — Other Ambulatory Visit (HOSPITAL_COMMUNITY): Payer: Self-pay

## 2021-11-14 ENCOUNTER — Other Ambulatory Visit (HOSPITAL_COMMUNITY): Payer: Self-pay | Admitting: *Deleted

## 2021-11-14 MED ORDER — ENTRESTO 49-51 MG PO TABS
1.0000 | ORAL_TABLET | Freq: Two times a day (BID) | ORAL | 3 refills | Status: DC
  Filled 2021-11-14: qty 60, 30d supply, fill #0
  Filled 2021-12-19 – 2022-01-09 (×2): qty 60, 30d supply, fill #1

## 2021-11-15 ENCOUNTER — Telehealth: Payer: Self-pay | Admitting: Hematology and Oncology

## 2021-11-15 ENCOUNTER — Inpatient Hospital Stay: Payer: BC Managed Care – PPO

## 2021-11-15 ENCOUNTER — Other Ambulatory Visit: Payer: Self-pay

## 2021-11-15 VITALS — BP 96/68 | HR 100 | Temp 98.7°F | Resp 20

## 2021-11-15 DIAGNOSIS — Z170 Estrogen receptor positive status [ER+]: Secondary | ICD-10-CM

## 2021-11-15 DIAGNOSIS — C773 Secondary and unspecified malignant neoplasm of axilla and upper limb lymph nodes: Secondary | ICD-10-CM | POA: Diagnosis not present

## 2021-11-15 DIAGNOSIS — C50422 Malignant neoplasm of upper-outer quadrant of left male breast: Secondary | ICD-10-CM | POA: Diagnosis not present

## 2021-11-15 DIAGNOSIS — Z79899 Other long term (current) drug therapy: Secondary | ICD-10-CM | POA: Diagnosis not present

## 2021-11-15 DIAGNOSIS — Z5111 Encounter for antineoplastic chemotherapy: Secondary | ICD-10-CM | POA: Diagnosis not present

## 2021-11-15 DIAGNOSIS — Z5189 Encounter for other specified aftercare: Secondary | ICD-10-CM | POA: Diagnosis not present

## 2021-11-15 MED ORDER — PEGFILGRASTIM-CBQV 6 MG/0.6ML ~~LOC~~ SOSY
6.0000 mg | PREFILLED_SYRINGE | Freq: Once | SUBCUTANEOUS | Status: AC
  Administered 2021-11-15: 6 mg via SUBCUTANEOUS
  Filled 2021-11-15: qty 0.6

## 2021-11-15 NOTE — Telephone Encounter (Signed)
Scheduled appointment per 5/8 los. Unable to leave a message due to the mailbox being full. ?

## 2021-11-21 ENCOUNTER — Ambulatory Visit: Payer: BC Managed Care – PPO

## 2021-11-21 ENCOUNTER — Telehealth: Payer: Self-pay | Admitting: Pharmacist

## 2021-11-21 ENCOUNTER — Other Ambulatory Visit (HOSPITAL_COMMUNITY): Payer: Self-pay

## 2021-11-21 DIAGNOSIS — I5022 Chronic systolic (congestive) heart failure: Secondary | ICD-10-CM

## 2021-11-21 MED ORDER — DAPAGLIFLOZIN PROPANEDIOL 10 MG PO TABS
10.0000 mg | ORAL_TABLET | Freq: Every day | ORAL | 2 refills | Status: DC
  Filled 2021-11-21: qty 83, 83d supply, fill #0
  Filled 2021-12-01: qty 30, 30d supply, fill #0

## 2021-11-21 MED ORDER — DIGOXIN 125 MCG PO TABS
0.1250 mg | ORAL_TABLET | Freq: Every day | ORAL | 2 refills | Status: DC
  Filled 2021-11-21 – 2021-11-23 (×2): qty 30, 30d supply, fill #0
  Filled 2021-12-19: qty 30, 30d supply, fill #1
  Filled 2022-01-09: qty 30, 30d supply, fill #2

## 2021-11-21 MED ORDER — METOPROLOL SUCCINATE ER 25 MG PO TB24
25.0000 mg | ORAL_TABLET | Freq: Every day | ORAL | 2 refills | Status: DC
  Filled 2021-11-21: qty 83, 83d supply, fill #0
  Filled 2021-11-23: qty 30, 30d supply, fill #0

## 2021-11-21 MED ORDER — EPLERENONE 25 MG PO TABS
50.0000 mg | ORAL_TABLET | Freq: Every day | ORAL | 2 refills | Status: DC
  Filled 2021-11-21: qty 166, 83d supply, fill #0
  Filled 2021-11-23: qty 60, 30d supply, fill #0

## 2021-11-21 NOTE — Telephone Encounter (Signed)
Med list reviewed and updated with Michael Stuart and Michael Stuart's mom. Michael Stuart hasnt been on corlanor 5 mg BID over the past month due to insurance and cost reason. HR since has been trending up ~80-90 bpm. Highest reading of 135 over the past month since stopping corlanor. Provided Michael Stuart with cornalor 5 mg samples and provided Michael Stuart with copay card information. Michael Stuart to call his insurance company to verify if corlanor Rx can be failed through retail moving forward. Rx refilled per Michael Stuart's request. ECHO completed today. Michael Stuart has an upcoming follow up with heart failure clinic on 12/12/21 and with Dr. Virgina Jock on 12/14/21 ?

## 2021-11-23 ENCOUNTER — Other Ambulatory Visit (HOSPITAL_COMMUNITY): Payer: Self-pay

## 2021-11-23 ENCOUNTER — Other Ambulatory Visit: Payer: Self-pay | Admitting: Cardiology

## 2021-11-23 MED ORDER — IVABRADINE HCL 5 MG PO TABS
ORAL_TABLET | ORAL | 0 refills | Status: DC
  Filled 2021-11-23: qty 60, 30d supply, fill #0

## 2021-11-24 ENCOUNTER — Other Ambulatory Visit (HOSPITAL_COMMUNITY): Payer: Self-pay

## 2021-11-28 NOTE — Progress Notes (Signed)
Patient Care Team: Pcp, No as PCP - General Mauro Kaufmann, RN as Oncology Nurse Navigator Rockwell Germany, RN as Oncology Nurse Navigator Elease Hashimoto (Neurology)  DIAGNOSIS:  Encounter Diagnosis  Name Primary?   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 CT 08/27/2021 in ED showed 3.8 centimeter left breast mass with left axillary lymph nodes.Mamm and [place]: 4 cm mass with 5 axillary lymph nodes: Biopsy: Grade 2 IDC, lymph node: Positive, ER 100%, PR 50%, Ki-67 25%, HER2 negative ratio 1.25, copy #3.45   09/25/2021 Cancer Staging   Staging form: Breast, AJCC 8th Edition - Clinical: Stage IIA (cT2, cN1, cM0, G2, ER+, PR+, HER2-) - Signed by Nicholas Lose, MD on 09/25/2021 Histologic grading system: 3 grade system    10/03/2021 -  Chemotherapy   Patient is on Treatment Plan : BREAST TC q21d        CHIEF COMPLIANT: Cycle 4 Taxotere and cyotoxan  INTERVAL HISTORY: KREW HORTMAN is a 41 y.o. male is here because of recent diagnosis of left breast invasive ductal carcinoma. He presents to the clinic today for Cycle 3 Taxotere and cytoxan. Denies mouth sores and bone pain. Complains of a sty that formed. Denies numbness and tingling in toes and fingers.   ALLERGIES:  is allergic to bactrim [sulfamethoxazole-trimethoprim].  MEDICATIONS:  Current Outpatient Medications  Medication Sig Dispense Refill   acetaminophen (TYLENOL) 500 MG tablet Take 1,000 mg by mouth every 6 (six) hours as needed for moderate pain.     apixaban (ELIQUIS) 5 MG TABS tablet Take 1 tablet by mouth 2  times daily. 180 tablet 1   atorvastatin (LIPITOR) 20 MG tablet Take 1 tablet (20 mg total) by mouth daily. 30 tablet 3    dapagliflozin propanediol (FARXIGA) 10 MG TABS tablet Take 1 tablet by mouth daily. 90 tablet 2   dexamethasone (DECADRON) 4 MG tablet Take 1 tablet (4 mg total) by mouth daily. Take 1 tablet day before chemo and 1 tablet day after chemo with food 12 tablet 0   digoxin (LANOXIN) 0.125 MG tablet Take 1 tablet by mouth daily. 30 tablet 2   eplerenone (INSPRA) 25 MG tablet Take 2 tablets by mouth daily. 180 tablet 2   ivabradine (CORLANOR) 5 MG TABS tablet Take 1 tablet (5 mg total) by mouth 2 (two) times daily with a meal. 60 tablet 3   lidocaine-prilocaine (EMLA) cream Apply to affected area once daily as directed 30 g 2   LORazepam (ATIVAN) 0.5 MG tablet Take 1 tablet (0.5 mg total) by mouth every 6 (six) hours as needed (Nausea or vomiting). 30 tablet 0   magic mouthwash w/lidocaine SOLN Take 5 mLs by mouth 3 (three) times daily as needed for mouth pain. 120 mL 0   metoprolol succinate (TOPROL-XL) 25 MG 24 hr tablet Take 1 tablet by mouth daily. 90 tablet 2   ondansetron (ZOFRAN) 8 MG tablet Take 1 tablet (8 mg total) by mouth 2 (two) times daily as needed for refractory nausea / vomiting. Start on day 3 after chemo. 30 tablet 0   prochlorperazine (COMPAZINE) 10 MG tablet Take 1 tablet (10 mg total) by mouth every 6 (six) hours as needed (Nausea or vomiting). 30 tablet 0  sacubitril-valsartan (ENTRESTO) 49-51 MG Take 1 tablet by mouth 2 times daily. 60 tablet 3   No current facility-administered medications for this visit.    PHYSICAL EXAMINATION: ECOG PERFORMANCE STATUS: 1 - Symptomatic but completely ambulatory  Vitals:   12/05/21 1014  BP: (!) 160/81  Pulse: (!) 107  Resp: 18  Temp: 97.9 F (36.6 C)  SpO2: 98%   Filed Weights   12/05/21 1014  Weight: 266 lb 14.4 oz (121.1 kg)     LABORATORY DATA:  I have reviewed the data as listed    Latest Ref Rng & Units 12/05/2021    9:59 AM 11/13/2021    9:58 AM 10/23/2021   10:33 AM  CMP  Glucose 70 - 99 mg/dL 148   140   100    BUN 6  - 20 mg/dL 11   13   15     Creatinine 0.61 - 1.24 mg/dL 0.67   0.77   0.80    Sodium 135 - 145 mmol/L 137   137   137    Potassium 3.5 - 5.1 mmol/L 4.2   4.4   4.0    Chloride 98 - 111 mmol/L 106   107   104    CO2 22 - 32 mmol/L 24   24   25     Calcium 8.9 - 10.3 mg/dL 9.3   9.2   9.4    Total Protein 6.5 - 8.1 g/dL 7.0   7.1   7.3    Total Bilirubin 0.3 - 1.2 mg/dL 0.4   0.5   0.3    Alkaline Phos 38 - 126 U/L 55   64   56    AST 15 - 41 U/L 15   16   16     ALT 0 - 44 U/L 16   18   19       Lab Results  Component Value Date   WBC 5.7 12/05/2021   HGB 13.0 12/05/2021   HCT 38.4 (L) 12/05/2021   MCV 94.8 12/05/2021   PLT 384 12/05/2021   NEUTROABS 4.0 12/05/2021    ASSESSMENT & PLAN:  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 CT 08/27/2021 in ED showed 3.8 cm left breast mass with left axillary lymph nodes.Mamm and [place]: 4 cm mass with 5 axillary lymph nodes: Biopsy: Grade 2 IDC, lymph node: Positive, ER 100%, PR 50%, Ki-67 25%, HER2 negative ratio 1.25, copy #3.45 (Recent history of coronary artery disease EF 22% and stroke)   Treatment plan: 1. Neoadjuvant chemotherapy with Taxotere and Cytoxan every 3 weeks x6 cycles 2. Followed by breast conserving surgery with targeted axillary dissection vs ALND 3. Followed by adjuvant radiation therapy 4.  Followed by adjuvant antiestrogen therapy with CDK 4 and 6 inhibitor CT CAP and bone scan: No distant metastatic disease ------------------------------------------------------------------------------------------------------------------------------------------ Current treatment: Cycle 4 Taxotere Cytoxan Chemo toxicities: 1.  Diffuse body pain due to Neulasta:  2. sensitive to the tips of the fingers for couple of days after each chemo 3.  Hair loss as expected 4.  Nail changes   Did not have any nausea vomiting or diarrhea or  constipation.     Return to clinic in 3 weeks for cycle 5 We will request an appointment with the surgeon to discuss mastectomy after finishing 6 cycles of chemo.    No orders of the defined types were placed in this encounter.  The patient  has a good understanding of the overall plan. he agrees with it. he will call with any problems that may develop before the next visit here. Total time spent: 30 mins including face to face time and time spent for planning, charting and co-ordination of care   Harriette Ohara, MD 12/05/21  I Gardiner Coins am scribing for Dr. Lindi Adie  I have reviewed the above documentation for accuracy and completeness, and I agree with the above.

## 2021-12-01 ENCOUNTER — Telehealth (HOSPITAL_COMMUNITY): Payer: Self-pay

## 2021-12-01 ENCOUNTER — Other Ambulatory Visit (HOSPITAL_COMMUNITY): Payer: Self-pay

## 2021-12-01 ENCOUNTER — Telehealth: Payer: Self-pay

## 2021-12-01 MED ORDER — IVABRADINE HCL 5 MG PO TABS: 5.0000 mg | ORAL_TABLET | Freq: Two times a day (BID) | ORAL | 3 refills | Status: DC

## 2021-12-01 MED FILL — Fosaprepitant Dimeglumine For IV Infusion 150 MG (Base Eq): INTRAVENOUS | Qty: 5 | Status: AC

## 2021-12-01 MED FILL — Dexamethasone Sodium Phosphate Inj 100 MG/10ML: INTRAMUSCULAR | Qty: 1 | Status: AC

## 2021-12-01 NOTE — Telephone Encounter (Signed)
I sent it  

## 2021-12-01 NOTE — Telephone Encounter (Signed)
Received a fax requesting medical records from [facility] . Records were successfully faxed to: [phone] ,which was the number provided.. Medical request form will be scanned into patients chart.

## 2021-12-01 NOTE — Telephone Encounter (Signed)
Pt requesting Corlanor refill. It looks like this was prescribed by Nicholas Lose. Okay to refill?

## 2021-12-05 ENCOUNTER — Other Ambulatory Visit: Payer: Self-pay

## 2021-12-05 ENCOUNTER — Inpatient Hospital Stay: Payer: BC Managed Care – PPO

## 2021-12-05 ENCOUNTER — Encounter: Payer: Self-pay | Admitting: *Deleted

## 2021-12-05 ENCOUNTER — Inpatient Hospital Stay: Payer: BC Managed Care – PPO | Admitting: Hematology and Oncology

## 2021-12-05 ENCOUNTER — Telehealth: Payer: Self-pay | Admitting: *Deleted

## 2021-12-05 VITALS — HR 92

## 2021-12-05 DIAGNOSIS — C50412 Malignant neoplasm of upper-outer quadrant of left female breast: Secondary | ICD-10-CM

## 2021-12-05 DIAGNOSIS — Z170 Estrogen receptor positive status [ER+]: Secondary | ICD-10-CM | POA: Diagnosis not present

## 2021-12-05 DIAGNOSIS — Z79899 Other long term (current) drug therapy: Secondary | ICD-10-CM | POA: Diagnosis not present

## 2021-12-05 DIAGNOSIS — C50422 Malignant neoplasm of upper-outer quadrant of left male breast: Secondary | ICD-10-CM | POA: Diagnosis not present

## 2021-12-05 DIAGNOSIS — Z5189 Encounter for other specified aftercare: Secondary | ICD-10-CM | POA: Diagnosis not present

## 2021-12-05 DIAGNOSIS — Z95828 Presence of other vascular implants and grafts: Secondary | ICD-10-CM

## 2021-12-05 DIAGNOSIS — Z5111 Encounter for antineoplastic chemotherapy: Secondary | ICD-10-CM | POA: Diagnosis not present

## 2021-12-05 DIAGNOSIS — C773 Secondary and unspecified malignant neoplasm of axilla and upper limb lymph nodes: Secondary | ICD-10-CM | POA: Diagnosis not present

## 2021-12-05 LAB — CMP (CANCER CENTER ONLY)
ALT: 16 U/L (ref 0–44)
AST: 15 U/L (ref 15–41)
Albumin: 4.3 g/dL (ref 3.5–5.0)
Alkaline Phosphatase: 55 U/L (ref 38–126)
Anion gap: 7 (ref 5–15)
BUN: 11 mg/dL (ref 6–20)
CO2: 24 mmol/L (ref 22–32)
Calcium: 9.3 mg/dL (ref 8.9–10.3)
Chloride: 106 mmol/L (ref 98–111)
Creatinine: 0.67 mg/dL (ref 0.61–1.24)
GFR, Estimated: >60 mL/min (ref >60)
Glucose, Bld: 148 mg/dL — ABNORMAL HIGH (ref 70–99)
Potassium: 4.2 mmol/L (ref 3.5–5.1)
Sodium: 137 mmol/L (ref 135–145)
Total Bilirubin: 0.4 mg/dL (ref 0.3–1.2)
Total Protein: 7 g/dL (ref 6.5–8.1)

## 2021-12-05 LAB — CBC WITH DIFFERENTIAL (CANCER CENTER ONLY)
Abs Immature Granulocytes: 0.02 10*3/uL (ref 0.00–0.07)
Basophils Absolute: 0 10*3/uL (ref 0.0–0.1)
Basophils Relative: 1 %
Eosinophils Absolute: 0 10*3/uL (ref 0.0–0.5)
Eosinophils Relative: 0 %
HCT: 38.4 % — ABNORMAL LOW (ref 39.0–52.0)
Hemoglobin: 13 g/dL (ref 13.0–17.0)
Immature Granulocytes: 0 %
Lymphocytes Relative: 23 %
Lymphs Abs: 1.3 10*3/uL (ref 0.7–4.0)
MCH: 32.1 pg (ref 26.0–34.0)
MCHC: 33.9 g/dL (ref 30.0–36.0)
MCV: 94.8 fL (ref 80.0–100.0)
Monocytes Absolute: 0.4 10*3/uL (ref 0.1–1.0)
Monocytes Relative: 7 %
Neutro Abs: 4 10*3/uL (ref 1.7–7.7)
Neutrophils Relative %: 69 %
Platelet Count: 384 10*3/uL (ref 150–400)
RBC: 4.05 MIL/uL — ABNORMAL LOW (ref 4.22–5.81)
RDW: 19 % — ABNORMAL HIGH (ref 11.5–15.5)
WBC Count: 5.7 10*3/uL (ref 4.0–10.5)
nRBC: 0 % (ref 0.0–0.2)

## 2021-12-05 MED ORDER — SODIUM CHLORIDE 0.9 % IV SOLN: Freq: Once | INTRAVENOUS | Status: AC

## 2021-12-05 MED ORDER — SODIUM CHLORIDE 0.9 % IV SOLN
600.0000 mg/m2 | Freq: Once | INTRAVENOUS | Status: AC
  Administered 2021-12-05: 1480 mg via INTRAVENOUS
  Filled 2021-12-05: qty 74

## 2021-12-05 MED ORDER — SODIUM CHLORIDE 0.9 % IV SOLN
150.0000 mg | Freq: Once | INTRAVENOUS | Status: AC
  Administered 2021-12-05: 150 mg via INTRAVENOUS
  Filled 2021-12-05: qty 150

## 2021-12-05 MED ORDER — SODIUM CHLORIDE 0.9 % IV SOLN
10.0000 mg | Freq: Once | INTRAVENOUS | Status: AC
  Administered 2021-12-05: 10 mg via INTRAVENOUS
  Filled 2021-12-05: qty 10

## 2021-12-05 MED ORDER — SODIUM CHLORIDE 0.9% FLUSH
10.0000 mL | INTRAVENOUS | Status: DC | PRN
  Administered 2021-12-05: 10 mL

## 2021-12-05 MED ORDER — SODIUM CHLORIDE 0.9 % IV SOLN
75.0000 mg/m2 | Freq: Once | INTRAVENOUS | Status: AC
  Administered 2021-12-05: 190 mg via INTRAVENOUS
  Filled 2021-12-05: qty 19

## 2021-12-05 MED ORDER — HEPARIN SOD (PORK) LOCK FLUSH 100 UNIT/ML IV SOLN
500.0000 [IU] | Freq: Once | INTRAVENOUS | Status: AC | PRN
  Administered 2021-12-05: 500 [IU]

## 2021-12-05 MED ORDER — PALONOSETRON HCL INJECTION 0.25 MG/5ML
0.2500 mg | Freq: Once | INTRAVENOUS | Status: AC
  Administered 2021-12-05: 0.25 mg via INTRAVENOUS
  Filled 2021-12-05: qty 5

## 2021-12-05 MED ORDER — SODIUM CHLORIDE 0.9% FLUSH
10.0000 mL | Freq: Once | INTRAVENOUS | Status: AC
  Administered 2021-12-05: 10 mL

## 2021-12-05 NOTE — Patient Instructions (Signed)
[facility]  Discharge Instructions: Thank you for choosing [facility] to provide your oncology and hematology care.   If you have a lab appointment with the Cancer Center, please go directly to the Cancer Center and check in at the registration area.   Wear comfortable clothing and clothing appropriate for easy access to any Portacath or PICC line.   We strive to give you quality time with your provider. You may need to reschedule your appointment if you arrive late (15 or more minutes).  Arriving late affects you and other patients whose appointments are after yours.  Also, if you miss three or more appointments without notifying the office, you may be dismissed from the clinic at the provider's discretion.      For prescription refill requests, have your pharmacy contact our office and allow 72 hours for refills to be completed.    Today you received the following chemotherapy and/or immunotherapy agents: Docetaxel, Cytoxan.       To help prevent nausea and vomiting after your treatment, we encourage you to take your nausea medication as directed.  BELOW ARE SYMPTOMS THAT SHOULD BE REPORTED IMMEDIATELY: *FEVER GREATER THAN 100.4 F (38 C) OR HIGHER *CHILLS OR SWEATING *NAUSEA AND VOMITING THAT IS NOT CONTROLLED WITH YOUR NAUSEA MEDICATION *UNUSUAL SHORTNESS OF BREATH *UNUSUAL BRUISING OR BLEEDING *URINARY PROBLEMS (pain or burning when urinating, or frequent urination) *BOWEL PROBLEMS (unusual diarrhea, constipation, pain near the anus) TENDERNESS IN MOUTH AND THROAT WITH OR WITHOUT PRESENCE OF ULCERS (sore throat, sores in mouth, or a toothache) UNUSUAL RASH, SWELLING OR PAIN  UNUSUAL VAGINAL DISCHARGE OR ITCHING   Items with * indicate a potential emergency and should be followed up as soon as possible or go to the Emergency Department if any problems should occur.  Please show the CHEMOTHERAPY ALERT CARD or IMMUNOTHERAPY ALERT CARD at  check-in to the Emergency Department and triage nurse.  Should you have questions after your visit or need to cancel or reschedule your appointment, please contact [facility]  Dept: [phone]  and follow the prompts.  Office hours are 8:00 a.m. to 4:30 p.m. Monday - Friday. Please note that voicemails left after 4:00 p.m. may not be returned until the following business day.  We are closed weekends and major holidays. You have access to a nurse at all times for urgent questions. Please call the main number to the clinic Dept: [phone] and follow the prompts.   For any non-urgent questions, you may also contact your provider using MyChart. We now offer e-Visits for anyone 18 and older to request care online for non-urgent symptoms. For details visit [email or web address].   Also download the MyChart app! Go to the app store, search "MyChart", open the app, select [facility], and log in with your MyChart username and password.  Due to Covid, a mask is required upon entering the hospital/clinic. If you do not have a mask, one will be given to you upon arrival. For doctor visits, patients may have 1 support person aged 18 or older with them. For treatment visits, patients cannot have anyone with them due to current Covid guidelines and our immunocompromised population.  

## 2021-12-05 NOTE — Assessment & Plan Note (Signed)
09/15/2021:Palpable left breast mass x8 months. 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 Neoadjuvant chemotherapy withTaxotere and Cytoxan every 3 weeks x6 cycles 2. Followed by breast conserving surgery with targeted axillary dissectionvs ALND 3. Followed by adjuvant radiation therapy 4.Followed by adjuvant antiestrogen therapy with CDK 4 and 6 inhibitor CT CAP and bone scan: No distant metastatic disease ------------------------------------------------------------------------------------------------------------------------------------------ Current treatment: Cycle 3Taxotere Cytoxan Chemo toxicities: 1.Diffuse body pain due to Neulasta: With the next treatment I recommended that he take Motrin 2.mouth sores: No further mouth sores 3.Sore throat: Magic mouthwash resolved his sore throat 4.  Hair loss as expected  Did not have any nausea vomiting or diarrhea or constipation.    Return to clinic in 3 weeks for cycle 4

## 2021-12-05 NOTE — Telephone Encounter (Signed)
Provider asked this nurse about patient request of MD during today's visit about receipt of leave of absence paperwork.   No paperwork received per [employer] form tracker or other forms nurse.   Connected with YAIR DUSZA [phone]) to inquire.  Followed by further in person discussion and signature of ROI and office cover sheet.   Patient confirmed fax number [phone] as fax number he provided to [facility].   "Surgeon took me out of work 08/28/2021.  Work for [employer] so I may be able to return to work for thirty days and submit a new request or perhaps work from home." [place] has not yet received form and provider will be out of office effective 12/07/2021 for two weeks.  Continue communication with Dispensing optician for next steps and, follow per [place] and his leave qualifications.  Currently denies further questions or needs.

## 2021-12-07 ENCOUNTER — Telehealth: Payer: Self-pay | Admitting: *Deleted

## 2021-12-07 ENCOUNTER — Other Ambulatory Visit: Payer: Self-pay

## 2021-12-07 ENCOUNTER — Inpatient Hospital Stay: Payer: BC Managed Care – PPO | Attending: Hematology and Oncology

## 2021-12-07 VITALS — BP 141/87 | HR 63 | Temp 98.2°F | Resp 18

## 2021-12-07 DIAGNOSIS — Z5189 Encounter for other specified aftercare: Secondary | ICD-10-CM | POA: Diagnosis not present

## 2021-12-07 DIAGNOSIS — Z7952 Long term (current) use of systemic steroids: Secondary | ICD-10-CM | POA: Diagnosis not present

## 2021-12-07 DIAGNOSIS — C50422 Malignant neoplasm of upper-outer quadrant of left male breast: Secondary | ICD-10-CM | POA: Insufficient documentation

## 2021-12-07 DIAGNOSIS — Z7984 Long term (current) use of oral hypoglycemic drugs: Secondary | ICD-10-CM | POA: Insufficient documentation

## 2021-12-07 DIAGNOSIS — Z5111 Encounter for antineoplastic chemotherapy: Secondary | ICD-10-CM | POA: Diagnosis not present

## 2021-12-07 DIAGNOSIS — Z170 Estrogen receptor positive status [ER+]: Secondary | ICD-10-CM | POA: Insufficient documentation

## 2021-12-07 DIAGNOSIS — Z79899 Other long term (current) drug therapy: Secondary | ICD-10-CM | POA: Diagnosis not present

## 2021-12-07 DIAGNOSIS — C773 Secondary and unspecified malignant neoplasm of axilla and upper limb lymph nodes: Secondary | ICD-10-CM | POA: Insufficient documentation

## 2021-12-07 MED ORDER — PEGFILGRASTIM-CBQV 6 MG/0.6ML ~~LOC~~ SOSY
6.0000 mg | PREFILLED_SYRINGE | Freq: Once | SUBCUTANEOUS | Status: AC
  Administered 2021-12-07: 6 mg via SUBCUTANEOUS
  Filled 2021-12-07: qty 0.6

## 2021-12-07 NOTE — Telephone Encounter (Addendum)
The [place] form successfully faxed to [phone]. Original copy mailed to patient address on file. [place] [id number]  No further completed form delivery actions performed or further instructions received.

## 2021-12-11 ENCOUNTER — Ambulatory Visit: Payer: Self-pay | Admitting: Surgery

## 2021-12-11 DIAGNOSIS — C50912 Malignant neoplasm of unspecified site of left female breast: Secondary | ICD-10-CM

## 2021-12-11 DIAGNOSIS — C50922 Malignant neoplasm of unspecified site of left male breast: Secondary | ICD-10-CM | POA: Diagnosis not present

## 2021-12-12 ENCOUNTER — Encounter (HOSPITAL_COMMUNITY): Payer: BC Managed Care – PPO | Admitting: Cardiology

## 2021-12-12 ENCOUNTER — Encounter: Payer: Self-pay | Admitting: Hematology and Oncology

## 2021-12-12 ENCOUNTER — Encounter: Payer: Self-pay | Admitting: Cardiology

## 2021-12-12 ENCOUNTER — Other Ambulatory Visit (HOSPITAL_COMMUNITY): Payer: BC Managed Care – PPO

## 2021-12-12 NOTE — Progress Notes (Addendum)
Patient Care Team: Pcp, No as PCP - General Mauro Kaufmann, RN as Oncology Nurse Navigator Rockwell Germany, RN as Oncology Nurse Navigator Elease Hashimoto (Neurology)  DIAGNOSIS:  Encounter Diagnosis  Name Primary?   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 CT 08/27/2021 in ED showed 3.8 centimeter left breast mass with left axillary lymph nodes.Mamm and [place]: 4 cm mass with 5 axillary lymph nodes: Biopsy: Grade 2 IDC, lymph node: Positive, ER 100%, PR 50%, Ki-67 25%, HER2 negative ratio 1.25, copy #3.45   09/25/2021 Cancer Staging   Staging form: Breast, AJCC 8th Edition - Clinical: Stage IIA (cT2, cN1, cM0, G2, ER+, PR+, HER2-) - Signed by Nicholas Lose, MD on 09/25/2021 Histologic grading system: 3 grade system   10/03/2021 -  Chemotherapy   Patient is on Treatment Plan : BREAST TC q21d       CHIEF COMPLIANT: Day 1 cycle 5 Taxotere Cytoxan   INTERVAL HISTORY: Michael Stuart is a 41 y.o male with the above mention cycle 5 Taxotere cytoxan. States that his finger tips is not numb but tender. States that he has no energy. Complains of fatigue. 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 Complains of not remembering like he use too. States that he still have not lost weight.  ALLERGIES:  is allergic to bactrim [sulfamethoxazole-trimethoprim].  MEDICATIONS:  Current Outpatient Medications  Medication Sig Dispense Refill   acetaminophen (TYLENOL) 500 MG tablet Take 1,000 mg by mouth every 6 (six) hours as needed for moderate pain.     atorvastatin (LIPITOR) 20 MG tablet Take 1 tablet (20 mg total) by mouth daily. 30 tablet 3   dapagliflozin propanediol (FARXIGA) 10 MG TABS tablet Take 1 tablet by mouth daily. 90  tablet 2   dexamethasone (DECADRON) 4 MG tablet Take 1 tablet (4 mg total) by mouth daily. Take 1 tablet day before chemo and 1 tablet day after chemo with food 12 tablet 0   digoxin (LANOXIN) 0.125 MG tablet Take 1 tablet by mouth daily. 30 tablet 2   eplerenone (INSPRA) 50 MG tablet Take 1 tablet by mouth daily. 90 tablet 3   ivabradine (CORLANOR) 5 MG TABS tablet Take 1 tablet by mouth 2 times daily with a meal. 180 tablet 3   lidocaine-prilocaine (EMLA) cream Apply to affected area once daily as directed 30 g 2   LORazepam (ATIVAN) 0.5 MG tablet Take 1 tablet (0.5 mg total) by mouth every 6 (six) hours as needed (Nausea or vomiting). 30 tablet 0   magic mouthwash w/lidocaine SOLN Take 5 mLs by mouth 3 (three) times daily as needed for mouth pain. 120 mL 0   metoprolol succinate (TOPROL-XL) 50 MG 24 hr tablet Take 1/2 tablet (25 mg total) by mouth daily. 90 tablet 3   ondansetron (ZOFRAN) 8 MG tablet Take 1 tablet (8 mg total) by mouth 2 (two) times daily as needed for refractory nausea / vomiting. Start on day 3 after chemo. 30 tablet 0   prochlorperazine (COMPAZINE) 10 MG tablet Take 1 tablet (10 mg total) by mouth every 6 (six) hours as needed (Nausea or vomiting). 30 tablet 0   sacubitril-valsartan (ENTRESTO) 49-51 MG Take 1 tablet by mouth 2 times daily. 60 tablet 3  No current facility-administered medications for this visit.   Facility-Administered Medications Ordered in Other Visits  Medication Dose Route Frequency Provider Last Rate Last Admin   sodium chloride flush (NS) 0.9 % injection 10 mL  10 mL Intracatheter PRN Nicholas Lose, MD   10 mL at 12/26/21 1350    PHYSICAL EXAMINATION: ECOG PERFORMANCE STATUS: 1 - Symptomatic but completely ambulatory  Vitals:   12/26/21 1008  BP: (!) 141/81  Pulse: 94  Resp: 17  Temp: (!) 97.3 F (36.3 C)  SpO2: 100%   Filed Weights   12/26/21 1008  Weight: 268 lb 6.4 oz (121.7 kg)      LABORATORY DATA:  I have reviewed the data as  listed    Latest Ref Rng & Units 12/26/2021    9:52 AM 12/05/2021    9:59 AM 11/13/2021    9:58 AM  CMP  Glucose 70 - 99 mg/dL 102  148  140   BUN 6 - 20 mg/dL 9  11  13    Creatinine 0.61 - 1.24 mg/dL 0.81  0.67  0.77   Sodium 135 - 145 mmol/L 139  137  137   Potassium 3.5 - 5.1 mmol/L 3.6  4.2  4.4   Chloride 98 - 111 mmol/L 106  106  107   CO2 22 - 32 mmol/L 27  24  24    Calcium 8.9 - 10.3 mg/dL 9.3  9.3  9.2   Total Protein 6.5 - 8.1 g/dL 6.8  7.0  7.1   Total Bilirubin 0.3 - 1.2 mg/dL 0.3  0.4  0.5   Alkaline Phos 38 - 126 U/L 57  55  64   AST 15 - 41 U/L 18  15  16    ALT 0 - 44 U/L 17  16  18      Lab Results  Component Value Date   WBC 11.7 (H) 12/26/2021   HGB 12.0 (L) 12/26/2021   HCT 36.2 (L) 12/26/2021   MCV 99.7 12/26/2021   PLT 339 12/26/2021   NEUTROABS 7.5 12/26/2021    ASSESSMENT & PLAN:  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 CT 08/27/2021 in ED showed 3.8 cm left breast mass with left axillary lymph nodes.Mamm and [place]: 4 cm mass with 5 axillary lymph nodes: Biopsy: Grade 2 IDC, lymph node: Positive, ER 100%, PR 50%, Ki-67 25%, HER2 negative ratio 1.25, copy #3.45 (Recent history of coronary artery disease EF 22% and stroke)   Treatment plan: 1. Neoadjuvant chemotherapy with Taxotere and Cytoxan every 3 weeks x6 cycles 2. Followed by breast conserving surgery with targeted axillary dissection vs ALND 3. Followed by adjuvant radiation therapy 4.  Followed by adjuvant antiestrogen therapy with CDK 4 and 6 inhibitor CT CAP and bone scan: No distant metastatic disease ------------------------------------------------------------------------------------------------------------------------------------------ Current treatment: Cycle 5 Taxotere Cytoxan Chemo toxicities: 1.  Diffuse body pain due to Neulasta:  2. sensitive to the tips of the fingers for couple of  days after each chemo 3.  Hair loss as expected 4.  Nail changes 5.  Worsening fatigue  Return to clinic in 3 weeks for cycle 6 His surgery has been set up in August. He needs genetic testing and I will reach out to Santiago Glad to see if it can be done soon.    No orders of the defined types were placed in this encounter.  The patient has a good understanding of the overall plan. he  agrees with it. he will call with any problems that may develop before the next visit here. Total time spent: 30 mins including face to face time and time spent for planning, charting and co-ordination of care   Harriette Ohara, MD 12/26/21    I Gardiner Coins am scribing for Dr. Lindi Adie  I have reviewed the above documentation for accuracy and completeness, and I agree with the above.

## 2021-12-13 DIAGNOSIS — Z170 Estrogen receptor positive status [ER+]: Secondary | ICD-10-CM | POA: Insufficient documentation

## 2021-12-13 DIAGNOSIS — I513 Intracardiac thrombosis, not elsewhere classified: Secondary | ICD-10-CM | POA: Insufficient documentation

## 2021-12-13 NOTE — Progress Notes (Signed)
Follow up visit  Subjective:   Michael Stuart, male    DOB: 03-19-1981, 41 y.o.   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  He has had side effects related to chemotherapy, but is tolerating fairly okay.  He is completed 4 cycles, and has 2 more cycles to go.  There is tentative plan to perform mastectomy in August, followed by 30 days of radiation.  Patient has not been able to afford Corlanor due to pricing issues.  He was told by pharmacist that there is generic option available for $15.  Discharge summary 08/31/2021: Patient has had retracted nipple on left side for almost a year, but did not seek treatment.  He had respiratory viral illness around December 2022, and has had progressive dyspnea since then.  He underwent coronary angiography showed no significant coronary artery disease.  Cardiac MRI showed possible myocarditis.  Patient responded to IV diuresis and initiation of heart failure management with significant improvement in his dyspnea symptoms.  He was also found to have apical thrombus secondary to systolic heart failure.  He was started on heparin/warfarin bridge.  Given slow increase in his INR levels, he was discharged on Lovenox/warfarin bridge.  He was able to ambulate on the day of discharge without any significant symptoms. He was also seen by heart failure specialist Dr. Aundra Dubin.  LifeVest was arranged given his risk of ventricular arrhythmia.  Oncology was contacted, and is scheduled to see Dr. Lindi Adie on 09/11/2021.    Patient was re-admitted to the hospital the next day after acute onset vision loss in right eye  and facial numbness. CT/MRI showed multifocal small strokes with petechial hemorrhage, detailed below. He was seen by neurology and recommended DOAC given stroke in spite of being on warfarin/lovenox. He was recommended DVT doe eliquis 10 mg bid till 09/07/2021, followed by 5 mg bid, by Dr. Einar Gip. He was discharged a day later.  Patient is here today with his mother. Rt eye partial vision loss is improving, facial numbness is resolved. His breathing is much improved. He was able to walk a mile up the hill without any significant difficulty. He is compliant with his medical therapy. He has f/u scheduled with oncology and heart failure clinic in next 2 weeks.     Current Outpatient Medications:    acetaminophen (TYLENOL) 500 MG tablet, Take 1,000 mg by mouth every 6 (six) hours as needed for moderate pain., Disp: , Rfl:    apixaban (ELIQUIS) 5 MG TABS tablet, Take 1 tablet by mouth 2  times daily., Disp: 180 tablet, Rfl: 1   atorvastatin (LIPITOR) 20 MG tablet, Take 1 tablet (20 mg total) by mouth daily., Disp: 30 tablet, Rfl: 3   dapagliflozin propanediol (FARXIGA) 10 MG TABS tablet, Take 1 tablet by mouth daily., Disp: 90 tablet, Rfl: 2   dexamethasone (DECADRON) 4 MG tablet, Take 1 tablet (4 mg total) by mouth daily. Take 1 tablet day before chemo and 1 tablet day after chemo with food, Disp: 12 tablet, Rfl: 0   digoxin (LANOXIN) 0.125 MG tablet, Take 1 tablet by mouth daily., Disp: 30 tablet, Rfl: 2   eplerenone (  INSPRA) 25 MG tablet, Take 2 tablets by mouth daily., Disp: 180 tablet, Rfl: 2   ivabradine (CORLANOR) 5 MG TABS tablet, Take 1 tablet (5 mg total) by mouth 2 (two) times daily with a meal., Disp: 60 tablet, Rfl: 3   lidocaine-prilocaine (EMLA) cream, Apply to affected area once daily as directed, Disp: 30 g, Rfl: 2   LORazepam (ATIVAN) 0.5 MG tablet, Take 1 tablet (0.5 mg total) by mouth every 6 (six) hours as needed (Nausea or vomiting)., Disp: 30 tablet, Rfl: 0   magic mouthwash  w/lidocaine SOLN, Take 5 mLs by mouth 3 (three) times daily as needed for mouth pain., Disp: 120 mL, Rfl: 0   metoprolol succinate (TOPROL-XL) 25 MG 24 hr tablet, Take 1 tablet by mouth daily., Disp: 90 tablet, Rfl: 2   ondansetron (ZOFRAN) 8 MG tablet, Take 1 tablet (8 mg total) by mouth 2 (two) times daily as needed for refractory nausea / vomiting. Start on day 3 after chemo., Disp: 30 tablet, Rfl: 0   prochlorperazine (COMPAZINE) 10 MG tablet, Take 1 tablet (10 mg total) by mouth every 6 (six) hours as needed (Nausea or vomiting)., Disp: 30 tablet, Rfl: 0   sacubitril-valsartan (ENTRESTO) 49-51 MG, Take 1 tablet by mouth 2 times daily., Disp: 60 tablet, Rfl: 3   Cardiovascular & other pertient studies:  Reviewed external labs and tests, independently interpreted  EKG 12/14/2021: Sinus tachycardia 114 bpm  Left atrial enlargement  Echocardiogram 11/21/2021:  Left ventricle cavity is normal in size. Moderate concentric hypertrophy  of the left ventricle. Normal global wall motion. Normal LV systolic  function with visual EF 50-55%. Doppler evidence of grade I (impaired)  diastolic dysfunction, normal LAP. No significant valvular abnormality.  Normal right atrial pressure.  Previous study on 08/27/2021 noted the following findings, which are now  absent: Dilated LV/RV with LVEF 20-25%. LV apical thrombus. Dilated RA/RV.  Mild MR.   MRI brain 09/01/2021: Patchy acute and subacute infarcts as described. Largest area of involvement is in the left frontal lobe corresponding to abnormality on CT where there is some petechial hemorrhage. Involvement of multiple vascular territories suggesting a central source. No intracranial mass.  LHC/RHC 08/28/2021: Normal coronary arteries without coronary artery disease   RA: 10 mmHg RV: 49/4 mmHg PA: 44/24 mmHg, mPAP 34 mmHg PCW: 22 mmHg   CO: 5.7 L/min CI: 2.3 L/min/m2   Decompensated nonischemic cardiomyopathy Resume IV heparin 2 hours after  TR band is off given LV apical thrombus GDMT for heart failure and continued workup for breast mass  Echocardiogram 08/27/2021: 1. Left ventricular ejection fraction, by estimation, is 20 to 25%. The  left ventricle has severely decreased function. The left ventricle  demonstrates global hypokinesis. The left ventricular internal cavity size  was dilated. Left ventricular diastolic   parameters are consistent with Grade III diastolic dysfunction  (restrictive). Elevated left ventricular end-diastolic pressure.   2. Right ventricular systolic function is hyperdynamic. The right  ventricular size is mildly enlarged. dilated.   3. Left atrial size was mild to moderately dilated.   4. Right atrial size was dilated.   5. A small pericardial effusion is present. The pericardial effusion is  posterior to the left ventricle.   6. The mitral valve is grossly normal. Mild mitral valve regurgitation.  No evidence of mitral stenosis.   7. The aortic valve is tricuspid. Aortic valve regurgitation is not  visualized. No aortic stenosis is present.   8. There is mild dilatation of the ascending  aorta, measuring 40 mm.   9. The inferior vena cava is dilated in size with >50% respiratory  variability, suggesting right atrial pressure of 8 mmHg.   Recent labs: 12/05/2021: Glucose 148, BUN/Cr 11/0.67. EGFR >60. Na/K 137/4.2. Rest of the CMP normal H/H 13/38. MCV 94. Platelets 384  09/02/2021: Glucose 134, BUN/Cr 12/0.96. EGFR >60. Na/K 137/4.9. Albumin 3.4. ALT 46. Rest of the CMP normal H/H 16/48. MCV 91 . Platelets 232 HbA1C 5.6% Chol 141, TG 109, HDL 40, LDL 79 TSH 0.3 normal    Review of Systems  Eyes:  Positive for vision loss in right eye (Partial).  Cardiovascular:  Negative for chest pain, dyspnea on exertion, leg swelling, palpitations and syncope.        Vitals:   12/14/21 0936  BP: (!) 142/94  Pulse: (!) 119  Resp: 16  Temp: (!) 97.5 F (36.4 C)  SpO2: 96%     Body mass  index is 33 kg/m. Filed Weights   12/14/21 0936  Weight: 264 lb (119.7 kg)     Objective:   Physical Exam Vitals and nursing note reviewed.  Constitutional:      General: He is not in acute distress. Neck:     Vascular: No JVD.  Cardiovascular:     Rate and Rhythm: Normal rate and regular rhythm.     Heart sounds: Normal heart sounds. No murmur heard. Pulmonary:     Effort: Pulmonary effort is normal.     Breath sounds: Normal breath sounds. No wheezing or rales.  Musculoskeletal:     Right lower leg: No edema.     Left lower leg: No edema.            Visit diagnoses: No diagnosis found.    No orders of the defined types were placed in this encounter.    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 H/o viral illness in December 2022. Clinically improving, now NYHA class I-II. Currently on Entresto 49-51 mg bid, eplerenone 50 mg daily, metoprolol succinate 25 mg daily, Farxiga 10 mg daily, digoxin 0.125 mg daily. Not able to afford Corlanor 5 mg bid. EF increased to 50-55% (11/2021).  Given persistent tachycardia, without any obvious acute heart failure symptoms, I have increased metoprolol succinate to 50 mg daily, and started generic ivabradine 5 mg twice daily.  With ongoing chemotherapy including cyclophosphamide, one needs to be careful about monitoring his LVEF.   We will repeat echocardiogram in Nov 2023.    I anticipate that if he continues to do well from heart failure standpoint, we could consider down titrating his heart failure medications sometime in 2024, after he has completed chemotherapy, mastectomy,  and radiation.  Cardioembolic stroke: Multifocal strokes secondary to LV apical thrombus, with mild petechial hemorrhage (08/2021), while on warfarin, thus on eliquis since then.  Patient has completed 3 months of anticoagulation post stroke that occurred in the setting of systolic heart failure and LV thrombus. LVEF has now recovered. I reckon we could discontinue anticoagulation now. I will discuss with heart failure ane Neurology teams.   While the following recommendation is not guideline based, I think this is reasonable approach given stroke while on lovenox/warfarin. His risk of cardioembolic source  remains high without adequate anticoagulation at least for 3 months (till end of June). Continue eliquis 5 mg bid.  Breast cancer: Continue management as per Dr. Lindi Adie Plan for mastectomy sometime in 02/2022  F/u in 4 weeks    Nigel Mormon, MD Pager: [phone] Office: [phone]

## 2021-12-14 ENCOUNTER — Other Ambulatory Visit (HOSPITAL_COMMUNITY): Payer: Self-pay

## 2021-12-14 ENCOUNTER — Ambulatory Visit: Payer: BC Managed Care – PPO | Admitting: Cardiology

## 2021-12-14 ENCOUNTER — Encounter: Payer: Self-pay | Admitting: Cardiology

## 2021-12-14 VITALS — BP 142/94 | HR 119 | Temp 97.5°F | Resp 16 | Ht 75.0 in | Wt 264.0 lb

## 2021-12-14 DIAGNOSIS — Z8673 Personal history of transient ischemic attack (TIA), and cerebral infarction without residual deficits: Secondary | ICD-10-CM

## 2021-12-14 DIAGNOSIS — Z170 Estrogen receptor positive status [ER+]: Secondary | ICD-10-CM

## 2021-12-14 DIAGNOSIS — I5022 Chronic systolic (congestive) heart failure: Secondary | ICD-10-CM

## 2021-12-14 DIAGNOSIS — C50922 Malignant neoplasm of unspecified site of left male breast: Secondary | ICD-10-CM | POA: Diagnosis not present

## 2021-12-14 DIAGNOSIS — I513 Intracardiac thrombosis, not elsewhere classified: Secondary | ICD-10-CM

## 2021-12-14 MED ORDER — IVABRADINE HCL 5 MG PO TABS
5.0000 mg | ORAL_TABLET | Freq: Two times a day (BID) | ORAL | 3 refills | Status: DC
  Filled 2021-12-14 – 2021-12-19 (×2): qty 60, 30d supply, fill #0

## 2021-12-14 MED ORDER — EPLERENONE 50 MG PO TABS
50.0000 mg | ORAL_TABLET | Freq: Every day | ORAL | 3 refills | Status: DC
  Filled 2021-12-14: qty 30, 30d supply, fill #0

## 2021-12-14 MED ORDER — METOPROLOL SUCCINATE ER 50 MG PO TB24
25.0000 mg | ORAL_TABLET | Freq: Every day | ORAL | 3 refills | Status: DC
Start: 2021-12-14 — End: 2022-01-11
  Filled 2021-12-14: qty 15, 30d supply, fill #0

## 2021-12-15 ENCOUNTER — Other Ambulatory Visit (HOSPITAL_COMMUNITY): Payer: Self-pay

## 2021-12-15 ENCOUNTER — Other Ambulatory Visit: Payer: Self-pay | Admitting: Surgery

## 2021-12-15 ENCOUNTER — Telehealth: Payer: Self-pay | Admitting: Cardiology

## 2021-12-15 DIAGNOSIS — C50912 Malignant neoplasm of unspecified site of left female breast: Secondary | ICD-10-CM

## 2021-12-15 DIAGNOSIS — Z8673 Personal history of transient ischemic attack (TIA), and cerebral infarction without residual deficits: Secondary | ICD-10-CM

## 2021-12-15 NOTE — Telephone Encounter (Signed)
Please let him know that eliquis can be stopped.  Thanks MJP

## 2021-12-15 NOTE — Telephone Encounter (Signed)
Called patient, NA, LMAM

## 2021-12-19 ENCOUNTER — Other Ambulatory Visit (HOSPITAL_COMMUNITY): Payer: Self-pay

## 2021-12-19 NOTE — Telephone Encounter (Signed)
Called and spoke to pt, pt voiced understanding.

## 2021-12-20 ENCOUNTER — Other Ambulatory Visit (HOSPITAL_COMMUNITY): Payer: Self-pay

## 2021-12-22 ENCOUNTER — Ambulatory Visit: Payer: BC Managed Care – PPO | Admitting: Physician Assistant

## 2021-12-25 MED FILL — Fosaprepitant Dimeglumine For IV Infusion 150 MG (Base Eq): INTRAVENOUS | Qty: 5 | Status: AC

## 2021-12-25 MED FILL — Dexamethasone Sodium Phosphate Inj 100 MG/10ML: INTRAMUSCULAR | Qty: 1 | Status: AC

## 2021-12-26 ENCOUNTER — Inpatient Hospital Stay: Payer: BC Managed Care – PPO

## 2021-12-26 ENCOUNTER — Other Ambulatory Visit: Payer: Self-pay

## 2021-12-26 ENCOUNTER — Other Ambulatory Visit: Payer: Self-pay | Admitting: Genetic Counselor

## 2021-12-26 ENCOUNTER — Inpatient Hospital Stay (HOSPITAL_BASED_OUTPATIENT_CLINIC_OR_DEPARTMENT_OTHER): Payer: BC Managed Care – PPO | Admitting: Hematology and Oncology

## 2021-12-26 DIAGNOSIS — C773 Secondary and unspecified malignant neoplasm of axilla and upper limb lymph nodes: Secondary | ICD-10-CM | POA: Diagnosis not present

## 2021-12-26 DIAGNOSIS — Z170 Estrogen receptor positive status [ER+]: Secondary | ICD-10-CM

## 2021-12-26 DIAGNOSIS — Z79899 Other long term (current) drug therapy: Secondary | ICD-10-CM | POA: Diagnosis not present

## 2021-12-26 DIAGNOSIS — Z5111 Encounter for antineoplastic chemotherapy: Secondary | ICD-10-CM | POA: Diagnosis not present

## 2021-12-26 DIAGNOSIS — Z7984 Long term (current) use of oral hypoglycemic drugs: Secondary | ICD-10-CM | POA: Diagnosis not present

## 2021-12-26 DIAGNOSIS — Z95828 Presence of other vascular implants and grafts: Secondary | ICD-10-CM

## 2021-12-26 DIAGNOSIS — C50422 Malignant neoplasm of upper-outer quadrant of left male breast: Secondary | ICD-10-CM | POA: Diagnosis not present

## 2021-12-26 DIAGNOSIS — Z7952 Long term (current) use of systemic steroids: Secondary | ICD-10-CM | POA: Diagnosis not present

## 2021-12-26 DIAGNOSIS — Z5189 Encounter for other specified aftercare: Secondary | ICD-10-CM | POA: Diagnosis not present

## 2021-12-26 LAB — CMP (CANCER CENTER ONLY)
ALT: 17 U/L (ref 0–44)
AST: 18 U/L (ref 15–41)
Albumin: 4.2 g/dL (ref 3.5–5.0)
Alkaline Phosphatase: 57 U/L (ref 38–126)
Anion gap: 6 (ref 5–15)
BUN: 9 mg/dL (ref 6–20)
CO2: 27 mmol/L (ref 22–32)
Calcium: 9.3 mg/dL (ref 8.9–10.3)
Chloride: 106 mmol/L (ref 98–111)
Creatinine: 0.81 mg/dL (ref 0.61–1.24)
GFR, Estimated: >60 mL/min (ref >60)
Glucose, Bld: 102 mg/dL — ABNORMAL HIGH (ref 70–99)
Potassium: 3.6 mmol/L (ref 3.5–5.1)
Sodium: 139 mmol/L (ref 135–145)
Total Bilirubin: 0.3 mg/dL (ref 0.3–1.2)
Total Protein: 6.8 g/dL (ref 6.5–8.1)

## 2021-12-26 LAB — CBC WITH DIFFERENTIAL (CANCER CENTER ONLY)
Abs Immature Granulocytes: 0.05 10*3/uL (ref 0.00–0.07)
Basophils Absolute: 0.1 10*3/uL (ref 0.0–0.1)
Basophils Relative: 1 %
Eosinophils Absolute: 0 10*3/uL (ref 0.0–0.5)
Eosinophils Relative: 0 %
HCT: 36.2 % — ABNORMAL LOW (ref 39.0–52.0)
Hemoglobin: 12 g/dL — ABNORMAL LOW (ref 13.0–17.0)
Immature Granulocytes: 0 %
Lymphocytes Relative: 23 %
Lymphs Abs: 2.6 10*3/uL (ref 0.7–4.0)
MCH: 33.1 pg (ref 26.0–34.0)
MCHC: 33.1 g/dL (ref 30.0–36.0)
MCV: 99.7 fL (ref 80.0–100.0)
Monocytes Absolute: 1.5 10*3/uL — ABNORMAL HIGH (ref 0.1–1.0)
Monocytes Relative: 13 %
Neutro Abs: 7.5 10*3/uL (ref 1.7–7.7)
Neutrophils Relative %: 63 %
Platelet Count: 339 10*3/uL (ref 150–400)
RBC: 3.63 MIL/uL — ABNORMAL LOW (ref 4.22–5.81)
RDW: 16.5 % — ABNORMAL HIGH (ref 11.5–15.5)
WBC Count: 11.7 10*3/uL — ABNORMAL HIGH (ref 4.0–10.5)
nRBC: 0 % (ref 0.0–0.2)

## 2021-12-26 MED ORDER — SODIUM CHLORIDE 0.9% FLUSH
10.0000 mL | Freq: Once | INTRAVENOUS | Status: AC
  Administered 2021-12-26: 10 mL

## 2021-12-26 MED ORDER — SODIUM CHLORIDE 0.9 % IV SOLN
600.0000 mg/m2 | Freq: Once | INTRAVENOUS | Status: AC
  Administered 2021-12-26: 1480 mg via INTRAVENOUS
  Filled 2021-12-26: qty 74

## 2021-12-26 MED ORDER — PALONOSETRON HCL INJECTION 0.25 MG/5ML
0.2500 mg | Freq: Once | INTRAVENOUS | Status: AC
  Administered 2021-12-26: 0.25 mg via INTRAVENOUS
  Filled 2021-12-26: qty 5

## 2021-12-26 MED ORDER — SODIUM CHLORIDE 0.9 % IV SOLN
150.0000 mg | Freq: Once | INTRAVENOUS | Status: AC
  Administered 2021-12-26: 150 mg via INTRAVENOUS
  Filled 2021-12-26: qty 150

## 2021-12-26 MED ORDER — SODIUM CHLORIDE 0.9 % IV SOLN: Freq: Once | INTRAVENOUS | Status: AC

## 2021-12-26 MED ORDER — SODIUM CHLORIDE 0.9% FLUSH
10.0000 mL | INTRAVENOUS | Status: DC | PRN
  Administered 2021-12-26: 10 mL

## 2021-12-26 MED ORDER — HEPARIN SOD (PORK) LOCK FLUSH 100 UNIT/ML IV SOLN
500.0000 [IU] | Freq: Once | INTRAVENOUS | Status: AC | PRN
  Administered 2021-12-26: 500 [IU]

## 2021-12-26 MED ORDER — SODIUM CHLORIDE 0.9 % IV SOLN
75.0000 mg/m2 | Freq: Once | INTRAVENOUS | Status: AC
  Administered 2021-12-26: 190 mg via INTRAVENOUS
  Filled 2021-12-26: qty 19

## 2021-12-26 MED ORDER — SODIUM CHLORIDE 0.9 % IV SOLN
10.0000 mg | Freq: Once | INTRAVENOUS | Status: AC
  Administered 2021-12-26: 10 mg via INTRAVENOUS
  Filled 2021-12-26: qty 10

## 2021-12-26 NOTE — Assessment & Plan Note (Addendum)
09/15/2021:Palpable left breast mass x8 months. 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 Neoadjuvant chemotherapy withTaxotere and Cytoxan every 3 weeks x6 cycles 2. Followed by breast conserving surgery with targeted axillary dissectionvs ALND 3. Followed by adjuvant radiation therapy 4.Followed by adjuvant antiestrogen therapy with CDK 4 and 6 inhibitor CT CAP and bone scan: No distant metastatic disease ------------------------------------------------------------------------------------------------------------------------------------------ Current treatment: Cycle5Taxotere Cytoxan Chemo toxicities: 1.Diffuse body pain due to Neulasta:  2. sensitive to the tips of the fingers for couple of days after each chemo 3.Hair loss as expected 4.  Nail changes 5.  Worsening fatigue  Return to clinic in3weeks for cycle 6 His surgery has been set up in August. He needs genetic testing and I will reach out to Santiago Glad to see if it can be done soon.

## 2021-12-26 NOTE — Patient Instructions (Signed)
[facility]  Discharge Instructions: Thank you for choosing [facility] to provide your oncology and hematology care.   If you have a lab appointment with the Cancer Center, please go directly to the Cancer Center and check in at the registration area.   Wear comfortable clothing and clothing appropriate for easy access to any Portacath or PICC line.   We strive to give you quality time with your provider. You may need to reschedule your appointment if you arrive late (15 or more minutes).  Arriving late affects you and other patients whose appointments are after yours.  Also, if you miss three or more appointments without notifying the office, you may be dismissed from the clinic at the provider's discretion.      For prescription refill requests, have your pharmacy contact our office and allow 72 hours for refills to be completed.    Today you received the following chemotherapy and/or immunotherapy agents: Docetaxel, Cytoxan.       To help prevent nausea and vomiting after your treatment, we encourage you to take your nausea medication as directed.  BELOW ARE SYMPTOMS THAT SHOULD BE REPORTED IMMEDIATELY: *FEVER GREATER THAN 100.4 F (38 C) OR HIGHER *CHILLS OR SWEATING *NAUSEA AND VOMITING THAT IS NOT CONTROLLED WITH YOUR NAUSEA MEDICATION *UNUSUAL SHORTNESS OF BREATH *UNUSUAL BRUISING OR BLEEDING *URINARY PROBLEMS (pain or burning when urinating, or frequent urination) *BOWEL PROBLEMS (unusual diarrhea, constipation, pain near the anus) TENDERNESS IN MOUTH AND THROAT WITH OR WITHOUT PRESENCE OF ULCERS (sore throat, sores in mouth, or a toothache) UNUSUAL RASH, SWELLING OR PAIN  UNUSUAL VAGINAL DISCHARGE OR ITCHING   Items with * indicate a potential emergency and should be followed up as soon as possible or go to the Emergency Department if any problems should occur.  Please show the CHEMOTHERAPY ALERT CARD or IMMUNOTHERAPY ALERT CARD at  check-in to the Emergency Department and triage nurse.  Should you have questions after your visit or need to cancel or reschedule your appointment, please contact [facility]  Dept: [phone]  and follow the prompts.  Office hours are 8:00 a.m. to 4:30 p.m. Monday - Friday. Please note that voicemails left after 4:00 p.m. may not be returned until the following business day.  We are closed weekends and major holidays. You have access to a nurse at all times for urgent questions. Please call the main number to the clinic Dept: [phone] and follow the prompts.   For any non-urgent questions, you may also contact your provider using MyChart. We now offer e-Visits for anyone 18 and older to request care online for non-urgent symptoms. For details visit [email or web address].   Also download the MyChart app! Go to the app store, search "MyChart", open the app, select [facility], and log in with your MyChart username and password.  Due to Covid, a mask is required upon entering the hospital/clinic. If you do not have a mask, one will be given to you upon arrival. For doctor visits, patients may have 1 support Michael Stuart aged 18 or older with them. For treatment visits, patients cannot have anyone with them due to current Covid guidelines and our immunocompromised population.  

## 2021-12-28 ENCOUNTER — Other Ambulatory Visit: Payer: Self-pay

## 2021-12-28 ENCOUNTER — Inpatient Hospital Stay: Payer: BC Managed Care – PPO

## 2021-12-28 VITALS — BP 152/90 | HR 74 | Temp 98.3°F | Resp 17

## 2021-12-28 DIAGNOSIS — Z95828 Presence of other vascular implants and grafts: Secondary | ICD-10-CM

## 2021-12-28 DIAGNOSIS — Z5189 Encounter for other specified aftercare: Secondary | ICD-10-CM | POA: Diagnosis not present

## 2021-12-28 DIAGNOSIS — C773 Secondary and unspecified malignant neoplasm of axilla and upper limb lymph nodes: Secondary | ICD-10-CM | POA: Diagnosis not present

## 2021-12-28 DIAGNOSIS — C50422 Malignant neoplasm of upper-outer quadrant of left male breast: Secondary | ICD-10-CM | POA: Diagnosis not present

## 2021-12-28 DIAGNOSIS — Z170 Estrogen receptor positive status [ER+]: Secondary | ICD-10-CM | POA: Diagnosis not present

## 2021-12-28 DIAGNOSIS — Z79899 Other long term (current) drug therapy: Secondary | ICD-10-CM | POA: Diagnosis not present

## 2021-12-28 DIAGNOSIS — Z7952 Long term (current) use of systemic steroids: Secondary | ICD-10-CM | POA: Diagnosis not present

## 2021-12-28 DIAGNOSIS — C50912 Malignant neoplasm of unspecified site of left female breast: Secondary | ICD-10-CM

## 2021-12-28 DIAGNOSIS — Z5111 Encounter for antineoplastic chemotherapy: Secondary | ICD-10-CM | POA: Diagnosis not present

## 2021-12-28 DIAGNOSIS — Z7984 Long term (current) use of oral hypoglycemic drugs: Secondary | ICD-10-CM | POA: Diagnosis not present

## 2021-12-28 MED ORDER — SODIUM CHLORIDE 0.9% FLUSH
10.0000 mL | Freq: Once | INTRAVENOUS | Status: AC
  Administered 2021-12-28: 10 mL

## 2021-12-28 MED ORDER — PEGFILGRASTIM-CBQV 6 MG/0.6ML ~~LOC~~ SOSY
6.0000 mg | PREFILLED_SYRINGE | Freq: Once | SUBCUTANEOUS | Status: AC
  Administered 2021-12-28: 6 mg via SUBCUTANEOUS
  Filled 2021-12-28: qty 0.6

## 2021-12-28 MED ORDER — HEPARIN SOD (PORK) LOCK FLUSH 100 UNIT/ML IV SOLN
500.0000 [IU] | Freq: Once | INTRAVENOUS | Status: AC
  Administered 2021-12-28: 500 [IU]

## 2021-12-29 LAB — GENETIC SCREENING ORDER

## 2022-01-09 ENCOUNTER — Other Ambulatory Visit: Payer: Self-pay | Admitting: Cardiology

## 2022-01-10 ENCOUNTER — Other Ambulatory Visit (HOSPITAL_COMMUNITY): Payer: Self-pay

## 2022-01-10 ENCOUNTER — Encounter: Payer: Self-pay | Admitting: Hematology and Oncology

## 2022-01-10 MED ORDER — ATORVASTATIN CALCIUM 20 MG PO TABS
20.0000 mg | ORAL_TABLET | Freq: Every day | ORAL | 3 refills | Status: DC
  Filled 2022-01-10: qty 30, 30d supply, fill #0

## 2022-01-11 ENCOUNTER — Encounter: Payer: Self-pay | Admitting: Cardiology

## 2022-01-11 ENCOUNTER — Ambulatory Visit: Payer: BC Managed Care – PPO | Admitting: Cardiology

## 2022-01-11 VITALS — BP 124/86 | HR 95 | Temp 98.7°F | Resp 16 | Ht 75.0 in | Wt 268.0 lb

## 2022-01-11 DIAGNOSIS — I5022 Chronic systolic (congestive) heart failure: Secondary | ICD-10-CM | POA: Diagnosis not present

## 2022-01-11 DIAGNOSIS — Z8673 Personal history of transient ischemic attack (TIA), and cerebral infarction without residual deficits: Secondary | ICD-10-CM | POA: Diagnosis not present

## 2022-01-11 MED ORDER — METOPROLOL SUCCINATE ER 50 MG PO TB24
50.0000 mg | ORAL_TABLET | Freq: Every day | ORAL | 3 refills | Status: DC
Start: 2022-01-11 — End: 2022-05-11

## 2022-01-11 NOTE — Progress Notes (Signed)
Follow up visit  Subjective:   Michael Stuart, male    DOB: 1981/02/27, 41 y.o.   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  He has had side effects related to chemotherapy, but is tolerating fairly okay.  He is completed 5 cycles, and has 1 more cycle to go. He has had a lot of GI and fatigue side effects with chemotherapy and hoping to He is scheduled to undergo mastectomy on 02/21/2022 by Dr. Brantley Stage  Patient has not been able to afford Corlanor due to pricing issues.    Current Outpatient Medications:    acetaminophen (TYLENOL) 500 MG tablet, Take 1,000 mg by mouth every 6 (six) hours as needed for moderate pain., Disp: , Rfl:    atorvastatin (LIPITOR) 20 MG tablet, Take 1 tablet (20 mg total) by mouth daily., Disp: 30 tablet, Rfl: 3   dapagliflozin propanediol (FARXIGA) 10 MG TABS tablet, Take 1 tablet by mouth daily., Disp: 90 tablet, Rfl: 2   dexamethasone (DECADRON) 4 MG tablet, Take 1 tablet (4 mg total) by mouth daily. Take 1 tablet day before chemo and 1 tablet day after chemo with food, Disp: 12 tablet, Rfl: 0   digoxin (LANOXIN) 0.125 MG tablet, Take 1 tablet by mouth daily., Disp: 30 tablet, Rfl: 2   eplerenone (INSPRA) 50 MG tablet, Take 1 tablet by mouth daily., Disp: 90 tablet, Rfl: 3   ivabradine (CORLANOR) 5 MG TABS tablet, Take 1 tablet by mouth 2 times daily with a meal., Disp: 180 tablet, Rfl: 3   lidocaine-prilocaine (EMLA) cream, Apply to affected area once daily as directed, Disp: 30 g, Rfl: 2   LORazepam (ATIVAN) 0.5 MG tablet, Take 1 tablet (0.5 mg total) by mouth every 6  (six) hours as needed (Nausea or vomiting)., Disp: 30 tablet, Rfl: 0   magic mouthwash w/lidocaine SOLN, Take 5 mLs by mouth 3 (three) times daily as needed for mouth pain., Disp: 120 mL, Rfl: 0   metoprolol succinate (TOPROL-XL) 50 MG 24 hr tablet, Take 1/2 tablet (25 mg total) by mouth daily., Disp: 90 tablet, Rfl: 3   ondansetron (ZOFRAN) 8 MG tablet, Take 1 tablet (8 mg total) by mouth 2 (two) times daily as needed for refractory nausea / vomiting. Start on day 3 after chemo., Disp: 30 tablet, Rfl: 0   prochlorperazine (COMPAZINE) 10 MG tablet, Take 1 tablet (10 mg total) by mouth every 6 (six) hours as needed (Nausea or vomiting)., Disp: 30 tablet, Rfl: 0   sacubitril-valsartan (ENTRESTO) 49-51 MG, Take 1 tablet by mouth 2 times daily., Disp: 60 tablet, Rfl: 3   Cardiovascular & other pertient studies:  Reviewed external labs and tests, independently interpreted  EKG 12/14/2021: Sinus tachycardia 114 bpm  Left atrial enlargement  Echocardiogram 11/21/2021:  Left ventricle cavity is normal in size. Moderate concentric hypertrophy  of the left ventricle. Normal global wall motion. Normal LV systolic  function with visual EF 50-55%. Doppler evidence of grade I (impaired)  diastolic dysfunction, normal LAP. No significant valvular abnormality.  Normal right atrial pressure.  Previous study on 08/27/2021 noted the following findings, which  are now  absent: Dilated LV/RV with LVEF 20-25%. LV apical thrombus. Dilated RA/RV.  Mild MR.   MRI brain 09/01/2021: Patchy acute and subacute infarcts as described. Largest area of involvement is in the left frontal lobe corresponding to abnormality on CT where there is some petechial hemorrhage. Involvement of multiple vascular territories suggesting a central source. No intracranial mass.  LHC/RHC 08/28/2021: Normal coronary arteries without coronary artery disease   RA: 10 mmHg RV: 49/4 mmHg PA: 44/24 mmHg, mPAP 34 mmHg PCW: 22 mmHg   CO: 5.7  L/min CI: 2.3 L/min/m2   Decompensated nonischemic cardiomyopathy Resume IV heparin 2 hours after TR band is off given LV apical thrombus GDMT for heart failure and continued workup for breast mass  Echocardiogram 08/27/2021: 1. Left ventricular ejection fraction, by estimation, is 20 to 25%. The  left ventricle has severely decreased function. The left ventricle  demonstrates global hypokinesis. The left ventricular internal cavity size  was dilated. Left ventricular diastolic   parameters are consistent with Grade III diastolic dysfunction  (restrictive). Elevated left ventricular end-diastolic pressure.   2. Right ventricular systolic function is hyperdynamic. The right  ventricular size is mildly enlarged. dilated.   3. Left atrial size was mild to moderately dilated.   4. Right atrial size was dilated.   5. A small pericardial effusion is present. The pericardial effusion is  posterior to the left ventricle.   6. The mitral valve is grossly normal. Mild mitral valve regurgitation.  No evidence of mitral stenosis.   7. The aortic valve is tricuspid. Aortic valve regurgitation is not  visualized. No aortic stenosis is present.   8. There is mild dilatation of the ascending aorta, measuring 40 mm.   9. The inferior vena cava is dilated in size with >50% respiratory  variability, suggesting right atrial pressure of 8 mmHg.   Recent labs: 12/26/2021: Glucose 102, BUN/Cr 9/0.81. EGFR >60. Na/K 139/3.6. Rest of the CMP normal H/H 12/36. MCV 99. Platelets 339  Review of Systems  Eyes:  Positive for vision loss in right eye (Partial).  Cardiovascular:  Negative for chest pain, dyspnea on exertion, leg swelling, palpitations and syncope.         Vitals:   01/11/22 0921  BP: 124/86  Pulse: 95  Resp: 16  Temp: 98.7 F (37.1 C)  SpO2: 97%     Body mass index is 33.5 kg/m. Filed Weights   01/11/22 0921  Weight: 268 lb (121.6 kg)     Objective:   Physical Exam Vitals  and nursing note reviewed.  Constitutional:      General: He is not in acute distress. Neck:     Vascular: No JVD.  Cardiovascular:     Rate and Rhythm: Normal rate and regular rhythm.     Heart sounds: Normal heart sounds. No murmur heard. Pulmonary:     Effort: Pulmonary effort is normal.     Breath sounds: Normal breath sounds. No wheezing or rales.  Musculoskeletal:     Right lower leg: No edema.     Left lower leg: No edema.            Visit diagnoses:   ICD-10-CM   1. Chronic systolic heart failure (HCC)  I50.22 Digoxin level    2. History of stroke  Z86.73        Orders Placed This Encounter  Procedures   Digoxin level    Meds ordered this encounter  Medications   metoprolol succinate (TOPROL-XL) 50 MG  24 hr tablet    Sig: Take 1 tablet (50 mg total) by mouth daily.    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 H/o viral illness in December 2022. Clinically improving, now NYHA class I-II. Currently on Entresto 49-51 mg bid, eplerenone 50 mg daily, metoprolol succinate 25 mg daily, Farxiga 10 mg daily, digoxin 0.125 mg daily. Not on Corlanor 5 mg bid. Increase metoprolol succinate to 50 mg daily. EF increased to 50-55% (11/2021).  Will check digoxin level.  With ongoing chemotherapy including cyclophosphamide, one needs to be careful about monitoring his LVEF.   We will repeat echocardiogram after next visit in 04/2022.  I anticipate that if he continues to do well from heart failure standpoint, we could consider down titrating his heart failure medications sometime in 2024, after he has completed  chemotherapy, mastectomy, and radiation.  Perioperative cardiac risk: He is very well compensated from cardiomyopathy standpoint and is well optimized.  Overall, low perioperative cardiac risk.  Cardioembolic stroke: Multifocal strokes secondary to LV apical thrombus, with mild petechial hemorrhage (08/2021), while on warfarin, thus on eliquis since then.  Patient completed 3 months of anticoagulation post stroke that occurred in the setting of systolic heart failure and LV thrombus. LVEF has now recovered. Anticoagulation discontinued (12/2021).  Breast cancer: Continue management as per Dr. Lindi Adie Plan for mastectomy in 02/2022  F/u in 3 months     Nigel Mormon, MD Pager: [phone] Office: [phone]

## 2022-01-12 ENCOUNTER — Encounter: Payer: Self-pay | Admitting: Cardiology

## 2022-01-15 MED FILL — Dexamethasone Sodium Phosphate Inj 100 MG/10ML: INTRAMUSCULAR | Qty: 1 | Status: AC

## 2022-01-15 MED FILL — Fosaprepitant Dimeglumine For IV Infusion 150 MG (Base Eq): INTRAVENOUS | Qty: 5 | Status: AC

## 2022-01-16 ENCOUNTER — Inpatient Hospital Stay: Payer: BC Managed Care – PPO

## 2022-01-16 ENCOUNTER — Inpatient Hospital Stay (HOSPITAL_BASED_OUTPATIENT_CLINIC_OR_DEPARTMENT_OTHER): Payer: BC Managed Care – PPO | Admitting: Hematology and Oncology

## 2022-01-16 ENCOUNTER — Encounter: Payer: Self-pay | Admitting: *Deleted

## 2022-01-16 ENCOUNTER — Other Ambulatory Visit: Payer: Self-pay

## 2022-01-16 ENCOUNTER — Inpatient Hospital Stay: Payer: BC Managed Care – PPO | Attending: Hematology and Oncology

## 2022-01-16 DIAGNOSIS — C50422 Malignant neoplasm of upper-outer quadrant of left male breast: Secondary | ICD-10-CM | POA: Diagnosis not present

## 2022-01-16 DIAGNOSIS — C773 Secondary and unspecified malignant neoplasm of axilla and upper limb lymph nodes: Secondary | ICD-10-CM | POA: Diagnosis not present

## 2022-01-16 DIAGNOSIS — Z5111 Encounter for antineoplastic chemotherapy: Secondary | ICD-10-CM | POA: Insufficient documentation

## 2022-01-16 DIAGNOSIS — Z95828 Presence of other vascular implants and grafts: Secondary | ICD-10-CM

## 2022-01-16 DIAGNOSIS — Z170 Estrogen receptor positive status [ER+]: Secondary | ICD-10-CM | POA: Insufficient documentation

## 2022-01-16 DIAGNOSIS — Z5189 Encounter for other specified aftercare: Secondary | ICD-10-CM | POA: Insufficient documentation

## 2022-01-16 LAB — CMP (CANCER CENTER ONLY)
ALT: 20 U/L (ref 0–44)
AST: 21 U/L (ref 15–41)
Albumin: 4.3 g/dL (ref 3.5–5.0)
Alkaline Phosphatase: 56 U/L (ref 38–126)
Anion gap: 7 (ref 5–15)
BUN: 11 mg/dL (ref 6–20)
CO2: 25 mmol/L (ref 22–32)
Calcium: 9.3 mg/dL (ref 8.9–10.3)
Chloride: 107 mmol/L (ref 98–111)
Creatinine: 0.81 mg/dL (ref 0.61–1.24)
GFR, Estimated: >60 mL/min (ref >60)
Glucose, Bld: 96 mg/dL (ref 70–99)
Potassium: 3.8 mmol/L (ref 3.5–5.1)
Sodium: 139 mmol/L (ref 135–145)
Total Bilirubin: 0.4 mg/dL (ref 0.3–1.2)
Total Protein: 7.1 g/dL (ref 6.5–8.1)

## 2022-01-16 LAB — CBC WITH DIFFERENTIAL (CANCER CENTER ONLY)
Abs Immature Granulocytes: 0.03 10*3/uL (ref 0.00–0.07)
Basophils Absolute: 0.1 10*3/uL (ref 0.0–0.1)
Basophils Relative: 1 %
Eosinophils Absolute: 0 10*3/uL (ref 0.0–0.5)
Eosinophils Relative: 0 %
HCT: 38.4 % — ABNORMAL LOW (ref 39.0–52.0)
Hemoglobin: 12.9 g/dL — ABNORMAL LOW (ref 13.0–17.0)
Immature Granulocytes: 0 %
Lymphocytes Relative: 27 %
Lymphs Abs: 2.5 10*3/uL (ref 0.7–4.0)
MCH: 33.1 pg (ref 26.0–34.0)
MCHC: 33.6 g/dL (ref 30.0–36.0)
MCV: 98.5 fL (ref 80.0–100.0)
Monocytes Absolute: 1.4 10*3/uL — ABNORMAL HIGH (ref 0.1–1.0)
Monocytes Relative: 16 %
Neutro Abs: 5 10*3/uL (ref 1.7–7.7)
Neutrophils Relative %: 56 %
Platelet Count: 315 10*3/uL (ref 150–400)
RBC: 3.9 MIL/uL — ABNORMAL LOW (ref 4.22–5.81)
RDW: 15.6 % — ABNORMAL HIGH (ref 11.5–15.5)
WBC Count: 9 10*3/uL (ref 4.0–10.5)
nRBC: 0 % (ref 0.0–0.2)

## 2022-01-16 MED ORDER — PALONOSETRON HCL INJECTION 0.25 MG/5ML
0.2500 mg | Freq: Once | INTRAVENOUS | Status: AC
  Administered 2022-01-16: 0.25 mg via INTRAVENOUS
  Filled 2022-01-16: qty 5

## 2022-01-16 MED ORDER — SODIUM CHLORIDE 0.9% FLUSH
10.0000 mL | INTRAVENOUS | Status: DC | PRN
  Administered 2022-01-16: 10 mL

## 2022-01-16 MED ORDER — SODIUM CHLORIDE 0.9% FLUSH
10.0000 mL | Freq: Once | INTRAVENOUS | Status: AC
  Administered 2022-01-16: 10 mL

## 2022-01-16 MED ORDER — HEPARIN SOD (PORK) LOCK FLUSH 100 UNIT/ML IV SOLN
500.0000 [IU] | Freq: Once | INTRAVENOUS | Status: AC | PRN
  Administered 2022-01-16: 500 [IU]

## 2022-01-16 MED ORDER — SODIUM CHLORIDE 0.9 % IV SOLN
150.0000 mg | Freq: Once | INTRAVENOUS | Status: AC
  Administered 2022-01-16: 150 mg via INTRAVENOUS
  Filled 2022-01-16: qty 150

## 2022-01-16 MED ORDER — SODIUM CHLORIDE 0.9 % IV SOLN: Freq: Once | INTRAVENOUS | Status: AC

## 2022-01-16 MED ORDER — SODIUM CHLORIDE 0.9 % IV SOLN
10.0000 mg | Freq: Once | INTRAVENOUS | Status: AC
  Administered 2022-01-16: 10 mg via INTRAVENOUS
  Filled 2022-01-16: qty 10

## 2022-01-16 MED ORDER — SODIUM CHLORIDE 0.9 % IV SOLN
600.0000 mg/m2 | Freq: Once | INTRAVENOUS | Status: AC
  Administered 2022-01-16: 1480 mg via INTRAVENOUS
  Filled 2022-01-16: qty 74

## 2022-01-16 MED ORDER — SODIUM CHLORIDE 0.9 % IV SOLN
75.0000 mg/m2 | Freq: Once | INTRAVENOUS | Status: AC
  Administered 2022-01-16: 190 mg via INTRAVENOUS
  Filled 2022-01-16: qty 19

## 2022-01-16 NOTE — Assessment & Plan Note (Signed)
09/15/2021:Palpable left breast mass x8 months. 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 Neoadjuvant chemotherapy withTaxotere and Cytoxan every 3 weeks x6 cycles 2. Followed by breast conserving surgery with targeted axillary dissectionvs ALND 3. Followed by adjuvant radiation therapy 4.Followed by adjuvant antiestrogen therapy with CDK 4 and 6 inhibitor CT CAP and bone scan: No distant metastatic disease ------------------------------------------------------------------------------------------------------------------------------------------ Current treatment: Cycle6Taxotere Cytoxan Chemo toxicities: 1.Diffuse body pain due to Neulasta:  2.sensitive to the tips of the fingers for couple of days after each chemo 3.Hair loss as expected 4.Nail changes 5.  Worsening fatigue  This concludes his chemotherapy. His surgery has been set up in August. Return to clinic after surgery to discuss the final pathology report

## 2022-01-16 NOTE — Progress Notes (Signed)
Patient Care Team: Pcp, No as PCP - General Mauro Kaufmann, RN as Oncology Nurse Navigator Rockwell Germany, RN as Oncology Nurse Navigator Elease Hashimoto (Neurology)  DIAGNOSIS:  Encounter Diagnosis  Name Primary?   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 CT 08/27/2021 in ED showed 3.8 centimeter left breast mass with left axillary lymph nodes.Mamm and [place]: 4 cm mass with 5 axillary lymph nodes: Biopsy: Grade 2 IDC, lymph node: Positive, ER 100%, PR 50%, Ki-67 25%, HER2 negative ratio 1.25, copy #3.45   09/25/2021 Cancer Staging   Staging form: Breast, AJCC 8th Edition - Clinical: Stage IIA (cT2, cN1, cM0, G2, ER+, PR+, HER2-) - Signed by Nicholas Lose, MD on 09/25/2021 Histologic grading system: 3 grade system   10/03/2021 -  Chemotherapy   Patient is on Treatment Plan : BREAST TC q21d       CHIEF COMPLIANT: cycle 6 TC  INTERVAL HISTORY: Michael Stuart  is a 41 y.o male with the above mention CYCLE 6 TC. States that he had some fatigue. Appetite is good. Denies nausea. Toes has went numb and finger tips are tender. Overall he tolerated last treatment. Tips of his fingers are very tender.  Denies any progression of neuropathy.  He does feel tired more often.   ALLERGIES:  is allergic to bactrim [sulfamethoxazole-trimethoprim].  MEDICATIONS:  Current Outpatient Medications  Medication Sig Dispense Refill   acetaminophen (TYLENOL) 500 MG tablet Take 1,000 mg by mouth every 6 (six) hours as needed for moderate pain.     atorvastatin (LIPITOR) 20 MG tablet Take 1 tablet (20 mg total) by mouth daily. 30 tablet 3   dapagliflozin propanediol (FARXIGA) 10 MG TABS tablet Take 1 tablet by mouth daily.  90 tablet 2   dexamethasone (DECADRON) 4 MG tablet Take 1 tablet (4 mg total) by mouth daily. Take 1 tablet day before chemo and 1 tablet day after chemo with food 12 tablet 0   digoxin (LANOXIN) 0.125 MG tablet Take 1 tablet by mouth daily. 30 tablet 2   eplerenone (INSPRA) 50 MG tablet Take 1 tablet by mouth daily. 90 tablet 3   lidocaine-prilocaine (EMLA) cream Apply to affected area once daily as directed 30 g 2   LORazepam (ATIVAN) 0.5 MG tablet Take 1 tablet (0.5 mg total) by mouth every 6 (six) hours as needed (Nausea or vomiting). 30 tablet 0   magic mouthwash w/lidocaine SOLN Take 5 mLs by mouth 3 (three) times daily as needed for mouth pain. 120 mL 0   metoprolol succinate (TOPROL-XL) 50 MG 24 hr tablet Take 1 tablet (50 mg total) by mouth daily. 90 tablet 3   ondansetron (ZOFRAN) 8 MG tablet Take 1 tablet (8 mg total) by mouth 2 (two) times daily as needed for refractory nausea / vomiting. Start on day 3 after chemo. 30 tablet 0   prochlorperazine (COMPAZINE) 10 MG tablet Take 1 tablet (10 mg total) by mouth every 6 (six) hours as needed (Nausea or vomiting). 30 tablet 0   sacubitril-valsartan (ENTRESTO) 49-51 MG Take 1 tablet by mouth 2 times daily. 60 tablet 3   No current facility-administered medications for this visit.    PHYSICAL EXAMINATION: ECOG PERFORMANCE STATUS: 1 - Symptomatic but  completely ambulatory  Vitals:   01/16/22 1143  BP: (!) 167/91  Pulse: 99  Resp: 18  Temp: (!) 97.5 F (36.4 C)  SpO2: 100%   Filed Weights   01/16/22 1143  Weight: 269 lb 12.8 oz (122.4 kg)     LABORATORY DATA:  I have reviewed the data as listed    Latest Ref Rng & Units 12/26/2021    9:52 AM 12/05/2021    9:59 AM 11/13/2021    9:58 AM  CMP  Glucose 70 - 99 mg/dL 102  148  140   BUN 6 - 20 mg/dL _0 Creatinine 0.61 - 1.24 mg/dL 0.81  0.67  0.77   Sodium 135 - 145 mmol/L 139  137  137   Potassium 3.5 - 5.1 mmol/L 3.6  4.2  4.4   Chloride 98 - 111 mmol/L 106  106  107    CO2 22 - 32 mmol/L _1 Calcium 8.9 - 10.3 mg/dL 9.3  9.3  9.2   Total Protein 6.5 - 8.1 g/dL 6.8  7.0  7.1   Total Bilirubin 0.3 - 1.2 mg/dL 0.3  0.4  0.5   Alkaline Phos 38 - 126 U/L 57  55  64   AST 15 - 41 U/L _2 ALT 0 - 44 U/L _3 Lab Results  Component Value Date   WBC 9.0 01/16/2022   HGB 12.9 (L) 01/16/2022   HCT 38.4 (L) 01/16/2022   MCV 98.5 01/16/2022   PLT 315 01/16/2022   NEUTROABS 5.0 01/16/2022    ASSESSMENT & PLAN:  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 CT 08/27/2021 in ED showed 3.8 cm left breast mass with left axillary lymph nodes.Mamm and [place]: 4 cm mass with 5 axillary lymph nodes: Biopsy: Grade 2 IDC, lymph node: Positive, ER 100%, PR 50%, Ki-67 25%, HER2 negative ratio 1.25, copy #3.45 (Recent history of coronary artery disease EF 22% and stroke)   Treatment plan: 1. Neoadjuvant chemotherapy with Taxotere and Cytoxan every 3 weeks x6 cycles 2. Followed by breast conserving surgery with targeted axillary dissection vs ALND 3. Followed by adjuvant radiation therapy 4.  Followed by adjuvant antiestrogen therapy with CDK 4 and 6 inhibitor CT CAP and bone scan: No distant metastatic disease ------------------------------------------------------------------------------------------------------------------------------------------ Current treatment: Cycle 6 Taxotere Cytoxan Chemo toxicities: 1.  Diffuse body pain due to Neulasta:  2. sensitive to the tips of the fingers for couple of days after each chemo 3.  Hair loss as expected 4.  Nail changes 5.  Worsening fatigue   This concludes his chemotherapy. His surgery has been set up in August. Return to clinic after surgery to discuss the final pathology report    No orders of the defined types were placed in this encounter.  The patient has a good understanding of the  overall plan. he agrees with it. he will call with any problems that may develop before the next visit here. Total time spent: 30 mins including face to face time and time spent for planning, charting and co-ordination of care   Harriette Ohara, MD 01/16/22    I Gardiner Coins am scribing for Dr. Lindi Adie  I have reviewed the above documentation for accuracy and completeness, and I agree with the above.

## 2022-01-16 NOTE — Patient Instructions (Signed)
[facility]  Discharge Instructions: Thank you for choosing [facility] to provide your oncology and hematology care.   If you have a lab appointment with [facility], please go directly to the [facility] and check in at the registration area.   Wear comfortable clothing and clothing appropriate for easy access to any Portacath or PICC line.   We strive to give you quality time with your provider. You may need to reschedule your appointment if you arrive late (15 or more minutes).  Arriving late affects you and other patients whose appointments are after yours.  Also, if you miss three or more appointments without notifying the office, you may be dismissed from the clinic at the provider's discretion.      For prescription refill requests, have your pharmacy contact our office and allow 72 hours for refills to be completed.    Today you received the following chemotherapy and/or immunotherapy agents: Docetaxel (Taxotere) and Cychlophosphamide (Cytoxan).   To help prevent nausea and vomiting after your treatment, we encourage you to take your nausea medication as directed.  BELOW ARE SYMPTOMS THAT SHOULD BE REPORTED IMMEDIATELY: *FEVER GREATER THAN 100.4 F (38 C) OR HIGHER *CHILLS OR SWEATING *NAUSEA AND VOMITING THAT IS NOT CONTROLLED WITH YOUR NAUSEA MEDICATION *UNUSUAL SHORTNESS OF BREATH *UNUSUAL BRUISING OR BLEEDING *URINARY PROBLEMS (pain or burning when urinating, or frequent urination) *BOWEL PROBLEMS (unusual diarrhea, constipation, pain near the anus) TENDERNESS IN MOUTH AND THROAT WITH OR WITHOUT PRESENCE OF ULCERS (sore throat, sores in mouth, or a toothache) UNUSUAL RASH, SWELLING OR PAIN  UNUSUAL VAGINAL DISCHARGE OR ITCHING   Items with * indicate a potential emergency and should be followed up as soon as possible or go to the Emergency Department if any problems should occur.  Please show the CHEMOTHERAPY ALERT CARD or  IMMUNOTHERAPY ALERT CARD at check-in to the Emergency Department and triage nurse.  Should you have questions after your visit or need to cancel or reschedule your appointment, please contact [facility]  Dept: [phone]  and follow the prompts.  Office hours are 8:00 a.m. to 4:30 p.m. Monday - Friday. Please note that voicemails left after 4:00 p.m. may not be returned until the following business day.  We are closed weekends and major holidays. You have access to a nurse at all times for urgent questions. Please call the main number to the clinic Dept: [phone] and follow the prompts.   For any non-urgent questions, you may also contact your provider using MyChart. We now offer e-Visits for anyone 75 and older to request care online for non-urgent symptoms. For details visit [email or web address].   Also download the MyChart app! Go to the app store, search "MyChart", open the app, select [facility], and log in with your MyChart username and password.  Masks are optional in the cancer centers. If you would like for your care team to wear a mask while they are taking care of you, please let them know. For doctor visits, patients may have with them one support person who is at least 43 years old. At this time, visitors are not allowed in the infusion area. Docetaxel injection What is this medication? DOCETAXEL (doe se TAX el) is a chemotherapy drug. It targets fast dividing cells, like cancer cells, and causes these cells to die. This medicine is used to treat many types of cancers like breast cancer, certain stomach cancers, head and neck cancer, lung  cancer, and prostate cancer. This medicine may be used for other purposes; ask your health care provider or pharmacist if you have questions. COMMON BRAND NAME(S): Docefrez, Taxotere What should I tell my care team before I take this medication? They need to know if you have any of these conditions: infection  (especially a virus infection such as chickenpox, cold sores, or herpes) liver disease low blood counts, like low white cell, platelet, or red cell counts an unusual or allergic reaction to docetaxel, polysorbate 80, other chemotherapy agents, other medicines, foods, dyes, or preservatives pregnant or trying to get pregnant breast-feeding How should I use this medication? This drug is given as an infusion into a vein. It is administered in a hospital or clinic by a specially trained health care professional. Talk to your pediatrician regarding the use of this medicine in children. Special care may be needed. Overdosage: If you think you have taken too much of this medicine contact a poison control center or emergency room at once. NOTE: This medicine is only for you. Do not share this medicine with others. What if I miss a dose? It is important not to miss your dose. Call your doctor or health care professional if you are unable to keep an appointment. What may interact with this medication? Do not take this medicine with any of the following medications: live virus vaccines This medicine may also interact with the following medications: aprepitant certain antibiotics like erythromycin or clarithromycin certain antivirals for HIV or hepatitis certain medicines for fungal infections like fluconazole, itraconazole, ketoconazole, posaconazole, or voriconazole cimetidine ciprofloxacin conivaptan cyclosporine dronedarone fluvoxamine grapefruit juice imatinib verapamil This list may not describe all possible interactions. Give your health care provider a list of all the medicines, herbs, non-prescription drugs, or dietary supplements you use. Also tell them if you smoke, drink alcohol, or use illegal drugs. Some items may interact with your medicine. What should I watch for while using this medication? Your condition will be monitored carefully while you are receiving this medicine. You  will need important blood work done while you are taking this medicine. Call your doctor or health care professional for advice if you get a fever, chills or sore throat, or other symptoms of a cold or flu. Do not treat yourself. This drug decreases your body's ability to fight infections. Try to avoid being around people who are sick. Some products may contain alcohol. Ask your health care professional if this medicine contains alcohol. Be sure to tell all health care professionals you are taking this medicine. Certain medicines, like metronidazole and disulfiram, can cause an unpleasant reaction when taken with alcohol. The reaction includes flushing, headache, nausea, vomiting, sweating, and increased thirst. The reaction can last from 30 minutes to several hours. You may get drowsy or dizzy. Do not drive, use machinery, or do anything that needs mental alertness until you know how this medicine affects you. Do not stand or sit up quickly, especially if you are an older patient. This reduces the risk of dizzy or fainting spells. Alcohol may interfere with the effect of this medicine. Talk to your health care professional about your risk of cancer. You may be more at risk for certain types of cancer if you take this medicine. Do not become pregnant while taking this medicine or for 6 months after stopping it. Women should inform their doctor if they wish to become pregnant or think they might be pregnant. There is a potential for serious side effects to an  unborn child. Talk to your health care professional or pharmacist for more information. Do not breast-feed an infant while taking this medicine or for 1 week after stopping it. Males who get this medicine must use a condom during sex with females who can get pregnant. If you get a woman pregnant, the baby could have birth defects. The baby could die before they are born. You will need to continue wearing a condom for 3 months after stopping the medicine.  Tell your health care provider right away if your partner becomes pregnant while you are taking this medicine. This may interfere with the ability to father a child. You should talk to your doctor or health care professional if you are concerned about your fertility. What side effects may I notice from receiving this medication? Side effects that you should report to your doctor or health care professional as soon as possible: allergic reactions like skin rash, itching or hives, swelling of the face, lips, or tongue blurred vision breathing problems changes in vision low blood counts - This drug may decrease the number of white blood cells, red blood cells and platelets. You may be at increased risk for infections and bleeding. nausea and vomiting pain, redness or irritation at site where injected pain, tingling, numbness in the hands or feet redness, blistering, peeling, or loosening of the skin, including inside the mouth signs of decreased platelets or bleeding - bruising, pinpoint red spots on the skin, black, tarry stools, nosebleeds signs of decreased red blood cells - unusually weak or tired, fainting spells, lightheadedness signs of infection - fever or chills, cough, sore throat, pain or difficulty passing urine swelling of the ankle, feet, hands Side effects that usually do not require medical attention (report to your doctor or health care professional if they continue or are bothersome): constipation diarrhea fingernail or toenail changes hair loss loss of appetite mouth sores muscle pain This list may not describe all possible side effects. Call your doctor for medical advice about side effects. You may report side effects to FDA at 1-800-FDA-1088. Where should I keep my medication? This drug is given in a hospital or clinic and will not be stored at home. NOTE: This sheet is a summary. It may not cover all possible information. If you have questions about this medicine, talk  to your doctor, pharmacist, or health care provider.  2023 Elsevier/Gold Standard (2021-05-26 00:00:00)  Cyclophosphamide Injection What is this medication? CYCLOPHOSPHAMIDE (sye kloe FOSS fa mide) is a chemotherapy drug. It slows the growth of cancer cells. This medicine is used to treat many types of cancer like lymphoma, myeloma, leukemia, breast cancer, and ovarian cancer, to name a few. This medicine may be used for other purposes; ask your health care provider or pharmacist if you have questions. COMMON BRAND NAME(S): Cyclophosphamide, Cytoxan, Neosar What should I tell my care team before I take this medication? They need to know if you have any of these conditions: heart disease history of irregular heartbeat infection kidney disease liver disease low blood counts, like white cells, platelets, or red blood cells on hemodialysis recent or ongoing radiation therapy scarring or thickening of the lungs trouble passing urine an unusual or allergic reaction to cyclophosphamide, other medicines, foods, dyes, or preservatives pregnant or trying to get pregnant breast-feeding How should I use this medication? This drug is usually given as an injection into a vein or muscle or by infusion into a vein. It is administered in a hospital or clinic by a  specially trained health care professional. Talk to your pediatrician regarding the use of this medicine in children. Special care may be needed. Overdosage: If you think you have taken too much of this medicine contact a poison control center or emergency room at once. NOTE: This medicine is only for you. Do not share this medicine with others. What if I miss a dose? It is important not to miss your dose. Call your doctor or health care professional if you are unable to keep an appointment. What may interact with this medication? amphotericin B azathioprine certain antivirals for HIV or hepatitis certain medicines for blood pressure,  heart disease, irregular heart beat certain medicines that treat or prevent blood clots like warfarin certain other medicines for cancer cyclosporine etanercept indomethacin medicines that relax muscles for surgery medicines to increase blood counts metronidazole This list may not describe all possible interactions. Give your health care provider a list of all the medicines, herbs, non-prescription drugs, or dietary supplements you use. Also tell them if you smoke, drink alcohol, or use illegal drugs. Some items may interact with your medicine. What should I watch for while using this medication? Your condition will be monitored carefully while you are receiving this medicine. You may need blood work done while you are taking this medicine. Drink water or other fluids as directed. Urinate often, even at night. Some products may contain alcohol. Ask your health care professional if this medicine contains alcohol. Be sure to tell all health care professionals you are taking this medicine. Certain medicines, like metronidazole and disulfiram, can cause an unpleasant reaction when taken with alcohol. The reaction includes flushing, headache, nausea, vomiting, sweating, and increased thirst. The reaction can last from 30 minutes to several hours. Do not become pregnant while taking this medicine or for 1 year after stopping it. Women should inform their health care professional if they wish to become pregnant or think they might be pregnant. Men should not father a child while taking this medicine and for 4 months after stopping it. There is potential for serious side effects to an unborn child. Talk to your health care professional for more information. Do not breast-feed an infant while taking this medicine or for 1 week after stopping it. This medicine has caused ovarian failure in some women. This medicine may make it more difficult to get pregnant. Talk to your health care professional if you are  concerned about your fertility. This medicine has caused decreased sperm counts in some men. This may make it more difficult to father a child. Talk to your health care professional if you are concerned about your fertility. Call your health care professional for advice if you get a fever, chills, or sore throat, or other symptoms of a cold or flu. Do not treat yourself. This medicine decreases your body's ability to fight infections. Try to avoid being around people who are sick. Avoid taking medicines that contain aspirin, acetaminophen, ibuprofen, naproxen, or ketoprofen unless instructed by your health care professional. These medicines may hide a fever. Talk to your health care professional about your risk of cancer. You may be more at risk for certain types of cancer if you take this medicine. If you are going to need surgery or other procedure, tell your health care professional that you are using this medicine. Be careful brushing or flossing your teeth or using a toothpick because you may get an infection or bleed more easily. If you have any dental work done, tell your dentist  you are receiving this medicine. What side effects may I notice from receiving this medication? Side effects that you should report to your doctor or health care professional as soon as possible: allergic reactions like skin rash, itching or hives, swelling of the face, lips, or tongue breathing problems nausea, vomiting signs and symptoms of bleeding such as bloody or black, tarry stools; red or dark brown urine; spitting up blood or brown material that looks like coffee grounds; red spots on the skin; unusual bruising or bleeding from the eyes, gums, or nose signs and symptoms of heart failure like fast, irregular heartbeat, sudden weight gain; swelling of the ankles, feet, hands signs and symptoms of infection like fever; chills; cough; sore throat; pain or trouble passing urine signs and symptoms of kidney injury  like trouble passing urine or change in the amount of urine signs and symptoms of liver injury like dark yellow or brown urine; general ill feeling or flu-like symptoms; light-colored stools; loss of appetite; nausea; right upper belly pain; unusually weak or tired; yellowing of the eyes or skin Side effects that usually do not require medical attention (report to your doctor or health care professional if they continue or are bothersome): confusion decreased hearing diarrhea facial flushing hair loss headache loss of appetite missed menstrual periods signs and symptoms of low red blood cells or anemia such as unusually weak or tired; feeling faint or lightheaded; falls skin discoloration This list may not describe all possible side effects. Call your doctor for medical advice about side effects. You may report side effects to FDA at 1-800-FDA-1088. Where should I keep my medication? This drug is given in a hospital or clinic and will not be stored at home. NOTE: This sheet is a summary. It may not cover all possible information. If you have questions about this medicine, talk to your doctor, pharmacist, or health care provider.  2023 Elsevier/Gold Standard (2021-05-26 00:00:00)

## 2022-01-17 ENCOUNTER — Telehealth: Payer: Self-pay | Admitting: Hematology and Oncology

## 2022-01-17 NOTE — Telephone Encounter (Signed)
.  Called patient to schedule appointment per 7/11 inbasket, patient is aware of date and time.   

## 2022-01-18 ENCOUNTER — Other Ambulatory Visit: Payer: Self-pay

## 2022-01-18 ENCOUNTER — Inpatient Hospital Stay: Payer: BC Managed Care – PPO

## 2022-01-18 VITALS — BP 140/80 | HR 88 | Temp 98.7°F | Resp 20

## 2022-01-18 DIAGNOSIS — Z170 Estrogen receptor positive status [ER+]: Secondary | ICD-10-CM | POA: Diagnosis not present

## 2022-01-18 DIAGNOSIS — C773 Secondary and unspecified malignant neoplasm of axilla and upper limb lymph nodes: Secondary | ICD-10-CM | POA: Diagnosis not present

## 2022-01-18 DIAGNOSIS — C50422 Malignant neoplasm of upper-outer quadrant of left male breast: Secondary | ICD-10-CM | POA: Diagnosis not present

## 2022-01-18 DIAGNOSIS — Z5189 Encounter for other specified aftercare: Secondary | ICD-10-CM | POA: Diagnosis not present

## 2022-01-18 DIAGNOSIS — Z5111 Encounter for antineoplastic chemotherapy: Secondary | ICD-10-CM | POA: Diagnosis not present

## 2022-01-18 MED ORDER — PEGFILGRASTIM-CBQV 6 MG/0.6ML ~~LOC~~ SOSY
6.0000 mg | PREFILLED_SYRINGE | Freq: Once | SUBCUTANEOUS | Status: AC
  Administered 2022-01-18: 6 mg via SUBCUTANEOUS
  Filled 2022-01-18: qty 0.6

## 2022-01-22 ENCOUNTER — Telehealth: Payer: Self-pay | Admitting: Genetic Counselor

## 2022-01-22 ENCOUNTER — Encounter: Payer: Self-pay | Admitting: Genetic Counselor

## 2022-01-22 ENCOUNTER — Encounter: Payer: Self-pay | Admitting: *Deleted

## 2022-01-22 DIAGNOSIS — Z1379 Encounter for other screening for genetic and chromosomal anomalies: Secondary | ICD-10-CM | POA: Insufficient documentation

## 2022-01-22 NOTE — Telephone Encounter (Signed)
Revealed negative genetic testing.  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 It could be due to a different gene that we are not testing, or maybe our current technology may not be able to pick something up.  It will be important for him to keep in contact with genetics to keep up with whether additional testing may be needed.

## 2022-01-29 ENCOUNTER — Other Ambulatory Visit: Payer: Self-pay

## 2022-02-06 ENCOUNTER — Other Ambulatory Visit (HOSPITAL_COMMUNITY): Payer: Self-pay

## 2022-02-06 ENCOUNTER — Other Ambulatory Visit: Payer: Self-pay | Admitting: Cardiology

## 2022-02-06 ENCOUNTER — Other Ambulatory Visit: Payer: Self-pay

## 2022-02-12 NOTE — Pre-Procedure Instructions (Signed)
Surgical Instructions    Your procedure is scheduled on Wednesday, August 16.  Report to [facility] Main Entrance "A" at 11:30 A.M., then check in with the Admitting office.  Call this number if you have problems the morning of surgery:  [phone]   If you have any questions prior to your surgery date call [phone]: Open Monday-Friday 8am-4pm    Remember:  Do not eat after midnight the night before your surgery  You may drink clear liquids until 10:30AM the morning of your surgery.   Clear liquids allowed are: Water, Non-Citrus Juices (without pulp), Carbonated Beverages, Clear Tea, Black Coffee ONLY (NO MILK, CREAM OR POWDERED CREAMER of any kind), and Gatorade    Take these medicines the morning of surgery with A SIP OF WATER:  metoprolol succinate (TOPROL-XL)  atorvastatin (LIPITOR) digoxin (LANOXIN)  acetaminophen (TYLENOL) if needed LORazepam (ATIVAN) if needed ondansetron [facility])  if needed prochlorperazine (COMPAZINE) if needed   As of today, STOP taking any Aspirin (unless otherwise instructed by your surgeon) Aleve, Naproxen, Ibuprofen, Motrin, Advil, Goody's, BC's, all herbal medications, fish oil, and all vitamins.    [facility] is not responsible for any belongings or valuables.    Do NOT Smoke (Tobacco/Vaping)  24 hours prior to your procedure  If you use a CPAP at night, you may bring your mask for your overnight stay.   Contacts, glasses, hearing aids, dentures or partials may not be worn into surgery, please bring cases for these belongings   For patients admitted to the hospital, discharge time will be determined by your treatment team.   Patients discharged the day of surgery will not be allowed to drive home, and someone needs to stay with them for 24 hours.   SURGICAL WAITING ROOM VISITATION Patients having surgery or a procedure may have no more than 2 support people in the waiting area - these visitors may rotate.   Children under the age  of 8 must have an adult with them who is not the patient. If the patient needs to stay at the hospital during part of their recovery, the visitor guidelines for inpatient rooms apply. Pre-op nurse will coordinate an appropriate time for 1 support person to accompany patient in pre-op.  This support person may not rotate.   Please refer to the [facility] website for the visitor guidelines for Inpatients (after your surgery is over and you are in a regular room).    Special instructions:    Oral Hygiene is also important to reduce your risk of infection.  Remember - BRUSH YOUR TEETH THE MORNING OF SURGERY WITH YOUR REGULAR TOOTHPASTE   [facility]- Preparing For Surgery  Before surgery, you can play an important role. Because skin is not sterile, your skin needs to be as free of germs as possible. You can reduce the number of germs on your skin by washing with CHG (chlorahexidine gluconate) Soap before surgery.  CHG is an antiseptic cleaner which kills germs and bonds with the skin to continue killing germs even after washing.     Please do not use if you have an allergy to CHG or antibacterial soaps. If your skin becomes reddened/irritated stop using the CHG.  Do not shave (including legs and underarms) for at least 48 hours prior to first CHG shower. It is OK to shave your face.  Please follow these instructions carefully.     Shower the NIGHT BEFORE SURGERY and the MORNING OF SURGERY with CHG Soap.   If  you chose to wash your hair, wash your hair first as usual with your normal shampoo. After you shampoo, rinse your hair and body thoroughly to remove the shampoo.  Then [employer] and genitals (private parts) with your normal soap and rinse thoroughly to remove soap.  After that Use CHG Soap as you would any other liquid soap. You can apply CHG directly to the skin and wash gently with a scrungie or a clean washcloth.   Apply the CHG Soap to your body ONLY FROM THE NECK DOWN.  Do not use  on open wounds or open sores. Avoid contact with your eyes, ears, mouth and genitals (private parts). Wash Face and genitals (private parts)  with your normal soap.   Wash thoroughly, paying special attention to the area where your surgery will be performed.  Thoroughly rinse your body with warm water from the neck down.  DO NOT shower/wash with your normal soap after using and rinsing off the CHG Soap.  Pat yourself dry with a CLEAN TOWEL.  Wear CLEAN PAJAMAS to bed the night before surgery  Place CLEAN SHEETS on your bed the night before your surgery  DO NOT SLEEP WITH PETS.   Day of Surgery:  Take a shower with CHG soap. Wear Clean/Comfortable clothing the morning of surgery   Do not wear jewelry or makeup. Do not wear lotions, powders, perfumes/cologne or deodorant. Do not shave 48 hours prior to surgery.  Men may shave face and neck. Do not bring valuables to the hospital. Do not wear nail polish, gel polish, artificial nails, or any other type of covering on natural nails (fingers and toes) If you have artificial nails or gel coating that need to be removed by a nail salon, please have this removed prior to surgery. Artificial nails or gel coating may interfere with anesthesia's ability to adequately monitor your vital signs. Remember to brush your teeth WITH YOUR REGULAR TOOTHPASTE.    If you received a COVID test during your pre-op visit, it is requested that you wear a mask when out in public, stay away from anyone that may not be feeling well, and notify your surgeon if you develop symptoms. If you have been in contact with anyone that has tested positive in the last 10 days, please notify your surgeon.    Please read over the following fact sheets that you were given.

## 2022-02-13 ENCOUNTER — Other Ambulatory Visit: Payer: Self-pay

## 2022-02-13 ENCOUNTER — Encounter (HOSPITAL_COMMUNITY)
Admission: RE | Admit: 2022-02-13 | Discharge: 2022-02-13 | Disposition: A | Discharge status: Home / Self Care | Payer: BC Managed Care – PPO | Source: Ambulatory Visit | Attending: Surgery | Admitting: Surgery

## 2022-02-13 ENCOUNTER — Encounter (HOSPITAL_COMMUNITY): Payer: Self-pay

## 2022-02-13 VITALS — BP 124/100 | HR 93 | Temp 98.2°F | Resp 18 | Ht 74.0 in | Wt 269.7 lb

## 2022-02-13 DIAGNOSIS — Z01812 Encounter for preprocedural laboratory examination: Secondary | ICD-10-CM | POA: Diagnosis not present

## 2022-02-13 DIAGNOSIS — Z01818 Encounter for other preprocedural examination: Secondary | ICD-10-CM

## 2022-02-13 HISTORY — DX: Acute myocardial infarction, unspecified: I21.9

## 2022-02-13 HISTORY — DX: Pneumonia, unspecified organism: J18.9

## 2022-02-13 LAB — CBC
HCT: 45.6 % (ref 39.0–52.0)
Hemoglobin: 14.9 g/dL (ref 13.0–17.0)
MCH: 32.9 pg (ref 26.0–34.0)
MCHC: 32.7 g/dL (ref 30.0–36.0)
MCV: 100.7 fL — ABNORMAL HIGH (ref 80.0–100.0)
Platelets: 200 10*3/uL (ref 150–400)
RBC: 4.53 MIL/uL (ref 4.22–5.81)
RDW: 15.3 % (ref 11.5–15.5)
WBC: 5.3 10*3/uL (ref 4.0–10.5)
nRBC: 0 % (ref 0.0–0.2)

## 2022-02-13 LAB — BASIC METABOLIC PANEL
Anion gap: 10 (ref 5–15)
BUN: 12 mg/dL (ref 6–20)
CO2: 23 mmol/L (ref 22–32)
Calcium: 9.3 mg/dL (ref 8.9–10.3)
Chloride: 105 mmol/L (ref 98–111)
Creatinine, Ser: 0.82 mg/dL (ref 0.61–1.24)
GFR, Estimated: >60 mL/min (ref >60)
Glucose, Bld: 97 mg/dL (ref 70–99)
Potassium: 4.3 mmol/L (ref 3.5–5.1)
Sodium: 138 mmol/L (ref 135–145)

## 2022-02-13 NOTE — Pre-Procedure Instructions (Signed)
Surgical Instructions    Your procedure is scheduled on Wednesday, August 16.  Report to [facility] Main Entrance "A" at 11:30 A.M., then check in with the Admitting office.  Call this number if you have problems the morning of surgery:  [phone]   If you have any questions prior to your surgery date call [phone]: Open Monday-Friday 8am-4pm    Remember:  Do not eat after midnight the night before your surgery  You may drink clear liquids until 10:30AM the morning of your surgery.   Clear liquids allowed are: Water, Non-Citrus Juices (without pulp), Carbonated Beverages, Clear Tea, Black Coffee ONLY (NO MILK, CREAM OR POWDERED CREAMER of any kind), and Gatorade    Take these medicines the morning of surgery with A SIP OF WATER:  metoprolol succinate (TOPROL-XL)  atorvastatin (LIPITOR) digoxin (LANOXIN)  acetaminophen (TYLENOL) if needed LORazepam (ATIVAN) if needed ondansetron [facility])  if needed prochlorperazine (COMPAZINE) if needed   As of today, STOP taking any Aspirin (unless otherwise instructed by your surgeon) Aleve, Naproxen, Ibuprofen, Motrin, Advil, Goody's, BC's, all herbal medications, fish oil, and all vitamins.    [place] is not responsible for any belongings or valuables.    Do NOT Smoke (Tobacco/Vaping)  24 hours prior to your procedure  If you use a CPAP at night, you may bring your mask for your overnight stay.   Contacts, glasses, hearing aids, dentures or partials may not be worn into surgery, please bring cases for these belongings   For patients admitted to the hospital, discharge time will be determined by your treatment team.   Patients discharged the day of surgery will not be allowed to drive home, and someone needs to stay with them for 24 hours.   SURGICAL WAITING ROOM VISITATION Patients having surgery or a procedure may have no more than 2 support people in the waiting area - these visitors may rotate.   Children under the age  of 51 must have an adult with them who is not the patient. If the patient needs to stay at the hospital during part of their recovery, the visitor guidelines for inpatient rooms apply. Pre-op nurse will coordinate an appropriate time for 1 support person to accompany patient in pre-op.  This support person may not rotate.   Please refer to the [facility] website for the visitor guidelines for Inpatients (after your surgery is over and you are in a regular room).    Special instructions:    Oral Hygiene is also important to reduce your risk of infection.  Remember - BRUSH YOUR TEETH THE MORNING OF SURGERY WITH YOUR REGULAR TOOTHPASTE   [place]- Preparing For Surgery  Before surgery, you can play an important role. Because skin is not sterile, your skin needs to be as free of germs as possible. You can reduce the number of germs on your skin by washing with CHG (chlorahexidine gluconate) Soap before surgery.  CHG is an antiseptic cleaner which kills germs and bonds with the skin to continue killing germs even after washing.     Please do not use if you have an allergy to CHG or antibacterial soaps. If your skin becomes reddened/irritated stop using the CHG.  Do not shave (including legs and underarms) for at least 48 hours prior to first CHG shower. It is OK to shave your face.  Please follow these instructions carefully.     Shower the NIGHT BEFORE SURGERY and the MORNING OF SURGERY with CHG Soap.   If  you chose to wash your hair, wash your hair first as usual with your normal shampoo. After you shampoo, rinse your hair and body thoroughly to remove the shampoo.  Then [employer] and genitals (private parts) with your normal soap and rinse thoroughly to remove soap.  After that Use CHG Soap as you would any other liquid soap. You can apply CHG directly to the skin and wash gently with a scrungie or a clean washcloth.   Apply the CHG Soap to your body ONLY FROM THE NECK DOWN.  Do not use  on open wounds or open sores. Avoid contact with your eyes, ears, mouth and genitals (private parts). Wash Face and genitals (private parts)  with your normal soap.   Wash thoroughly, paying special attention to the area where your surgery will be performed.  Thoroughly rinse your body with warm water from the neck down.  DO NOT shower/wash with your normal soap after using and rinsing off the CHG Soap.  Pat yourself dry with a CLEAN TOWEL.  Wear CLEAN PAJAMAS to bed the night before surgery  Place CLEAN SHEETS on your bed the night before your surgery  DO NOT SLEEP WITH PETS.   Day of Surgery:  Take a shower with CHG soap.  Wear Clean/Comfortable clothing the morning of surgery   Do not wear jewelry  Do not wear lotions, powders, cologne or deodorant. DMen may shave face and neck. Do not bring valuables to the hospital.  Remember to brush your teeth WITH YOUR REGULAR TOOTHPASTE.    If you received a COVID test during your pre-op visit, it is requested that you wear a mask when out in public, stay away from anyone that may not be feeling well, and notify your surgeon if you develop symptoms. If you have been in contact with anyone that has tested positive in the last 10 days, please notify your surgeon.    Please read over the following fact sheets that you were given.

## 2022-02-13 NOTE — Progress Notes (Signed)
PCP - denies Cardiologist - Jarrett Ables, MD Oncologist - Nicholas Lose, MD  PPM/ICD - denies Device Orders - n/a Rep Notified - n/a  Chest x-ray - n/a EKG - 12/14/2021 Stress Test - denies ECHO - 11/21/2021 Cardiac Cath - 08/28/2021  Sleep Study - denies CPAP - n/a  Fasting Blood Sugar - n/a  Blood Thinner Instructions: n/a  Aspirin Instructions: Patient was instructed: As of today, STOP taking any Aspirin (unless otherwise instructed by your surgeon) Aleve, Naproxen, Ibuprofen, Motrin, Advil, Goody's, BC's, all herbal medications, fish oil, and all vitamins.    ERAS Protcol - yes, until 10:30 o'clock  COVID TEST- n/a   Anesthesia review: yes - cardiac history; seed placement; patient finished chemo on July.   Patient denies shortness of breath, fever, cough and chest pain at PAT appointment   All instructions explained to the patient, with a verbal understanding of the material. Patient agrees to go over the instructions while at home for a better understanding. Patient also instructed to self quarantine after being tested for COVID-19. The opportunity to ask questions was provided.

## 2022-02-15 NOTE — Progress Notes (Signed)
Anesthesia Chart Review:  Follows with oncologist Dr. Lindi Adie for history of malignant neoplasm of the left breast.  Currently on neoadjuvant chemotherapy.  Follows with cardiologist Dr. Virgina Jock for history of hospitalization February 2023 for HFrEF with possible myocarditis (viral illness December 2022 and cardiac MRI consistent with acute myocarditis), CVA, and LV apical thrombus.  He completed 3 months of anticoagulation post stroke that occurred in the setting of systolic heart failure and left LV thrombus, LVEF has now recovered and anticoagulation was discontinued June 2023.  Last seen by Dr. Virgina Jock 01/11/2022.  Noted to be clinically improving, EF 50 to 55% by echo May 2023.  Upcoming surgery also discussed.  Per note, "With ongoing chemotherapy including cyclophosphamide, one needs to be careful about monitoring his LVEF.  We will repeat echocardiogram after next visit in 04/2022. I anticipate that if he continues to do well from heart failure standpoint, we could consider down titrating his heart failure medications sometime in 2024, after he has completed chemotherapy, mastectomy, and radiation. Perioperative cardiac risk: He is very well compensated from cardiomyopathy standpoint and is well optimized.  Overall, low perioperative cardiac risk."  Preop labs reviewed, unremarkable.  EKG 12/14/2021: Sinus tachycardia.  Rate 114.  LAE.  Echocardiogram 11/21/2021:  Left ventricle cavity is normal in size. Moderate concentric hypertrophy  of the left ventricle. Normal global wall motion. Normal LV systolic  function with visual EF 50-55%. Doppler evidence of grade I (impaired)  diastolic dysfunction, normal LAP. No significant valvular abnormality.  Normal right atrial pressure.  Previous study on 08/27/2021 noted the following findings, which are now  absent: Dilated LV/RV with LVEF 20-25%. LV apical thrombus. Dilated RA/RV.  Mild MR.    MRI brain 09/01/2021: Patchy acute and subacute  infarcts as described. Largest area of involvement is in the left frontal lobe corresponding to abnormality on CT where there is some petechial hemorrhage. Involvement of multiple vascular territories suggesting a central source. No intracranial mass.   Cardiac MRI 08/29/2021: IMPRESSION: 1.  Left breast mass as seen on prior CT.  Ongoing workup.   2. Severely dilated LV with mild concentric LV hypertrophy. EF 10% with global hypokinesis.   3. 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 The patient is on heparin gtt.   4.  Mildly dilated RV with EF 15%.   5. Delayed enhancement pattern as above. The inferolateral LGE could be a OM territory infarct, but corresponding lesion not seen by cath. Overall, most likely acute myocarditis given subepicardial apical inferior LGE also (the RV insertion site LGE can be nonspecific in the setting of volume/pressure overload). Elevated T2 in the apical inferior wall is more suggestive of acute myocarditis. 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  LHC/RHC 08/28/2021: Normal coronary arteries without coronary artery disease   RA: 10 mmHg RV: 49/4 mmHg PA: 44/24 mmHg, mPAP 34 mmHg PCW: 22 mmHg   CO: 5.7 L/min CI: 2.3 L/min/m2   Decompensated nonischemic cardiomyopathy Resume IV heparin 2 hours after TR band is off given LV apical thrombus GDMT for heart failure and continued workup for breast mass     Wynonia Musty [facility] Stay [facility]/Anesthesiology Phone [phone] 02/15/2022 9:55 AM

## 2022-02-15 NOTE — Anesthesia Preprocedure Evaluation (Addendum)
Anesthesia Evaluation  Patient identified by MRN, date of birth, ID band Patient awake    Reviewed: Allergy & Precautions, NPO status , Patient's Chart, lab work & pertinent test results  Airway Mallampati: II  TM Distance: >3 FB Neck ROM: Full    Dental no notable dental hx.    Pulmonary neg pulmonary ROS, Patient abstained from smoking., former smoker,    Pulmonary exam normal breath sounds clear to auscultation       Cardiovascular + Past MI and +CHF  Normal cardiovascular exam Rhythm:Regular Rate:Normal  Echocardiogram 11/21/2021:  Left ventricle cavity is normal in size. Moderate concentric hypertrophy  of the left ventricle. Normal global wall motion. Normal LV systolic  function with visual EF 50-55%. Doppler evidence of grade I (impaired)  diastolic dysfunction, normal LAP. No significant valvular abnormality.  Normal right atrial pressure.  Previous study on 08/27/2021 noted the following findings, which are now  absent: Dilated LV/RV with LVEF 20-25%. LV apical thrombus. Dilated RA/RV.  Mild MR.    Neuro/Psych CVA negative psych ROS   GI/Hepatic negative GI ROS, (+)     substance abuse  alcohol use,   Endo/Other  negative endocrine ROS  Renal/GU negative Renal ROS  negative genitourinary   Musculoskeletal negative musculoskeletal ROS (+)   Abdominal   Peds negative pediatric ROS (+)  Hematology negative hematology ROS (+)   Anesthesia Other Findings   Reproductive/Obstetrics negative OB ROS                            Anesthesia Physical Anesthesia Plan  ASA: 3  Anesthesia Plan: General   Post-op Pain Management: Regional block*   Induction: Intravenous  PONV Risk Score and Plan: 3 and Ondansetron, Dexamethasone, Droperidol and Treatment may vary due to age or medical condition  Airway Management Planned: LMA  Additional Equipment:   Intra-op Plan:    Post-operative Plan: Extubation in OR  Informed Consent: I have reviewed the patients History and Physical, chart, labs and discussed the procedure including the risks, benefits and alternatives for the proposed anesthesia with the patient or authorized representative who has indicated his/her understanding and acceptance.     Dental advisory given  Plan Discussed with: CRNA and Surgeon  Anesthesia Plan Comments: (PAT note by Karoline Caldwell, PA-C:  Follows with oncologist Dr. Lindi Adie for history of malignant neoplasm of the left breast.  Currently on neoadjuvant chemotherapy.  Follows with cardiologist Dr. Virgina Jock for history of hospitalization February 2023 for HFrEF with possible myocarditis (viral illness December 2022 and cardiac MRI consistent with acute myocarditis), CVA, and LV apical thrombus.  He completed 3 months of anticoagulation post stroke that occurred in the setting of systolic heart failure and left LV thrombus, LVEF has now recovered and anticoagulation was discontinued June 2023.  Last seen by Dr. Virgina Jock 01/11/2022.  Noted to be clinically improving, EF 50 to 55% by echo May 2023.  Upcoming surgery also discussed.  Per note, "With ongoing chemotherapy including cyclophosphamide, one needs to be careful about monitoring his LVEF. We will repeat echocardiogramafter next visit in 04/2022. I anticipate that if he continues to do well from heart failure standpoint, we could consider down titrating his heart failure medications sometime in 2024, after he has completed chemotherapy, mastectomy, and radiation. Perioperative cardiac risk: He is very well compensated from cardiomyopathy standpoint and is well optimized.  Overall, low perioperative cardiac risk."  Preop labs reviewed, unremarkable.  EKG 12/14/2021: Sinus tachycardia.  Rate 114.  LAE.  Echocardiogram 11/21/2021:  Left ventricle cavity is normal in size. Moderate concentric hypertrophy  of the left ventricle. Normal  global wall motion. Normal LV systolic  function with visual EF 50-55%.Doppler evidence of grade I (impaired)  diastolic dysfunction, normal LAP. No significant valvular abnormality.  Normal right atrial pressure.  Previous study on 08/27/2021 noted the following findings, which are now  absent: Dilated LV/RV with LVEF 20-25%. LV apical thrombus. Dilated RA/RV.  Mild MR.   MRI brain 09/01/2021: Patchy acute and subacute infarcts as described. Largest area of involvement is in the left frontal lobe corresponding to abnormality on CT where there is some petechial hemorrhage. Involvement of multiple vascular territories suggesting a central source. No intracranial mass.  Cardiac MRI 08/29/2021: IMPRESSION: 1. Left breast mass as seen on prior CT. Ongoing workup.  2. Severely dilated LV with mild concentric LV hypertrophy. EF 10% with global hypokinesis.  3. 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 The patient is on heparin gtt.  4. Mildly dilated RV with EF 15%.  5. Delayed enhancement pattern as above. The inferolateral LGE could be a OM territory infarct, but corresponding lesion not seen by cath. Overall, most likely acute myocarditis given subepicardial apical inferior LGE also (the RV insertion site LGE can be nonspecific in the setting of volume/pressure overload). Elevated T2 in the apical inferior wall is more suggestive of acute myocarditis. 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  LHC/RHC 08/28/2021: Normal coronary arteries without coronary artery disease  RA: 10 mmHg RV: 49/4 mmHg PA: 44/24 mmHg, mPAP 34 mmHg PCW: 22 mmHg  CO: 5.7 L/min CI: 2.3 L/min/m2  Decompensated nonischemic cardiomyopathy Resume IV heparin 2 hours after TR band is off given LV apical thrombus GDMT for heart failure and continued workup for breast mass   )       Anesthesia  Quick Evaluation

## 2022-02-20 ENCOUNTER — Ambulatory Visit
Admission: RE | Admit: 2022-02-20 | Discharge: 2022-02-20 | Disposition: A | Discharge status: Home / Self Care | Payer: BC Managed Care – PPO | Source: Ambulatory Visit | Attending: Surgery | Admitting: Surgery

## 2022-02-20 ENCOUNTER — Other Ambulatory Visit: Payer: Self-pay | Admitting: Surgery

## 2022-02-20 DIAGNOSIS — C50912 Malignant neoplasm of unspecified site of left female breast: Secondary | ICD-10-CM

## 2022-02-20 DIAGNOSIS — C773 Secondary and unspecified malignant neoplasm of axilla and upper limb lymph nodes: Secondary | ICD-10-CM | POA: Diagnosis not present

## 2022-02-21 ENCOUNTER — Observation Stay (HOSPITAL_COMMUNITY)
Admission: RE | Admit: 2022-02-21 | Discharge: 2022-02-22 | Disposition: A | Discharge status: Home / Self Care | Payer: BC Managed Care – PPO | Attending: General Surgery | Admitting: General Surgery

## 2022-02-21 ENCOUNTER — Encounter (HOSPITAL_COMMUNITY): Admission: RE | Disposition: A | Discharge status: Home / Self Care | Payer: Self-pay | Source: Home / Self Care

## 2022-02-21 ENCOUNTER — Ambulatory Visit (HOSPITAL_COMMUNITY): Payer: BC Managed Care – PPO | Admitting: Anesthesiology

## 2022-02-21 ENCOUNTER — Ambulatory Visit (HOSPITAL_COMMUNITY): Payer: BC Managed Care – PPO | Admitting: Physician Assistant

## 2022-02-21 ENCOUNTER — Ambulatory Visit
Admission: RE | Admit: 2022-02-21 | Discharge: 2022-02-21 | Disposition: A | Discharge status: Home / Self Care | Payer: BC Managed Care – PPO | Source: Ambulatory Visit | Attending: Surgery | Admitting: Surgery

## 2022-02-21 ENCOUNTER — Other Ambulatory Visit: Payer: Self-pay

## 2022-02-21 DIAGNOSIS — R590 Localized enlarged lymph nodes: Secondary | ICD-10-CM | POA: Diagnosis not present

## 2022-02-21 DIAGNOSIS — Z7901 Long term (current) use of anticoagulants: Secondary | ICD-10-CM | POA: Insufficient documentation

## 2022-02-21 DIAGNOSIS — F17210 Nicotine dependence, cigarettes, uncomplicated: Secondary | ICD-10-CM | POA: Diagnosis not present

## 2022-02-21 DIAGNOSIS — Z79899 Other long term (current) drug therapy: Secondary | ICD-10-CM | POA: Diagnosis not present

## 2022-02-21 DIAGNOSIS — I509 Heart failure, unspecified: Secondary | ICD-10-CM | POA: Diagnosis not present

## 2022-02-21 DIAGNOSIS — Z452 Encounter for adjustment and management of vascular access device: Secondary | ICD-10-CM | POA: Diagnosis not present

## 2022-02-21 DIAGNOSIS — C50822 Malignant neoplasm of overlapping sites of left male breast: Principal | ICD-10-CM | POA: Insufficient documentation

## 2022-02-21 DIAGNOSIS — C773 Secondary and unspecified malignant neoplasm of axilla and upper limb lymph nodes: Secondary | ICD-10-CM | POA: Diagnosis not present

## 2022-02-21 DIAGNOSIS — C50912 Malignant neoplasm of unspecified site of left female breast: Secondary | ICD-10-CM | POA: Diagnosis present

## 2022-02-21 DIAGNOSIS — Z86718 Personal history of other venous thrombosis and embolism: Secondary | ICD-10-CM | POA: Insufficient documentation

## 2022-02-21 DIAGNOSIS — C50922 Malignant neoplasm of unspecified site of left male breast: Secondary | ICD-10-CM | POA: Diagnosis not present

## 2022-02-21 DIAGNOSIS — Z170 Estrogen receptor positive status [ER+]: Secondary | ICD-10-CM | POA: Diagnosis not present

## 2022-02-21 DIAGNOSIS — G8918 Other acute postprocedural pain: Secondary | ICD-10-CM | POA: Diagnosis not present

## 2022-02-21 DIAGNOSIS — I252 Old myocardial infarction: Secondary | ICD-10-CM | POA: Diagnosis not present

## 2022-02-21 DIAGNOSIS — R920 Mammographic microcalcification found on diagnostic imaging of breast: Secondary | ICD-10-CM | POA: Diagnosis not present

## 2022-02-21 DIAGNOSIS — Z01818 Encounter for other preprocedural examination: Secondary | ICD-10-CM

## 2022-02-21 DIAGNOSIS — Z87891 Personal history of nicotine dependence: Secondary | ICD-10-CM | POA: Diagnosis not present

## 2022-02-21 HISTORY — PX: PORT-A-CATH REMOVAL: SHX5289

## 2022-02-21 HISTORY — PX: MASTECTOMY W/ SENTINEL NODE BIOPSY: SHX2001

## 2022-02-21 SURGERY — MASTECTOMY WITH SENTINEL LYMPH NODE BIOPSY
Anesthesia: General | Site: Chest | Laterality: Right

## 2022-02-21 MED ORDER — DEXMEDETOMIDINE HCL IN NACL 80 MCG/20ML IV SOLN
INTRAVENOUS | Status: AC
  Filled 2022-02-21: qty 20

## 2022-02-21 MED ORDER — ZOLPIDEM TARTRATE 5 MG PO TABS
5.0000 mg | ORAL_TABLET | Freq: Every evening | ORAL | Status: DC | PRN
  Administered 2022-02-21: 5 mg via ORAL
  Filled 2022-02-21: qty 1

## 2022-02-21 MED ORDER — ROPIVACAINE HCL 5 MG/ML IJ SOLN
INTRAMUSCULAR | Status: DC | PRN
  Administered 2022-02-21: 30 mL

## 2022-02-21 MED ORDER — ENOXAPARIN SODIUM 40 MG/0.4ML IJ SOSY
40.0000 mg | PREFILLED_SYRINGE | INTRAMUSCULAR | Status: DC
  Administered 2022-02-22: 40 mg via SUBCUTANEOUS
  Filled 2022-02-21: qty 0.4

## 2022-02-21 MED ORDER — SODIUM CHLORIDE (PF) 0.9 % IJ SOLN
INTRAMUSCULAR | Status: AC
Start: 2022-02-21 — End: ?
  Filled 2022-02-21: qty 10

## 2022-02-21 MED ORDER — HYDROMORPHONE HCL 1 MG/ML IJ SOLN
INTRAMUSCULAR | Status: AC
  Filled 2022-02-21: qty 1

## 2022-02-21 MED ORDER — BUPIVACAINE-EPINEPHRINE 0.25% -1:200000 IJ SOLN
INTRAMUSCULAR | Status: DC | PRN
  Administered 2022-02-21: 6 mL

## 2022-02-21 MED ORDER — 0.9 % SODIUM CHLORIDE (POUR BTL) OPTIME
TOPICAL | Status: DC | PRN
  Administered 2022-02-21: 1000 mL

## 2022-02-21 MED ORDER — HYDROMORPHONE HCL 1 MG/ML IJ SOLN
0.2500 mg | INTRAMUSCULAR | Status: DC | PRN
  Administered 2022-02-21 (×3): 0.5 mg via INTRAVENOUS

## 2022-02-21 MED ORDER — METHOCARBAMOL 750 MG PO TABS: 750.0000 mg | ORAL_TABLET | Freq: Three times a day (TID) | ORAL | 1 refills | Status: DC | PRN

## 2022-02-21 MED ORDER — SPIRONOLACTONE 25 MG PO TABS
25.0000 mg | ORAL_TABLET | Freq: Every day | ORAL | Status: DC
  Administered 2022-02-22: 25 mg via ORAL
  Filled 2022-02-21: qty 1

## 2022-02-21 MED ORDER — FENTANYL CITRATE (PF) 100 MCG/2ML IJ SOLN: 100.0000 ug | Freq: Once | INTRAMUSCULAR | Status: AC

## 2022-02-21 MED ORDER — ONDANSETRON HCL 4 MG/2ML IJ SOLN
INTRAMUSCULAR | Status: DC | PRN
  Administered 2022-02-21: 4 mg via INTRAVENOUS

## 2022-02-21 MED ORDER — ORAL CARE MOUTH RINSE: 15.0000 mL | Freq: Once | OROMUCOSAL | Status: AC

## 2022-02-21 MED ORDER — METOPROLOL SUCCINATE ER 50 MG PO TB24
50.0000 mg | ORAL_TABLET | Freq: Every day | ORAL | Status: DC
  Administered 2022-02-22: 50 mg via ORAL
  Filled 2022-02-21: qty 1

## 2022-02-21 MED ORDER — ONDANSETRON 4 MG PO TBDP: 4.0000 mg | ORAL_TABLET | Freq: Four times a day (QID) | ORAL | Status: DC | PRN

## 2022-02-21 MED ORDER — OXYCODONE HCL 5 MG PO TABS
5.0000 mg | ORAL_TABLET | Freq: Once | ORAL | Status: AC | PRN
  Administered 2022-02-21: 5 mg via ORAL

## 2022-02-21 MED ORDER — METHYLENE BLUE 1 % INJ SOLN
INTRAVENOUS | Status: AC
  Filled 2022-02-21: qty 10

## 2022-02-21 MED ORDER — SUGAMMADEX SODIUM 200 MG/2ML IV SOLN
INTRAVENOUS | Status: DC | PRN
  Administered 2022-02-21: 200 mg via INTRAVENOUS

## 2022-02-21 MED ORDER — ACETAMINOPHEN 500 MG PO TABS: 500.0000 mg | ORAL_TABLET | Freq: Four times a day (QID) | ORAL | Status: DC | PRN

## 2022-02-21 MED ORDER — CHLORHEXIDINE GLUCONATE 0.12 % MT SOLN
15.0000 mL | Freq: Once | OROMUCOSAL | Status: AC
  Administered 2022-02-21: 15 mL via OROMUCOSAL
  Filled 2022-02-21: qty 15

## 2022-02-21 MED ORDER — CEFAZOLIN SODIUM-DEXTROSE 2-4 GM/100ML-% IV SOLN
2.0000 g | Freq: Three times a day (TID) | INTRAVENOUS | Status: AC
  Administered 2022-02-21: 2 g via INTRAVENOUS
  Filled 2022-02-21: qty 100

## 2022-02-21 MED ORDER — OXYCODONE HCL 5 MG/5ML PO SOLN: 5.0000 mg | Freq: Once | ORAL | Status: AC | PRN

## 2022-02-21 MED ORDER — FENTANYL CITRATE (PF) 250 MCG/5ML IJ SOLN
INTRAMUSCULAR | Status: AC
  Filled 2022-02-21: qty 5

## 2022-02-21 MED ORDER — FENTANYL CITRATE (PF) 100 MCG/2ML IJ SOLN
INTRAMUSCULAR | Status: AC
  Administered 2022-02-21: 100 ug via INTRAVENOUS
  Filled 2022-02-21: qty 2

## 2022-02-21 MED ORDER — ROCURONIUM BROMIDE 10 MG/ML (PF) SYRINGE
PREFILLED_SYRINGE | INTRAVENOUS | Status: AC
  Filled 2022-02-21: qty 10

## 2022-02-21 MED ORDER — OXYCODONE HCL 5 MG PO TABS
5.0000 mg | ORAL_TABLET | ORAL | Status: DC | PRN
  Administered 2022-02-21 – 2022-02-22 (×2): 10 mg via ORAL
  Filled 2022-02-21 (×2): qty 2

## 2022-02-21 MED ORDER — FENTANYL CITRATE (PF) 250 MCG/5ML IJ SOLN
INTRAMUSCULAR | Status: DC | PRN
  Administered 2022-02-21: 50 ug via INTRAVENOUS
  Administered 2022-02-21 (×2): 100 ug via INTRAVENOUS

## 2022-02-21 MED ORDER — CHLORHEXIDINE GLUCONATE CLOTH 2 % EX PADS: 6.0000 | MEDICATED_PAD | Freq: Once | CUTANEOUS | Status: DC

## 2022-02-21 MED ORDER — BUPIVACAINE-EPINEPHRINE (PF) 0.25% -1:200000 IJ SOLN
INTRAMUSCULAR | Status: AC
  Filled 2022-02-21: qty 30

## 2022-02-21 MED ORDER — CHLORHEXIDINE GLUCONATE CLOTH 2 % EX PADS
6.0000 | MEDICATED_PAD | Freq: Once | CUTANEOUS | Status: AC
  Administered 2022-02-21: 6 via TOPICAL

## 2022-02-21 MED ORDER — LIDOCAINE 2% (20 MG/ML) 5 ML SYRINGE
INTRAMUSCULAR | Status: DC | PRN
  Administered 2022-02-21: 100 mg via INTRAVENOUS

## 2022-02-21 MED ORDER — OXYCODONE HCL 5 MG PO TABS: 5.0000 mg | ORAL_TABLET | Freq: Three times a day (TID) | ORAL | 0 refills | Status: DC | PRN

## 2022-02-21 MED ORDER — DEXAMETHASONE SODIUM PHOSPHATE 10 MG/ML IJ SOLN
INTRAMUSCULAR | Status: DC | PRN
  Administered 2022-02-21: 10 mg via INTRAVENOUS

## 2022-02-21 MED ORDER — HYDROMORPHONE HCL 1 MG/ML IJ SOLN: 1.0000 mg | INTRAMUSCULAR | Status: DC | PRN

## 2022-02-21 MED ORDER — CEFAZOLIN IN SODIUM CHLORIDE 3-0.9 GM/100ML-% IV SOLN
3.0000 g | INTRAVENOUS | Status: AC
  Administered 2022-02-21: 3 g via INTRAVENOUS
  Filled 2022-02-21: qty 100

## 2022-02-21 MED ORDER — DIGOXIN 125 MCG PO TABS
125.0000 ug | ORAL_TABLET | Freq: Every day | ORAL | Status: DC
  Administered 2022-02-22: 125 ug via ORAL
  Filled 2022-02-21: qty 1

## 2022-02-21 MED ORDER — OXYCODONE HCL 5 MG PO TABS
ORAL_TABLET | ORAL | Status: AC
  Filled 2022-02-21: qty 1

## 2022-02-21 MED ORDER — PROPOFOL 10 MG/ML IV BOLUS
INTRAVENOUS | Status: DC | PRN
  Administered 2022-02-21: 200 mg via INTRAVENOUS

## 2022-02-21 MED ORDER — MIDAZOLAM HCL 2 MG/2ML IJ SOLN: 2.0000 mg | Freq: Once | INTRAMUSCULAR | Status: AC

## 2022-02-21 MED ORDER — LIDOCAINE 2% (20 MG/ML) 5 ML SYRINGE
INTRAMUSCULAR | Status: AC
  Filled 2022-02-21: qty 5

## 2022-02-21 MED ORDER — ONDANSETRON HCL 4 MG/2ML IJ SOLN: 4.0000 mg | Freq: Four times a day (QID) | INTRAMUSCULAR | Status: DC | PRN

## 2022-02-21 MED ORDER — PHENYLEPHRINE HCL-NACL 20-0.9 MG/250ML-% IV SOLN
INTRAVENOUS | Status: DC | PRN
  Administered 2022-02-21: 45 ug/min via INTRAVENOUS

## 2022-02-21 MED ORDER — KETOROLAC TROMETHAMINE 30 MG/ML IJ SOLN
30.0000 mg | Freq: Once | INTRAMUSCULAR | Status: AC | PRN
  Administered 2022-02-21: 30 mg via INTRAVENOUS

## 2022-02-21 MED ORDER — DEXTROSE-NACL 5-0.9 % IV SOLN: INTRAVENOUS | Status: DC

## 2022-02-21 MED ORDER — ROCURONIUM BROMIDE 10 MG/ML (PF) SYRINGE
PREFILLED_SYRINGE | INTRAVENOUS | Status: DC | PRN
  Administered 2022-02-21: 70 mg via INTRAVENOUS

## 2022-02-21 MED ORDER — ONDANSETRON HCL 4 MG PO TABS: 8.0000 mg | ORAL_TABLET | Freq: Two times a day (BID) | ORAL | Status: DC | PRN

## 2022-02-21 MED ORDER — LACTATED RINGERS IV SOLN: INTRAVENOUS | Status: DC

## 2022-02-21 MED ORDER — PHENYLEPHRINE 80 MCG/ML (10ML) SYRINGE FOR IV PUSH (FOR BLOOD PRESSURE SUPPORT)
PREFILLED_SYRINGE | INTRAVENOUS | Status: DC | PRN
  Administered 2022-02-21: 160 ug via INTRAVENOUS

## 2022-02-21 MED ORDER — CEFAZOLIN IN SODIUM CHLORIDE 3-0.9 GM/100ML-% IV SOLN
INTRAVENOUS | Status: AC
  Filled 2022-02-21: qty 100

## 2022-02-21 MED ORDER — DEXMEDETOMIDINE HCL IN NACL 200 MCG/50ML IV SOLN
INTRAVENOUS | Status: DC | PRN
  Administered 2022-02-21 (×6): 8 ug via INTRAVENOUS

## 2022-02-21 MED ORDER — KETOROLAC TROMETHAMINE 30 MG/ML IJ SOLN
INTRAMUSCULAR | Status: AC
  Filled 2022-02-21: qty 1

## 2022-02-21 MED ORDER — PROPOFOL 10 MG/ML IV BOLUS
INTRAVENOUS | Status: AC
  Filled 2022-02-21: qty 20

## 2022-02-21 MED ORDER — MIDAZOLAM HCL 2 MG/2ML IJ SOLN
INTRAMUSCULAR | Status: AC
  Administered 2022-02-21: 2 mg via INTRAVENOUS
  Filled 2022-02-21: qty 2

## 2022-02-21 MED ORDER — DEXAMETHASONE SODIUM PHOSPHATE 10 MG/ML IJ SOLN
INTRAMUSCULAR | Status: AC
  Filled 2022-02-21: qty 1

## 2022-02-21 MED ORDER — METHOCARBAMOL 500 MG PO TABS: 500.0000 mg | ORAL_TABLET | Freq: Four times a day (QID) | ORAL | Status: DC | PRN

## 2022-02-21 MED ORDER — ATORVASTATIN CALCIUM 10 MG PO TABS
20.0000 mg | ORAL_TABLET | Freq: Every day | ORAL | Status: DC
  Administered 2022-02-22: 20 mg via ORAL
  Filled 2022-02-21: qty 2

## 2022-02-21 MED ORDER — SACUBITRIL-VALSARTAN 49-51 MG PO TABS
1.0000 | ORAL_TABLET | Freq: Two times a day (BID) | ORAL | Status: DC
  Administered 2022-02-21 – 2022-02-22 (×2): 1 via ORAL
  Filled 2022-02-21 (×2): qty 1

## 2022-02-21 MED ORDER — MIDAZOLAM HCL 2 MG/2ML IJ SOLN
INTRAMUSCULAR | Status: AC
  Filled 2022-02-21: qty 2

## 2022-02-21 MED ORDER — MIDAZOLAM HCL 2 MG/2ML IJ SOLN
INTRAMUSCULAR | Status: DC | PRN
  Administered 2022-02-21: 2 mg via INTRAVENOUS

## 2022-02-21 MED ORDER — ONDANSETRON HCL 4 MG/2ML IJ SOLN: 4.0000 mg | Freq: Once | INTRAMUSCULAR | Status: DC | PRN

## 2022-02-21 MED ORDER — ONDANSETRON HCL 4 MG/2ML IJ SOLN
INTRAMUSCULAR | Status: AC
Start: 2022-02-21 — End: ?
  Filled 2022-02-21: qty 2

## 2022-02-21 SURGICAL SUPPLY — 57 items
ADH SKN CLS APL DERMABOND .7 (GAUZE/BANDAGES/DRESSINGS) ×2
APL PRP STRL LF DISP 70% ISPRP (MISCELLANEOUS) ×2
APPLIER CLIP 9.375 MED OPEN (MISCELLANEOUS) ×9
APR CLP MED 9.3 20 MLT OPN (MISCELLANEOUS) ×6
BAG COUNTER SPONGE SURGICOUNT (BAG) ×3 IMPLANT
BAG SPNG CNTER NS LX DISP (BAG) ×2
BINDER BREAST LRG (GAUZE/BANDAGES/DRESSINGS) IMPLANT
BINDER BREAST XLRG (GAUZE/BANDAGES/DRESSINGS) IMPLANT
CANISTER SUCT 3000ML PPV (MISCELLANEOUS) ×3 IMPLANT
CHLORAPREP W/TINT 10.5 ML (MISCELLANEOUS) ×3 IMPLANT
CHLORAPREP W/TINT 26 (MISCELLANEOUS) ×3 IMPLANT
CLIP APPLIE 9.375 MED OPEN (MISCELLANEOUS) ×2 IMPLANT
CNTNR URN SCR LID CUP LEK RST (MISCELLANEOUS) ×2 IMPLANT
CONT SPEC 4OZ STRL OR WHT (MISCELLANEOUS) ×3
COVER PROBE W GEL 5X96 (DRAPES) ×3 IMPLANT
COVER SURGICAL LIGHT HANDLE (MISCELLANEOUS) ×3 IMPLANT
DERMABOND ADVANCED (GAUZE/BANDAGES/DRESSINGS) ×1
DERMABOND ADVANCED .7 DNX12 (GAUZE/BANDAGES/DRESSINGS) ×2 IMPLANT
DRAIN CHANNEL 19F RND (DRAIN) ×3 IMPLANT
DRAPE LAPAROSCOPIC ABDOMINAL (DRAPES) ×3 IMPLANT
DRAPE LAPAROTOMY 100X72 PEDS (DRAPES) ×3 IMPLANT
ELECT REM PT RETURN 9FT ADLT (ELECTROSURGICAL) ×3
ELECTRODE REM PT RTRN 9FT ADLT (ELECTROSURGICAL) ×2 IMPLANT
EVACUATOR SILICONE 100CC (DRAIN) ×3 IMPLANT
GAUZE 4X4 16PLY ~~LOC~~+RFID DBL (SPONGE) ×3 IMPLANT
GLOVE BIO SURGEON STRL SZ8 (GLOVE) ×3 IMPLANT
GLOVE BIOGEL PI IND STRL 8 (GLOVE) ×2 IMPLANT
GLOVE BIOGEL PI INDICATOR 8 (GLOVE) ×1
GOWN STRL REUS W/ TWL LRG LVL3 (GOWN DISPOSABLE) ×6 IMPLANT
GOWN STRL REUS W/ TWL XL LVL3 (GOWN DISPOSABLE) ×2 IMPLANT
GOWN STRL REUS W/TWL LRG LVL3 (GOWN DISPOSABLE) ×9
GOWN STRL REUS W/TWL XL LVL3 (GOWN DISPOSABLE) ×3
HEMOSTAT ARISTA ABSORB 3G PWDR (HEMOSTASIS) ×4 IMPLANT
KIT BASIN OR (CUSTOM PROCEDURE TRAY) ×3 IMPLANT
KIT TURNOVER KIT B (KITS) ×3 IMPLANT
NDL 18GX1X1/2 (RX/OR ONLY) (NEEDLE) ×2 IMPLANT
NDL FILTER BLUNT 18X1 1/2 (NEEDLE) IMPLANT
NDL HYPO 25GX1X1/2 BEV (NEEDLE) ×2 IMPLANT
NEEDLE 18GX1X1/2 (RX/OR ONLY) (NEEDLE) ×3 IMPLANT
NEEDLE FILTER BLUNT 18X 1/2SAF (NEEDLE)
NEEDLE FILTER BLUNT 18X1 1/2 (NEEDLE) IMPLANT
NEEDLE HYPO 25GX1X1/2 BEV (NEEDLE) ×3 IMPLANT
NS IRRIG 1000ML POUR BTL (IV SOLUTION) ×3 IMPLANT
PACK GENERAL/GYN (CUSTOM PROCEDURE TRAY) ×3 IMPLANT
PAD ARMBOARD 7.5X6 YLW CONV (MISCELLANEOUS) ×6 IMPLANT
PENCIL SMOKE EVACUATOR (MISCELLANEOUS) ×3 IMPLANT
SPECIMEN JAR X LARGE (MISCELLANEOUS) ×3 IMPLANT
SPIKE FLUID TRANSFER (MISCELLANEOUS) ×3 IMPLANT
SUT ETHILON 3 0 FSL (SUTURE) ×3 IMPLANT
SUT MNCRL AB 3-0 PS2 27 (SUTURE) ×1 IMPLANT
SUT MNCRL AB 4-0 PS2 18 (SUTURE) ×3 IMPLANT
SUT VIC AB 3-0 SH 18 (SUTURE) ×3 IMPLANT
SUT VIC AB 3-0 SH 27 (SUTURE) ×3
SUT VIC AB 3-0 SH 27X BRD (SUTURE) ×2 IMPLANT
SYR CONTROL 10ML LL (SYRINGE) ×3 IMPLANT
TOWEL GREEN STERILE (TOWEL DISPOSABLE) ×3 IMPLANT
TOWEL GREEN STERILE FF (TOWEL DISPOSABLE) ×3 IMPLANT

## 2022-02-21 NOTE — Anesthesia Procedure Notes (Signed)
Anesthesia Procedure Image    

## 2022-02-21 NOTE — Anesthesia Procedure Notes (Signed)
Anesthesia Regional Block: Pectoralis block   Pre-Anesthetic Checklist: , timeout performed,  Correct Patient, Correct Site, Correct Laterality,  Correct Procedure, Correct Position, site marked,  Risks and benefits discussed,  Surgical consent,  Pre-op evaluation,  At surgeon's request and post-op pain management  Laterality: Left  Prep: chloraprep       Needles:  Injection technique: Single-shot  Needle Type: Echogenic Needle     Needle Length: 9cm      Additional Needles:   Procedures:,,,, ultrasound used (permanent image in chart),,    Narrative:  Start time: 02/21/2022 12:31 PM End time: 02/21/2022 12:39 PM Injection made incrementally with aspirations every 5 mL.  Performed by: Personally  Anesthesiologist: Myrtie Soman, MD  Additional Notes: Patient tolerated the procedure well without complications

## 2022-02-21 NOTE — H&P (Signed)
History of Present Illness: Michael Stuart is a 41 y.o. male who is seen today as an office consultation for evaluation of Breast Cancer .   Patient sent at the request of Dr. Payton Mccallum of oncology. He was noted to have a left breast mass 4 months ago after being seen in the emergency room. 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 He had a stroke at that time. He was noted to have a 3 . 8 centimeter mass in his left breast that is been present for about 8 months prior to this he states. 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 He also had a core biopsy of the left axillar lymph node that was positive. On his initial imaging he had multiple left axillar lymph nodes that were large. He is completing chemotherapy next month and presents to discuss surgical options. He is recovered from his stroke but is on anticoagulation for his mural thrombus. He has had no episodes of shortness of breath, chest pain or dyspnea.  Review of Systems: A complete review of systems was obtained from the patient. I have reviewed this information and discussed as appropriate with the patient. See HPI as well for other ROS.    Medical History: Past Medical History:  Diagnosis Date  CHF (congestive heart failure) (CMS-HCC)  DVT (deep venous thrombosis) (CMS-HCC)  History of cancer  History of myocardial infarction   There is no problem list on file for this patient.  Past Surgical History:  Procedure Laterality Date  INCISION & DRAINAGE PILONIDAL CYST 02/1998    Allergies  Allergen Reactions  Bactrim [Sulfamethoxazole-Trimethoprim] Unknown   Current Outpatient Medications on File Prior to Visit  Medication Sig Dispense Refill  apixaban (ELIQUIS) 5 mg tablet Take 1 tablet by mouth 2 (two) times daily  atorvastatin (LIPITOR) 20 MG tablet Take 20 mg by mouth once daily  dapagliflozin (FARXIGA) 10 mg tablet Take 1 tablet by mouth once daily   dexAMETHasone (DECADRON) 4 MG tablet Take by mouth  digoxin (LANOXIN) 0.125 MG tablet Take 1 tablet by mouth once daily  ENTRESTO 49-51 mg tablet Take 1 tablet by mouth 2 (two) times daily  eplerenone (INSPRA) 25 MG tablet Take 2 tablets by mouth once daily  ivabradine (CORLANOR) 5 mg tablet Take by mouth  metoprolol succinate (TOPROL-XL) 25 MG XL tablet Take 1 tablet by mouth once daily  ondansetron (ZOFRAN) 8 MG tablet Take by mouth  prochlorperazine (COMPAZINE) 10 MG tablet Take by mouth   No current facility-administered medications on file prior to visit.   Family History  Problem Relation Age of Onset  High blood pressure (Hypertension) Father  Coronary Artery Disease (Blocked arteries around heart) Father    Social History   Tobacco Use  Smoking Status Every Day  Types: Cigarettes  Smokeless Tobacco Not on file    Social History   Socioeconomic History  Marital status: Married  Tobacco Use  Smoking status: Every Day  Types: Cigarettes  Vaping Use  Vaping Use: Never used  Substance and Sexual Activity  Alcohol use: Yes  Drug use: Never   Objective:   Vitals:  12/11/21 1420  BP: 114/86  Pulse: (!) 134  Temp: 36.7 C (98.1 F)  SpO2: 99%  Weight: (!) 119.4 kg (263 lb 3.2 oz)  Height: 188 cm (6' 2" )   Body mass index is 33.79 kg/m.  Physical Exam Constitutional:  Appearance: Normal appearance.  Eyes:  Pupils: Pupils  are equal, round, and reactive to light.  Cardiovascular:  Rate and Rhythm: Normal rate.  Pulmonary:  Effort: Pulmonary effort is normal.  Breath sounds: Normal breath sounds.  Chest:  Breasts: Right: Normal.   Comments: Left nipple retraction noted. No obvious mass Musculoskeletal:  General: Normal range of motion.  Lymphadenopathy:  Upper Body:  Right upper body: No supraclavicular or axillary adenopathy.  Left upper body: No supraclavicular or axillary adenopathy.  Skin: General: Skin is warm.  Neurological:  General: No  focal deficit present.  Mental Status: He is alert.  Psychiatric:  Mood and Affect: Mood normal.  Behavior: Behavior normal.    Labs, Imaging and Diagnostic Testing:  Palpable mass in the LEFT breast for 8 months. CT exam performed 08/27/2021 in the emergency department shows 3.8 centimeter LEFT breast mass and LEFT axillary adenopathy. Patient has had recent stroke and has LEFT ventricular thrombus based on MRI 08/29/2021  EXAM: DIGITAL DIAGNOSTIC BILATERAL MAMMOGRAM WITH TOMOSYNTHESIS AND CAD; [place] AXILLARY RIGHT; ULTRASOUND LEFT BREAST LIMITED  TECHNIQUE: Bilateral digital diagnostic mammography and breast tomosynthesis was performed. The images were evaluated with computer-aided detection.; Targeted ultrasound examination of the right axilla was performed.; Targeted ultrasound examination of the left breast was performed.  COMPARISON: 08/27/2021, 08/29/2021  ACR Breast Density Category b: There are scattered areas of fibroglandular density.  FINDINGS: RIGHT breast: There is minimal retroareolar fibroglandular tissue. Otherwise the breast is negative. Partially imaged RIGHT axillary lymph node is indeterminate.  Targeted ultrasound is performed, showing normal RIGHT axillary lymph nodes.  LEFT breast: There is irregular mass associated with spiculated margins in the immediate retroareolar region of the LEFT breast, retracting the LEFT nipple. Numerous enlarged LEFT axillary lymph nodes are present.  On physical exam, the LEFT nipple is retracted and fixed. I palpate a mass measuring at least 5 centimeters in the immediate retroareolar region of the LEFT breast.  Targeted ultrasound is performed, showing an irregular hypoechoic mass in the retroareolar region of the LEFT breast measuring 4.0 x 2.2 x 4.1 centimeters. Mass is associated with increased vascularity.  Evaluation of the LEFT axilla shows numerous lymph nodes with cortical thickening. A total of 5  abnormal lymph nodes are present. A single LOWER axillary lymph node has normal morphology.  IMPRESSION: 1. RIGHT breast and axilla are negative. 2. Suspicious mass in the retroareolar region of the LEFT breast. 3. Five abnormal LEFT axillary nodes.  RECOMMENDATION: Recommend ultrasound-guided core biopsy of LEFT breast mass and 1 of the enlarged LEFT axillary.  Patient should continue Eliquis.  I have discussed the findings and recommendations with the patient and his mother. If applicable, a reminder letter will be sent to the patient regarding the next appointment.  BI-RADS CATEGORY 5: Highly suggestive of malignancy.   Electronically Signed By: Nolon Nations M.D. On: 09/06/2021 11:58   ADDITIONAL INFORMATION: 1. FLUORESCENCE IN-SITU HYBRIDIZATION Results: GROUP 5: HER2 **NEGATIVE** Equivocal form of amplification of the HER2 gene was detected in the IHC 2+ tissue sample received from this individual. HER2 FISH was performed by a technologist and cell imaging and analysis on the [employer]. RATIO OF HER2/CEN17 SIGNALS 1.25 AVERAGE HER2 COPY NUMBER PER CELL 3.45 The ratio of HER2/CEN 17 is within the range < 2.0 of HER2/CEN 17 and a copy number of HER2 signals per cell is <4.0. Arch Pathol Lab Med 1:1,2018 Thressa Sheller MD Pathologist, Electronic Signature ( Signed 09/21/2021) 1. PROGNOSTIC INDICATORS Results: IMMUNOHISTOCHEMICAL AND MORPHOMETRIC ANALYSIS PERFORMED MANUALLY The tumor cells are EQUIVOCAL for Her2 (2+). Her2  by [facility] will be performed and the results reported separately. Estrogen Receptor: 100%, POSITIVE, STRONG STAINING INTENSITY Progesterone Receptor: 50%, POSITIVE, STRONG STAINING INTENSITY Proliferation Marker Ki67: 25% REFERENCE RANGE ESTROGEN RECEPTOR NEGATIVE 0% POSITIVE =>1% REFERENCE RANGE PROGESTERONE RECEPTOR 1 of 3 FINAL for Fales, Jakhai D ([id number]) ADDITIONAL INFORMATION:(continued) NEGATIVE 0% POSITIVE =>1% All controls stained  appropriately Thressa Sheller MD Pathologist, Electronic Signature ( Signed 09/21/2021) FINAL DIAGNOSIS Diagnosis 1. Breast, left, needle core biopsy, left breast 1 o clock mass - INVASIVE DUCTAL CARCINOMA - SEE COMMENT 2. Lymph node, needle/core biopsy, left axillary node - INVASIVE DUCTAL CARCINOMA - NO NODAL TISSUE IDENTIFIED - SEE COMMENT Microscopic Comment 1. Based on the biopsy, the carcinoma appears Nottingham grade 2 of 3 and measures 1.4 cm in greatest linear extent. Prognostic markers (ER/PR/ki-67/HER2) are pending and will be reported in an addendum. Dr. Spero Curb has reviewed the case and agrees with above diagnosis. These results were called to [facility] on September 18, 2021. 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 He has had a stroke and a ventricular thrombus. He is managed by cardiology. Plan will be for left simple mastectomy with targeted left axillary lymph node biopsy using a seed in left axillary sentinel lymph node mapping. We will take his port as well if okay with his oncologist. Risk of bleeding, infection, flap necrosis, lymphedema, shoulder stiffness, cardiovascular event, exacerbation of Medical problems, injury to major vascular structure, nerve structure vein and the need for the treatment center procedures. He voices understanding agrees to proceed.  No follow-ups on file.  Kennieth Francois, MD

## 2022-02-21 NOTE — Anesthesia Procedure Notes (Signed)
Procedure Name: Intubation Date/Time: 02/21/2022 1:13 PM  Performed by: Darletta Moll, CRNAPre-anesthesia Checklist: Patient identified, Emergency Drugs available, Suction available and Patient being monitored Patient Re-evaluated:Patient Re-evaluated prior to induction Oxygen Delivery Method: Circle system utilized Preoxygenation: Pre-oxygenation with 100% oxygen Induction Type: IV induction Ventilation: Two handed mask ventilation required and Oral airway inserted - appropriate to patient size Laryngoscope Size: Mac and 4 Grade View: Grade I Tube type: Oral Tube size: 7.5 mm Number of attempts: 1 Airway Equipment and Method: Stylet and Oral airway Placement Confirmation: ETT inserted through vocal cords under direct vision, positive ETCO2 and breath sounds checked- equal and bilateral Secured at: 23 cm Tube secured with: Tape Dental Injury: Teeth and Oropharynx as per pre-operative assessment

## 2022-02-21 NOTE — Discharge Instructions (Addendum)
[facility] [place] [phone]  MASTECTOMY: POST OP INSTRUCTIONS  Always review your discharge instruction sheet given to you by the facility where your surgery was performed. IF YOU HAVE DISABILITY OR FAMILY LEAVE FORMS, YOU MUST BRING THEM TO THE OFFICE FOR PROCESSING.   DO NOT GIVE THEM TO YOUR DOCTOR. A prescription for pain medication may be given to you upon discharge.  Take your pain medication as prescribed, if needed.  If narcotic pain medicine is not needed, then you may take acetaminophen (Tylenol) or ibuprofen (Advil) as needed. Take your usually prescribed medications unless otherwise directed. If you need a refill on your pain medication, please contact your pharmacy.  They will contact our office to request authorization.  Prescriptions will not be filled after 5pm or on week-ends. You should follow a light diet the first few days after arrival home, such as soup and crackers, etc.  Resume your normal diet the day after surgery. Most patients will experience some swelling and bruising on the chest and underarm.  Ice packs will help.  Swelling and bruising can take several days to resolve.  It is common to experience some constipation if taking pain medication after surgery.  Increasing fluid intake and taking a stool softener (such as Colace) will usually help or prevent this problem from occurring.  A mild laxative (Milk of Magnesia or Miralax) should be taken according to package instructions if there are no bowel movements after 48 hours. Unless discharge instructions indicate otherwise, leave your bandage dry and in place until your next appointment in 3-5 days.  You may take a limited sponge bath.  No tube baths or showers until the drains are removed.  You may have steri-strips (small skin tapes) in place directly over the incision.  These strips should be left on the skin for 7-10 days.  If your surgeon used skin glue on the incision, you may shower in 24 hours.   The glue will flake off over the next 2-3 weeks.  Any sutures or staples will be removed at the office during your follow-up visit. DRAINS:  If you have drains in place, it is important to keep a list of the amount of drainage produced each day in your drains.  Before leaving the hospital, you should be instructed on drain care.  Call our office if you have any questions about your drains. ACTIVITIES:  You may resume regular (light) daily activities beginning the next day--such as daily self-care, walking, climbing stairs--gradually increasing activities as tolerated.  You may have sexual intercourse when it is comfortable.  Refrain from any heavy lifting or straining until approved by your doctor. You may drive when you are no longer taking prescription pain medication, you can comfortably wear a seatbelt, and you can safely maneuver your car and apply brakes. RETURN TO WORK:  __________________________________________________________ You should see your doctor in the office for a follow-up appointment approximately 3-5 days after your surgery.  Your doctor's nurse will typically make your follow-up appointment when she calls you with your pathology report.  Expect your pathology report 2-3 business days after your surgery.  You may call to check if you do not hear from us after three days.   OTHER INSTRUCTIONS: ______________________________________________________________________________________________ ____________________________________________________________________________________________ WHEN TO CALL YOUR DOCTOR: Fever over 101.0 Nausea and/or vomiting Extreme swelling or bruising Continued bleeding from incision. Increased pain, redness, or drainage from the incision. The clinic staff is available to answer your questions during regular business hours.  Please don't hesitate   to call and ask to speak to one of the nurses for clinical concerns.  If you have a medical emergency, go to the  nearest emergency room or call 911.  A surgeon from [facility] is always on call at the hospital. [address] [place], [place], [place]  [address] ? [address], [place], [place]   [address] [phone] ? [phone] ? FAX [phone] Web site: [email or web address]         Restart blood thinner on Friday

## 2022-02-21 NOTE — Transfer of Care (Signed)
Immediate Anesthesia Transfer of Care Note  Patient: Michael Stuart  Procedure(s) Performed: LEFT SIMPLE MASTECTOMY WITH LEFT TARGETED LYMPH NODE BIOPSY (Left: Chest) LEFT SENTINEL LYMPH NODE MAPPING (Left: Chest) PORT REMOVAL (Right: Chest)  Patient Location: PACU  Anesthesia Type:General  Level of Consciousness: drowsy and patient cooperative  Airway & Oxygen Therapy: Patient Spontanous Breathing and Patient connected to nasal cannula oxygen  Post-op Assessment: Report given to RN, Post -op Vital signs reviewed and stable and Patient moving all extremities X 4  Post vital signs: Reviewed and stable  Last Vitals:  Vitals Value Taken Time  BP 151/86 02/21/22 1525  Temp    Pulse 85 02/21/22 1528  Resp 18 02/21/22 1528  SpO2 93 % 02/21/22 1528  Vitals shown include unvalidated device data.  Last Pain:  Vitals:   02/21/22 1144  TempSrc:   PainSc: 0-No pain         Complications: No notable events documented.

## 2022-02-21 NOTE — Interval H&P Note (Signed)
History and Physical Interval Note:  02/21/2022 12:33 PM  Michael Stuart  has presented today for surgery, with the diagnosis of LEFT BREAST CANCER.  The various methods of treatment have been discussed with the patient and family. After consideration of risks, benefits and other options for treatment, the patient has consented to  Procedure(s) with comments: LEFT SIMPLE MASTECTOMY WITH LEFT TARGETED LYMPH NODE BIOPSY (Left) - GEN w/ PEC BLOCK LEFT SENTINEL LYMPH NODE MAPPING (Left) PORT REMOVAL (N/A) as a surgical intervention.  The patient's history has been reviewed, patient examined, no change in status, stable for surgery.  I have reviewed the patient's chart and labs.  Questions were answered to the patient's satisfaction.     [place]

## 2022-02-21 NOTE — Op Note (Signed)
Preoperative diagnosis: Stage III left breast cancer overlapping sites  Postoperative diagnosis: Same  Procedure: Attempted left simple mastectomy with subsequent left modified radical mastectomy, attempted left axillary sentinel lymph node mapping, attempted targeted left axillar lymph node biopsy and port removal  Surgeon: Erroll Luna, MD  Anesthesia: General with 0.25% Marcaine local and left pectoral block  Drains: 19 round  Specimen: Left breast pathology with left x-ray contents which contained radioactive seed and targeted node  EBL: 10 cc  Indications for procedure: The patient is a 41 year old male with stage III left breast cancer.  He was placed on neoadjuvant chemotherapy and presents today for surgical management. The surgical and non surgical options have been discussed with the patient.  Risks of surgery include bleeding,  Infection,  Flap necrosis,  Tissue loss,  Chronic pain, death, Numbness,  And the need for additional procedures.  Reconstruction options also have been discussed with the patient as well.  The patient agrees to proceed.    Description of procedure: The patient was met in the holding area.  Of note he had a seed placed in his targeted left Eksir node 24 hours ago.  All questions were answered.  Pectoral block administered per anesthesia.  He was then taken back to the operating.  He was placed upon upon the OR table.  After induction of general esthesia, a timeout was performed and under sterile conditions 2 cc of mag tracer injected in a subareolar position.  This was massaged for 5 minutes.  We then prepped and draped the upper chest in a sterile fashion.  A second timeout was performed.  The port was removed first.  Local anesthetic was infiltrated in the scar located below the right clavicle.  Incision was made.  The port was identified and removed without difficulty.  Port tract closed with 2-0 Vicryl.  Skin edges approximated 2-0 Vicryl and 3-0 Monocryl  used to close skin.  The mastectomy was performed next.  Curvilinear incision made above and below the nipple-areolar complex.  The superior skin flap taken of the clavicle.  The inferior skin flap was taken to the inframammary fold.  The breast was then dissected off the pectoralis muscle taking the fascia with it in a medial to lateral fashion until the lateral attachments were encountered where it was amputated.  This was oriented and passed off the field.  Neoprobe was used to identify the seed in the right axilla.  Dissection was carried down toward the seed with her numerous bulky abnormal lymph nodes in the level 1 lymph node basin as well as extending partially into the level 2 basin.  Given the gross abnormality encountered, concern was for residual disease after neoadjuvant chemotherapy.  This juncture I felt best to do a completion node dissection given his young age and significant abnormality of the lymph nodes which were multiple in these lymph node basins.  The extra contents were mobilized with a combination of cautery and dissection and clips.  The long thoracic nerve was identified and preserved.  The vessels to the x-ray contents were dissected out and clipped.  X-ray vein was identified and preserved.  The thoracodorsal trunk was identified and preserved.  He had bulky adenopathy extending along the thoracodorsal trunk that was dissected off carefully preserving the structures.  There were a few scattered small nodes in the level 2 basin that are removed individually.  The remainder of this felt normal as well as palpating up to the level 3 basin.  These  nodes removed en bloc which contained the seed.  Imaging revealed the seed to be in the specimen as well is using the neoprobe.  Irrigation was used.  Hemostasis was achieved with combination of cautery.  Arista was placed in the axilla and under the flaps.  A 19 round drain was placed through the inferior skin flap and secured with 3-0 nylon.   Hemostasis was excellent.  Wound was then closed with a combination of 3-0 Vicryl and 4 Monocryl.  Dermabond applied to all incisions.  All counts found to be correct.  The patient was then awoken taken to recovery in satisfactory condition.

## 2022-02-22 ENCOUNTER — Encounter (HOSPITAL_COMMUNITY): Payer: Self-pay | Admitting: Surgery

## 2022-02-22 DIAGNOSIS — Z7901 Long term (current) use of anticoagulants: Secondary | ICD-10-CM | POA: Diagnosis not present

## 2022-02-22 DIAGNOSIS — F17210 Nicotine dependence, cigarettes, uncomplicated: Secondary | ICD-10-CM | POA: Diagnosis not present

## 2022-02-22 DIAGNOSIS — Z79899 Other long term (current) drug therapy: Secondary | ICD-10-CM | POA: Diagnosis not present

## 2022-02-22 DIAGNOSIS — Z86718 Personal history of other venous thrombosis and embolism: Secondary | ICD-10-CM | POA: Diagnosis not present

## 2022-02-22 DIAGNOSIS — C50822 Malignant neoplasm of overlapping sites of left male breast: Secondary | ICD-10-CM | POA: Diagnosis not present

## 2022-02-22 DIAGNOSIS — I509 Heart failure, unspecified: Secondary | ICD-10-CM | POA: Diagnosis not present

## 2022-02-22 DIAGNOSIS — Z170 Estrogen receptor positive status [ER+]: Secondary | ICD-10-CM | POA: Diagnosis not present

## 2022-02-22 LAB — BASIC METABOLIC PANEL
Anion gap: 10 (ref 5–15)
BUN: 14 mg/dL (ref 6–20)
CO2: 22 mmol/L (ref 22–32)
Calcium: 8.7 mg/dL — ABNORMAL LOW (ref 8.9–10.3)
Chloride: 103 mmol/L (ref 98–111)
Creatinine, Ser: 0.82 mg/dL (ref 0.61–1.24)
GFR, Estimated: >60 mL/min (ref >60)
Glucose, Bld: 129 mg/dL — ABNORMAL HIGH (ref 70–99)
Potassium: 4.2 mmol/L (ref 3.5–5.1)
Sodium: 135 mmol/L (ref 135–145)

## 2022-02-22 LAB — CBC
HCT: 41.6 % (ref 39.0–52.0)
Hemoglobin: 13.9 g/dL (ref 13.0–17.0)
MCH: 33.1 pg (ref 26.0–34.0)
MCHC: 33.4 g/dL (ref 30.0–36.0)
MCV: 99 fL (ref 80.0–100.0)
Platelets: 196 10*3/uL (ref 150–400)
RBC: 4.2 MIL/uL — ABNORMAL LOW (ref 4.22–5.81)
RDW: 14.4 % (ref 11.5–15.5)
WBC: 15.3 10*3/uL — ABNORMAL HIGH (ref 4.0–10.5)
nRBC: 0 % (ref 0.0–0.2)

## 2022-02-22 MED ORDER — ACETAMINOPHEN 500 MG PO TABS
500.0000 mg | ORAL_TABLET | Freq: Four times a day (QID) | ORAL | Status: DC
  Administered 2022-02-22: 500 mg via ORAL
  Filled 2022-02-22: qty 1

## 2022-02-22 MED ORDER — METHOCARBAMOL 500 MG PO TABS
500.0000 mg | ORAL_TABLET | Freq: Four times a day (QID) | ORAL | Status: DC
  Administered 2022-02-22: 500 mg via ORAL
  Filled 2022-02-22: qty 1

## 2022-02-22 NOTE — Anesthesia Postprocedure Evaluation (Signed)
Anesthesia Post Note  Patient: Michael Stuart  Procedure(s) Performed: LEFT SIMPLE MASTECTOMY WITH LEFT TARGETED LYMPH NODE BIOPSY (Left: Chest) LEFT SENTINEL LYMPH NODE MAPPING (Left: Chest) PORT REMOVAL (Right: Chest)     Patient location during evaluation: PACU Anesthesia Type: General Level of consciousness: awake and alert Pain management: pain level controlled Vital Signs Assessment: post-procedure vital signs reviewed and stable Respiratory status: spontaneous breathing, nonlabored ventilation, respiratory function stable and patient connected to nasal cannula oxygen Cardiovascular status: blood pressure returned to baseline and stable Postop Assessment: no apparent nausea or vomiting Anesthetic complications: no   No notable events documented.  Last Vitals:  Vitals:   02/22/22 0003 02/22/22 0452  BP: 134/80 136/82  Pulse: 94 94  Resp: 18 18  Temp: 36.7 C 36.8 C  SpO2: 96% 95%    Last Pain:  Vitals:   02/22/22 0543  TempSrc:   PainSc: Asleep                 Braydn Carneiro S

## 2022-02-22 NOTE — TOC Transition Note (Signed)
Transition of Care [facility]) - CM/SW Discharge Note   Patient Details  Name: Michael Stuart MRN: [id number] Date of Birth: [birth year]  [facility] CM/SW Contact:  Pollie Friar, RN Phone Number: 02/22/2022, 10:07 AM   Clinical Narrative:    Pt is discharging home with self care. No needs per TOC.   Final next level of care: Home/Self Care Barriers to Discharge: No Barriers Identified   Patient Goals and CMS Choice        Discharge Placement                       Discharge Plan and Services                                     Social Determinants of Health (SDOH) Interventions     Readmission Risk Interventions     No data to display

## 2022-02-22 NOTE — Discharge Summary (Signed)
[facility] Discharge Summary   Patient ID: Michael Stuart MRN: [id number] DOB/AGE: 10/05/1980 41 y.o.  Admit date: 02/21/2022 Discharge date: 02/22/2022  Admitting Diagnosis: Stage III left breast cancer overlapping sites  Discharge Diagnosis Same  Consultants None  Imaging: MM Breast Surgical Specimen  Result Date: 02/21/2022 CLINICAL DATA:  Status post LEFT axillary lymph node excision today after earlier radioactive seed localization. EXAM: SPECIMEN RADIOGRAPH OF THE LEFT AXILLA COMPARISON:  Previous exam(s). FINDINGS: Status post excision of the LEFT axilla. The radioactive seed is present and appear completely intact within the specimen. The radioactive seed appears to be partially obscuring an underlying biopsy clip. Findings discussed with the OR staff during the procedure. IMPRESSION: Specimen radiograph of the LEFT axilla. Electronically Signed   By: Stan  Maynard M.D.   On: 02/21/2022 15:11  US LT RADIOACTIVE SEED LOC  Result Date: 02/20/2022 CLINICAL DATA:  41-year-old male for radioactive seed localization of LEFT axillary lymph node metastasis. Patient will undergo LEFT mastectomy for LEFT breast cancer. EXAM: ULTRASOUND GUIDED RADIOACTIVE SEED LOCALIZATION OF THE LEFT AXILLA LEFT MAMMOGRAM POST SEED PLACEMENT COMPARISON:  Previous exam(s). FINDINGS: Patient presents for radioactive seed localization prior to targeted LEFT axillary lymph node excision. I met with the patient and we discussed the procedure of seed localization including benefits and alternatives. We discussed the high likelihood of a successful procedure. We discussed the risks of the procedure including infection, bleeding, tissue injury and further surgery. We discussed the low dose of radioactivity involved in the procedure. Informed, written consent was given. The usual time-out protocol was performed immediately prior to the procedure. Using ultrasound guidance, sterile technique, 1% lidocaine and  an I-125 radioactive seed, the LEFT axillary lymph node containing biopsy clip was localized using a MEDIAL approach. Follow-up single LEFT mammogram image confirm the seed in the expected location adjacent to the [facility] biopsy clip and were marked for Dr. Cornett. Follow-up survey of the patient confirms presence of the radioactive seed. Order number of I-125 seed:  [id number]. Total activity:  0.241 millicuries.  Reference Date: 01/12/2022 The patient tolerated the procedure well and [facility] He was given instructions regarding seed removal. IMPRESSION: Radioactive seed localization of LEFT axillary lymph node. No apparent complications. Electronically Signed   By: Jeffrey  Hu M.D.   On: 02/20/2022 11:38  MM CLIP PLACEMENT LEFT  Result Date: 02/20/2022 CLINICAL DATA:  41-year-old male for radioactive seed localization of LEFT axillary lymph node metastasis. Patient will undergo LEFT mastectomy for LEFT breast cancer. EXAM: ULTRASOUND GUIDED RADIOACTIVE SEED LOCALIZATION OF THE LEFT AXILLA LEFT MAMMOGRAM POST SEED PLACEMENT COMPARISON:  Previous exam(s). FINDINGS: Patient presents for radioactive seed localization prior to targeted LEFT axillary lymph node excision. I met with the patient and we discussed the procedure of seed localization including benefits and alternatives. We discussed the high likelihood of a successful procedure. We discussed the risks of the procedure including infection, bleeding, tissue injury and further surgery. We discussed the low dose of radioactivity involved in the procedure. Informed, written consent was given. The usual time-out protocol was performed immediately prior to the procedure. Using ultrasound guidance, sterile technique, 1% lidocaine and an I-125 radioactive seed, the LEFT axillary lymph node containing biopsy clip was localized using a MEDIAL approach. Follow-up single LEFT mammogram image confirm the seed in the expected location  adjacent to the [facility] biopsy clip and were marked for Dr. Cornett. Follow-up survey of the patient confirms presence of the radioactive seed. Order number   of I-125 seed:  [id number]. Total activity:  4.818 millicuries.  Reference Date: 01/12/2022 The patient tolerated the procedure well and was released from the [facility]. He was given instructions regarding seed removal. IMPRESSION: Radioactive seed localization of LEFT axillary lymph node. No apparent complications. Electronically Signed   By: Margarette Canada M.D.   On: 02/20/2022 11:38   Procedures Dr. Brantley Stage (02/21/2022) - Attempted left simple mastectomy with subsequent left modified radical mastectomy, attempted left axillary sentinel lymph node mapping, attempted targeted left axillar lymph node biopsy and port removal  Hospital Course:  GOKU HARB is a 41 y.o. 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 He underwent procedure listed above. Tolerated procedure well and was transferred to the floor.  On POD1 the patient was voiding well, tolerating diet, ambulating well, pain well controlled, vital signs stable, incisions c/d/i and felt stable for discharge home.  Patient will follow up as below and knows to call with questions or concerns.    Physical Exam: General:  Alert, NAD, pleasant, comfortable Pulm: rate and effort normal Chest: right port removal site cdi. Left breast incision cdi without swelling or ecchymosis. JP with serosanguinous fluid in bulb  Allergies as of 02/22/2022       Reactions   Bactrim [sulfamethoxazole-trimethoprim] Other (See Comments)   Blood Capillaries Rupture         Medication List     TAKE these medications    acetaminophen 500 MG tablet Commonly known as: TYLENOL Take 1,500 mg by mouth daily as needed for moderate pain or headache.   atorvastatin 20 MG tablet Commonly known  as: LIPITOR Take 1 tablet (20 mg total) by mouth daily. What changed: when to take this   dapagliflozin propanediol 10 MG Tabs tablet Commonly known as: FARXIGA Take 1 tablet by mouth daily. What changed: when to take this   dexamethasone 4 MG tablet Commonly known as: DECADRON Take 1 tablet (4 mg total) by mouth daily. Take 1 tablet day before chemo and 1 tablet day after chemo with food   digoxin 0.125 MG tablet Commonly known as: LANOXIN TAKE 1 TABLET BY MOUTH ONCE DAILY What changed: when to take this   Entresto 49-51 MG Generic drug: sacubitril-valsartan Take 1 tablet by mouth 2 times daily.   eplerenone 50 MG tablet Commonly known as: INSPRA Take 1 tablet by mouth daily. What changed: when to take this   lidocaine-prilocaine cream Commonly known as: EMLA Apply to affected area once daily as directed   LORazepam 0.5 MG tablet Commonly known as: Ativan Take 1 tablet (0.5 mg total) by mouth every 6 (six) hours as needed (Nausea or vomiting).   magic mouthwash w/lidocaine Soln Take 5 mLs by mouth 3 (three) times daily as needed for mouth pain.   methocarbamol 750 MG tablet Commonly known as: Robaxin-750 Take 1 tablet (750 mg total) by mouth every 8 (eight) hours as needed for muscle spasms.   metoprolol succinate 50 MG 24 hr tablet Commonly known as: TOPROL-XL Take 1 tablet (50 mg total) by mouth daily. What changed: when to take this   ondansetron 8 MG tablet Commonly known as: Zofran Take 1 tablet (8 mg total) by mouth 2 (two) times daily as needed for refractory nausea / vomiting. Start on day 3 after chemo.   oxyCODONE 5 MG immediate release tablet Commonly known as: Roxicodone Take 1 tablet (5 mg  total) by mouth every 8 (eight) hours as needed.   prochlorperazine 10 MG tablet Commonly known as: COMPAZINE Take 1 tablet (10 mg total) by mouth every 6 (six) hours as needed (Nausea or vomiting).          Follow-up Information     Cornett, Thomas,  MD. Call.   Specialty: General Surgery Contact information: [address] [place] [address] [phone]                  Signed: Brooke A Meuth, PA-C [facility] 02/22/2022, 8:53 AM Please see Amion for pager number during day hours 7:00am-4:30pm  

## 2022-02-22 NOTE — Progress Notes (Signed)
Michael Stuart to be D/C'd per MD order. Discussed with the patient and all questions fully answered. ? VSS, Skin clean, dry and intact without evidence of skin break down, no evidence of skin tears noted. ? IV catheter discontinued intact. Site without signs and symptoms of complications. Dressing and pressure applied. ? An After Visit Summary was printed and given to the patient. Patient informed where to pickup prescriptions. ? D/c education completed with patient/family including follow up instructions, medication list, d/c activities limitations if indicated, with other d/c instructions as indicated by MD - patient able to verbalize understanding, all questions fully answered.  ? Patient instructed to return to ED, call 911, or call MD for any changes in condition.  ? Patient to be escorted via [place], and D/C home via private auto.

## 2022-02-26 ENCOUNTER — Encounter: Payer: Self-pay | Admitting: Surgery

## 2022-02-27 ENCOUNTER — Encounter: Payer: Self-pay | Admitting: *Deleted

## 2022-02-27 DIAGNOSIS — C50422 Malignant neoplasm of upper-outer quadrant of left male breast: Secondary | ICD-10-CM

## 2022-03-05 ENCOUNTER — Encounter: Payer: Self-pay | Admitting: *Deleted

## 2022-03-06 NOTE — Progress Notes (Signed)
Radiation Oncology         [phone] ________________________________  Name: Michael Stuart        MRN: [id number]  Date of Service: 03/08/2022 DOB: 04/03/1981  CC:Pcp, No  Gudena, Vinay, MD     REFERRING PHYSICIAN: Gudena, Vinay, MD   DIAGNOSIS: The encounter diagnosis was 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, estrogen receptor positive (HCC).   HISTORY OF PRESENT ILLNESS: Michael Stuart is a 41 y.o. male seen at the request of Dr. Gudena for a diagnosis of left-sided node positive breast cancer in a male.  The patient originally noted a palpable mass in the left breast for several months and a CT scan in the emergency department in February 2023 showed 3.8 cm mass in the left breast with abnormal appearing left axillary lymph nodes.  By ultrasound this measured 4 cm in the breast and 5 abnormal nodes were present.  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  His node was positive as well that was sampled.  He proceeded with neoadjuvant chemotherapy beginning in March 2023 and his most recent treatment was completed on 01/16/2022.  On 02/21/2022 he proceeded with left mastectomy that showed residual disease the tumor bed measuring up to 3.5 cm and 2 of 2 lymph nodes were positive for disease.  He is seen to discuss adjuvant radiotherapy.    PREVIOUS RADIATION THERAPY: {EXAM; YES/NO:19492::"No"}   PAST MEDICAL HISTORY:  Past Medical History:  Diagnosis Date   CHF (congestive heart failure) (HCC)    Myocardial infarction (HCC)    Pneumonia        PAST SURGICAL HISTORY: Past Surgical History:  Procedure Laterality Date   IR IMAGING GUIDED PORT INSERTION  09/28/2021   MASTECTOMY W/ SENTINEL NODE BIOPSY Left 02/21/2022   Procedure: LEFT SIMPLE MASTECTOMY WITH LEFT TARGETED LYMPH NODE BIOPSY;  Surgeon: Cornett, Thomas, MD;  Location: [facility] OR;  Service: General;  Laterality: Left;  GEN  w/ PEC BLOCK   PORT-A-CATH REMOVAL Right 02/21/2022   Procedure: PORT REMOVAL;  Surgeon: Cornett, Thomas, MD;  Location: [facility] OR;  Service: General;  Laterality: Right;   RIGHT/LEFT HEART CATH AND CORONARY ANGIOGRAPHY N/A 08/28/2021   Procedure: RIGHT/LEFT HEART CATH AND CORONARY ANGIOGRAPHY;  Surgeon: Patwardhan, Manish J, MD;  Location: [facility] INVASIVE CV LAB;  Service: Cardiovascular;  Laterality: N/A;     FAMILY HISTORY:  Family History  Problem Relation Age of Onset   Healthy Mother      SOCIAL HISTORY:  reports that he quit smoking about 19 months ago. His smoking use included cigarettes. He has a 6.25 pack-year smoking history. He uses smokeless tobacco. He reports current alcohol use of about 21.0 standard drinks of alcohol per week. He reports that he does not currently use drugs after having used the following drugs: Marijuana. Frequency: 1.00 time per week.   ALLERGIES: Bactrim [sulfamethoxazole-trimethoprim]   MEDICATIONS:  Current Outpatient Medications  Medication Sig Dispense Refill   acetaminophen (TYLENOL) 500 MG tablet Take 1,500 mg by mouth daily as needed for moderate pain or headache.     atorvastatin (LIPITOR) 20 MG tablet Take 1 tablet (20 mg total) by mouth daily. (Patient taking differently: Take 20 mg by mouth in the morning.) 30 tablet 3   dapagliflozin propanediol (FARXIGA) 10 MG TABS tablet Take 1 tablet by mouth daily. (Patient taking differently: Take 10 mg by mouth in the morning.)   90 tablet 2   dexamethasone (DECADRON) 4 MG tablet Take 1 tablet (4 mg total) by mouth daily. Take 1 tablet day before chemo and 1 tablet day after chemo with food (Patient not taking: Reported on 02/13/2022) 12 tablet 0   digoxin (LANOXIN) 0.125 MG tablet TAKE 1 TABLET BY MOUTH ONCE DAILY (Patient taking differently: Take 0.125 mg by mouth in the morning.) 30 tablet 2   eplerenone (INSPRA) 50 MG tablet Take 1 tablet by mouth daily. (Patient taking differently: Take 50 mg by mouth in the  morning.) 90 tablet 3   lidocaine-prilocaine (EMLA) cream Apply to affected area once daily as directed (Patient not taking: Reported on 02/13/2022) 30 g 2   LORazepam (ATIVAN) 0.5 MG tablet Take 1 tablet (0.5 mg total) by mouth every 6 (six) hours as needed (Nausea or vomiting). (Patient not taking: Reported on 02/13/2022) 30 tablet 0   magic mouthwash w/lidocaine SOLN Take 5 mLs by mouth 3 (three) times daily as needed for mouth pain. (Patient not taking: Reported on 02/13/2022) 120 mL 0   methocarbamol (ROBAXIN-750) 750 MG tablet Take 1 tablet (750 mg total) by mouth every 8 (eight) hours as needed for muscle spasms. 15 tablet 1   metoprolol succinate (TOPROL-XL) 50 MG 24 hr tablet Take 1 tablet (50 mg total) by mouth daily. (Patient taking differently: Take 50 mg by mouth in the morning.) 90 tablet 3   ondansetron (ZOFRAN) 8 MG tablet Take 1 tablet (8 mg total) by mouth 2 (two) times daily as needed for refractory nausea / vomiting. Start on day 3 after chemo. (Patient not taking: Reported on 02/13/2022) 30 tablet 0   oxyCODONE (ROXICODONE) 5 MG immediate release tablet Take 1 tablet (5 mg total) by mouth every 8 (eight) hours as needed. 20 tablet 0   prochlorperazine (COMPAZINE) 10 MG tablet Take 1 tablet (10 mg total) by mouth every 6 (six) hours as needed (Nausea or vomiting). (Patient not taking: Reported on 02/13/2022) 30 tablet 0   sacubitril-valsartan (ENTRESTO) 49-51 MG Take 1 tablet by mouth 2 times daily. 60 tablet 3   No current facility-administered medications for this visit.     REVIEW OF SYSTEMS: On review of systems, the patient reports that *** is doing well overall. *** denies any chest pain, shortness of breath, cough, fevers, chills, night sweats, unintended weight changes. *** denies any bowel or bladder disturbances, and denies abdominal pain, nausea or vomiting. *** denies any new musculoskeletal or joint aches or pains. A complete review of systems is obtained and is otherwise  negative.     PHYSICAL EXAM:  Wt Readings from Last 3 Encounters:  02/21/22 269 lb (122 kg)  02/13/22 269 lb 11.2 oz (122.3 kg)  01/16/22 269 lb 12.8 oz (122.4 kg)   Temp Readings from Last 3 Encounters:  02/22/22 97.9 F (36.6 C) (Oral)  02/13/22 98.2 F (36.8 C) (Oral)  01/18/22 98.7 F (37.1 C) (Oral)   BP Readings from Last 3 Encounters:  02/22/22 (!) 157/86  02/13/22 (!) 124/100  01/18/22 140/80   Pulse Readings from Last 3 Encounters:  02/22/22 93  02/13/22 93  01/18/22 88    /10  In general this is a well appearing *** in no acute distress. ***'s alert and oriented x4 and appropriate throughout the examination. Cardiopulmonary assessment is negative for acute distress and *** exhibits normal effort.     ECOG = ***  0 - Asymptomatic (Fully active, able to carry on all predisease activities without   restriction)  1 - Symptomatic but completely ambulatory (Restricted in physically strenuous activity but ambulatory and able to carry out work of a light or sedentary nature. For example, light housework, office work)  2 - Symptomatic, <50% in bed during the day (Ambulatory and capable of all self care but unable to carry out any work activities. Up and about more than 50% of waking hours)  3 - Symptomatic, >50% in bed, but not bedbound (Capable of only limited self-care, confined to bed or chair 50% or more of waking hours)  4 - Bedbound (Completely disabled. Cannot carry on any self-care. Totally confined to bed or chair)  5 - Death   Eustace Pen MM, Creech RH, Tormey DC, et al. [phone]). "Toxicity and response criteria of the [facility] [employer]". [place] Oncol. 5 (6): 649-55    LABORATORY DATA:  Lab Results  Component Value Date   WBC 15.3 (H) 02/22/2022   HGB 13.9 02/22/2022   HCT 41.6 02/22/2022   MCV 99.0 02/22/2022   PLT 196 02/22/2022   Lab Results  Component Value Date   NA 135 02/22/2022   K 4.2 02/22/2022   CL 103 02/22/2022    CO2 22 02/22/2022   Lab Results  Component Value Date   ALT 20 01/16/2022   AST 21 01/16/2022   ALKPHOS 56 01/16/2022   BILITOT 0.4 01/16/2022      RADIOGRAPHY: MM Breast Surgical Specimen  Result Date: 02/21/2022 CLINICAL DATA:  Status post LEFT axillary lymph node excision today after earlier radioactive seed localization. EXAM: SPECIMEN RADIOGRAPH OF THE LEFT AXILLA COMPARISON:  Previous exam(s). FINDINGS: Status post excision of the LEFT axilla. The radioactive seed is present and appear completely intact within the specimen. The radioactive seed appears to be partially obscuring an underlying biopsy clip. Findings discussed with the OR staff during the procedure. IMPRESSION: Specimen radiograph of the LEFT axilla. Electronically Signed   By: Franki Cabot M.D.   On: 02/21/2022 15:11  [place] LT RADIOACTIVE SEED LOC  Result Date: 02/20/2022 CLINICAL DATA:  41 year old male for radioactive seed localization of LEFT axillary lymph node metastasis. Patient will undergo LEFT mastectomy for LEFT breast cancer. EXAM: ULTRASOUND GUIDED RADIOACTIVE SEED LOCALIZATION OF THE LEFT AXILLA LEFT MAMMOGRAM POST SEED PLACEMENT COMPARISON:  Previous exam(s). FINDINGS: Patient presents for radioactive seed localization prior to targeted LEFT axillary lymph node excision. I met with the patient and we discussed the procedure of seed localization including benefits and alternatives. We discussed the high likelihood of a successful procedure. We discussed the risks of the procedure including infection, bleeding, tissue injury and further surgery. We discussed the low dose of radioactivity involved in the procedure. Informed, written consent was given. The usual time-out protocol was performed immediately prior to the procedure. Using ultrasound guidance, sterile technique, 1% lidocaine and an I-125 radioactive seed, the LEFT axillary lymph node containing biopsy clip was localized using a MEDIAL approach. Follow-up  single LEFT mammogram image confirm the seed in the expected location adjacent to the [facility] biopsy clip and were marked for Dr. Brantley Stage. Follow-up survey of the patient confirms presence of the radioactive seed. Order number of I-125 seed:  [id number]. Total activity:  5.686 millicuries.  Reference Date: 01/12/2022 The patient tolerated the procedure well and was released from the [place]. He was given instructions regarding seed removal. IMPRESSION: Radioactive seed localization of LEFT axillary lymph node. No apparent complications. Electronically Signed   By: Margarette Canada M.D.   On: 02/20/2022 11:38  MM CLIP PLACEMENT LEFT  Result Date: 02/20/2022 CLINICAL DATA:  41 year old male for radioactive seed localization of LEFT axillary lymph node metastasis. Patient will undergo LEFT mastectomy for LEFT breast cancer. EXAM: ULTRASOUND GUIDED RADIOACTIVE SEED LOCALIZATION OF THE LEFT AXILLA LEFT MAMMOGRAM POST SEED PLACEMENT COMPARISON:  Previous exam(s). FINDINGS: Patient presents for radioactive seed localization prior to targeted LEFT axillary lymph node excision. I met with the patient and we discussed the procedure of seed localization including benefits and alternatives. We discussed the high likelihood of a successful procedure. We discussed the risks of the procedure including infection, bleeding, tissue injury and further surgery. We discussed the low dose of radioactivity involved in the procedure. Informed, written consent was given. The usual time-out protocol was performed immediately prior to the procedure. Using ultrasound guidance, sterile technique, 1% lidocaine and an I-125 radioactive seed, the LEFT axillary lymph node containing biopsy clip was localized using a MEDIAL approach. Follow-up single LEFT mammogram image confirm the seed in the expected location adjacent to the [facility] biopsy clip and were marked for Dr. Brantley Stage. Follow-up survey of the patient confirms presence of the  radioactive seed. Order number of I-125 seed:  [id number]. Total activity:  6.568 millicuries.  Reference Date: 01/12/2022 The patient tolerated the procedure well and was released from the [facility]. He was given instructions regarding seed removal. IMPRESSION: Radioactive seed localization of LEFT axillary lymph node. No apparent complications. Electronically Signed   By: Margarette Canada M.D.   On: 02/20/2022 11:38      IMPRESSION/PLAN: 1. Stage IIA, cT2N1M0 grade 2, ER/PR positive invasive ductal carcinoma of the left breast. Dr. Lisbeth Renshaw discusses the pathology findings and reviews the nature of *** breast disease.  The patient has done well with completing neoadjuvant chemotherapy.  He is healing from surgery***.  Dr. Lisbeth Renshaw discusses the rationale for adjuvant radiotherapy to the left chest wall and regional lymph nodes on the left side to reduce risks of local recurrence followed by antiestrogen therapy. We discussed the risks, benefits, short, and long term effects of radiotherapy, as well as the curative intent, and the patient is interested in proceeding. Dr. Lisbeth Renshaw discusses the delivery and logistics of radiotherapy and anticipates a course of 6-1/2 weeks of radiotherapy to the left chest wall and regional nodes with the inspiration breath-hold technique. Written consent is obtained and placed in the chart, a copy was provided to the patient. The patient will be contacted to coordinate treatment planning by our simulation department.  We anticipate starting his treatment the week of 03/26/2022.   In a visit lasting *** minutes, greater than 50% of the time was spent face to face discussing the patient's condition, in preparation for the discussion, and coordinating the patient's care.   The above documentation reflects my direct findings during this shared patient visit. Please see the separate note by Dr. Lisbeth Renshaw on this date for the remainder of the patient's plan of care.    Carola Rhine,  [facility]   **Disclaimer: This note was dictated with voice recognition software. Similar sounding words can inadvertently be transcribed and this note may contain transcription errors which may not have been corrected upon publication of note.**

## 2022-03-06 NOTE — Progress Notes (Signed)
New Breast Cancer Diagnosis: Left Breast UOQ  Did patient present with symptoms (if so, please note symptoms) or screening mammography?:Palpable mass noted in the left breast for 8 months.   Location and Extent of disease :left breast. Located in the Upper Outer Quadrant, measured 3.8 cm in greatest dimension. Adenopathy yes.  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 Brantley Stage -Left Simple mastectomy with left targeted lymph node biopsy 02/21/2022 -Follow-up 03/09/2022, >30 cc drainage from drains.   Medical oncologist, treatment if any:   Dr. Lindi Adie Treatment plan: 1. Neoadjuvant chemotherapy with Taxotere and Cytoxan every 3 weeks x6 cycles-Completed 01/16/2022 2. Followed by breast conserving surgery with targeted axillary dissection vs ALND- 02/21/2022 3. Followed by adjuvant radiation therapy 4.  Followed by adjuvant antiestrogen therapy with CDK 4 and 6 inhibitor -CT CAP and bone scan: No distant metastatic disease   Family History of Breast/Ovarian/Prostate Cancer: No  Lymphedema issues, if any: No     Pain issues, if any:  He reports pain at surgical site.  His drains remain in place with drainage >30 cc.   SAFETY ISSUES: Prior radiation? No Pacemaker/ICD? No Possible current pregnancy? N/a Is the patient on methotrexate? 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

## 2022-03-07 NOTE — Progress Notes (Signed)
Patient Care Team: Pcp, No as PCP - General Mauro Kaufmann, RN as Oncology Nurse Navigator Rockwell Germany, RN as Oncology Nurse Navigator Elease Hashimoto (Neurology)  DIAGNOSIS: No diagnosis found.  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 CT 08/27/2021 in ED showed 3.8 centimeter left breast mass with left axillary lymph nodes.Mamm and [place]: 4 cm mass with 5 axillary lymph nodes: Biopsy: Grade 2 IDC, lymph node: Positive, ER 100%, PR 50%, Ki-67 25%, HER2 negative ratio 1.25, copy #3.45   09/25/2021 Cancer Staging   Staging form: Breast, AJCC 8th Edition - Clinical: Stage IIA (cT2, cN1, cM0, G2, ER+, PR+, HER2-) - Signed by Nicholas Lose, MD on 09/25/2021 Histologic grading system: 3 grade system   10/03/2021 -  Chemotherapy   Patient is on Treatment Plan : BREAST TC q21d     01/22/2022 Genetic Testing   Negative genetic testing on the CancerNext-Expanded+RNAinsight.  The report date is January 22, 2022.  The CancerNext-Expanded gene panel offered by [facility] and includes sequencing and rearrangement analysis for the following 77 genes: AIP, ALK, APC*, ATM*, AXIN2, BAP1, BARD1, BLM, BMPR1A, BRCA1*, BRCA2*, BRIP1*, CDC73, CDH1*, CDK4, CDKN1B, CDKN2A, CHEK2*, CTNNA1, DICER1, FANCC, FH, FLCN, GALNT12, KIF1B, LZTR1, MAX, MEN1, MET, MLH1*, MSH2*, MSH3, MSH6*, MUTYH*, NBN, NF1*, NF2, NTHL1, PALB2*, PHOX2B, PMS2*, POT1, PRKAR1A, PTCH1, PTEN*, RAD51C*, RAD51D*, RB1, RECQL, RET, SDHA, SDHAF2, SDHB, SDHC, SDHD, SMAD4, SMARCA4, SMARCB1, SMARCE1, STK11, SUFU, TMEM127, TP53*, TSC1, TSC2, VHL and XRCC2 (sequencing and deletion/duplication); EGFR, EGLN1, HOXB13, KIT, MITF, PDGFRA, POLD1, and POLE (sequencing only); EPCAM and GREM1 (deletion/duplication only). DNA and RNA analyses performed for * genes.      CHIEF  COMPLIANT: Follow up post op left breast mass.  INTERVAL HISTORY: Michael Stuart is a 41 y.o male with the above mention. He presents to the clinic today for a follow-up to discuss pathology report.   ALLERGIES:  is allergic to bactrim [sulfamethoxazole-trimethoprim].  MEDICATIONS:  Current Outpatient Medications  Medication Sig Dispense Refill   acetaminophen (TYLENOL) 500 MG tablet Take 1,500 mg by mouth daily as needed for moderate pain or headache.     atorvastatin (LIPITOR) 20 MG tablet Take 1 tablet (20 mg total) by mouth daily. (Patient taking differently: Take 20 mg by mouth in the morning.) 30 tablet 3   dapagliflozin propanediol (FARXIGA) 10 MG TABS tablet Take 1 tablet by mouth daily. (Patient taking differently: Take 10 mg by mouth in the morning.) 90 tablet 2   dexamethasone (DECADRON) 4 MG tablet Take 1 tablet (4 mg total) by mouth daily. Take 1 tablet day before chemo and 1 tablet day after chemo with food (Patient not taking: Reported on 02/13/2022) 12 tablet 0   digoxin (LANOXIN) 0.125 MG tablet TAKE 1 TABLET BY MOUTH ONCE DAILY (Patient taking differently: Take 0.125 mg by mouth in the morning.) 30 tablet 2   eplerenone (INSPRA) 50 MG tablet Take 1 tablet by mouth daily. (Patient taking differently: Take 50 mg by mouth in the morning.) 90 tablet 3   lidocaine-prilocaine (EMLA) cream Apply to affected area once daily as directed (Patient not taking: Reported on 02/13/2022) 30 g 2   LORazepam (ATIVAN) 0.5 MG tablet Take 1 tablet (0.5 mg total) by mouth every 6 (six) hours as needed (Nausea or vomiting). (Patient not taking: Reported on 02/13/2022) 30 tablet 0  magic mouthwash w/lidocaine SOLN Take 5 mLs by mouth 3 (three) times daily as needed for mouth pain. (Patient not taking: Reported on 02/13/2022) 120 mL 0   methocarbamol (ROBAXIN-750) 750 MG tablet Take 1 tablet (750 mg total) by mouth every 8 (eight) hours as needed for muscle spasms. 15 tablet 1   metoprolol succinate (TOPROL-XL)  50 MG 24 hr tablet Take 1 tablet (50 mg total) by mouth daily. (Patient taking differently: Take 50 mg by mouth in the morning.) 90 tablet 3   ondansetron (ZOFRAN) 8 MG tablet Take 1 tablet (8 mg total) by mouth 2 (two) times daily as needed for refractory nausea / vomiting. Start on day 3 after chemo. (Patient not taking: Reported on 02/13/2022) 30 tablet 0   oxyCODONE (ROXICODONE) 5 MG immediate release tablet Take 1 tablet (5 mg total) by mouth every 8 (eight) hours as needed. 20 tablet 0   prochlorperazine (COMPAZINE) 10 MG tablet Take 1 tablet (10 mg total) by mouth every 6 (six) hours as needed (Nausea or vomiting). (Patient not taking: Reported on 02/13/2022) 30 tablet 0   sacubitril-valsartan (ENTRESTO) 49-51 MG Take 1 tablet by mouth 2 times daily. 60 tablet 3   No current facility-administered medications for this visit.    PHYSICAL EXAMINATION: ECOG PERFORMANCE STATUS: {CHL ONC ECOG PS:[phone]}  There were no vitals filed for this visit. There were no vitals filed for this visit.  BREAST:*** No palpable masses or nodules in either right or left breasts. No palpable axillary supraclavicular or infraclavicular adenopathy no breast tenderness or nipple discharge. (exam performed in the presence of a chaperone)  LABORATORY DATA:  I have reviewed the data as listed    Latest Ref Rng & Units 02/22/2022    1:03 AM 02/13/2022    9:31 AM 01/16/2022   11:26 AM  CMP  Glucose 70 - 99 mg/dL 129  97  96   BUN 6 - 20 mg/dL 14  12  11    Creatinine 0.61 - 1.24 mg/dL 0.82  0.82  0.81   Sodium 135 - 145 mmol/L 135  138  139   Potassium 3.5 - 5.1 mmol/L 4.2  4.3  3.8   Chloride 98 - 111 mmol/L 103  105  107   CO2 22 - 32 mmol/L 22  23  25    Calcium 8.9 - 10.3 mg/dL 8.7  9.3  9.3   Total Protein 6.5 - 8.1 g/dL   7.1   Total Bilirubin 0.3 - 1.2 mg/dL   0.4   Alkaline Phos 38 - 126 U/L   56   AST 15 - 41 U/L   21   ALT 0 - 44 U/L   20     Lab Results  Component Value Date   WBC 15.3 (H)  02/22/2022   HGB 13.9 02/22/2022   HCT 41.6 02/22/2022   MCV 99.0 02/22/2022   PLT 196 02/22/2022   NEUTROABS 5.0 01/16/2022    ASSESSMENT & PLAN:  No problem-specific Assessment & Plan notes found for this encounter.    No orders of the defined types were placed in this encounter.  The patient has a good understanding of the overall plan. he agrees with it. he will call with any problems that may develop before the next visit here. Total time spent: 30 mins including face to face time and time spent for planning, charting and co-ordination of care   Suzzette Righter, [place] 03/07/22    I Jenniah Bhavsar, Hakeen Shipes am scribing  for Dr. Lindi Adie  ***

## 2022-03-08 ENCOUNTER — Ambulatory Visit
Admission: RE | Admit: 2022-03-08 | Discharge: 2022-03-08 | Disposition: A | Discharge status: Home / Self Care | Payer: BC Managed Care – PPO | Source: Ambulatory Visit | Attending: Radiation Oncology | Admitting: Radiation Oncology

## 2022-03-08 ENCOUNTER — Other Ambulatory Visit: Payer: Self-pay

## 2022-03-08 ENCOUNTER — Inpatient Hospital Stay: Payer: BC Managed Care – PPO | Attending: Hematology and Oncology | Admitting: Hematology and Oncology

## 2022-03-08 ENCOUNTER — Encounter: Payer: Self-pay | Admitting: Radiation Oncology

## 2022-03-08 VITALS — BP 124/85 | HR 116 | Temp 97.5°F | Resp 20 | Ht 75.0 in | Wt 267.8 lb

## 2022-03-08 DIAGNOSIS — Z87891 Personal history of nicotine dependence: Secondary | ICD-10-CM | POA: Insufficient documentation

## 2022-03-08 DIAGNOSIS — C773 Secondary and unspecified malignant neoplasm of axilla and upper limb lymph nodes: Secondary | ICD-10-CM | POA: Insufficient documentation

## 2022-03-08 DIAGNOSIS — Z79899 Other long term (current) drug therapy: Secondary | ICD-10-CM | POA: Insufficient documentation

## 2022-03-08 DIAGNOSIS — Z170 Estrogen receptor positive status [ER+]: Secondary | ICD-10-CM | POA: Insufficient documentation

## 2022-03-08 DIAGNOSIS — Z7984 Long term (current) use of oral hypoglycemic drugs: Secondary | ICD-10-CM | POA: Insufficient documentation

## 2022-03-08 DIAGNOSIS — G620 Drug-induced polyneuropathy: Secondary | ICD-10-CM | POA: Diagnosis not present

## 2022-03-08 DIAGNOSIS — C50422 Malignant neoplasm of upper-outer quadrant of left male breast: Secondary | ICD-10-CM | POA: Insufficient documentation

## 2022-03-08 DIAGNOSIS — T451X5A Adverse effect of antineoplastic and immunosuppressive drugs, initial encounter: Secondary | ICD-10-CM | POA: Diagnosis not present

## 2022-03-08 DIAGNOSIS — I252 Old myocardial infarction: Secondary | ICD-10-CM | POA: Insufficient documentation

## 2022-03-08 DIAGNOSIS — I509 Heart failure, unspecified: Secondary | ICD-10-CM | POA: Insufficient documentation

## 2022-03-08 DIAGNOSIS — Z9012 Acquired absence of left breast and nipple: Secondary | ICD-10-CM | POA: Insufficient documentation

## 2022-03-08 DIAGNOSIS — Z7952 Long term (current) use of systemic steroids: Secondary | ICD-10-CM | POA: Diagnosis not present

## 2022-03-08 MED ORDER — AMOXICILLIN-POT CLAVULANATE 875-125 MG PO TABS: 1.0000 | ORAL_TABLET | Freq: Two times a day (BID) | ORAL | 0 refills | Status: DC

## 2022-03-08 NOTE — Assessment & Plan Note (Signed)
09/15/2021:Palpable left breast mass x8 months. 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 Neoadjuvant chemotherapy withTaxotere and Cytoxan every 3 weeks x6 cycles completed 01/16/2022 2. left mastectomy: Residual grade 2 IDC 3.5 cm, margins negative, 2/2 lymph nodes positive, angiolymphatic invasion present, ER 100%, PR 50%, HER2 negative, Ki-67 25% 3. Followed by adjuvant radiation therapy 4.Followed by adjuvant antiestrogen therapy with CDK 4 and 6 inhibitor CT CAP and bone scan: No distant metastatic disease ------------------------------------------------------------------------------------------------------------------------------------------ Pathology counseling: I discussed the final pathology report of the patient provided  a copy of this report. I discussed the margins as well as lymph node surgeries. We also discussed the final staging along with previously performed ER/PR and HER-2/neu testing.  Return to clinic after radiation is complete.

## 2022-03-09 ENCOUNTER — Encounter (HOSPITAL_COMMUNITY): Payer: Self-pay | Admitting: Cardiology

## 2022-03-09 ENCOUNTER — Ambulatory Visit (HOSPITAL_COMMUNITY)
Admission: RE | Admit: 2022-03-09 | Discharge: 2022-03-09 | Disposition: A | Discharge status: Home / Self Care | Payer: BC Managed Care – PPO | Source: Ambulatory Visit | Attending: Cardiology | Admitting: Cardiology

## 2022-03-09 ENCOUNTER — Other Ambulatory Visit: Payer: Self-pay | Admitting: Cardiology

## 2022-03-09 ENCOUNTER — Other Ambulatory Visit (HOSPITAL_COMMUNITY): Payer: Self-pay

## 2022-03-09 ENCOUNTER — Encounter: Payer: Self-pay | Admitting: Hematology and Oncology

## 2022-03-09 ENCOUNTER — Telehealth (HOSPITAL_COMMUNITY): Payer: Self-pay | Admitting: Pharmacy Technician

## 2022-03-09 VITALS — BP 120/78 | HR 98 | Wt 267.8 lb

## 2022-03-09 DIAGNOSIS — Z9221 Personal history of antineoplastic chemotherapy: Secondary | ICD-10-CM | POA: Diagnosis not present

## 2022-03-09 DIAGNOSIS — Z9012 Acquired absence of left breast and nipple: Secondary | ICD-10-CM | POA: Insufficient documentation

## 2022-03-09 DIAGNOSIS — Z86718 Personal history of other venous thrombosis and embolism: Secondary | ICD-10-CM | POA: Insufficient documentation

## 2022-03-09 DIAGNOSIS — C50422 Malignant neoplasm of upper-outer quadrant of left male breast: Secondary | ICD-10-CM | POA: Diagnosis not present

## 2022-03-09 DIAGNOSIS — I5022 Chronic systolic (congestive) heart failure: Secondary | ICD-10-CM | POA: Insufficient documentation

## 2022-03-09 DIAGNOSIS — Z87891 Personal history of nicotine dependence: Secondary | ICD-10-CM | POA: Insufficient documentation

## 2022-03-09 DIAGNOSIS — Z7901 Long term (current) use of anticoagulants: Secondary | ICD-10-CM | POA: Insufficient documentation

## 2022-03-09 DIAGNOSIS — Z8673 Personal history of transient ischemic attack (TIA), and cerebral infarction without residual deficits: Secondary | ICD-10-CM | POA: Insufficient documentation

## 2022-03-09 DIAGNOSIS — Z7984 Long term (current) use of oral hypoglycemic drugs: Secondary | ICD-10-CM | POA: Diagnosis not present

## 2022-03-09 DIAGNOSIS — C50922 Malignant neoplasm of unspecified site of left male breast: Secondary | ICD-10-CM | POA: Insufficient documentation

## 2022-03-09 DIAGNOSIS — Z79899 Other long term (current) drug therapy: Secondary | ICD-10-CM | POA: Diagnosis not present

## 2022-03-09 DIAGNOSIS — Z170 Estrogen receptor positive status [ER+]: Secondary | ICD-10-CM | POA: Diagnosis not present

## 2022-03-09 NOTE — Patient Instructions (Signed)
STOP Digoxin  Your physician has requested that you have an echocardiogram. Echocardiography is a painless test that uses sound waves to create images of your heart. It provides your doctor with information about the size and shape of your heart and how well your heart's chambers and valves are working. This procedure takes approximately one hour. There are no restrictions for this procedure.   Your physician recommends that you schedule a follow-up appointment in: 3 months  If you have any questions or concerns before your next appointment please send [place] a message through [place] or call our office at [phone].    TO LEAVE A MESSAGE FOR THE NURSE SELECT OPTION 2, PLEASE LEAVE A MESSAGE INCLUDING: YOUR NAME DATE OF BIRTH CALL BACK NUMBER REASON FOR CALL**this is important as we prioritize the call backs  YOU WILL RECEIVE A CALL BACK THE SAME DAY AS LONG AS YOU CALL BEFORE 4:00 PM  At the [facility], you and your health needs are our priority. As part of our continuing mission to provide you with exceptional heart care, we have created designated Provider Care Teams. These Care Teams include your primary Cardiologist (physician) and Advanced Practice Providers (APPs- Physician Assistants and Nurse Practitioners) who all work together to provide you with the care you need, when you need it.   You may see any of the following providers on your designated Care Team at your next follow up: Dr Glori Bickers Dr Loralie Champagne Dr. Roxana Hires, NP Lyda Jester, [place] [facility] [place], [place] Forestine Na, NP Audry Riles, PharmD   Please be sure to bring in all your medications bottles to every appointment.

## 2022-03-09 NOTE — Telephone Encounter (Signed)
Advanced Heart Failure Patient Advocate Encounter  Was consulted to see patient regarding Delene Loll and [place] co-pay. The patient was previously using [facility] outpatient pharmacy and had to get his RXs transferred to [facility], due to insurance requirements. His co-pays have been high, despite switching pharmacies. Upon further review, the patient was using co-pay cards before the RXs were transferred. Doesn't look like the co-pay cards were not sent with the scripts. The information is as reads below and was provided to the patient.   ENTRESTO BIN [id number] PCN OHCP ID [id number] GROUP [id number]  FARXIGA BIN [id number] PCN CN ID [id number] GROUP [id number]  Unfortunately, there are no co-pay cards for Eplerenone. To gain access to patient assistance, the patient would need to be uninsured, or have health insurance with no pharmacy benefits. Patient aware.  Charlann Boxer, CPhT

## 2022-03-10 ENCOUNTER — Other Ambulatory Visit: Payer: Self-pay

## 2022-03-11 NOTE — Progress Notes (Signed)
ADVANCED HF CLINIC NOTE Primary Care: Pcp, No HF Cardiologist: Dr. Aundra Dubin  HPI: Michael Stuart is a 41 y.o. male with minimal previous past medical history, and diagnosis of systolic heart failure in 2/23 with LV thrombus and new diagnosis of breast cancer.  Admitted 5/09 with new systolic heart failure. Echo showed dilated LV with EF 20-25%.  LHC/RHC was done, showing elevated filling pressures but relatively preserved cardiac index of 2.3.  There was no significant coronary disease. cMRI showed LV dilation with EF 10%, LV thrombus, mildly dilated RV with EF 15%.  Delayed enhancement imaging showed inferolateral LGE that could be a OM territory infarct, but corresponding lesion not seen by cath. Most likely acute myocarditis given subepicardial apical inferior LGE also (the RV insertion site LGE can be nonspecific in the setting of volume/pressure overload). Elevated T2 in the apical inferior wall was more suggestive of acute myocarditis. 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 He was started on Coumadin for thrombus. He had transient LOC after MRI and was orthostatic, felt to be vagally-mediated. Also noted to have breast mass and new axillary lymphadenopathy, suspect breast cancer. Patient was diuresed and started on GDMT.  He was discharged with LifeVest with oncology follow up.  Readmitted 1 day after discharge with stroke-like symptoms of new left facial numbness, left facial drop and vision changes. INR 1.5 on admission, patient compliant with Lovenox bridging since discharge. CT head negative, MRI showed patchy acute and subacute infarcts. Neurology consulted and recommended transitioning to [place] given stroke in spite of being on warfarin/Lovenox. Deficits resolved and he was discharged home, weight 254 lbs.  He had left mastectomy for breast cancer in 8/23 and has completed taxotere/Cytoxan chemotherapy.  He will be starting radiation soon.   Echo was done in 5/23, this showed EF 50-55%, mild MR, no  LV thrombus. Anticoagulation was stopped with improvement in LV function.   Today he returns for HF follow up with his mother. He is back at work with [employer].  He is separated from his wife, lives with his mother.  He has some generalized fatigue but no significant exertional dyspnea.  He was able to walk 2 miles this week without stopping, mild dyspnea walking up steep hill.  No problems with ADLs.  No chest pain.  No orthopnea/PND.  No lightheadedness.  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    ECG (personally reviewed): NSR, LAFB  Labs (2/23): K 4.9, creatinine 0.96, hgb 16.2 Labs (4/23): K 4.0, creatinine 0.80, hgb 13.5 Labs (8/23): K 4.2, creatinine 0.82  PMH: 1. 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  s/p left mastectomy in 8/23 with chemotherapy with taxotere/Cytoxan and radiation.  2. 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   - Echo (2/23): dilated LV with EF 20-25%. - R/LHC (2/23): Elevated filling pressures but relatively preserved cardiac index of 2.3.  There was no significant coronary disease.  - cMRI (2/23): LV dilation with LVEF 10%, LV thrombus, mildly dilated RV with RVEF 15%.  Delayed enhancement imaging showed inferolateral LGE that could be a OM territory infarct, but corresponding lesion not seen by cath. Most likely acute myocarditis given subepicardial apical inferior LGE also (the RV insertion site LGE can be nonspecific in the setting of volume/pressure overload). Elevated T2 in the apical inferior wall is more suggestive of acute myocarditis. - Echo (5/23): EF 50-55%, mild MR, no LV thrombus 3. LV thrombus: Resolved on 5/23 echo.  4. CVA: 2/23,  in setting of LV thrombus.     Current Outpatient Medications  Medication Sig Dispense Refill   acetaminophen (TYLENOL) 500 MG tablet Take 1,500 mg by mouth daily as needed for moderate pain or headache.     amoxicillin-clavulanate  (AUGMENTIN) 875-125 MG tablet Take 1 tablet by mouth 2 (two) times daily. 14 tablet 0   dapagliflozin propanediol (FARXIGA) 10 MG TABS tablet Take 1 tablet by mouth daily. 90 tablet 2   eplerenone (INSPRA) 50 MG tablet Take 1 tablet by mouth daily. 90 tablet 3   metoprolol succinate (TOPROL-XL) 50 MG 24 hr tablet Take 1 tablet (50 mg total) by mouth daily. 90 tablet 3   sacubitril-valsartan (ENTRESTO) 49-51 MG Take 1 tablet by mouth 2 times daily. 60 tablet 3   atorvastatin (LIPITOR) 20 MG tablet TAKE 1 TABLET BY MOUTH ONCE DAILY 30 tablet 3   No current facility-administered medications for this encounter.   Allergies  Allergen Reactions   Bactrim [Sulfamethoxazole-Trimethoprim] Other (See Comments)    Blood Capillaries Rupture    Social History   Socioeconomic History   Marital status: Married    Spouse name: Not on file   Number of children: 2   Years of education: Not on file   Highest education level: Bachelor's degree (e.g., BA, AB, BS)  Occupational History   Occupation: crash Insurance underwriter    Employer: [employer]  Tobacco Use   Smoking status: Former    Packs/day: 0.25    Years: 25.00    Total pack years: 6.25    Types: Cigarettes    Quit date: 2022    Years since quitting: 1.6   Smokeless tobacco: Current   Tobacco comments:    Smoked 2PPD x 10 years. Last 13 years 3-4 cigarettes/day.   Vaping Use   Vaping Use: Former   Substances: Nicotine  Substance and Sexual Activity   Alcohol use: Yes    Alcohol/week: 21.0 standard drinks of alcohol    Types: 21 Cans of beer per week    Comment: 2 tall boys per day, everyday x 20years   Drug use: Not Currently    Frequency: 1.0 times per week    Types: Marijuana    Comment: 3x per month   Sexual activity: Not Currently  Other Topics Concern   Not on file  Social History Narrative   Right handed    Social Determinants of Health   Financial Resource Strain: Low Risk  (08/29/2021)   Overall Financial Resource Strain  (CARDIA)    Difficulty of Paying Living Expenses: Not very hard  Food Insecurity: No Food Insecurity (08/29/2021)   Hunger Vital Sign    Worried About Running Out of Food in the Last Year: Never true    Ran Out of Food in the Last Year: Never true  Transportation Needs: No Transportation Needs (08/29/2021)   PRAPARE - Hydrologist (Medical): No    Lack of Transportation (Non-Medical): No  Physical Activity: Not on file  Stress: Not on file  Social Connections: Not on file  Intimate Partner Violence: Not on file   Family History  Problem Relation Age of Onset   Healthy Mother    BP 120/78   Pulse 98   Wt 121.5 kg (267 lb 12.8 oz)   SpO2 97%   BMI 33.47 kg/m   Wt Readings from Last 3 Encounters:  03/09/22 121.5 kg (267 lb 12.8 oz)  03/08/22 121.5 kg (267 lb 12.8 oz)  03/08/22  121.4 kg (267 lb 11.2 oz)   PHYSICAL EXAM: General: NAD Neck: No JVD, no thyromegaly or thyroid nodule.  Lungs: Clear to auscultation bilaterally with normal respiratory effort. CV: Nondisplaced PMI.  Heart regular S1/S2, no S3/S4, no murmur.  No peripheral edema.  No carotid bruit.  Normal pedal pulses.  Abdomen: Soft, nontender, no hepatosplenomegaly, no distention.  Skin: Intact without lesions or rashes.  Neurologic: Alert and oriented x 3.  Psych: Normal affect. Extremities: No clubbing or cyanosis.  HEENT: Normal.   ASSESSMENT & PLAN: 1. Chronic Systolic CHF: Patient presented in 2/23 with CHF after a viral-type illness. HIV, TSH, COVID-19 test, and UDS were all negative. I do not think he was drinking enough ETOH to cause CMP. Patient has no family history of cardiomyopathy.  LHC/RHC showed no significant CAD, elevated filling pressures and relatively preserved cardiac output. Echo in 2/23 showed EF 20-25%. Cardiac MRI in 2/23 showed severe LV dilation with EF 10%, LV thrombus, mildly dilated RV with EF 15%.  Delayed enhancement imaging showed inferolateral LGE that  could be a OM territory infarct, but corresponding lesion not seen by cath. Overall, most likely acute myocarditis given subepicardial apical inferior LGE also (the RV insertion site LGE can be nonspecific in the setting of volume/pressure overload). Elevated T2 in the apical inferior wall was more suggestive of acute myocarditis. 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 Echo in 5/23 showed EF up to 50-55%.  He is not volume overloaded on exam today, NYHA class I-II. Recent BMET was stable.  - Will arrange repeat echo at 1 year in 5/24.  I recommended that he stop digoxin today but continue other meds until repeat echo.  Would likely keep him on at least low doses of beta blocker/Entresto long-term as long as he tolerates.  - Continue Entresto 49/51 bid.  - Continue Toprol XL 50 mg daily.   - Continue Farxiga 10 mg daily. - Continue eplerenone 50 mg daily.  2. LV thrombus: With recovery of EF, anticoagulation was stopped.  3. 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  Transitioned to Eliquis.  Anticoagulation stopped after 5/23 echo showed improved LV function.  4. Breast Cancer: Left breast CA, s/p mastectomy in 8/23.  He has completed course of taxotere and cyotoxan. He will be getting radiation.    Followup 3 months.   Loralie Champagne. 03/11/2022

## 2022-03-14 ENCOUNTER — Encounter (HOSPITAL_COMMUNITY): Payer: Self-pay

## 2022-03-15 ENCOUNTER — Encounter: Payer: Self-pay | Admitting: *Deleted

## 2022-03-15 LAB — SURGICAL PATHOLOGY

## 2022-03-18 NOTE — Therapy (Deleted)
OUTPATIENT PHYSICAL THERAPY BREAST CANCER POST OP FOLLOW UP   Patient Name: Michael Stuart MRN: [id number] DOB:[birth year], 41 y.o., male Today's Date: 03/18/2022    Past Medical History:  Diagnosis Date   CHF (congestive heart failure) ([place])    Myocardial infarction ([place])    Pneumonia    Past Surgical History:  Procedure Laterality Date   IR IMAGING GUIDED PORT INSERTION  09/28/2021   MASTECTOMY W/ SENTINEL NODE BIOPSY Left 02/21/2022   Procedure: LEFT SIMPLE MASTECTOMY WITH LEFT TARGETED LYMPH NODE BIOPSY;  Surgeon: Erroll Luna, MD;  Location: [facility];  Service: General;  Laterality: Left;  GEN w/ PEC BLOCK   PORT-A-CATH REMOVAL Right 02/21/2022   Procedure: PORT REMOVAL;  Surgeon: Erroll Luna, MD;  Location: [facility];  Service: General;  Laterality: Right;   RIGHT/LEFT HEART CATH AND CORONARY ANGIOGRAPHY N/A 08/28/2021   Procedure: RIGHT/LEFT HEART CATH AND CORONARY ANGIOGRAPHY;  Surgeon: Nigel Mormon, MD;  Location: [facility];  Service: Cardiovascular;  Laterality: N/A;   Patient Active Problem List   Diagnosis Date Noted   Breast cancer, stage 3, left ([place]) 02/21/2022   Genetic testing 01/22/2022   LV (left ventricular) mural thrombus 12/13/2021   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, estrogen receptor positive ([place]) 12/13/2021   Port-A-Cath in place 10/03/2021   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 He then had a left mastectomy on 02/21/2022 with 2 positive axillary nodes removed. He will undergo  radiation.  PERTINENT HISTORY:  Pt palpated a left breast mass at 1 oclock for 1 year.  He had a severe upper respiratory infection and subsequently he was not feeling well.  He was diagnosed with acute congestive heart failure with an EF of 22%.  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  He was discharged home and then suddenly felt numbness on his face and on further evaluation was found to have a stroke. CT scans done in the emergency room on 08/27/2021 revealed 3.8 cm left breast mass with enlarged left axillary lymph nodes.  Mammogram confirmed that the mass to be 4 cm with 5 enlarged lymph nodes.  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 Patient reports he underwent neoadjuvant chemotherapy which he completed on 01/16/2022. He then had a left mastectomy on 02/21/2022 with 2 positive axillary nodes removed.   PATIENT GOALS:  Reassess how my recovery is going related to arm function, pain, and  swelling.  PAIN:  Are you having pain? {OPRCPAIN:27236}  PRECAUTIONS: Recent Surgery, left UE Lymphedema risk, {Therapy precautions:24002}  ACTIVITY LEVEL / LEISURE: ***   OBJECTIVE:   PATIENT SURVEYS:  QUICK DASH: ***  OBSERVATIONS:  ***  POSTURE:  ***  LYMPHEDEMA ASSESSMENT:   UPPER EXTREMITY AROM/PROM:   A/PROM RIGHT  10/02/2021    Shoulder extension 82  Shoulder flexion 180  Shoulder abduction 180  Shoulder internal rotation 53  Shoulder external rotation 100                          (Blank rows = not tested)   A/PROM LEFT  10/02/2021 LEFT 03/19/2022  Shoulder extension 75   Shoulder flexion 181   Shoulder abduction 184   Shoulder internal rotation 59   Shoulder external rotation 85                           (Blank rows = not tested)     CERVICAL AROM: All within normal limits:      Percent limited  Flexion WFL  Extension WFL  Right lateral flexion WFL  Left lateral flexion WFL  Right rotation WFL  Left rotation WFL        UPPER EXTREMITY STRENGTH: WFL     LYMPHEDEMA ASSESSMENTS:    LANDMARK RIGHT  10/02/2021 RIGHT 03/19/2022  10 cm proximal to olecranon process 34.6   Olecranon process 29.6   10 cm proximal to ulnar styloid process 26.5   Just proximal to ulnar styloid process 18.5   Across hand at thumb web space 24.7   At base of 2nd digit 7   (Blank rows = not tested)   LANDMARK LEFT  10/02/2021 LEFT 03/19/2022  10 cm proximal to olecranon process 35.7   Olecranon process 30.9   10 cm proximal to ulnar styloid process 25.5   Just proximal to ulnar styloid process 17.9   Across hand at thumb web space 24   At base of 2nd digit 7   (Blank rows = not tested)      Surgery type/Date: 02/21/2022 left mastectomy and sentinel node biopsy Number of lymph nodes removed: *** Current/past treatment (chemo, radiation, hormone therapy): neoadjuvant chemotherapy Other symptoms:  Heaviness/tightness {yes/no:20286} Pain {yes/no:20286}  Pitting  edema {yes/no:20286} Infections {yes/no:20286} Decreased scar mobility {yes/no:20286} Stemmer sign {yes/no:20286}   PATIENT EDUCATION:  Education details: *** Person educated: {Person educated:25204} Education method: {Education Method:25205} Education comprehension: {Education Comprehension:25206}   HOME EXERCISE PROGRAM:  Reviewed previously given post op HEP. ***  ASSESSMENT:  CLINICAL IMPRESSION: ***  Pt will benefit from skilled therapeutic intervention to improve on the following deficits: Decreased knowledge of precautions, impaired UE functional use, pain, decreased ROM, postural dysfunction.   PT treatment/interventions: ADL/Self care home management, {rehab planned interventions:25118::"Therapeutic exercises","Therapeutic activity","Neuromuscular re-education","Balance training","Gait training","Patient/Family education","Self Care","Joint mobilization"}     GOALS: Goals reviewed with patient? {yes/no:20286}  LONG TERM GOALS:  (STG=LTG)  GOALS Name Target Date  Goal status  1 Pt will demonstrate she has regained full shoulder ROM and function post operatively compared to baselines.  Baseline: 03/26/2022 {GOALSTATUS:25110}  2  {follow up:25551} {GOALSTATUS:25110}  3  {follow up:25551} {GOALSTATUS:25110}  4  {follow up:25551} {GOALSTATUS:25110}     PLAN: PT FREQUENCY/DURATION: ***  PLAN FOR NEXT SESSION: ***   [facility]  [address]  [place] [address]  [phone]  After Breast Cancer Class It is recommended you attend the ABC class to be educated on lymphedema risk reduction. This class is free of charge and lasts for 1 hour. It is a 1-time class. You will need to download the Webex app either on your phone or computer. We will send you a link the night before or the morning of the class. You should be able to click on that link to join the class. This is not a confidential class. You don't have to turn your  camera on, but other participants may be able to see your email address.  Scar massage You can begin gentle scar massage to you incision sites. Gently place one hand on the incision and move the skin (without sliding on the skin) in various directions. Do this for a few minutes and then you can gently massage either coconut oil or vitamin E cream into the scars.  Compression garment You should continue wearing your compression bra until you feel like you no longer have swelling.  Home exercise Program Continue doing the exercises you were given until you feel like you can do them without feeling any tightness at the end.   Walking Program Studies show that 30 minutes of walking per day (fast enough to elevate your heart rate) can significantly reduce the risk of a cancer recurrence. If you can't walk due to other medical reasons, we encourage you to find another activity you could do (like a stationary bike or water exercise).  Posture After breast cancer surgery, people frequently sit with rounded shoulders posture because it puts their incisions on slack and feels better. If you sit like this and scar tissue forms in that position, you can become very tight and have pain sitting or standing with good posture. Try to be aware of your posture and sit and stand up tall to heal properly.  Follow up PT: It is recommended you return every 3 months for the first 3 years following surgery to be assessed on the SOZO machine for an L-Dex score. This helps prevent clinically significant lymphedema in 95% of patients. These follow up screens are 10 minute appointments that you are not billed for.  Braylin Xu,MARTI COOPER, PT 03/18/2022, 3:00 PM

## 2022-03-19 ENCOUNTER — Ambulatory Visit: Payer: BC Managed Care – PPO | Attending: Hematology and Oncology | Admitting: Physical Therapy

## 2022-03-19 DIAGNOSIS — C50422 Malignant neoplasm of upper-outer quadrant of left male breast: Secondary | ICD-10-CM | POA: Insufficient documentation

## 2022-03-19 DIAGNOSIS — R293 Abnormal posture: Secondary | ICD-10-CM

## 2022-03-19 DIAGNOSIS — Z170 Estrogen receptor positive status [ER+]: Secondary | ICD-10-CM | POA: Insufficient documentation

## 2022-03-21 ENCOUNTER — Ambulatory Visit
Admission: RE | Admit: 2022-03-21 | Discharge: 2022-03-21 | Disposition: A | Discharge status: Home / Self Care | Payer: BC Managed Care – PPO | Source: Ambulatory Visit | Attending: Radiation Oncology | Admitting: Radiation Oncology

## 2022-03-21 DIAGNOSIS — Z170 Estrogen receptor positive status [ER+]: Secondary | ICD-10-CM | POA: Diagnosis not present

## 2022-03-21 DIAGNOSIS — Z510 Encounter for antineoplastic radiation therapy: Secondary | ICD-10-CM | POA: Insufficient documentation

## 2022-03-21 DIAGNOSIS — C50422 Malignant neoplasm of upper-outer quadrant of left male breast: Secondary | ICD-10-CM | POA: Diagnosis not present

## 2022-03-27 ENCOUNTER — Ambulatory Visit: Payer: BC Managed Care – PPO | Admitting: Radiation Oncology

## 2022-03-28 ENCOUNTER — Ambulatory Visit: Payer: BC Managed Care – PPO | Admitting: Radiation Oncology

## 2022-03-29 ENCOUNTER — Ambulatory Visit: Payer: BC Managed Care – PPO

## 2022-03-30 ENCOUNTER — Ambulatory Visit: Payer: BC Managed Care – PPO

## 2022-03-30 DIAGNOSIS — C50422 Malignant neoplasm of upper-outer quadrant of left male breast: Secondary | ICD-10-CM | POA: Diagnosis not present

## 2022-03-30 DIAGNOSIS — Z510 Encounter for antineoplastic radiation therapy: Secondary | ICD-10-CM | POA: Diagnosis not present

## 2022-03-30 DIAGNOSIS — Z170 Estrogen receptor positive status [ER+]: Secondary | ICD-10-CM | POA: Diagnosis not present

## 2022-04-02 ENCOUNTER — Other Ambulatory Visit: Payer: Self-pay

## 2022-04-02 ENCOUNTER — Ambulatory Visit: Payer: BC Managed Care – PPO

## 2022-04-02 ENCOUNTER — Ambulatory Visit
Admission: RE | Admit: 2022-04-02 | Discharge: 2022-04-02 | Disposition: A | Discharge status: Home / Self Care | Payer: BC Managed Care – PPO | Source: Ambulatory Visit | Attending: Radiation Oncology | Admitting: Radiation Oncology

## 2022-04-02 DIAGNOSIS — C50422 Malignant neoplasm of upper-outer quadrant of left male breast: Secondary | ICD-10-CM | POA: Diagnosis not present

## 2022-04-02 DIAGNOSIS — Z510 Encounter for antineoplastic radiation therapy: Secondary | ICD-10-CM | POA: Diagnosis not present

## 2022-04-02 DIAGNOSIS — Z170 Estrogen receptor positive status [ER+]: Secondary | ICD-10-CM | POA: Diagnosis not present

## 2022-04-02 LAB — RAD ONC ARIA SESSION SUMMARY

## 2022-04-03 ENCOUNTER — Other Ambulatory Visit: Payer: Self-pay

## 2022-04-03 ENCOUNTER — Ambulatory Visit
Admission: RE | Admit: 2022-04-03 | Discharge: 2022-04-03 | Disposition: A | Discharge status: Home / Self Care | Payer: BC Managed Care – PPO | Source: Ambulatory Visit | Attending: Radiation Oncology | Admitting: Radiation Oncology

## 2022-04-03 ENCOUNTER — Encounter: Payer: Self-pay | Admitting: *Deleted

## 2022-04-03 DIAGNOSIS — Z170 Estrogen receptor positive status [ER+]: Secondary | ICD-10-CM | POA: Diagnosis not present

## 2022-04-03 DIAGNOSIS — C50422 Malignant neoplasm of upper-outer quadrant of left male breast: Secondary | ICD-10-CM | POA: Diagnosis not present

## 2022-04-03 DIAGNOSIS — Z510 Encounter for antineoplastic radiation therapy: Secondary | ICD-10-CM | POA: Diagnosis not present

## 2022-04-03 LAB — RAD ONC ARIA SESSION SUMMARY
Course Elapsed Days: 1
Plan Fractions Treated to Date: 1
Plan Fractions Treated to Date: 2
Plan Prescribed Dose Per Fraction: 1.8 Gy
Plan Prescribed Dose Per Fraction: 1.8 Gy
Plan Total Fractions Prescribed: 14
Plan Total Fractions Prescribed: 28
Plan Total Prescribed Dose: 25.2 Gy
Plan Total Prescribed Dose: 50.4 Gy
Reference Point Dosage Given to Date: 3.6 Gy
Reference Point Dosage Given to Date: 3.6 Gy
Reference Point Session Dosage Given: 1.8 Gy
Reference Point Session Dosage Given: 1.8 Gy
Session Number: 2

## 2022-04-04 ENCOUNTER — Ambulatory Visit
Admission: RE | Admit: 2022-04-04 | Discharge: 2022-04-04 | Disposition: A | Discharge status: Home / Self Care | Payer: BC Managed Care – PPO | Source: Ambulatory Visit | Attending: Radiation Oncology | Admitting: Radiation Oncology

## 2022-04-04 ENCOUNTER — Other Ambulatory Visit: Payer: Self-pay

## 2022-04-04 DIAGNOSIS — Z170 Estrogen receptor positive status [ER+]: Secondary | ICD-10-CM | POA: Diagnosis not present

## 2022-04-04 DIAGNOSIS — C50422 Malignant neoplasm of upper-outer quadrant of left male breast: Secondary | ICD-10-CM | POA: Diagnosis not present

## 2022-04-04 DIAGNOSIS — Z510 Encounter for antineoplastic radiation therapy: Secondary | ICD-10-CM | POA: Diagnosis not present

## 2022-04-04 LAB — RAD ONC ARIA SESSION SUMMARY
Course Elapsed Days: 2
Plan Fractions Treated to Date: 2
Plan Fractions Treated to Date: 3
Plan Prescribed Dose Per Fraction: 1.8 Gy
Plan Prescribed Dose Per Fraction: 1.8 Gy
Plan Total Fractions Prescribed: 14
Plan Total Fractions Prescribed: 28
Plan Total Prescribed Dose: 25.2 Gy
Plan Total Prescribed Dose: 50.4 Gy
Reference Point Dosage Given to Date: 5.4 Gy
Reference Point Dosage Given to Date: 5.4 Gy
Reference Point Session Dosage Given: 1.8 Gy
Reference Point Session Dosage Given: 1.8 Gy
Session Number: 3

## 2022-04-05 ENCOUNTER — Other Ambulatory Visit: Payer: Self-pay

## 2022-04-05 ENCOUNTER — Ambulatory Visit
Admission: RE | Admit: 2022-04-05 | Discharge: 2022-04-05 | Disposition: A | Discharge status: Home / Self Care | Payer: BC Managed Care – PPO | Source: Ambulatory Visit | Attending: Radiation Oncology | Admitting: Radiation Oncology

## 2022-04-05 DIAGNOSIS — Z510 Encounter for antineoplastic radiation therapy: Secondary | ICD-10-CM | POA: Diagnosis not present

## 2022-04-05 DIAGNOSIS — Z170 Estrogen receptor positive status [ER+]: Secondary | ICD-10-CM | POA: Diagnosis not present

## 2022-04-05 DIAGNOSIS — C50422 Malignant neoplasm of upper-outer quadrant of left male breast: Secondary | ICD-10-CM | POA: Diagnosis not present

## 2022-04-05 LAB — RAD ONC ARIA SESSION SUMMARY
Course Elapsed Days: 3
Plan Fractions Treated to Date: 2
Plan Fractions Treated to Date: 4
Plan Prescribed Dose Per Fraction: 1.8 Gy
Plan Prescribed Dose Per Fraction: 1.8 Gy
Plan Total Fractions Prescribed: 14
Plan Total Fractions Prescribed: 28
Plan Total Prescribed Dose: 25.2 Gy
Plan Total Prescribed Dose: 50.4 Gy
Reference Point Dosage Given to Date: 7.2 Gy
Reference Point Dosage Given to Date: 7.2 Gy
Reference Point Session Dosage Given: 1.8 Gy
Reference Point Session Dosage Given: 1.8 Gy
Session Number: 4

## 2022-04-05 NOTE — Progress Notes (Signed)
Pt here for patient teaching.  Pt given Radiation and You booklet, skin care instructions, alra deodorant and Radiaplex.    Reviewed areas of pertinence such as fatigue, hair loss, skin changes, breast tenderness, and breast swelling. Pt able to give teach back of to pat skin and use unscented/gentle soap,apply Radiaplex bid, avoid applying anything to skin within 4 hours of treatment, and to use an electric razor if they must shave. Pt verbalizes understanding of information given and will contact nursing with any questions or concerns.    Gloriajean Dell. Leonie Green, BSN

## 2022-04-06 ENCOUNTER — Other Ambulatory Visit: Payer: Self-pay

## 2022-04-06 ENCOUNTER — Ambulatory Visit
Admission: RE | Admit: 2022-04-06 | Discharge: 2022-04-06 | Disposition: A | Discharge status: Home / Self Care | Payer: BC Managed Care – PPO | Source: Ambulatory Visit | Attending: Radiation Oncology | Admitting: Radiation Oncology

## 2022-04-06 DIAGNOSIS — Z170 Estrogen receptor positive status [ER+]: Secondary | ICD-10-CM

## 2022-04-06 DIAGNOSIS — C50422 Malignant neoplasm of upper-outer quadrant of left male breast: Secondary | ICD-10-CM | POA: Diagnosis not present

## 2022-04-06 DIAGNOSIS — Z510 Encounter for antineoplastic radiation therapy: Secondary | ICD-10-CM | POA: Diagnosis not present

## 2022-04-06 LAB — RAD ONC ARIA SESSION SUMMARY
Course Elapsed Days: 4
Plan Fractions Treated to Date: 3
Plan Fractions Treated to Date: 5
Plan Prescribed Dose Per Fraction: 1.8 Gy
Plan Prescribed Dose Per Fraction: 1.8 Gy
Plan Total Fractions Prescribed: 14
Plan Total Fractions Prescribed: 28
Plan Total Prescribed Dose: 25.2 Gy
Plan Total Prescribed Dose: 50.4 Gy
Reference Point Dosage Given to Date: 9 Gy
Reference Point Dosage Given to Date: 9 Gy
Reference Point Session Dosage Given: 1.8 Gy
Reference Point Session Dosage Given: 1.8 Gy
Session Number: 5

## 2022-04-06 MED ORDER — RADIAPLEXRX EX GEL: Freq: Once | CUTANEOUS | Status: AC

## 2022-04-06 MED ORDER — ALRA NON-METALLIC DEODORANT (RAD-ONC)
1.0000 | Freq: Once | TOPICAL | Status: AC
  Administered 2022-04-06: 1 via TOPICAL

## 2022-04-09 ENCOUNTER — Other Ambulatory Visit: Payer: Self-pay

## 2022-04-09 ENCOUNTER — Ambulatory Visit
Admission: RE | Admit: 2022-04-09 | Discharge: 2022-04-09 | Disposition: A | Discharge status: Home / Self Care | Payer: BC Managed Care – PPO | Source: Ambulatory Visit | Attending: Radiation Oncology | Admitting: Radiation Oncology

## 2022-04-09 DIAGNOSIS — C50422 Malignant neoplasm of upper-outer quadrant of left male breast: Secondary | ICD-10-CM | POA: Diagnosis not present

## 2022-04-09 DIAGNOSIS — C773 Secondary and unspecified malignant neoplasm of axilla and upper limb lymph nodes: Secondary | ICD-10-CM | POA: Diagnosis not present

## 2022-04-09 DIAGNOSIS — Z510 Encounter for antineoplastic radiation therapy: Secondary | ICD-10-CM | POA: Insufficient documentation

## 2022-04-09 DIAGNOSIS — Z170 Estrogen receptor positive status [ER+]: Secondary | ICD-10-CM | POA: Diagnosis not present

## 2022-04-09 LAB — RAD ONC ARIA SESSION SUMMARY
Course Elapsed Days: 7
Plan Fractions Treated to Date: 3
Plan Fractions Treated to Date: 6
Plan Prescribed Dose Per Fraction: 1.8 Gy
Plan Prescribed Dose Per Fraction: 1.8 Gy
Plan Total Fractions Prescribed: 14
Plan Total Fractions Prescribed: 28
Plan Total Prescribed Dose: 25.2 Gy
Plan Total Prescribed Dose: 50.4 Gy
Reference Point Dosage Given to Date: 10.8 Gy
Reference Point Dosage Given to Date: 10.8 Gy
Reference Point Session Dosage Given: 1.8 Gy
Reference Point Session Dosage Given: 1.8 Gy
Session Number: 6

## 2022-04-10 ENCOUNTER — Other Ambulatory Visit: Payer: Self-pay

## 2022-04-10 ENCOUNTER — Other Ambulatory Visit: Payer: Self-pay | Admitting: Oncology

## 2022-04-10 ENCOUNTER — Ambulatory Visit
Admission: RE | Admit: 2022-04-10 | Discharge: 2022-04-10 | Disposition: A | Discharge status: Home / Self Care | Payer: BC Managed Care – PPO | Source: Ambulatory Visit | Attending: Radiation Oncology | Admitting: Radiation Oncology

## 2022-04-10 DIAGNOSIS — Z170 Estrogen receptor positive status [ER+]: Secondary | ICD-10-CM | POA: Diagnosis not present

## 2022-04-10 DIAGNOSIS — Z510 Encounter for antineoplastic radiation therapy: Secondary | ICD-10-CM | POA: Diagnosis not present

## 2022-04-10 DIAGNOSIS — C50422 Malignant neoplasm of upper-outer quadrant of left male breast: Secondary | ICD-10-CM | POA: Diagnosis not present

## 2022-04-10 DIAGNOSIS — C773 Secondary and unspecified malignant neoplasm of axilla and upper limb lymph nodes: Secondary | ICD-10-CM | POA: Diagnosis not present

## 2022-04-10 LAB — RAD ONC ARIA SESSION SUMMARY
Course Elapsed Days: 8
Plan Fractions Treated to Date: 4
Plan Fractions Treated to Date: 7
Plan Prescribed Dose Per Fraction: 1.8 Gy
Plan Prescribed Dose Per Fraction: 1.8 Gy
Plan Total Fractions Prescribed: 14
Plan Total Fractions Prescribed: 28
Plan Total Prescribed Dose: 25.2 Gy
Plan Total Prescribed Dose: 50.4 Gy
Reference Point Dosage Given to Date: 12.6 Gy
Reference Point Dosage Given to Date: 12.6 Gy
Reference Point Session Dosage Given: 1.8 Gy
Reference Point Session Dosage Given: 1.8 Gy
Session Number: 7

## 2022-04-11 ENCOUNTER — Other Ambulatory Visit: Payer: Self-pay

## 2022-04-11 ENCOUNTER — Ambulatory Visit
Admission: RE | Admit: 2022-04-11 | Discharge: 2022-04-11 | Disposition: A | Discharge status: Home / Self Care | Payer: BC Managed Care – PPO | Source: Ambulatory Visit | Attending: Radiation Oncology | Admitting: Radiation Oncology

## 2022-04-11 DIAGNOSIS — Z510 Encounter for antineoplastic radiation therapy: Secondary | ICD-10-CM | POA: Diagnosis not present

## 2022-04-11 DIAGNOSIS — C50422 Malignant neoplasm of upper-outer quadrant of left male breast: Secondary | ICD-10-CM | POA: Diagnosis not present

## 2022-04-11 DIAGNOSIS — Z170 Estrogen receptor positive status [ER+]: Secondary | ICD-10-CM | POA: Diagnosis not present

## 2022-04-11 DIAGNOSIS — C773 Secondary and unspecified malignant neoplasm of axilla and upper limb lymph nodes: Secondary | ICD-10-CM | POA: Diagnosis not present

## 2022-04-11 LAB — RAD ONC ARIA SESSION SUMMARY
Course Elapsed Days: 9
Plan Fractions Treated to Date: 4
Plan Fractions Treated to Date: 8
Plan Prescribed Dose Per Fraction: 1.8 Gy
Plan Prescribed Dose Per Fraction: 1.8 Gy
Plan Total Fractions Prescribed: 14
Plan Total Fractions Prescribed: 28
Plan Total Prescribed Dose: 25.2 Gy
Plan Total Prescribed Dose: 50.4 Gy
Reference Point Dosage Given to Date: 14.4 Gy
Reference Point Dosage Given to Date: 14.4 Gy
Reference Point Session Dosage Given: 1.8 Gy
Reference Point Session Dosage Given: 1.8 Gy
Session Number: 8

## 2022-04-12 ENCOUNTER — Other Ambulatory Visit: Payer: Self-pay

## 2022-04-12 ENCOUNTER — Ambulatory Visit
Admission: RE | Admit: 2022-04-12 | Discharge: 2022-04-12 | Disposition: A | Discharge status: Home / Self Care | Payer: BC Managed Care – PPO | Source: Ambulatory Visit | Attending: Radiation Oncology | Admitting: Radiation Oncology

## 2022-04-12 DIAGNOSIS — C773 Secondary and unspecified malignant neoplasm of axilla and upper limb lymph nodes: Secondary | ICD-10-CM | POA: Diagnosis not present

## 2022-04-12 DIAGNOSIS — C50422 Malignant neoplasm of upper-outer quadrant of left male breast: Secondary | ICD-10-CM | POA: Diagnosis not present

## 2022-04-12 DIAGNOSIS — Z170 Estrogen receptor positive status [ER+]: Secondary | ICD-10-CM | POA: Diagnosis not present

## 2022-04-12 DIAGNOSIS — Z510 Encounter for antineoplastic radiation therapy: Secondary | ICD-10-CM | POA: Diagnosis not present

## 2022-04-12 LAB — RAD ONC ARIA SESSION SUMMARY
Course Elapsed Days: 10
Plan Fractions Treated to Date: 5
Plan Fractions Treated to Date: 9
Plan Prescribed Dose Per Fraction: 1.8 Gy
Plan Prescribed Dose Per Fraction: 1.8 Gy
Plan Total Fractions Prescribed: 14
Plan Total Fractions Prescribed: 28
Plan Total Prescribed Dose: 25.2 Gy
Plan Total Prescribed Dose: 50.4 Gy
Reference Point Dosage Given to Date: 16.2 Gy
Reference Point Dosage Given to Date: 16.2 Gy
Reference Point Session Dosage Given: 1.8 Gy
Reference Point Session Dosage Given: 1.8 Gy
Session Number: 9

## 2022-04-13 ENCOUNTER — Other Ambulatory Visit: Payer: Self-pay

## 2022-04-13 ENCOUNTER — Ambulatory Visit
Admission: RE | Admit: 2022-04-13 | Discharge: 2022-04-13 | Disposition: A | Discharge status: Home / Self Care | Payer: BC Managed Care – PPO | Source: Ambulatory Visit | Attending: Radiation Oncology | Admitting: Radiation Oncology

## 2022-04-13 ENCOUNTER — Ambulatory Visit: Payer: BC Managed Care – PPO | Admitting: Cardiology

## 2022-04-13 ENCOUNTER — Ambulatory Visit
Admission: RE | Admit: 2022-04-13 | Discharge: 2022-04-13 | Disposition: A | Discharge status: Home / Self Care | Payer: BC Managed Care – PPO | Source: Ambulatory Visit | Attending: Hematology and Oncology | Admitting: Hematology and Oncology

## 2022-04-13 DIAGNOSIS — Z510 Encounter for antineoplastic radiation therapy: Secondary | ICD-10-CM | POA: Diagnosis not present

## 2022-04-13 DIAGNOSIS — C773 Secondary and unspecified malignant neoplasm of axilla and upper limb lymph nodes: Secondary | ICD-10-CM | POA: Diagnosis not present

## 2022-04-13 DIAGNOSIS — Z170 Estrogen receptor positive status [ER+]: Secondary | ICD-10-CM | POA: Diagnosis not present

## 2022-04-13 DIAGNOSIS — C50422 Malignant neoplasm of upper-outer quadrant of left male breast: Secondary | ICD-10-CM | POA: Diagnosis not present

## 2022-04-13 LAB — RAD ONC ARIA SESSION SUMMARY
Course Elapsed Days: 11
Plan Fractions Treated to Date: 10
Plan Fractions Treated to Date: 5
Plan Prescribed Dose Per Fraction: 1.8 Gy
Plan Prescribed Dose Per Fraction: 1.8 Gy
Plan Total Fractions Prescribed: 14
Plan Total Fractions Prescribed: 28
Plan Total Prescribed Dose: 25.2 Gy
Plan Total Prescribed Dose: 50.4 Gy
Reference Point Dosage Given to Date: 18 Gy
Reference Point Dosage Given to Date: 18 Gy
Reference Point Session Dosage Given: 1.8 Gy
Reference Point Session Dosage Given: 1.8 Gy
Session Number: 10

## 2022-04-16 ENCOUNTER — Other Ambulatory Visit: Payer: Self-pay

## 2022-04-16 ENCOUNTER — Ambulatory Visit
Admission: RE | Admit: 2022-04-16 | Discharge: 2022-04-16 | Disposition: A | Discharge status: Home / Self Care | Payer: BC Managed Care – PPO | Source: Ambulatory Visit | Attending: Radiation Oncology | Admitting: Radiation Oncology

## 2022-04-16 DIAGNOSIS — Z510 Encounter for antineoplastic radiation therapy: Secondary | ICD-10-CM | POA: Diagnosis not present

## 2022-04-16 DIAGNOSIS — Z170 Estrogen receptor positive status [ER+]: Secondary | ICD-10-CM | POA: Diagnosis not present

## 2022-04-16 DIAGNOSIS — C773 Secondary and unspecified malignant neoplasm of axilla and upper limb lymph nodes: Secondary | ICD-10-CM | POA: Diagnosis not present

## 2022-04-16 DIAGNOSIS — C50422 Malignant neoplasm of upper-outer quadrant of left male breast: Secondary | ICD-10-CM | POA: Diagnosis not present

## 2022-04-16 LAB — RAD ONC ARIA SESSION SUMMARY
Course Elapsed Days: 14
Plan Fractions Treated to Date: 11
Plan Fractions Treated to Date: 6
Plan Prescribed Dose Per Fraction: 1.8 Gy
Plan Prescribed Dose Per Fraction: 1.8 Gy
Plan Total Fractions Prescribed: 14
Plan Total Fractions Prescribed: 28
Plan Total Prescribed Dose: 25.2 Gy
Plan Total Prescribed Dose: 50.4 Gy
Reference Point Dosage Given to Date: 19.8 Gy
Reference Point Dosage Given to Date: 19.8 Gy
Reference Point Session Dosage Given: 1.8 Gy
Reference Point Session Dosage Given: 1.8 Gy
Session Number: 11

## 2022-04-17 ENCOUNTER — Ambulatory Visit
Admission: RE | Admit: 2022-04-17 | Discharge: 2022-04-17 | Disposition: A | Discharge status: Home / Self Care | Payer: BC Managed Care – PPO | Source: Ambulatory Visit | Attending: Radiation Oncology | Admitting: Radiation Oncology

## 2022-04-17 ENCOUNTER — Other Ambulatory Visit: Payer: Self-pay

## 2022-04-17 DIAGNOSIS — Z510 Encounter for antineoplastic radiation therapy: Secondary | ICD-10-CM | POA: Diagnosis not present

## 2022-04-17 DIAGNOSIS — C773 Secondary and unspecified malignant neoplasm of axilla and upper limb lymph nodes: Secondary | ICD-10-CM | POA: Diagnosis not present

## 2022-04-17 DIAGNOSIS — C50422 Malignant neoplasm of upper-outer quadrant of left male breast: Secondary | ICD-10-CM | POA: Diagnosis not present

## 2022-04-17 DIAGNOSIS — Z170 Estrogen receptor positive status [ER+]: Secondary | ICD-10-CM | POA: Diagnosis not present

## 2022-04-17 LAB — RAD ONC ARIA SESSION SUMMARY
Course Elapsed Days: 15
Plan Fractions Treated to Date: 12
Plan Fractions Treated to Date: 6
Plan Prescribed Dose Per Fraction: 1.8 Gy
Plan Prescribed Dose Per Fraction: 1.8 Gy
Plan Total Fractions Prescribed: 14
Plan Total Fractions Prescribed: 28
Plan Total Prescribed Dose: 25.2 Gy
Plan Total Prescribed Dose: 50.4 Gy
Reference Point Dosage Given to Date: 21.6 Gy
Reference Point Dosage Given to Date: 21.6 Gy
Reference Point Session Dosage Given: 1.8 Gy
Reference Point Session Dosage Given: 1.8 Gy
Session Number: 12

## 2022-04-18 ENCOUNTER — Other Ambulatory Visit: Payer: Self-pay

## 2022-04-18 ENCOUNTER — Ambulatory Visit
Admission: RE | Admit: 2022-04-18 | Discharge: 2022-04-18 | Disposition: A | Discharge status: Home / Self Care | Payer: BC Managed Care – PPO | Source: Ambulatory Visit | Attending: Radiation Oncology | Admitting: Radiation Oncology

## 2022-04-18 DIAGNOSIS — Z510 Encounter for antineoplastic radiation therapy: Secondary | ICD-10-CM | POA: Diagnosis not present

## 2022-04-18 DIAGNOSIS — C50422 Malignant neoplasm of upper-outer quadrant of left male breast: Secondary | ICD-10-CM | POA: Diagnosis not present

## 2022-04-18 DIAGNOSIS — Z170 Estrogen receptor positive status [ER+]: Secondary | ICD-10-CM | POA: Diagnosis not present

## 2022-04-18 DIAGNOSIS — C773 Secondary and unspecified malignant neoplasm of axilla and upper limb lymph nodes: Secondary | ICD-10-CM | POA: Diagnosis not present

## 2022-04-18 LAB — RAD ONC ARIA SESSION SUMMARY
Course Elapsed Days: 16
Plan Fractions Treated to Date: 13
Plan Fractions Treated to Date: 7
Plan Prescribed Dose Per Fraction: 1.8 Gy
Plan Prescribed Dose Per Fraction: 1.8 Gy
Plan Total Fractions Prescribed: 14
Plan Total Fractions Prescribed: 28
Plan Total Prescribed Dose: 25.2 Gy
Plan Total Prescribed Dose: 50.4 Gy
Reference Point Dosage Given to Date: 23.4 Gy
Reference Point Dosage Given to Date: 23.4 Gy
Reference Point Session Dosage Given: 1.8 Gy
Reference Point Session Dosage Given: 1.8 Gy
Session Number: 13

## 2022-04-19 ENCOUNTER — Ambulatory Visit
Admission: RE | Admit: 2022-04-19 | Discharge: 2022-04-19 | Disposition: A | Discharge status: Home / Self Care | Payer: BC Managed Care – PPO | Source: Ambulatory Visit | Attending: Radiation Oncology | Admitting: Radiation Oncology

## 2022-04-19 ENCOUNTER — Other Ambulatory Visit: Payer: Self-pay

## 2022-04-19 ENCOUNTER — Encounter: Payer: Self-pay | Admitting: *Deleted

## 2022-04-19 DIAGNOSIS — Z510 Encounter for antineoplastic radiation therapy: Secondary | ICD-10-CM | POA: Diagnosis not present

## 2022-04-19 DIAGNOSIS — C50422 Malignant neoplasm of upper-outer quadrant of left male breast: Secondary | ICD-10-CM | POA: Diagnosis not present

## 2022-04-19 DIAGNOSIS — C773 Secondary and unspecified malignant neoplasm of axilla and upper limb lymph nodes: Secondary | ICD-10-CM | POA: Diagnosis not present

## 2022-04-19 DIAGNOSIS — Z170 Estrogen receptor positive status [ER+]: Secondary | ICD-10-CM | POA: Diagnosis not present

## 2022-04-19 LAB — RAD ONC ARIA SESSION SUMMARY
Course Elapsed Days: 17
Plan Fractions Treated to Date: 14
Plan Fractions Treated to Date: 7
Plan Prescribed Dose Per Fraction: 1.8 Gy
Plan Prescribed Dose Per Fraction: 1.8 Gy
Plan Total Fractions Prescribed: 14
Plan Total Fractions Prescribed: 28
Plan Total Prescribed Dose: 25.2 Gy
Plan Total Prescribed Dose: 50.4 Gy
Reference Point Dosage Given to Date: 25.2 Gy
Reference Point Dosage Given to Date: 25.2 Gy
Reference Point Session Dosage Given: 1.8 Gy
Reference Point Session Dosage Given: 1.8 Gy
Session Number: 14

## 2022-04-20 ENCOUNTER — Ambulatory Visit
Admission: RE | Admit: 2022-04-20 | Discharge: 2022-04-20 | Disposition: A | Discharge status: Home / Self Care | Payer: BC Managed Care – PPO | Source: Ambulatory Visit | Attending: Radiation Oncology | Admitting: Radiation Oncology

## 2022-04-20 ENCOUNTER — Other Ambulatory Visit: Payer: Self-pay

## 2022-04-20 DIAGNOSIS — C50422 Malignant neoplasm of upper-outer quadrant of left male breast: Secondary | ICD-10-CM | POA: Diagnosis not present

## 2022-04-20 DIAGNOSIS — Z170 Estrogen receptor positive status [ER+]: Secondary | ICD-10-CM | POA: Diagnosis not present

## 2022-04-20 DIAGNOSIS — C773 Secondary and unspecified malignant neoplasm of axilla and upper limb lymph nodes: Secondary | ICD-10-CM | POA: Diagnosis not present

## 2022-04-20 DIAGNOSIS — Z510 Encounter for antineoplastic radiation therapy: Secondary | ICD-10-CM | POA: Diagnosis not present

## 2022-04-20 LAB — RAD ONC ARIA SESSION SUMMARY
Course Elapsed Days: 18
Plan Fractions Treated to Date: 15
Plan Fractions Treated to Date: 8
Plan Prescribed Dose Per Fraction: 1.8 Gy
Plan Prescribed Dose Per Fraction: 1.8 Gy
Plan Total Fractions Prescribed: 14
Plan Total Fractions Prescribed: 28
Plan Total Prescribed Dose: 25.2 Gy
Plan Total Prescribed Dose: 50.4 Gy
Reference Point Dosage Given to Date: 27 Gy
Reference Point Dosage Given to Date: 27 Gy
Reference Point Session Dosage Given: 1.8 Gy
Reference Point Session Dosage Given: 1.8 Gy
Session Number: 15

## 2022-04-23 ENCOUNTER — Ambulatory Visit
Admission: RE | Admit: 2022-04-23 | Discharge: 2022-04-23 | Disposition: A | Discharge status: Home / Self Care | Payer: BC Managed Care – PPO | Source: Ambulatory Visit | Attending: Radiation Oncology | Admitting: Radiation Oncology

## 2022-04-23 ENCOUNTER — Other Ambulatory Visit: Payer: Self-pay

## 2022-04-23 DIAGNOSIS — C50422 Malignant neoplasm of upper-outer quadrant of left male breast: Secondary | ICD-10-CM | POA: Diagnosis not present

## 2022-04-23 DIAGNOSIS — Z510 Encounter for antineoplastic radiation therapy: Secondary | ICD-10-CM | POA: Diagnosis not present

## 2022-04-23 DIAGNOSIS — C773 Secondary and unspecified malignant neoplasm of axilla and upper limb lymph nodes: Secondary | ICD-10-CM | POA: Diagnosis not present

## 2022-04-23 LAB — RAD ONC ARIA SESSION SUMMARY
Course Elapsed Days: 21
Plan Fractions Treated to Date: 16
Plan Fractions Treated to Date: 8
Plan Prescribed Dose Per Fraction: 1.8 Gy
Plan Prescribed Dose Per Fraction: 1.8 Gy
Plan Total Fractions Prescribed: 14
Plan Total Fractions Prescribed: 28
Plan Total Prescribed Dose: 25.2 Gy
Plan Total Prescribed Dose: 50.4 Gy
Reference Point Dosage Given to Date: 28.8 Gy
Reference Point Dosage Given to Date: 28.8 Gy
Reference Point Session Dosage Given: 1.8 Gy
Reference Point Session Dosage Given: 1.8 Gy
Session Number: 16

## 2022-04-24 ENCOUNTER — Ambulatory Visit
Admission: RE | Admit: 2022-04-24 | Discharge: 2022-04-24 | Disposition: A | Discharge status: Home / Self Care | Payer: BC Managed Care – PPO | Source: Ambulatory Visit | Attending: Radiation Oncology | Admitting: Radiation Oncology

## 2022-04-24 ENCOUNTER — Other Ambulatory Visit: Payer: Self-pay

## 2022-04-24 DIAGNOSIS — C773 Secondary and unspecified malignant neoplasm of axilla and upper limb lymph nodes: Secondary | ICD-10-CM | POA: Diagnosis not present

## 2022-04-24 DIAGNOSIS — Z510 Encounter for antineoplastic radiation therapy: Secondary | ICD-10-CM | POA: Diagnosis not present

## 2022-04-24 DIAGNOSIS — C50422 Malignant neoplasm of upper-outer quadrant of left male breast: Secondary | ICD-10-CM | POA: Diagnosis not present

## 2022-04-24 LAB — RAD ONC ARIA SESSION SUMMARY
Course Elapsed Days: 22
Plan Fractions Treated to Date: 17
Plan Fractions Treated to Date: 9
Plan Prescribed Dose Per Fraction: 1.8 Gy
Plan Prescribed Dose Per Fraction: 1.8 Gy
Plan Total Fractions Prescribed: 14
Plan Total Fractions Prescribed: 28
Plan Total Prescribed Dose: 25.2 Gy
Plan Total Prescribed Dose: 50.4 Gy
Reference Point Dosage Given to Date: 30.6 Gy
Reference Point Dosage Given to Date: 30.6 Gy
Reference Point Session Dosage Given: 1.8 Gy
Reference Point Session Dosage Given: 1.8 Gy
Session Number: 17

## 2022-04-25 ENCOUNTER — Other Ambulatory Visit: Payer: Self-pay

## 2022-04-25 ENCOUNTER — Ambulatory Visit
Admission: RE | Admit: 2022-04-25 | Discharge: 2022-04-25 | Disposition: A | Discharge status: Home / Self Care | Payer: BC Managed Care – PPO | Source: Ambulatory Visit | Attending: Radiation Oncology | Admitting: Radiation Oncology

## 2022-04-25 DIAGNOSIS — Z510 Encounter for antineoplastic radiation therapy: Secondary | ICD-10-CM | POA: Diagnosis not present

## 2022-04-25 DIAGNOSIS — C50422 Malignant neoplasm of upper-outer quadrant of left male breast: Secondary | ICD-10-CM | POA: Diagnosis not present

## 2022-04-25 DIAGNOSIS — C773 Secondary and unspecified malignant neoplasm of axilla and upper limb lymph nodes: Secondary | ICD-10-CM | POA: Diagnosis not present

## 2022-04-25 LAB — RAD ONC ARIA SESSION SUMMARY
Course Elapsed Days: 23
Plan Fractions Treated to Date: 18
Plan Fractions Treated to Date: 9
Plan Prescribed Dose Per Fraction: 1.8 Gy
Plan Prescribed Dose Per Fraction: 1.8 Gy
Plan Total Fractions Prescribed: 14
Plan Total Fractions Prescribed: 28
Plan Total Prescribed Dose: 25.2 Gy
Plan Total Prescribed Dose: 50.4 Gy
Reference Point Dosage Given to Date: 32.4 Gy
Reference Point Dosage Given to Date: 32.4 Gy
Reference Point Session Dosage Given: 1.8 Gy
Reference Point Session Dosage Given: 1.8 Gy
Session Number: 18

## 2022-04-26 ENCOUNTER — Other Ambulatory Visit: Payer: Self-pay

## 2022-04-26 ENCOUNTER — Ambulatory Visit
Admission: RE | Admit: 2022-04-26 | Discharge: 2022-04-26 | Disposition: A | Discharge status: Home / Self Care | Payer: BC Managed Care – PPO | Source: Ambulatory Visit | Attending: Radiation Oncology | Admitting: Radiation Oncology

## 2022-04-26 DIAGNOSIS — Z510 Encounter for antineoplastic radiation therapy: Secondary | ICD-10-CM | POA: Diagnosis not present

## 2022-04-26 DIAGNOSIS — C50422 Malignant neoplasm of upper-outer quadrant of left male breast: Secondary | ICD-10-CM | POA: Diagnosis not present

## 2022-04-26 DIAGNOSIS — C773 Secondary and unspecified malignant neoplasm of axilla and upper limb lymph nodes: Secondary | ICD-10-CM | POA: Diagnosis not present

## 2022-04-26 LAB — RAD ONC ARIA SESSION SUMMARY
Course Elapsed Days: 24
Plan Fractions Treated to Date: 10
Plan Fractions Treated to Date: 19
Plan Prescribed Dose Per Fraction: 1.8 Gy
Plan Prescribed Dose Per Fraction: 1.8 Gy
Plan Total Fractions Prescribed: 14
Plan Total Fractions Prescribed: 28
Plan Total Prescribed Dose: 25.2 Gy
Plan Total Prescribed Dose: 50.4 Gy
Reference Point Dosage Given to Date: 34.2 Gy
Reference Point Dosage Given to Date: 34.2 Gy
Reference Point Session Dosage Given: 1.8 Gy
Reference Point Session Dosage Given: 1.8 Gy
Session Number: 19

## 2022-04-27 ENCOUNTER — Ambulatory Visit
Admission: RE | Admit: 2022-04-27 | Discharge: 2022-04-27 | Disposition: A | Discharge status: Home / Self Care | Payer: BC Managed Care – PPO | Source: Ambulatory Visit | Attending: Radiation Oncology | Admitting: Radiation Oncology

## 2022-04-27 ENCOUNTER — Other Ambulatory Visit: Payer: Self-pay

## 2022-04-27 DIAGNOSIS — C773 Secondary and unspecified malignant neoplasm of axilla and upper limb lymph nodes: Secondary | ICD-10-CM | POA: Diagnosis not present

## 2022-04-27 DIAGNOSIS — C50422 Malignant neoplasm of upper-outer quadrant of left male breast: Secondary | ICD-10-CM | POA: Diagnosis not present

## 2022-04-27 DIAGNOSIS — Z170 Estrogen receptor positive status [ER+]: Secondary | ICD-10-CM | POA: Diagnosis not present

## 2022-04-27 DIAGNOSIS — Z510 Encounter for antineoplastic radiation therapy: Secondary | ICD-10-CM | POA: Diagnosis not present

## 2022-04-27 LAB — RAD ONC ARIA SESSION SUMMARY
Course Elapsed Days: 25
Plan Fractions Treated to Date: 10
Plan Fractions Treated to Date: 20
Plan Prescribed Dose Per Fraction: 1.8 Gy
Plan Prescribed Dose Per Fraction: 1.8 Gy
Plan Total Fractions Prescribed: 14
Plan Total Fractions Prescribed: 28
Plan Total Prescribed Dose: 25.2 Gy
Plan Total Prescribed Dose: 50.4 Gy
Reference Point Dosage Given to Date: 36 Gy
Reference Point Dosage Given to Date: 36 Gy
Reference Point Session Dosage Given: 1.8 Gy
Reference Point Session Dosage Given: 1.8 Gy
Session Number: 20

## 2022-04-30 ENCOUNTER — Other Ambulatory Visit: Payer: Self-pay

## 2022-04-30 ENCOUNTER — Ambulatory Visit
Admission: RE | Admit: 2022-04-30 | Discharge: 2022-04-30 | Disposition: A | Discharge status: Home / Self Care | Payer: BC Managed Care – PPO | Source: Ambulatory Visit | Attending: Radiation Oncology | Admitting: Radiation Oncology

## 2022-04-30 DIAGNOSIS — C773 Secondary and unspecified malignant neoplasm of axilla and upper limb lymph nodes: Secondary | ICD-10-CM | POA: Diagnosis not present

## 2022-04-30 DIAGNOSIS — Z510 Encounter for antineoplastic radiation therapy: Secondary | ICD-10-CM | POA: Diagnosis not present

## 2022-04-30 DIAGNOSIS — C50422 Malignant neoplasm of upper-outer quadrant of left male breast: Secondary | ICD-10-CM | POA: Diagnosis not present

## 2022-04-30 DIAGNOSIS — C50912 Malignant neoplasm of unspecified site of left female breast: Secondary | ICD-10-CM

## 2022-04-30 LAB — RAD ONC ARIA SESSION SUMMARY
Course Elapsed Days: 28
Plan Fractions Treated to Date: 11
Plan Fractions Treated to Date: 21
Plan Prescribed Dose Per Fraction: 1.8 Gy
Plan Prescribed Dose Per Fraction: 1.8 Gy
Plan Total Fractions Prescribed: 14
Plan Total Fractions Prescribed: 28
Plan Total Prescribed Dose: 25.2 Gy
Plan Total Prescribed Dose: 50.4 Gy
Reference Point Dosage Given to Date: 37.8 Gy
Reference Point Dosage Given to Date: 37.8 Gy
Reference Point Session Dosage Given: 1.8 Gy
Reference Point Session Dosage Given: 1.8 Gy
Session Number: 21

## 2022-04-30 MED ORDER — RADIAPLEXRX EX GEL: Freq: Once | CUTANEOUS | Status: AC

## 2022-05-01 ENCOUNTER — Ambulatory Visit
Admission: RE | Admit: 2022-05-01 | Discharge: 2022-05-01 | Disposition: A | Discharge status: Home / Self Care | Payer: BC Managed Care – PPO | Source: Ambulatory Visit | Attending: Radiation Oncology | Admitting: Radiation Oncology

## 2022-05-01 ENCOUNTER — Other Ambulatory Visit: Payer: Self-pay

## 2022-05-01 DIAGNOSIS — Z510 Encounter for antineoplastic radiation therapy: Secondary | ICD-10-CM | POA: Diagnosis not present

## 2022-05-01 DIAGNOSIS — C50422 Malignant neoplasm of upper-outer quadrant of left male breast: Secondary | ICD-10-CM | POA: Diagnosis not present

## 2022-05-01 DIAGNOSIS — C773 Secondary and unspecified malignant neoplasm of axilla and upper limb lymph nodes: Secondary | ICD-10-CM | POA: Diagnosis not present

## 2022-05-01 LAB — RAD ONC ARIA SESSION SUMMARY
Course Elapsed Days: 29
Plan Fractions Treated to Date: 11
Plan Fractions Treated to Date: 22
Plan Prescribed Dose Per Fraction: 1.8 Gy
Plan Prescribed Dose Per Fraction: 1.8 Gy
Plan Total Fractions Prescribed: 14
Plan Total Fractions Prescribed: 28
Plan Total Prescribed Dose: 25.2 Gy
Plan Total Prescribed Dose: 50.4 Gy
Reference Point Dosage Given to Date: 39.6 Gy
Reference Point Dosage Given to Date: 39.6 Gy
Reference Point Session Dosage Given: 1.8 Gy
Reference Point Session Dosage Given: 1.8 Gy
Session Number: 22

## 2022-05-02 ENCOUNTER — Other Ambulatory Visit: Payer: Self-pay

## 2022-05-02 ENCOUNTER — Ambulatory Visit
Admission: RE | Admit: 2022-05-02 | Discharge: 2022-05-02 | Disposition: A | Discharge status: Home / Self Care | Payer: BC Managed Care – PPO | Source: Ambulatory Visit | Attending: Radiation Oncology | Admitting: Radiation Oncology

## 2022-05-02 DIAGNOSIS — Z510 Encounter for antineoplastic radiation therapy: Secondary | ICD-10-CM | POA: Diagnosis not present

## 2022-05-02 DIAGNOSIS — C773 Secondary and unspecified malignant neoplasm of axilla and upper limb lymph nodes: Secondary | ICD-10-CM | POA: Diagnosis not present

## 2022-05-02 DIAGNOSIS — C50422 Malignant neoplasm of upper-outer quadrant of left male breast: Secondary | ICD-10-CM | POA: Diagnosis not present

## 2022-05-02 LAB — RAD ONC ARIA SESSION SUMMARY
Course Elapsed Days: 30
Plan Fractions Treated to Date: 12
Plan Fractions Treated to Date: 23
Plan Prescribed Dose Per Fraction: 1.8 Gy
Plan Prescribed Dose Per Fraction: 1.8 Gy
Plan Total Fractions Prescribed: 14
Plan Total Fractions Prescribed: 28
Plan Total Prescribed Dose: 25.2 Gy
Plan Total Prescribed Dose: 50.4 Gy
Reference Point Dosage Given to Date: 41.4 Gy
Reference Point Dosage Given to Date: 41.4 Gy
Reference Point Session Dosage Given: 1.8 Gy
Reference Point Session Dosage Given: 1.8 Gy
Session Number: 23

## 2022-05-03 ENCOUNTER — Ambulatory Visit: Payer: BC Managed Care – PPO

## 2022-05-03 ENCOUNTER — Other Ambulatory Visit: Payer: Self-pay

## 2022-05-03 ENCOUNTER — Ambulatory Visit
Admission: RE | Admit: 2022-05-03 | Discharge: 2022-05-03 | Disposition: A | Discharge status: Home / Self Care | Payer: BC Managed Care – PPO | Source: Ambulatory Visit | Attending: Radiation Oncology | Admitting: Radiation Oncology

## 2022-05-03 DIAGNOSIS — Z510 Encounter for antineoplastic radiation therapy: Secondary | ICD-10-CM | POA: Diagnosis not present

## 2022-05-03 DIAGNOSIS — C50422 Malignant neoplasm of upper-outer quadrant of left male breast: Secondary | ICD-10-CM | POA: Diagnosis not present

## 2022-05-03 DIAGNOSIS — C773 Secondary and unspecified malignant neoplasm of axilla and upper limb lymph nodes: Secondary | ICD-10-CM | POA: Diagnosis not present

## 2022-05-03 LAB — RAD ONC ARIA SESSION SUMMARY
Course Elapsed Days: 31
Plan Fractions Treated to Date: 12
Plan Fractions Treated to Date: 24
Plan Prescribed Dose Per Fraction: 1.8 Gy
Plan Prescribed Dose Per Fraction: 1.8 Gy
Plan Total Fractions Prescribed: 14
Plan Total Fractions Prescribed: 28
Plan Total Prescribed Dose: 25.2 Gy
Plan Total Prescribed Dose: 50.4 Gy
Reference Point Dosage Given to Date: 43.2 Gy
Reference Point Dosage Given to Date: 43.2 Gy
Reference Point Session Dosage Given: 1.8 Gy
Reference Point Session Dosage Given: 1.8 Gy
Session Number: 24

## 2022-05-04 ENCOUNTER — Other Ambulatory Visit: Payer: Self-pay

## 2022-05-04 ENCOUNTER — Ambulatory Visit
Admission: RE | Admit: 2022-05-04 | Discharge: 2022-05-04 | Disposition: A | Discharge status: Home / Self Care | Payer: BC Managed Care – PPO | Source: Ambulatory Visit | Attending: Radiation Oncology | Admitting: Radiation Oncology

## 2022-05-04 ENCOUNTER — Ambulatory Visit: Payer: BC Managed Care – PPO | Admitting: Radiation Oncology

## 2022-05-04 DIAGNOSIS — Z170 Estrogen receptor positive status [ER+]: Secondary | ICD-10-CM | POA: Diagnosis not present

## 2022-05-04 DIAGNOSIS — Z510 Encounter for antineoplastic radiation therapy: Secondary | ICD-10-CM | POA: Diagnosis not present

## 2022-05-04 DIAGNOSIS — C773 Secondary and unspecified malignant neoplasm of axilla and upper limb lymph nodes: Secondary | ICD-10-CM | POA: Diagnosis not present

## 2022-05-04 DIAGNOSIS — C50422 Malignant neoplasm of upper-outer quadrant of left male breast: Secondary | ICD-10-CM | POA: Diagnosis not present

## 2022-05-04 LAB — RAD ONC ARIA SESSION SUMMARY
Course Elapsed Days: 32
Plan Fractions Treated to Date: 13
Plan Fractions Treated to Date: 25
Plan Prescribed Dose Per Fraction: 1.8 Gy
Plan Prescribed Dose Per Fraction: 1.8 Gy
Plan Total Fractions Prescribed: 14
Plan Total Fractions Prescribed: 28
Plan Total Prescribed Dose: 25.2 Gy
Plan Total Prescribed Dose: 50.4 Gy
Reference Point Dosage Given to Date: 45 Gy
Reference Point Dosage Given to Date: 45 Gy
Reference Point Session Dosage Given: 1.8 Gy
Reference Point Session Dosage Given: 1.8 Gy
Session Number: 25

## 2022-05-07 ENCOUNTER — Other Ambulatory Visit: Payer: Self-pay

## 2022-05-07 ENCOUNTER — Ambulatory Visit: Payer: BC Managed Care – PPO

## 2022-05-07 ENCOUNTER — Ambulatory Visit
Admission: RE | Admit: 2022-05-07 | Discharge: 2022-05-07 | Disposition: A | Discharge status: Home / Self Care | Payer: BC Managed Care – PPO | Source: Ambulatory Visit | Attending: Radiation Oncology | Admitting: Radiation Oncology

## 2022-05-07 DIAGNOSIS — C50422 Malignant neoplasm of upper-outer quadrant of left male breast: Secondary | ICD-10-CM | POA: Diagnosis not present

## 2022-05-07 DIAGNOSIS — Z510 Encounter for antineoplastic radiation therapy: Secondary | ICD-10-CM | POA: Diagnosis not present

## 2022-05-07 DIAGNOSIS — C773 Secondary and unspecified malignant neoplasm of axilla and upper limb lymph nodes: Secondary | ICD-10-CM | POA: Diagnosis not present

## 2022-05-07 LAB — RAD ONC ARIA SESSION SUMMARY
Course Elapsed Days: 35
Plan Fractions Treated to Date: 13
Plan Fractions Treated to Date: 26
Plan Prescribed Dose Per Fraction: 1.8 Gy
Plan Prescribed Dose Per Fraction: 1.8 Gy
Plan Total Fractions Prescribed: 14
Plan Total Fractions Prescribed: 28
Plan Total Prescribed Dose: 25.2 Gy
Plan Total Prescribed Dose: 50.4 Gy
Reference Point Dosage Given to Date: 46.8 Gy
Reference Point Dosage Given to Date: 46.8 Gy
Reference Point Session Dosage Given: 1.8 Gy
Reference Point Session Dosage Given: 1.8 Gy
Session Number: 26

## 2022-05-07 NOTE — Progress Notes (Signed)
Patient Care Team: Pcp, No as PCP - General Mauro Kaufmann, RN as Oncology Nurse Navigator Rockwell Germany, RN as Oncology Nurse Navigator Elease Hashimoto (Neurology)  DIAGNOSIS:  Encounter Diagnosis  Name Primary?   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 CT 08/27/2021 in ED showed 3.8 centimeter left breast mass with left axillary lymph nodes.Mamm and [place]: 4 cm mass with 5 axillary lymph nodes: Biopsy: Grade 2 IDC, lymph node: Positive, ER 100%, PR 50%, Ki-67 25%, HER2 negative ratio 1.25, copy #3.45   09/25/2021 Cancer Staging   Staging form: Breast, AJCC 8th Edition - Clinical: Stage IIA (cT2, cN1, cM0, G2, ER+, PR+, HER2-) - Signed by Nicholas Lose, MD on 09/25/2021 Histologic grading system: 3 grade system   10/03/2021 - 01/18/2022 Chemotherapy   Patient is on Treatment Plan : BREAST TC q21d     01/22/2022 Genetic Testing   Negative genetic testing on the CancerNext-Expanded+RNAinsight.  The report date is January 22, 2022.  The CancerNext-Expanded gene panel offered by [facility] and includes sequencing and rearrangement analysis for the following 77 genes: AIP, ALK, APC*, ATM*, AXIN2, BAP1, BARD1, BLM, BMPR1A, BRCA1*, BRCA2*, BRIP1*, CDC73, CDH1*, CDK4, CDKN1B, CDKN2A, CHEK2*, CTNNA1, DICER1, FANCC, FH, FLCN, GALNT12, KIF1B, LZTR1, MAX, MEN1, MET, MLH1*, MSH2*, MSH3, MSH6*, MUTYH*, NBN, NF1*, NF2, NTHL1, PALB2*, PHOX2B, PMS2*, POT1, PRKAR1A, PTCH1, PTEN*, RAD51C*, RAD51D*, RB1, RECQL, RET, SDHA, SDHAF2, SDHB, SDHC, SDHD, SMAD4, SMARCA4, SMARCB1, SMARCE1, STK11, SUFU, TMEM127, TP53*, TSC1, TSC2, VHL and XRCC2 (sequencing and deletion/duplication); EGFR, EGLN1, HOXB13, KIT, MITF, PDGFRA, POLD1,  and POLE (sequencing only); EPCAM and GREM1 (deletion/duplication only). DNA and RNA analyses performed for * genes.      CHIEF COMPLIANT: Follow-up left breast post radiation  INTERVAL HISTORY: Michael Stuart is a 41 y.o male with the above- mentioned left breast. He presents to the clinic today for a follow-up after radiation and discuss tamoxifen. He reports to the clinic today that the chest is painful. He states that fingers are getting better still some neuropathy in feet. He says energy level has gotten better.   ALLERGIES:  is allergic to bactrim [sulfamethoxazole-trimethoprim].  MEDICATIONS:  Current Outpatient Medications  Medication Sig Dispense Refill   [START ON 06/08/2022] tamoxifen (NOLVADEX) 20 MG tablet Take 1 tablet (20 mg total) by mouth daily. 90 tablet 3   acetaminophen (TYLENOL) 500 MG tablet Take 1,500 mg by mouth daily as needed for moderate pain or headache.     atorvastatin (LIPITOR) 20 MG tablet TAKE 1 TABLET BY MOUTH ONCE DAILY 30 tablet 3   dapagliflozin propanediol (FARXIGA) 10 MG TABS tablet Take 1 tablet by mouth daily. 90 tablet 2   eplerenone (INSPRA) 50 MG tablet Take 1 tablet by mouth daily. 90 tablet 3   metoprolol succinate (TOPROL-XL) 50 MG 24 hr tablet Take 1 tablet (50 mg total) by mouth daily. 90 tablet 3   sacubitril-valsartan (ENTRESTO) 49-51 MG Take 1 tablet by mouth 2 times daily. 180 tablet 3   No current facility-administered medications for this visit.    PHYSICAL EXAMINATION: ECOG PERFORMANCE STATUS: 1 - Symptomatic but completely ambulatory  Vitals:   05/15/22 0803  BP: (!) 150/97  Pulse: 95  Resp: 18  Temp: 97.8 F (  36.6 C)  SpO2: 99%   Filed Weights   05/15/22 0803  Weight: 269 lb 9.6 oz (122.3 kg)      LABORATORY DATA:  I have reviewed the data as listed    Latest Ref Rng & Units 02/22/2022    1:03 AM 02/13/2022    9:31 AM 01/16/2022   11:26 AM  CMP  Glucose 70 - 99 mg/dL 129  97  96   BUN 6 - 20 mg/dL _0 Creatinine 0.61 - 1.24 mg/dL 0.82  0.82  0.81   Sodium 135 - 145 mmol/L 135  138  139   Potassium 3.5 - 5.1 mmol/L 4.2  4.3  3.8   Chloride 98 - 111 mmol/L 103  105  107   CO2 22 - 32 mmol/L _1 Calcium 8.9 - 10.3 mg/dL 8.7  9.3  9.3   Total Protein 6.5 - 8.1 g/dL   7.1   Total Bilirubin 0.3 - 1.2 mg/dL   0.4   Alkaline Phos 38 - 126 U/L   56   AST 15 - 41 U/L   21   ALT 0 - 44 U/L   20     Lab Results  Component Value Date   WBC 15.3 (H) 02/22/2022   HGB 13.9 02/22/2022   HCT 41.6 02/22/2022   MCV 99.0 02/22/2022   PLT 196 02/22/2022   NEUTROABS 5.0 01/16/2022    ASSESSMENT & PLAN:  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 CT 08/27/2021 in ED showed 3.8 cm left breast mass with left axillary lymph nodes.Mamm and [place]: 4 cm mass with 5 axillary lymph nodes: Biopsy: Grade 2 IDC, lymph node: Positive, ER 100%, PR 50%, Ki-67 25%, HER2 negative ratio 1.25, copy #3.45 (Recent history of coronary artery disease EF 22% and stroke)   Treatment plan: 1. Neoadjuvant chemotherapy with Taxotere and Cytoxan every 3 weeks x6 cycles completed 01/16/2022 2. left mastectomy: Residual grade 2 IDC 3.5 cm, margins negative, 2/2 lymph nodes positive, angiolymphatic invasion present, ER 100%, PR 50%, HER2 negative, Ki-67 25% 3. Followed by adjuvant radiation therapy 04/03/22-05/16/22 4.  Followed by adjuvant antiestrogen therapy +/- CDK 4 and 6 inhibitor CT CAP and bone scan: No distant metastatic disease ---------------------------------------------------------------------------------------------------------------------------------- Tamoxifen counseling: We discussed the risks and benefits of tamoxifen. These include but not limited to insomnia, hot flashes, mood changes, vaginal dryness, and weight gain. Although rare, serious side effects including endometrial cancer, risk of blood clots  were also discussed. We strongly believe that the benefits far outweigh the risks. Patient understands these risks and consented to starting treatment. Planned treatment duration is 10 years.  Abemaciclib counseling: I discussed at length the risks and benefits of Abemaciclib in combination with letrozole. Adverse effects of Abemaciclib include decreasing neutrophil count, pneumonia, blood clots in lungs as well as nausea and GI symptoms. Side effects of letrozole include hot flashes, muscle aches and pains, uterine bleeding/spotting/cancer, osteoporosis, risk of blood clots.  Our plan is to start the patient on tamoxifen starting December 1 If he tolerates it well then we will start Verzinio February 2024. We also recommended Signatera testing. Scans will be done next year in May     Orders Placed This Encounter  Procedures   CT CHEST ABDOMEN PELVIS W CONTRAST    Standing Status:   Future    Standing Expiration Date:  05/16/2023    Order Specific Question:   Preferred imaging location?    Answer:   [facility]    Order Specific Question:   Release to patient    Answer:   Immediate    Order Specific Question:   Is Oral Contrast requested for this exam?    Answer:   Yes, Per Radiology protocol   The patient has a good understanding of the overall plan. he agrees with it. he will call with any problems that may develop before the next visit here. Total time spent: 30 mins including face to face time and time spent for planning, charting and co-ordination of care   Harriette Ohara, MD 05/15/22    I Gardiner Coins am scribing for Dr. Lindi Adie  I have reviewed the above documentation for accuracy and completeness, and I agree with the above.

## 2022-05-08 ENCOUNTER — Ambulatory Visit: Payer: BC Managed Care – PPO

## 2022-05-08 ENCOUNTER — Ambulatory Visit
Admission: RE | Admit: 2022-05-08 | Discharge: 2022-05-08 | Disposition: A | Discharge status: Home / Self Care | Payer: BC Managed Care – PPO | Source: Ambulatory Visit | Attending: Radiation Oncology | Admitting: Radiation Oncology

## 2022-05-08 ENCOUNTER — Other Ambulatory Visit: Payer: Self-pay

## 2022-05-08 DIAGNOSIS — C50422 Malignant neoplasm of upper-outer quadrant of left male breast: Secondary | ICD-10-CM | POA: Diagnosis not present

## 2022-05-08 DIAGNOSIS — C773 Secondary and unspecified malignant neoplasm of axilla and upper limb lymph nodes: Secondary | ICD-10-CM | POA: Diagnosis not present

## 2022-05-08 DIAGNOSIS — Z170 Estrogen receptor positive status [ER+]: Secondary | ICD-10-CM | POA: Diagnosis not present

## 2022-05-08 DIAGNOSIS — Z510 Encounter for antineoplastic radiation therapy: Secondary | ICD-10-CM | POA: Diagnosis not present

## 2022-05-08 LAB — RAD ONC ARIA SESSION SUMMARY
Course Elapsed Days: 36
Plan Fractions Treated to Date: 14
Plan Fractions Treated to Date: 27
Plan Prescribed Dose Per Fraction: 1.8 Gy
Plan Prescribed Dose Per Fraction: 1.8 Gy
Plan Total Fractions Prescribed: 14
Plan Total Fractions Prescribed: 28
Plan Total Prescribed Dose: 25.2 Gy
Plan Total Prescribed Dose: 50.4 Gy
Reference Point Dosage Given to Date: 48.6 Gy
Reference Point Dosage Given to Date: 48.6 Gy
Reference Point Session Dosage Given: 1.8 Gy
Reference Point Session Dosage Given: 1.8 Gy
Session Number: 27

## 2022-05-09 ENCOUNTER — Ambulatory Visit: Payer: BC Managed Care – PPO | Admitting: Radiation Oncology

## 2022-05-09 ENCOUNTER — Ambulatory Visit
Admission: RE | Admit: 2022-05-09 | Discharge: 2022-05-09 | Disposition: A | Discharge status: Home / Self Care | Payer: BC Managed Care – PPO | Source: Ambulatory Visit | Attending: Hematology and Oncology | Admitting: Hematology and Oncology

## 2022-05-09 ENCOUNTER — Other Ambulatory Visit: Payer: Self-pay

## 2022-05-09 ENCOUNTER — Ambulatory Visit: Payer: BC Managed Care – PPO

## 2022-05-09 DIAGNOSIS — C50422 Malignant neoplasm of upper-outer quadrant of left male breast: Secondary | ICD-10-CM | POA: Insufficient documentation

## 2022-05-09 DIAGNOSIS — C50922 Malignant neoplasm of unspecified site of left male breast: Secondary | ICD-10-CM | POA: Insufficient documentation

## 2022-05-09 DIAGNOSIS — Z170 Estrogen receptor positive status [ER+]: Secondary | ICD-10-CM | POA: Diagnosis not present

## 2022-05-09 DIAGNOSIS — Z510 Encounter for antineoplastic radiation therapy: Secondary | ICD-10-CM | POA: Insufficient documentation

## 2022-05-09 LAB — RAD ONC ARIA SESSION SUMMARY
Course Elapsed Days: 37
Plan Fractions Treated to Date: 14
Plan Fractions Treated to Date: 28
Plan Prescribed Dose Per Fraction: 1.8 Gy
Plan Prescribed Dose Per Fraction: 1.8 Gy
Plan Total Fractions Prescribed: 14
Plan Total Fractions Prescribed: 28
Plan Total Prescribed Dose: 25.2 Gy
Plan Total Prescribed Dose: 50.4 Gy
Reference Point Dosage Given to Date: 50.4 Gy
Reference Point Dosage Given to Date: 50.4 Gy
Reference Point Session Dosage Given: 1.8 Gy
Reference Point Session Dosage Given: 1.8 Gy
Session Number: 28

## 2022-05-10 ENCOUNTER — Ambulatory Visit
Admission: RE | Admit: 2022-05-10 | Discharge: 2022-05-10 | Disposition: A | Discharge status: Home / Self Care | Payer: BC Managed Care – PPO | Source: Ambulatory Visit | Attending: Radiation Oncology | Admitting: Radiation Oncology

## 2022-05-10 ENCOUNTER — Ambulatory Visit: Payer: BC Managed Care – PPO

## 2022-05-10 ENCOUNTER — Other Ambulatory Visit: Payer: Self-pay

## 2022-05-10 DIAGNOSIS — Z510 Encounter for antineoplastic radiation therapy: Secondary | ICD-10-CM | POA: Diagnosis not present

## 2022-05-10 DIAGNOSIS — Z170 Estrogen receptor positive status [ER+]: Secondary | ICD-10-CM | POA: Diagnosis not present

## 2022-05-10 DIAGNOSIS — C50422 Malignant neoplasm of upper-outer quadrant of left male breast: Secondary | ICD-10-CM | POA: Diagnosis not present

## 2022-05-10 LAB — RAD ONC ARIA SESSION SUMMARY
Course Elapsed Days: 38
Plan Fractions Treated to Date: 1
Plan Prescribed Dose Per Fraction: 2 Gy
Plan Total Fractions Prescribed: 5
Plan Total Prescribed Dose: 10 Gy
Reference Point Dosage Given to Date: 2 Gy
Reference Point Session Dosage Given: 2 Gy
Session Number: 29

## 2022-05-11 ENCOUNTER — Ambulatory Visit: Payer: BC Managed Care – PPO | Admitting: Cardiology

## 2022-05-11 ENCOUNTER — Other Ambulatory Visit: Payer: Self-pay

## 2022-05-11 ENCOUNTER — Ambulatory Visit: Payer: BC Managed Care – PPO

## 2022-05-11 ENCOUNTER — Ambulatory Visit
Admission: RE | Admit: 2022-05-11 | Discharge: 2022-05-11 | Disposition: A | Discharge status: Home / Self Care | Payer: BC Managed Care – PPO | Source: Ambulatory Visit | Attending: Radiation Oncology | Admitting: Radiation Oncology

## 2022-05-11 ENCOUNTER — Encounter: Payer: Self-pay | Admitting: Cardiology

## 2022-05-11 VITALS — BP 134/90 | HR 91 | Resp 16 | Ht 75.0 in | Wt 284.0 lb

## 2022-05-11 DIAGNOSIS — Z8673 Personal history of transient ischemic attack (TIA), and cerebral infarction without residual deficits: Secondary | ICD-10-CM

## 2022-05-11 DIAGNOSIS — Z170 Estrogen receptor positive status [ER+]: Secondary | ICD-10-CM

## 2022-05-11 DIAGNOSIS — I5022 Chronic systolic (congestive) heart failure: Secondary | ICD-10-CM

## 2022-05-11 DIAGNOSIS — C50422 Malignant neoplasm of upper-outer quadrant of left male breast: Secondary | ICD-10-CM | POA: Diagnosis not present

## 2022-05-11 DIAGNOSIS — C50922 Malignant neoplasm of unspecified site of left male breast: Secondary | ICD-10-CM | POA: Diagnosis not present

## 2022-05-11 DIAGNOSIS — Z510 Encounter for antineoplastic radiation therapy: Secondary | ICD-10-CM | POA: Diagnosis not present

## 2022-05-11 LAB — RAD ONC ARIA SESSION SUMMARY
Course Elapsed Days: 39
Plan Fractions Treated to Date: 2
Plan Prescribed Dose Per Fraction: 2 Gy
Plan Total Fractions Prescribed: 5
Plan Total Prescribed Dose: 10 Gy
Reference Point Dosage Given to Date: 4 Gy
Reference Point Session Dosage Given: 2 Gy
Session Number: 30

## 2022-05-11 MED ORDER — DAPAGLIFLOZIN PROPANEDIOL 10 MG PO TABS
10.0000 mg | ORAL_TABLET | Freq: Every day | ORAL | 2 refills | Status: DC
Start: 2022-05-11 — End: 2023-07-31

## 2022-05-11 MED ORDER — EPLERENONE 50 MG PO TABS
50.0000 mg | ORAL_TABLET | Freq: Every day | ORAL | 3 refills | Status: DC
Start: 2022-05-11 — End: 2023-09-23

## 2022-05-11 MED ORDER — RADIAPLEXRX EX GEL: Freq: Once | CUTANEOUS | Status: AC

## 2022-05-11 MED ORDER — METOPROLOL SUCCINATE ER 50 MG PO TB24: 50.0000 mg | ORAL_TABLET | Freq: Every day | ORAL | 3 refills | Status: DC

## 2022-05-11 MED ORDER — ENTRESTO 49-51 MG PO TABS
1.0000 | ORAL_TABLET | Freq: Two times a day (BID) | ORAL | 3 refills | Status: DC
Start: 2022-05-11 — End: 2022-06-11

## 2022-05-11 NOTE — Progress Notes (Signed)
Follow up visit  Subjective:   Michael Stuart, male    DOB: 10-Sep-1980, 41 y.o.   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 He underwent mastectomy in 02/2022, completed chemotherapy, will complete radiation therapy next week.     Current Outpatient Medications:    acetaminophen (TYLENOL) 500 MG tablet, Take 1,500 mg by mouth daily as needed for moderate pain or headache., Disp: , Rfl:    atorvastatin (LIPITOR) 20 MG tablet, TAKE 1 TABLET BY MOUTH ONCE DAILY, Disp: 30 tablet, Rfl: 3   dapagliflozin propanediol (FARXIGA) 10 MG TABS tablet, Take 1 tablet by mouth daily., Disp: 90 tablet, Rfl: 2   eplerenone (INSPRA) 50 MG tablet, Take 1 tablet by mouth daily., Disp: 90 tablet, Rfl: 3   metoprolol succinate (TOPROL-XL) 50 MG 24 hr tablet, Take 1 tablet (50 mg total) by mouth daily., Disp: 90 tablet, Rfl: 3   sacubitril-valsartan (ENTRESTO) 49-51 MG, Take 1 tablet by mouth 2 times daily., Disp: 60 tablet, Rfl: 3   Cardiovascular & other pertient studies:  Reviewed external labs and tests, independently interpreted  EKG 12/14/2021: Sinus tachycardia 114 bpm  Left atrial enlargement  Echocardiogram 11/21/2021:  Left ventricle cavity is normal in size. Moderate concentric hypertrophy  of the left ventricle. Normal global wall motion. Normal LV systolic  function with visual EF 50-55%. Doppler evidence of grade I (impaired)  diastolic dysfunction, normal LAP. No significant valvular abnormality.  Normal right atrial pressure.  Previous study on 08/27/2021 noted the following findings, which are now  absent:  Dilated LV/RV with LVEF 20-25%. LV apical thrombus. Dilated RA/RV.  Mild MR.   MRI brain 09/01/2021: Patchy acute and subacute infarcts as described. Largest area of involvement is in the left frontal lobe corresponding to abnormality on CT where there is some petechial hemorrhage. Involvement of multiple vascular territories suggesting a central source. No intracranial mass.  LHC/RHC 08/28/2021: Normal coronary arteries without coronary artery disease   RA: 10 mmHg RV: 49/4 mmHg PA: 44/24 mmHg, mPAP 34 mmHg PCW: 22 mmHg   CO: 5.7 L/min CI: 2.3 L/min/m2   Decompensated nonischemic cardiomyopathy Resume IV heparin 2 hours after TR band is off given LV apical thrombus GDMT for heart failure and continued workup for breast mass  Echocardiogram 08/27/2021: 1. Left ventricular ejection fraction, by estimation, is 20 to 25%. The  left ventricle has severely decreased function. The left ventricle  demonstrates global hypokinesis. The left ventricular internal cavity size  was dilated. Left ventricular diastolic   parameters are consistent with Grade III diastolic dysfunction  (restrictive). Elevated left ventricular end-diastolic pressure.   2. Right ventricular systolic function is hyperdynamic. The right  ventricular size is mildly enlarged. dilated.   3. Left atrial size was mild to moderately dilated.   4. Right atrial size was dilated.   5. A small pericardial effusion is present. The pericardial effusion is  posterior to the left ventricle.   6. The mitral valve is grossly normal. Mild mitral valve regurgitation.  No evidence of mitral stenosis.   7. The aortic valve is tricuspid. Aortic valve regurgitation is not  visualized.  No aortic stenosis is present.   8. There is mild dilatation of the ascending aorta, measuring 40 mm.   9. The inferior vena cava is dilated in size with >50% respiratory  variability, suggesting right atrial pressure of 8 mmHg.   Recent  labs: 02/22/2022: Glucose 129, BUN/Cr 14/0.82. EGFR >60. Na/K 135/4.2.  H/H 13/41. MCV 99. Platelets 196. WBC 15k  12/26/2021: Glucose 102, BUN/Cr 9/0.81. EGFR >60. Na/K 139/3.6. Rest of the CMP normal H/H 12/36. MCV 99. Platelets 339  Review of Systems  Constitutional: Positive for malaise/fatigue.  Cardiovascular:  Negative for chest pain, dyspnea on exertion, leg swelling, palpitations and syncope.         Vitals:   05/11/22 1129 05/11/22 1132  BP: (!) 133/91 (!) 134/90  Pulse: 83 91  Resp: 16   SpO2: 97%      Body mass index is 35.5 kg/m. Filed Weights   05/11/22 1129  Weight: 284 lb (128.8 kg)     Objective:   Physical Exam Vitals and nursing note reviewed.  Constitutional:      General: He is not in acute distress. Neck:     Vascular: No JVD.  Cardiovascular:     Rate and Rhythm: Normal rate and regular rhythm.     Heart sounds: Normal heart sounds. No murmur heard. Pulmonary:     Effort: Pulmonary effort is normal.     Breath sounds: Normal breath sounds. No wheezing or rales.  Musculoskeletal:     Right lower leg: No edema.     Left lower leg: No edema.             Visit diagnoses:   ICD-10-CM   1. Chronic systolic heart failure (HCC)  I50.22 PCV ECHOCARDIOGRAM COMPLETE    2. History of stroke  Z86.73     3. 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    Dispense:  180 tablet    Refill:  3   metoprolol succinate (TOPROL-XL) 50 MG 24 hr tablet    Sig: Take 1 tablet (50 mg total) by mouth daily.    Dispense:  90 tablet    Refill:  3   eplerenone (INSPRA) 50 MG tablet    Sig: Take 1 tablet by mouth daily.    Dispense:  90 tablet    Refill:   3   dapagliflozin propanediol (FARXIGA) 10 MG TABS tablet    Sig: Take 1 tablet by mouth daily.    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 H/o viral illness in December 2022. Clinically improving, now NYHA class I-II. Currently on Entresto 49-51 mg bid, eplerenone 50 mg daily, metoprolol succinate 50 mg daily, Farxiga 10 mg daily EF increased to 50-55% (11/2021).  With recent chemotherapy and radiation, one needs to be careful about monitoring his LVEF.  We will repeat echocardiogram with strain in 10/2022.  I anticipate that if he continues to do well from heart failure standpoint, we could consider down titrating his heart failure medications sometime in 2024, after he has completed chemotherapy, mastectomy, and radiation.  Cardioembolic stroke: Multifocal strokes secondary to LV apical thrombus, with mild petechial hemorrhage (08/2021), while on warfarin, thus on eliquis since then.  Patient completed 3 months of anticoagulation post stroke that occurred in the setting of systolic heart failure and LV thrombus. LVEF has now recovered. Anticoagulation discontinued (12/2021).  Breast cancer: Continue management as per Dr. Lindi Adie  F/u in 6 months    Nigel Mormon, MD Pager: [phone] Office: [phone]

## 2022-05-14 ENCOUNTER — Other Ambulatory Visit: Payer: Self-pay | Admitting: *Deleted

## 2022-05-14 ENCOUNTER — Ambulatory Visit
Admission: RE | Admit: 2022-05-14 | Discharge: 2022-05-14 | Disposition: A | Discharge status: Home / Self Care | Payer: BC Managed Care – PPO | Source: Ambulatory Visit | Attending: Radiation Oncology | Admitting: Radiation Oncology

## 2022-05-14 ENCOUNTER — Other Ambulatory Visit: Payer: Self-pay

## 2022-05-14 DIAGNOSIS — C50422 Malignant neoplasm of upper-outer quadrant of left male breast: Secondary | ICD-10-CM | POA: Diagnosis not present

## 2022-05-14 DIAGNOSIS — Z170 Estrogen receptor positive status [ER+]: Secondary | ICD-10-CM | POA: Diagnosis not present

## 2022-05-14 DIAGNOSIS — Z510 Encounter for antineoplastic radiation therapy: Secondary | ICD-10-CM | POA: Diagnosis not present

## 2022-05-14 LAB — RAD ONC ARIA SESSION SUMMARY
Course Elapsed Days: 42
Plan Fractions Treated to Date: 3
Plan Prescribed Dose Per Fraction: 2 Gy
Plan Total Fractions Prescribed: 5
Plan Total Prescribed Dose: 10 Gy
Reference Point Dosage Given to Date: 6 Gy
Reference Point Session Dosage Given: 2 Gy
Session Number: 31

## 2022-05-15 ENCOUNTER — Other Ambulatory Visit: Payer: Self-pay

## 2022-05-15 ENCOUNTER — Ambulatory Visit
Admission: RE | Admit: 2022-05-15 | Discharge: 2022-05-15 | Disposition: A | Discharge status: Home / Self Care | Payer: BC Managed Care – PPO | Source: Ambulatory Visit | Attending: Radiation Oncology | Admitting: Radiation Oncology

## 2022-05-15 ENCOUNTER — Inpatient Hospital Stay (HOSPITAL_BASED_OUTPATIENT_CLINIC_OR_DEPARTMENT_OTHER): Payer: BC Managed Care – PPO | Admitting: Hematology and Oncology

## 2022-05-15 VITALS — BP 150/97 | HR 95 | Temp 97.8°F | Resp 18 | Ht 75.0 in | Wt 269.6 lb

## 2022-05-15 DIAGNOSIS — Z7984 Long term (current) use of oral hypoglycemic drugs: Secondary | ICD-10-CM | POA: Insufficient documentation

## 2022-05-15 DIAGNOSIS — Z79810 Long term (current) use of selective estrogen receptor modulators (SERMs): Secondary | ICD-10-CM | POA: Insufficient documentation

## 2022-05-15 DIAGNOSIS — Z923 Personal history of irradiation: Secondary | ICD-10-CM | POA: Insufficient documentation

## 2022-05-15 DIAGNOSIS — Z79899 Other long term (current) drug therapy: Secondary | ICD-10-CM | POA: Insufficient documentation

## 2022-05-15 DIAGNOSIS — Z510 Encounter for antineoplastic radiation therapy: Secondary | ICD-10-CM | POA: Diagnosis not present

## 2022-05-15 DIAGNOSIS — Z9012 Acquired absence of left breast and nipple: Secondary | ICD-10-CM | POA: Insufficient documentation

## 2022-05-15 DIAGNOSIS — G629 Polyneuropathy, unspecified: Secondary | ICD-10-CM | POA: Insufficient documentation

## 2022-05-15 DIAGNOSIS — R079 Chest pain, unspecified: Secondary | ICD-10-CM | POA: Insufficient documentation

## 2022-05-15 DIAGNOSIS — Z170 Estrogen receptor positive status [ER+]: Secondary | ICD-10-CM | POA: Insufficient documentation

## 2022-05-15 DIAGNOSIS — C50422 Malignant neoplasm of upper-outer quadrant of left male breast: Secondary | ICD-10-CM | POA: Diagnosis not present

## 2022-05-15 DIAGNOSIS — Z9221 Personal history of antineoplastic chemotherapy: Secondary | ICD-10-CM | POA: Insufficient documentation

## 2022-05-15 LAB — RAD ONC ARIA SESSION SUMMARY
Course Elapsed Days: 43
Plan Fractions Treated to Date: 4
Plan Prescribed Dose Per Fraction: 2 Gy
Plan Total Fractions Prescribed: 5
Plan Total Prescribed Dose: 10 Gy
Reference Point Dosage Given to Date: 8 Gy
Reference Point Session Dosage Given: 2 Gy
Session Number: 32

## 2022-05-15 MED ORDER — TAMOXIFEN CITRATE 20 MG PO TABS: 20.0000 mg | ORAL_TABLET | Freq: Every day | ORAL | 3 refills | Status: DC

## 2022-05-15 NOTE — Assessment & Plan Note (Addendum)
09/15/2021:Palpable left breast mass x8 months. CT 08/27/2021 in ED showed 3.8 cm left breast mass with left axillary lymph nodes.Mamm and [place]: 4 cm mass with 5 axillary lymph nodes: Biopsy: Grade 2 IDC, lymph node: Positive, ER 100%, PR 50%, Ki-67 25%, HER2 negative ratio 1.25, copy #3.45 (Recent history of coronary artery disease EF 22% and stroke)   Treatment plan: 1. Neoadjuvant chemotherapy with Taxotere and Cytoxan every 3 weeks x6 cycles completed 01/16/2022 2. left mastectomy: Residual grade 2 IDC 3.5 cm, margins negative, 2/2 lymph nodes positive, angiolymphatic invasion present, ER 100%, PR 50%, HER2 negative, Ki-67 25% 3. Followed by adjuvant radiation therapy 04/03/22-05/16/22 4.  Followed by adjuvant antiestrogen therapy +/- CDK 4 and 6 inhibitor CT CAP and bone scan: No distant metastatic disease ---------------------------------------------------------------------------------------------------------------------------------- Tamoxifen counseling: We discussed the risks and benefits of tamoxifen. These include but not limited to insomnia, hot flashes, mood changes, vaginal dryness, and weight gain. Although rare, serious side effects including endometrial cancer, risk of blood clots were also discussed. We strongly believe that the benefits far outweigh the risks. Patient understands these risks and consented to starting treatment. Planned treatment duration is 10 years.  Abemaciclib counseling: I discussed at length the risks and benefits of Abemaciclib in combination with letrozole. Adverse effects of Abemaciclib include decreasing neutrophil count, pneumonia, blood clots in lungs as well as nausea and GI symptoms. Side effects of letrozole include hot flashes, muscle aches and pains, uterine bleeding/spotting/cancer, osteoporosis, risk of blood clots.  Our plan is to start the patient on tamoxifen starting December 1 If he tolerates it well then we will start Verzinio February 2024. We  also recommended Signatera testing. Scans will be done next year in May

## 2022-05-16 ENCOUNTER — Other Ambulatory Visit: Payer: Self-pay | Admitting: Radiation Oncology

## 2022-05-16 ENCOUNTER — Encounter: Payer: Self-pay | Admitting: Radiation Oncology

## 2022-05-16 ENCOUNTER — Encounter: Payer: Self-pay | Admitting: *Deleted

## 2022-05-16 ENCOUNTER — Other Ambulatory Visit: Payer: Self-pay

## 2022-05-16 ENCOUNTER — Ambulatory Visit
Admission: RE | Admit: 2022-05-16 | Discharge: 2022-05-16 | Disposition: A | Discharge status: Home / Self Care | Payer: BC Managed Care – PPO | Source: Ambulatory Visit | Attending: Radiation Oncology | Admitting: Radiation Oncology

## 2022-05-16 DIAGNOSIS — Z170 Estrogen receptor positive status [ER+]: Secondary | ICD-10-CM

## 2022-05-16 DIAGNOSIS — C50422 Malignant neoplasm of upper-outer quadrant of left male breast: Secondary | ICD-10-CM | POA: Diagnosis not present

## 2022-05-16 DIAGNOSIS — Z510 Encounter for antineoplastic radiation therapy: Secondary | ICD-10-CM | POA: Diagnosis not present

## 2022-05-16 LAB — RAD ONC ARIA SESSION SUMMARY
Course Elapsed Days: 44
Plan Fractions Treated to Date: 5
Plan Prescribed Dose Per Fraction: 2 Gy
Plan Total Fractions Prescribed: 5
Plan Total Prescribed Dose: 10 Gy
Reference Point Dosage Given to Date: 10 Gy
Reference Point Session Dosage Given: 2 Gy
Session Number: 33

## 2022-05-16 NOTE — Progress Notes (Signed)
Orders entered for signatera testing per MD. Requisition and all supporting documents faxed to [phone] with fax confirmation.

## 2022-05-22 ENCOUNTER — Encounter: Payer: Self-pay | Admitting: Hematology and Oncology

## 2022-05-22 NOTE — Progress Notes (Signed)
                                                                                                                                                             Patient Name: Michael Stuart MRN: [id number] DOB: [birth year] Referring Physician: Nicholas Lose (Profile Not Attached) Date of Service: 05/16/2022 [facility], [place]                                                        End Of Treatment Note  Diagnoses: C50.422-Malignant neoplasm of upper-outer quadrant of left male breast  Cancer Staging: Stage IIA, cT2N1M0 grade 2, ER/PR positive invasive ductal carcinoma of the left breast.   Intent: Curative  Radiation Treatment Dates: 04/02/2022 through 05/16/2022 Site Technique Total Dose (Gy) Dose per Fx (Gy) Completed Fx Beam Energies  Chest Wall, Left: CW_L 3D 50.4/50.4 1.8 28/28 15X, 10X  Chest Wall, Left: CW_L_SCLV 3D 50.4/50.4 1.8 28/28 6X, 15X  Chest Wall, Left: CW_L_Bst specialPort 10/10 2 5/5 9E   Narrative: The patient tolerated radiation therapy relatively well. He developed fatigue and anticipated skin changes in the treatment field, and had dry desquamation at the conclusion of therapy. He was referred to physical therapy as well due to axillary tightness of the skin and changes in ROM.   Plan: The patient will receive a call in about one month from the radiation oncology department. He will continue follow up with Dr. Lindi Adie as well.   ________________________________________________    Carola Rhine, [facility]

## 2022-05-28 DIAGNOSIS — C50422 Malignant neoplasm of upper-outer quadrant of left male breast: Secondary | ICD-10-CM | POA: Diagnosis not present

## 2022-05-28 DIAGNOSIS — Z170 Estrogen receptor positive status [ER+]: Secondary | ICD-10-CM | POA: Diagnosis not present

## 2022-06-05 ENCOUNTER — Ambulatory Visit: Payer: BC Managed Care – PPO | Attending: Hematology and Oncology | Admitting: Rehabilitation

## 2022-06-05 ENCOUNTER — Encounter: Payer: Self-pay | Admitting: Rehabilitation

## 2022-06-05 DIAGNOSIS — L599 Disorder of the skin and subcutaneous tissue related to radiation, unspecified: Secondary | ICD-10-CM | POA: Insufficient documentation

## 2022-06-05 DIAGNOSIS — R293 Abnormal posture: Secondary | ICD-10-CM | POA: Insufficient documentation

## 2022-06-05 DIAGNOSIS — C50422 Malignant neoplasm of upper-outer quadrant of left male breast: Secondary | ICD-10-CM | POA: Diagnosis not present

## 2022-06-05 DIAGNOSIS — Z170 Estrogen receptor positive status [ER+]: Secondary | ICD-10-CM | POA: Diagnosis not present

## 2022-06-05 DIAGNOSIS — Z483 Aftercare following surgery for neoplasm: Secondary | ICD-10-CM | POA: Diagnosis not present

## 2022-06-05 NOTE — Therapy (Signed)
OUTPATIENT PHYSICAL THERAPY BREAST CANCER POST OP FOLLOW UP   Patient Name: Michael Stuart MRN: [id number] DOB:19-Sep-1980, 41 y.o., male Today's Date: 06/05/2022  END OF SESSION:  PT End of Session - 06/05/22 0831     Visit Number 2    Number of Visits 2    PT Start Time 0800    PT Stop Time 0830    PT Time Calculation (min) 30 min    Activity Tolerance Patient tolerated treatment well    Behavior During Therapy WFL for tasks assessed/performed             Past Medical History:  Diagnosis Date   CHF (congestive heart failure) ([place])    Myocardial infarction ([place])    Pneumonia    Past Surgical History:  Procedure Laterality Date   IR IMAGING GUIDED PORT INSERTION  09/28/2021   MASTECTOMY W/ SENTINEL NODE BIOPSY Left 02/21/2022   Procedure: LEFT SIMPLE MASTECTOMY WITH LEFT TARGETED LYMPH NODE BIOPSY;  Surgeon: Erroll Luna, MD;  Location: [facility];  Service: General;  Laterality: Left;  GEN w/ PEC BLOCK   PORT-A-CATH REMOVAL Right 02/21/2022   Procedure: PORT REMOVAL;  Surgeon: Erroll Luna, MD;  Location: [facility];  Service: General;  Laterality: Right;   RIGHT/LEFT HEART CATH AND CORONARY ANGIOGRAPHY N/A 08/28/2021   Procedure: RIGHT/LEFT HEART CATH AND CORONARY ANGIOGRAPHY;  Surgeon: Nigel Mormon, MD;  Location: [facility];  Service: Cardiovascular;  Laterality: N/A;   Patient Active Problem List   Diagnosis Date Noted   Breast cancer, stage 3, left ([place]) 02/21/2022   Genetic testing 01/22/2022   LV (left ventricular) mural thrombus 12/13/2021   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, estrogen receptor positive ([place]) 12/13/2021   Port-A-Cath in place 10/03/2021   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, estrogen receptor positive ([place]) 09/25/2021   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  I feel like the shoulder is good now.  My toes and fingertips are numb.    PERTINENT HISTORY:  Pt palpated a left breast mass at 1 oclock for 1 year.  He had a severe upper respiratory infection and subsequently he was not feeling well.  He was diagnosed with acute congestive heart failure with an EF of 22%.  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  He was discharged home and then suddenly felt numbness on his face and on further evaluation was found to have a stroke. CT scans done in the emergency room on 08/27/2021 revealed 3.8 cm left breast mass with enlarged left axillary lymph nodes.  Mammogram confirmed that the mass to be 4 cm with 5 enlarged lymph nodes.  He underwent a biopsy of the mass and the  lymph node which came back as ER/PR positive HER2 negative invasive ductal carcinoma grade 2. Ki67 25%. Completed chemotherapy. Left mastectomy on 02/21/22 with 4/5 positive lymph nodes. Completed radiation 05/16/22. No metastatic disease. Will start tamoxifen and Verzinio.   PATIENT GOALS:  Reassess how my recovery is going related to arm function, pain, and swelling.  PAIN:  Are you having pain? Not really   PRECAUTIONS: Recent Surgery, left UE Lymphedema risk  ACTIVITY LEVEL / LEISURE: back to work and to walking    OBJECTIVE:  PATIENT SURVEYS:  QUICK DASH: From 0% to 0%   OBSERVATIONS: Incision well healed but dry - education on self scar work and moisturizing as well as scar massage education   POSTURE:  WNL  LYMPHEDEMA ASSESSMENT:   UPPER EXTREMITY AROM/PROM:   A/PROM RIGHT  10/02/2021    Shoulder extension 82  Shoulder flexion 180  Shoulder abduction 180  Shoulder internal rotation 53  Shoulder external rotation 100                          (Blank rows = not tested)   A/PROM LEFT  10/02/2021 06/05/22  Shoulder extension 75 50  Shoulder flexion 181 178  Shoulder abduction 184 178  Shoulder internal rotation 59   Shoulder external rotation 85 90                          (Blank rows = not tested)     LYMPHEDEMA ASSESSMENTS:    LANDMARK RIGHT  10/02/2021  10 cm proximal to olecranon process 34.6  Olecranon process 29.6  10 cm proximal to ulnar styloid process 26.5  Just proximal to ulnar styloid process 18.5  Across hand at thumb web space 24.7  At base of 2nd digit 7  (Blank rows = not tested)   LANDMARK LEFT  10/02/2021  10 cm proximal to olecranon process 35.7  Olecranon process 30.9  10 cm proximal to ulnar styloid process 25.5  Just proximal to ulnar styloid process 17.9  Across hand at thumb web space 24  At base of 2nd digit 7  (Blank rows = not tested)   L-DEX FLOWSHEETS - 06/05/22 0800       L-DEX LYMPHEDEMA SCREENING   Measurement Type  Unilateral    L-DEX MEASUREMENT EXTREMITY Upper Extremity    POSITION  Standing    DOMINANT SIDE Right    At Risk Side Left    BASELINE SCORE (UNILATERAL) 3.3  L-DEX SCORE (UNILATERAL) 2.3    VALUE CHANGE (UNILAT) -1               PATIENT EDUCATION:  Education details: scar care and massage, doorway stretch, healing timeline from radiation and chemo.  Person educated: Patient Education method: Explanation Education comprehension: verbalized understanding  HOME EXERCISE PROGRAM: Reviewed previously given post op HEP. Added doorway stretch and lat stretch  ASSESSMENT:  CLINICAL IMPRESSION: Pt is doing very well now post chemo, radiation, and mastectomy.  He has returned to baseline AROM but has a risk of shoulder pain and stiffness due to medical hx so he was educated on return to gym, watching for swelling, and SOZO. Denies any balance deficits despite CIPN.   Pt will benefit from skilled therapeutic intervention to improve on the following deficits: Decreased knowledge of precautions, impaired UE functional use, pain, decreased ROM, postural dysfunction.   PT treatment/interventions: ADL/Self care home management, Patient/Family education   GOALS: Goals reviewed with patient? Yes  LONG TERM GOALS:  (STG=LTG)  GOALS Name Target Date  Goal status  1 Pt will demonstrate full shoulder ROM and function post operatively compared to baselines.  Baseline: 06/05/22 MET                    PLAN:  PT FREQUENCY/DURATION: SOZO only  PLAN FOR NEXT SESSION: SOZO   [facility]  [address]  [place] [address]  [phone]  After Breast Cancer Class It is recommended you attend the ABC class to be educated on lymphedema risk reduction. This class is free of charge and lasts for 1 hour. It is a 1-time class. You will need to download the Webex app either on your phone or computer. We will send you a link the night before or the  morning of the class. You should be able to click on that link to join the class. This is not a confidential class. You don't have to turn your camera on, but other participants may be able to see your email address.  Scar massage You can begin gentle scar massage to you incision sites. Gently place one hand on the incision and move the skin (without sliding on the skin) in various directions. Do this for a few minutes and then you can gently massage either coconut oil or vitamin E cream into the scars.  Home exercise Program Continue doing the exercises you were given until you feel like you can do them without feeling any tightness at the end.   Walking Program Studies show that 30 minutes of walking per day (fast enough to elevate your heart rate) can significantly reduce the risk of a cancer recurrence. If you can't walk due to other medical reasons, we encourage you to find another activity you could do (like a stationary bike or water exercise).  Posture After breast cancer surgery, people frequently sit with rounded shoulders posture because it puts their incisions on slack and feels better. If you sit like this and scar tissue forms in that position, you can become very tight and have pain sitting or standing with good posture. Try to be aware of your posture and sit and stand up tall to heal properly.  Follow up PT: It is recommended you return every 3 months for the first 3 years following surgery to be assessed on the SOZO machine for an L-Dex score. This helps prevent clinically significant lymphedema in 95% of patients. These follow up screens  are 10 minute appointments that you are not billed for.  Stark Bray, PT 06/05/2022, 8:35 AM

## 2022-06-11 ENCOUNTER — Encounter (HOSPITAL_COMMUNITY): Payer: Self-pay | Admitting: Cardiology

## 2022-06-11 ENCOUNTER — Ambulatory Visit (HOSPITAL_COMMUNITY)
Admission: RE | Admit: 2022-06-11 | Discharge: 2022-06-11 | Disposition: A | Discharge status: Home / Self Care | Payer: BC Managed Care – PPO | Source: Ambulatory Visit | Attending: Cardiology | Admitting: Cardiology

## 2022-06-11 VITALS — BP 142/80 | HR 97 | Wt 280.8 lb

## 2022-06-11 DIAGNOSIS — E7800 Pure hypercholesterolemia, unspecified: Secondary | ICD-10-CM | POA: Diagnosis not present

## 2022-06-11 DIAGNOSIS — Z79899 Other long term (current) drug therapy: Secondary | ICD-10-CM | POA: Insufficient documentation

## 2022-06-11 DIAGNOSIS — Z79810 Long term (current) use of selective estrogen receptor modulators (SERMs): Secondary | ICD-10-CM | POA: Diagnosis not present

## 2022-06-11 DIAGNOSIS — Z7984 Long term (current) use of oral hypoglycemic drugs: Secondary | ICD-10-CM | POA: Insufficient documentation

## 2022-06-11 DIAGNOSIS — Z8673 Personal history of transient ischemic attack (TIA), and cerebral infarction without residual deficits: Secondary | ICD-10-CM | POA: Insufficient documentation

## 2022-06-11 DIAGNOSIS — R030 Elevated blood-pressure reading, without diagnosis of hypertension: Secondary | ICD-10-CM | POA: Diagnosis not present

## 2022-06-11 DIAGNOSIS — Z9012 Acquired absence of left breast and nipple: Secondary | ICD-10-CM | POA: Diagnosis not present

## 2022-06-11 DIAGNOSIS — Z87891 Personal history of nicotine dependence: Secondary | ICD-10-CM | POA: Diagnosis not present

## 2022-06-11 DIAGNOSIS — Z853 Personal history of malignant neoplasm of breast: Secondary | ICD-10-CM | POA: Diagnosis not present

## 2022-06-11 DIAGNOSIS — Z923 Personal history of irradiation: Secondary | ICD-10-CM | POA: Diagnosis not present

## 2022-06-11 DIAGNOSIS — I5022 Chronic systolic (congestive) heart failure: Secondary | ICD-10-CM | POA: Diagnosis not present

## 2022-06-11 DIAGNOSIS — Z7901 Long term (current) use of anticoagulants: Secondary | ICD-10-CM | POA: Insufficient documentation

## 2022-06-11 LAB — LIPID PANEL
Cholesterol: 157 mg/dL (ref 0–200)
HDL: 64 mg/dL (ref >40)
LDL Cholesterol: 52 mg/dL (ref 0–99)
Total CHOL/HDL Ratio: 2.5 RATIO
Triglycerides: 204 mg/dL — ABNORMAL HIGH (ref <150)
VLDL: 41 mg/dL — ABNORMAL HIGH (ref 0–40)

## 2022-06-11 LAB — BASIC METABOLIC PANEL
Anion gap: 5 (ref 5–15)
BUN: 14 mg/dL (ref 6–20)
CO2: 25 mmol/L (ref 22–32)
Calcium: 8.8 mg/dL — ABNORMAL LOW (ref 8.9–10.3)
Chloride: 106 mmol/L (ref 98–111)
Creatinine, Ser: 1.03 mg/dL (ref 0.61–1.24)
GFR, Estimated: >60 mL/min (ref >60)
Glucose, Bld: 105 mg/dL — ABNORMAL HIGH (ref 70–99)
Potassium: 4.7 mmol/L (ref 3.5–5.1)
Sodium: 136 mmol/L (ref 135–145)

## 2022-06-11 MED ORDER — ENTRESTO 97-103 MG PO TABS: 1.0000 | ORAL_TABLET | Freq: Two times a day (BID) | ORAL | 11 refills | Status: DC

## 2022-06-11 NOTE — Patient Instructions (Signed)
CHANGE Entresto to 97/103 mg Twice daily  Labs done today, your results will be available in MyChart, we will contact you for abnormal readings.  Repeat blood work in 10 days.  Your physician has requested that you have an echocardiogram. Echocardiography is a painless test that uses sound waves to create images of your heart. It provides your doctor with information about the size and shape of your heart and how well your heart's chambers and valves are working. This procedure takes approximately one hour. There are no restrictions for this procedure. Please do NOT wear cologne, perfume, aftershave, or lotions (deodorant is allowed). Please arrive 15 minutes prior to your appointment time.  Your physician recommends that you schedule a follow-up appointment in: 5 months ( May 2024)  ** please call the office in February to arrange your follow up appointment **  If you have any questions or concerns before your next appointment please send [place] a message through [facility] or call our office at [phone].    TO LEAVE A MESSAGE FOR THE NURSE SELECT OPTION 2, PLEASE LEAVE A MESSAGE INCLUDING: YOUR NAME DATE OF BIRTH CALL BACK NUMBER REASON FOR CALL**this is important as we prioritize the call backs  YOU WILL RECEIVE A CALL BACK THE SAME DAY AS LONG AS YOU CALL BEFORE 4:00 PM  At the [facility], you and your health needs are our priority. As part of our continuing mission to provide you with exceptional heart care, we have created designated Provider Care Teams. These Care Teams include your primary Cardiologist (physician) and Advanced Practice Providers (APPs- Physician Assistants and Nurse Practitioners) who all work together to provide you with the care you need, when you need it.   You may see any of the following providers on your designated Care Team at your next follow up: Dr Glori Bickers Dr Loralie Champagne Dr. Roxana Hires, NP Lyda Jester,  [facility] [place], [place] Forestine Na, NP Audry Riles, PharmD   Please be sure to bring in all your medications bottles to every appointment.

## 2022-06-11 NOTE — Progress Notes (Signed)
ADVANCED HF CLINIC NOTE Primary Care: Pcp, No HF Cardiologist: Dr. Aundra Dubin  HPI: Michael Stuart is a 40 y.o. male with minimal previous past medical history, and diagnosis of systolic heart failure in 2/23 with LV thrombus and new diagnosis of breast cancer.  Admitted 6/29 with new systolic heart failure. Echo showed dilated LV with EF 20-25%.  LHC/RHC was done, showing elevated filling pressures but relatively preserved cardiac index of 2.3.  There was no significant coronary disease. cMRI showed LV dilation with EF 10%, LV thrombus, mildly dilated RV with EF 15%.  Delayed enhancement imaging showed inferolateral LGE that could be a OM territory infarct, but corresponding lesion not seen by cath. Most likely acute myocarditis given subepicardial apical inferior LGE also (the RV insertion site LGE can be nonspecific in the setting of volume/pressure overload). Elevated T2 in the apical inferior wall was more suggestive of acute myocarditis. 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 He was started on Coumadin for thrombus. He had transient LOC after MRI and was orthostatic, felt to be vagally-mediated. Also noted to have breast mass and new axillary lymphadenopathy, suspect breast cancer. Patient was diuresed and started on GDMT.  He was discharged with LifeVest with oncology follow up.  Readmitted 1 day after discharge with stroke-like symptoms of new left facial numbness, left facial drop and vision changes. INR 1.5 on admission, patient compliant with Lovenox bridging since discharge. CT head negative, MRI showed patchy acute and subacute infarcts. Neurology consulted and recommended transitioning to [place] given stroke in spite of being on warfarin/Lovenox. Deficits resolved and he was discharged home, weight 254 lbs.  He had left mastectomy for breast cancer in 8/23 and has completed taxotere/Cytoxan chemotherapy.  He will be starting radiation soon.   Echo was done in 5/23, this showed EF 50-55%, mild MR, no  LV thrombus. Anticoagulation was stopped with improvement in LV function.   Today, he returns for HF follow up.  He has completed radiation for breast cancer and is on tamoxifen.  He is starting to get his energy back.  He is working full time.  No exertional dyspnea or chest pain.  Tolerating all his meds, no lightheadedness.  BP is elevated, generally runs in 130s.   Labs (2/23): K 4.9, creatinine 0.96, hgb 16.2 Labs (4/23): K 4.0, creatinine 0.80, hgb 13.5 Labs (8/23): K 4.2, creatinine 0.82  PMH: 1. 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  s/p left mastectomy in 8/23 with chemotherapy with taxotere/Cytoxan and radiation.  2. 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   - Echo (2/23): dilated LV with EF 20-25%. - R/LHC (2/23): Elevated filling pressures but relatively preserved cardiac index of 2.3.  There was no significant coronary disease.  - cMRI (2/23): LV dilation with LVEF 10%, LV thrombus, mildly dilated RV with RVEF 15%.  Delayed enhancement imaging showed inferolateral LGE that could be a OM territory infarct, but corresponding lesion not seen by cath. Most likely acute myocarditis given subepicardial apical inferior LGE also (the RV insertion site LGE can be nonspecific in the setting of volume/pressure overload). Elevated T2 in the apical inferior wall is more suggestive of acute myocarditis. - Echo (5/23): EF 50-55%, mild MR, no LV thrombus 3. LV thrombus: Resolved on 5/23 echo.  4. CVA: 2/23, in setting of LV thrombus.     Current Outpatient Medications  Medication Sig Dispense Refill   acetaminophen (TYLENOL) 500 MG tablet Take 1,500 mg by mouth daily as needed for moderate pain or headache.  atorvastatin (LIPITOR) 20 MG tablet TAKE 1 TABLET BY MOUTH ONCE DAILY 30 tablet 3   dapagliflozin propanediol (FARXIGA) 10 MG TABS tablet Take 1 tablet by mouth daily. 90 tablet 2   eplerenone (INSPRA) 50 MG tablet Take 1 tablet by mouth daily. 90  tablet 3   metoprolol succinate (TOPROL-XL) 50 MG 24 hr tablet Take 1 tablet (50 mg total) by mouth daily. 90 tablet 3   sacubitril-valsartan (ENTRESTO) 97-103 MG Take 1 tablet by mouth 2 (two) times daily. 60 tablet 11   tamoxifen (NOLVADEX) 20 MG tablet Take 1 tablet (20 mg total) by mouth daily. 90 tablet 3   No current facility-administered medications for this encounter.   Allergies  Allergen Reactions   Bactrim [Sulfamethoxazole-Trimethoprim] Other (See Comments)    Blood Capillaries Rupture    Social History   Socioeconomic History   Marital status: Married    Spouse name: Not on file   Number of children: 2   Years of education: Not on file   Highest education level: Bachelor's degree (e.g., BA, AB, BS)  Occupational History   Occupation: crash Insurance underwriter    Employer: [employer]  Tobacco Use   Smoking status: Former    Packs/day: 0.25    Years: 25.00    Total pack years: 6.25    Types: Cigarettes    Quit date: 2022    Years since quitting: 1.9   Smokeless tobacco: Current   Tobacco comments:    Smoked 2PPD x 10 years. Last 13 years 3-4 cigarettes/day.   Vaping Use   Vaping Use: Former   Substances: Nicotine  Substance and Sexual Activity   Alcohol use: Yes    Alcohol/week: 21.0 standard drinks of alcohol    Types: 21 Cans of beer per week    Comment: 2 tall boys per day, everyday x 20years   Drug use: Not Currently    Frequency: 1.0 times per week    Types: Marijuana    Comment: 3x per month   Sexual activity: Not Currently  Other Topics Concern   Not on file  Social History Narrative   Right handed    Social Determinants of Health   Financial Resource Strain: Low Risk  (08/29/2021)   Overall Financial Resource Strain (CARDIA)    Difficulty of Paying Living Expenses: Not very hard  Food Insecurity: No Food Insecurity (08/29/2021)   Hunger Vital Sign    Worried About Running Out of Food in the Last Year: Never true    Ran Out of Food in the Last Year:  Never true  Transportation Needs: No Transportation Needs (08/29/2021)   PRAPARE - Hydrologist (Medical): No    Lack of Transportation (Non-Medical): No  Physical Activity: Not on file  Stress: Not on file  Social Connections: Not on file  Intimate Partner Violence: Not on file   Family History  Problem Relation Age of Onset   Healthy Mother    BP (!) 142/80   Pulse 97   Wt 127.4 kg (280 lb 12.8 oz)   SpO2 97%   BMI 35.10 kg/m   Wt Readings from Last 3 Encounters:  06/11/22 127.4 kg (280 lb 12.8 oz)  05/15/22 122.3 kg (269 lb 9.6 oz)  05/11/22 128.8 kg (284 lb)   PHYSICAL EXAM: General: NAD Neck: No JVD, no thyromegaly or thyroid nodule.  Lungs: Clear to auscultation bilaterally with normal respiratory effort. CV: Nondisplaced PMI.  Heart regular S1/S2, no S3/S4, no murmur.  No peripheral edema.  No carotid bruit.  Normal pedal pulses.  Abdomen: Soft, nontender, no hepatosplenomegaly, no distention.  Skin: Intact without lesions or rashes.  Neurologic: Alert and oriented x 3.  Psych: Normal affect. Extremities: No clubbing or cyanosis.  HEENT: Normal.   ASSESSMENT & PLAN: 1. Chronic Systolic CHF: Patient presented in 2/23 with CHF after a viral-type illness. HIV, TSH, COVID-19 test, and UDS were all negative. I do not think he was drinking enough ETOH to cause CMP. Patient has no family history of cardiomyopathy.  LHC/RHC showed no significant CAD, elevated filling pressures and relatively preserved cardiac output. Echo in 2/23 showed EF 20-25%. Cardiac MRI in 2/23 showed severe LV dilation with EF 10%, LV thrombus, mildly dilated RV with EF 15%.  Delayed enhancement imaging showed inferolateral LGE that could be a OM territory infarct, but corresponding lesion not seen by cath. Overall, most likely acute myocarditis given subepicardial apical inferior LGE also (the RV insertion site LGE can be nonspecific in the setting of volume/pressure overload).  Elevated T2 in the apical inferior wall was more suggestive of acute myocarditis. 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 Echo in 5/23 showed EF up to 50-55%.  He is not volume overloaded on exam, NYHA class I.   - Will arrange repeat echo in 5/24.  - With elevated BP, increase Entresto to 97/103 bid.  BMET today and in 10 days.  - Continue Toprol XL 50 mg daily.   - Continue Farxiga 10 mg daily. - Continue eplerenone 50 mg daily.  2. LV thrombus: With recovery of EF, anticoagulation was stopped.  3. 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  Transitioned to Eliquis.  Anticoagulation stopped after 5/23 echo showed improved LV function.  4. Breast Cancer: Left breast CA, s/p mastectomy in 8/23.  He has completed course of taxotere and cyotoxan as well as radiation.    Followup in 5/24 with echo.  He will need a BMET in 3 months.   Loralie Champagne. 06/11/2022

## 2022-06-14 ENCOUNTER — Encounter (HOSPITAL_COMMUNITY): Payer: Self-pay

## 2022-06-17 LAB — SIGNATERA ONLY (NATERA MANAGED)
SIGNATERA MTM READOUT: 0 MTM/ml
SIGNATERA TEST RESULT: NEGATIVE

## 2022-06-19 ENCOUNTER — Encounter: Payer: Self-pay | Admitting: *Deleted

## 2022-06-20 ENCOUNTER — Encounter: Payer: Self-pay | Admitting: Hematology and Oncology

## 2022-06-20 NOTE — Telephone Encounter (Signed)
Called pt per MD to advise Signatera testing was negative/not detected. Pt verbalized understanding of results and knows [facility] will be in touch to schedule 3 mo repeat lab.   

## 2022-06-25 ENCOUNTER — Encounter: Payer: Self-pay | Admitting: Hematology and Oncology

## 2022-06-26 ENCOUNTER — Ambulatory Visit
Admission: RE | Admit: 2022-06-26 | Discharge: 2022-06-26 | Disposition: A | Discharge status: Home / Self Care | Payer: BC Managed Care – PPO | Source: Ambulatory Visit | Attending: Hematology and Oncology | Admitting: Hematology and Oncology

## 2022-06-26 NOTE — Progress Notes (Signed)
  Radiation Oncology         [phone] ________________________________  Name: Michael Stuart MRN: [id number]  Date of Service: 06/26/2022  DOB: 1980-11-01  Post Treatment Telephone Note  Diagnosis:  Stage IIA, cT2N1M0 grade 2, ER/PR positive invasive ductal carcinoma of the left breast.   Intent: Curative  Radiation Treatment Dates: 04/02/2022 through 05/16/2022 Site Technique Total Dose (Gy) Dose per Fx (Gy) Completed Fx Beam Energies  Chest Wall, Left: CW_L 3D 50.4/50.4 1.8 28/28 15X, 10X  Chest Wall, Left: CW_L_SCLV 3D 50.4/50.4 1.8 28/28 6X, 15X  Chest Wall, Left: CW_L_Bst specialPort 10/10 2 5/5 9E   (as documented in provider EOT note)   The patient was available for call today.   Symptoms of fatigue have improved since completing therapy.  Symptoms of skin changes have improved since completing therapy.  The patient was encouraged to avoid sun exposure in the area of prior treatment for up to one year following radiation with either sunscreen or by the style of clothing worn in the sun.  The patient has scheduled follow up with her medical oncologist Dr. Lindi Adie for ongoing surveillance, and was encouraged to call if she develops concerns or questions regarding radiation.This concludes the interview.   Leandra Kern, LPN

## 2022-08-22 ENCOUNTER — Encounter: Payer: Self-pay | Admitting: *Deleted

## 2022-08-22 NOTE — Progress Notes (Signed)
Receive a message from [facility] representative stating patient did not respond to their call to schedule mobile phlebotomy draw.  Representative states patient will need to reach out to [place] directly at [phone] option #1 ext [phone] to schedule if patient wishes to proceed.

## 2022-08-28 NOTE — Progress Notes (Incomplete)
Patient Care Team: Pcp, No as PCP - General Mauro Kaufmann, RN as Oncology Nurse Navigator Rockwell Germany, RN as Oncology Nurse Navigator Elease Hashimoto (Neurology)  DIAGNOSIS: No diagnosis found.  SUMMARY OF ONCOLOGIC HISTORY: Oncology History  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, estrogen receptor positive ([place])  09/15/2021 Initial Diagnosis   Palpable left breast mass x8 months. CT 08/27/2021 in ED showed 3.8 centimeter left breast mass with left axillary lymph nodes.Mamm and [place]: 4 cm mass with 5 axillary lymph nodes: Biopsy: Grade 2 IDC, lymph node: Positive, ER 100%, PR 50%, Ki-67 25%, HER2 negative ratio 1.25, copy #3.45   09/25/2021 Cancer Staging   Staging form: Breast, AJCC 8th Edition - Clinical: Stage IIA (cT2, cN1, cM0, G2, ER+, PR+, HER2-) - Signed by Nicholas Lose, MD on 09/25/2021 Histologic grading system: 3 grade system   10/03/2021 - 01/18/2022 Chemotherapy   Patient is on Treatment Plan : BREAST TC q21d     01/22/2022 Genetic Testing   Negative genetic testing on the CancerNext-Expanded+RNAinsight.  The report date is January 22, 2022.  The CancerNext-Expanded gene panel offered by [facility] and includes sequencing and rearrangement analysis for the following 77 genes: AIP, ALK, APC*, ATM*, AXIN2, BAP1, BARD1, BLM, BMPR1A, BRCA1*, BRCA2*, BRIP1*, CDC73, CDH1*, CDK4, CDKN1B, CDKN2A, CHEK2*, CTNNA1, DICER1, FANCC, FH, FLCN, GALNT12, KIF1B, LZTR1, MAX, MEN1, MET, MLH1*, MSH2*, MSH3, MSH6*, MUTYH*, NBN, NF1*, NF2, NTHL1, PALB2*, PHOX2B, PMS2*, POT1, PRKAR1A, PTCH1, PTEN*, RAD51C*, RAD51D*, RB1, RECQL, RET, SDHA, SDHAF2, SDHB, SDHC, SDHD, SMAD4, SMARCA4, SMARCB1, SMARCE1, STK11, SUFU, TMEM127, TP53*, TSC1, TSC2, VHL and XRCC2 (sequencing and deletion/duplication); EGFR, EGLN1, HOXB13, KIT, MITF, PDGFRA, POLD1, and POLE (sequencing only); EPCAM and GREM1 (deletion/duplication only). DNA and RNA analyses performed for * genes.       CHIEF COMPLIANT:   INTERVAL HISTORY: Michael Stuart is a   ALLERGIES:  is allergic to bactrim [sulfamethoxazole-trimethoprim].  MEDICATIONS:  Current Outpatient Medications  Medication Sig Dispense Refill   acetaminophen (TYLENOL) 500 MG tablet Take 1,500 mg by mouth daily as needed for moderate pain or headache.     atorvastatin (LIPITOR) 20 MG tablet TAKE 1 TABLET BY MOUTH ONCE DAILY 30 tablet 3   dapagliflozin propanediol (FARXIGA) 10 MG TABS tablet Take 1 tablet by mouth daily. 90 tablet 2   eplerenone (INSPRA) 50 MG tablet Take 1 tablet by mouth daily. 90 tablet 3   metoprolol succinate (TOPROL-XL) 50 MG 24 hr tablet Take 1 tablet (50 mg total) by mouth daily. 90 tablet 3   sacubitril-valsartan (ENTRESTO) 97-103 MG Take 1 tablet by mouth 2 (two) times daily. 60 tablet 11   tamoxifen (NOLVADEX) 20 MG tablet Take 1 tablet (20 mg total) by mouth daily. 90 tablet 3   No current facility-administered medications for this visit.    PHYSICAL EXAMINATION: ECOG PERFORMANCE STATUS: {CHL ONC ECOG PS:[phone]}  There were no vitals filed for this visit. There were no vitals filed for this visit.  BREAST:*** No palpable masses or nodules in either right or left breasts. No palpable axillary supraclavicular or infraclavicular adenopathy no breast tenderness or nipple discharge. (exam performed in the presence of a chaperone)  LABORATORY DATA:  I have reviewed the data as listed    Latest Ref Rng & Units 06/11/2022    9:52 AM 02/22/2022    1:03 AM 02/13/2022    9:31 AM  CMP  Glucose 70 - 99 mg/dL 105  129  97  BUN 6 - 20 mg/dL 14  14  12   $ Creatinine 0.61 - 1.24 mg/dL 1.03  0.82  0.82   Sodium 135 - 145 mmol/L 136  135  138   Potassium 3.5 - 5.1 mmol/L 4.7  4.2  4.3   Chloride 98 - 111 mmol/L 106  103  105   CO2 22 - 32 mmol/L 25  22  23   $ Calcium 8.9 - 10.3 mg/dL 8.8  8.7  9.3     Lab Results  Component Value Date   WBC 15.3 (H) 02/22/2022   HGB 13.9 02/22/2022   HCT 41.6  02/22/2022   MCV 99.0 02/22/2022   PLT 196 02/22/2022   NEUTROABS 5.0 01/16/2022    ASSESSMENT & PLAN:  No problem-specific Assessment & Plan notes found for this encounter.    No orders of the defined types were placed in this encounter.  The patient has a good understanding of the overall plan. he agrees with it. he will call with any problems that may develop before the next visit here. Total time spent: 30 mins including face to face time and time spent for planning, charting and co-ordination of care   Suzzette Righter, [place] 08/28/22    I Gardiner Coins am acting as a Education administrator for [employer]  ***

## 2022-08-30 ENCOUNTER — Inpatient Hospital Stay: Payer: BC Managed Care – PPO | Attending: Hematology and Oncology | Admitting: Hematology and Oncology

## 2022-08-30 ENCOUNTER — Encounter: Payer: Self-pay | Admitting: Rehabilitation

## 2022-08-30 ENCOUNTER — Other Ambulatory Visit: Payer: Self-pay

## 2022-08-30 ENCOUNTER — Ambulatory Visit: Payer: BC Managed Care – PPO | Attending: Hematology and Oncology | Admitting: Rehabilitation

## 2022-08-30 ENCOUNTER — Encounter: Payer: Self-pay | Admitting: Hematology and Oncology

## 2022-08-30 VITALS — BP 146/96 | HR 92 | Temp 97.7°F | Resp 18 | Ht 75.0 in | Wt 292.3 lb

## 2022-08-30 DIAGNOSIS — R293 Abnormal posture: Secondary | ICD-10-CM | POA: Diagnosis not present

## 2022-08-30 DIAGNOSIS — I890 Lymphedema, not elsewhere classified: Secondary | ICD-10-CM | POA: Diagnosis not present

## 2022-08-30 DIAGNOSIS — M7989 Other specified soft tissue disorders: Secondary | ICD-10-CM | POA: Insufficient documentation

## 2022-08-30 DIAGNOSIS — Z79899 Other long term (current) drug therapy: Secondary | ICD-10-CM | POA: Diagnosis not present

## 2022-08-30 DIAGNOSIS — Z923 Personal history of irradiation: Secondary | ICD-10-CM | POA: Insufficient documentation

## 2022-08-30 DIAGNOSIS — C50422 Malignant neoplasm of upper-outer quadrant of left male breast: Secondary | ICD-10-CM | POA: Diagnosis not present

## 2022-08-30 DIAGNOSIS — Z9221 Personal history of antineoplastic chemotherapy: Secondary | ICD-10-CM | POA: Diagnosis not present

## 2022-08-30 DIAGNOSIS — Z483 Aftercare following surgery for neoplasm: Secondary | ICD-10-CM

## 2022-08-30 DIAGNOSIS — L599 Disorder of the skin and subcutaneous tissue related to radiation, unspecified: Secondary | ICD-10-CM

## 2022-08-30 DIAGNOSIS — Z79810 Long term (current) use of selective estrogen receptor modulators (SERMs): Secondary | ICD-10-CM | POA: Diagnosis not present

## 2022-08-30 DIAGNOSIS — Z7984 Long term (current) use of oral hypoglycemic drugs: Secondary | ICD-10-CM | POA: Diagnosis not present

## 2022-08-30 DIAGNOSIS — Z170 Estrogen receptor positive status [ER+]: Secondary | ICD-10-CM | POA: Insufficient documentation

## 2022-08-30 DIAGNOSIS — C50412 Malignant neoplasm of upper-outer quadrant of left female breast: Secondary | ICD-10-CM

## 2022-08-30 NOTE — Assessment & Plan Note (Addendum)
09/15/2021:Palpable left breast mass x8 months. CT 08/27/2021 in ED showed 3.8 cm left breast mass with left axillary lymph nodes.Mamm and [place]: 4 cm mass with 5 axillary lymph nodes: Biopsy: Grade 2 IDC, lymph node: Positive, ER 100%, PR 50%, Ki-67 25%, HER2 negative ratio 1.25, copy #3.45 (Recent history of coronary artery disease EF 22% and stroke)   Treatment plan: 1. Neoadjuvant chemotherapy with Taxotere and Cytoxan every 3 weeks x6 cycles completed 01/16/2022 2. left mastectomy: Residual grade 2 IDC 3.5 cm, margins negative, 2/2 lymph nodes positive, angiolymphatic invasion present, ER 100%, PR 50%, HER2 negative, Ki-67 25% 3. Followed by adjuvant radiation therapy 04/03/22-05/16/22 4.  Followed by adjuvant antiestrogen therapy +/- CDK 4 and 6 inhibitor CT CAP and bone scan: No distant metastatic disease ---------------------------------------------------------------------------------------------------------------------------------- Tamoxifen toxicities: Hot Flashes  We discussed the pros and cons of Verzinio. He will inform us if he wants to take Verzinio so that we can make additional appointments for toxicity and monitoring and follow-ups.  Since Signatera is negative we do not have to do routine scans.  Return to clinic in 1 year if he does not start to Verzinio.

## 2022-08-30 NOTE — Therapy (Signed)
OUTPATIENT PHYSICAL THERAPY BREAST CANCER EVALUATION   Patient Name: Michael Stuart MRN: [id number] DOB:August 29, 1980, 42 y.o., male Today's Date: 08/30/2022  END OF SESSION:  PT End of Session - 08/30/22 1508     Visit Number 1    Number of Visits 11    Date for PT Re-Evaluation 10/04/22    Authorization Type No Auth    PT Start Time 1505    PT Stop Time 1558    PT Time Calculation (min) 53 min    Activity Tolerance Patient tolerated treatment well    Behavior During Therapy WFL for tasks assessed/performed              Past Medical History:  Diagnosis Date   CHF (congestive heart failure) ([place])    Myocardial infarction ([place])    Pneumonia    Past Surgical History:  Procedure Laterality Date   IR IMAGING GUIDED PORT INSERTION  09/28/2021   MASTECTOMY W/ SENTINEL NODE BIOPSY Left 02/21/2022   Procedure: LEFT SIMPLE MASTECTOMY WITH LEFT TARGETED LYMPH NODE BIOPSY;  Surgeon: Erroll Luna, MD;  Location: [facility];  Service: General;  Laterality: Left;  GEN w/ PEC BLOCK   PORT-A-CATH REMOVAL Right 02/21/2022   Procedure: PORT REMOVAL;  Surgeon: Erroll Luna, MD;  Location: [facility];  Service: General;  Laterality: Right;   RIGHT/LEFT HEART CATH AND CORONARY ANGIOGRAPHY N/A 08/28/2021   Procedure: RIGHT/LEFT HEART CATH AND CORONARY ANGIOGRAPHY;  Surgeon: Nigel Mormon, MD;  Location: [facility];  Service: Cardiovascular;  Laterality: N/A;   Patient Active Problem List   Diagnosis Date Noted   Breast cancer, stage 3, left ([place]) 02/21/2022   Genetic testing 01/22/2022   LV (left ventricular) mural thrombus 12/13/2021   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, estrogen receptor positive ([place]) 12/13/2021   Port-A-Cath in place 10/03/2021   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, estrogen receptor positive ([place]) 09/25/2021   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 Lindi Adie   REFERRING DIAG: C50.412,Z17.0 (ICD-10-CM) - 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, estrogen receptor positive ([place])   THERAPY DIAG:  Abnormal posture  Aftercare following surgery for neoplasm  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, estrogen receptor positive ([place])  Disorder of the skin and subcutaneous tissue related to radiation, unspecified  Lymphedema, not elsewhere classified  Rationale for Evaluation and Treatment: Rehabilitation  ONSET DATE: 09/15/21  SUBJECTIVE:  SUBJECTIVE STATEMENT:   I was in [place] 1/20-2/15 and my arm started to swell.  It came back down but it still swollen.  I have been wearing a compression sleeve at night.    PERTINENT HISTORY:  08/27/2021 revealed 3.8 cm left breast mass with enlarged left axillary lymph nodes.  Mammogram confirmed that the mass to be 4 cm with 5 enlarged lymph nodes.  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 Completed chemotherapy. Left mastectomy on 02/21/22 with 4/5 positive lymph nodes. Completed radiation 05/16/22. No metastatic disease. Will start tamoxifen and Verzinio. Most recent EF 50-55%  PATIENT GOALS:  Reassess how my recovery is going related to arm function,  pain, and swelling.  PAIN:  Are you having pain?   PRECAUTIONS: Recent Surgery, left UE Lymphedema risk  ACTIVITY LEVEL / LEISURE: back to work and to walking    OBJECTIVE:  PATIENT SURVEYS:  QUICK DASH: not tested   OBSERVATIONS: Left chest previous mastectomy and radiation, Left arm larger.   POSTURE:  WNL  LYMPHEDEMA ASSESSMENT:   UPPER EXTREMITY AROM/PROM:  WNL    LYMPHEDEMA ASSESSMENTS:    LANDMARK RIGHT  10/02/2021 08/30/22  15cm proximal   38  10 cm proximal to olecranon process 34.6 36.5  Olecranon process 29.6 31.5  15cm proximal  32.2  10 cm proximal to ulnar styloid process 26.5 27.2  Just proximal to ulnar styloid process 18.5 19  Across hand at thumb web space 24.7 24  At base of 2nd digit 7 7.6  (Blank rows = not tested)    [facility] LEFT  10/02/2021 08/30/22  Axilla   46.2  15cm proximal   44.8  10 cm proximal to olecranon process 35.7 43.5  Olecranon process 30.9 36  15cm proximal  34.8  10 cm proximal to ulnar styloid process 25.5 30.5  Just proximal to ulnar styloid process 17.9 20.4  Across hand at thumb web space 24 25.9  At base of 2nd digit 7 7.8  (Blank rows = not tested)  Left:  3954m Rt:  21540m  LDEX: 48.2 on 08/30/22  TODAY"S TREATMENT: 08/30/22: Eval performed.  Due to pt flying next week we applied a compression bandage as follows: juzo liner, artiflex hand to axilla, 1 6cm hand bandage, 1 8cm herringbone forearm, 1 8cm from distal forearm to axilla and, 1 10cm from wrist to axilla with education on when to remove and to bring all items back on Monday.   Pt reports he feels like he can do this himself and has experience from wrapping for boxing so he was given a link to [facility] but did not get to practice in clinic.    PATIENT EDUCATION:  Education details: POC, bandaging principles, bandaging briefly and link to MD anOuida Sillself bandaging video Person educated: Patient Education method: Demo, vc Education  comprehension: needs more education  HOME EXERCISE PROGRAM:   ASSESSMENT:  CLINICAL IMPRESSION: Pt returns with new onset of lymphedema in the Lt UE after flying overseas.  He has been self treating with a circular knit stocking at night so he was educated on not doing this.  His volume difference between arms is [id number] almost 4-5 cm globally.  He flies again next Tuesday so we decided to apply the bandage today with brief education and then he will remove on Saturday and bring things back for a visit  on Monday to learn self bandaging / be bandaged for his Tuesday morning trip.  We will then begin CDT after he returns in a few days.    Pt will benefit from skilled therapeutic intervention to improve on the following deficits: Decreased knowledge of precautions, impaired UE functional use, pain, decreased ROM, postural dysfunction.   PT treatment/interventions: ADL/Self care home management, Patient/Family education, Manual therapy, re-evaluation, therapeutic exercise.    GOALS: Goals reviewed with patient? Yes  LONG TERM GOALS:  (STG=LTG)  GOALS Name Target Date  Goal status  1 Pt be ind with self bandaging for the UE 10/04/22 INITIAL  2 Pt will be ind with MLD or compression pump for edema management 10/04/22 INITIAL  3 Pt will obtain maximum reduction to obtain day and night compression 10/04/22 INITIAL          PLAN:  PT FREQUENCY/DURATION: 2xper week x 5 weeks (pt feels like he can manage bandaging with some instruction)  PLAN FOR NEXT SESSION: focus on bandaging education and performance for flight the next day. (Has MD [facility]erson self bandaging link) He reported upon leaving that he didn't like the cotton so he may do better with rosidal/foam.  CDT Lt UE: Consider pump.   Sending demographics to [facility] 08/30/22    Stark Bray, PT 08/30/2022, 5:16 PM

## 2022-08-31 ENCOUNTER — Other Ambulatory Visit: Payer: Self-pay | Admitting: Cardiology

## 2022-09-03 ENCOUNTER — Ambulatory Visit: Payer: BC Managed Care – PPO

## 2022-09-06 ENCOUNTER — Encounter: Payer: Self-pay | Admitting: Hematology and Oncology

## 2022-09-07 ENCOUNTER — Ambulatory Visit: Payer: BC Managed Care – PPO | Attending: Hematology and Oncology

## 2022-09-07 DIAGNOSIS — R293 Abnormal posture: Secondary | ICD-10-CM | POA: Diagnosis not present

## 2022-09-07 DIAGNOSIS — Z483 Aftercare following surgery for neoplasm: Secondary | ICD-10-CM

## 2022-09-07 DIAGNOSIS — I890 Lymphedema, not elsewhere classified: Secondary | ICD-10-CM | POA: Diagnosis not present

## 2022-09-07 DIAGNOSIS — Z170 Estrogen receptor positive status [ER+]: Secondary | ICD-10-CM | POA: Diagnosis not present

## 2022-09-07 DIAGNOSIS — L599 Disorder of the skin and subcutaneous tissue related to radiation, unspecified: Secondary | ICD-10-CM

## 2022-09-07 DIAGNOSIS — C50422 Malignant neoplasm of upper-outer quadrant of left male breast: Secondary | ICD-10-CM | POA: Insufficient documentation

## 2022-09-07 DIAGNOSIS — C50412 Malignant neoplasm of upper-outer quadrant of left female breast: Secondary | ICD-10-CM

## 2022-09-07 NOTE — Therapy (Signed)
OUTPATIENT PHYSICAL THERAPY BREAST CANCER TREATMENT   Patient Name: Michael Stuart MRN: [id number] DOB:[birth year], 42 y.o., male Today's Date: 09/07/2022  END OF SESSION:  PT End of Session - 09/07/22 1112     Visit Number 2    Number of Visits 11    PT Start Time 1109    PT Stop Time 1208    PT Time Calculation (min) 59 min    Activity Tolerance Patient tolerated treatment well    Behavior During Therapy WFL for tasks assessed/performed              Past Medical History:  Diagnosis Date   CHF (congestive heart failure) ([place])    Myocardial infarction ([place])    Pneumonia    Past Surgical History:  Procedure Laterality Date   IR IMAGING GUIDED PORT INSERTION  09/28/2021   MASTECTOMY W/ SENTINEL NODE BIOPSY Left 02/21/2022   Procedure: LEFT SIMPLE MASTECTOMY WITH LEFT TARGETED LYMPH NODE BIOPSY;  Surgeon: Erroll Luna, MD;  Location: [place];  Service: General;  Laterality: Left;  GEN w/ PEC BLOCK   PORT-A-CATH REMOVAL Right 02/21/2022   Procedure: PORT REMOVAL;  Surgeon: Erroll Luna, MD;  Location: [place];  Service: General;  Laterality: Right;   RIGHT/LEFT HEART CATH AND CORONARY ANGIOGRAPHY N/A 08/28/2021   Procedure: RIGHT/LEFT HEART CATH AND CORONARY ANGIOGRAPHY;  Surgeon: Nigel Mormon, MD;  Location: [place] [facility];  Service: Cardiovascular;  Laterality: N/A;   Patient Active Problem List   Diagnosis Date Noted   Breast cancer, stage 3, left ([place]) 02/21/2022   Genetic testing 01/22/2022   LV (left ventricular) mural thrombus 12/13/2021   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, estrogen receptor positive ([place]) 12/13/2021   Port-A-Cath in place 10/03/2021   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, estrogen receptor positive ([place]) 09/25/2021   Subacute viral myocarditis 09/04/2021   History of stroke 09/04/2021   CVA (cerebral vascular accident) ([place]) 09/01/2021   Cardiomyopathy ([place]) [id number]   Chronic systolic heart failure  ([place])    Pure hypercholesterolemia 08/27/2021   Tobacco user 08/27/2021   Mass of left breast 08/27/2021   Acute on chronic combined systolic and diastolic CHF (congestive heart failure) ([place]) 08/27/2021   Elevated troponin 08/27/2021   Elevated liver enzymes 08/27/2021   Leukocytosis 08/27/2021    PCP: None  REFERRING PROVIDER: Dr. 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 I also have a compression sock that I put on my arm at night. (Advised pt against continuing this)   PERTINENT HISTORY:  08/27/2021 revealed 3.8 cm left breast mass with enlarged left axillary lymph nodes.  Mammogram confirmed that the mass to be 4 cm with 5 enlarged lymph nodes.  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 Completed chemotherapy. Left mastectomy on 02/21/22 with 4/5 positive lymph nodes. Completed radiation 05/16/22. No metastatic disease. Will start tamoxifen and Verzinio. Most recent EF 50-55%  PATIENT GOALS:  Reassess how my recovery is going related to arm function, pain, and swelling.  PAIN:   Are you having pain?   PRECAUTIONS: Recent Surgery, left UE Lymphedema risk  ACTIVITY LEVEL / LEISURE: back to work and to walking    OBJECTIVE:  PATIENT SURVEYS:  QUICK DASH: not tested   OBSERVATIONS: Left chest previous mastectomy and radiation, Left arm larger.   POSTURE:  WNL  LYMPHEDEMA ASSESSMENT:   UPPER EXTREMITY AROM/PROM:  WNL    LYMPHEDEMA ASSESSMENTS:    LANDMARK RIGHT  10/02/2021 08/30/22  15cm proximal   38  10 cm proximal to olecranon process 34.6 36.5  Olecranon process 29.6 31.5  15cm proximal  32.2  10 cm proximal to ulnar styloid process 26.5 27.2  Just proximal to ulnar styloid process 18.5 19  Across hand at thumb web space 24.7 24  At base of 2nd digit 7 7.6  (Blank rows = not tested)    [facility] LEFT  10/02/2021 08/30/22 09/07/22  Axilla   46.2 44.3  15cm proximal   44.8 42.5  10 cm proximal to olecranon process 35.7 43.5 41.3  Olecranon process 30.9 36 35.8  15cm proximal  34.8 33.2  10 cm proximal to ulnar styloid process 25.5 30.5 29.2  Just proximal to ulnar styloid process 17.9 20.4 20.9  Across hand at thumb web space 24 25.9 24.7  At base of 2nd digit 7 7.8 7.2  (Blank rows = not tested)  Left:  3960m Rt:  21557m  LDEX: 48.2 on 08/30/22  TODAY"S TREATMENT: 09/07/22: Manual Therapy MLD: In Supine: Short neck, 5 diaphragmatic breaths, Lt inguinal and Rt axillary nodes, Lt axillo-inguinal and anterior inter-axillary anastomosis, then Lt UE working from proximal to distal (lateral upper, medial to lateral, and lateral again, then ant and post forearm, then dorsal hand and fingers) then retracing all steps.  Compression Bandaging: Cocoa butter, med TG soft, Molelast x2 to all 5 fingers, 1-6 cm to hand, 1-8 cm spiral and "X" at elbow, and 2-10 cm spirals from wrist to axilla.   08/30/22: Eval performed.  Due to pt flying next week we applied a compression bandage as follows: juzo liner, artiflex hand to axilla, 1 6cm hand bandage, 1 8cm  herringbone forearm, 1 8cm from distal forearm to axilla and, 1 10cm from wrist to axilla with education on when to remove and to bring all items back on Monday.   Pt reports he feels like he can do this himself and has experience from wrapping for boxing so he was given a link to [facility] but did not get to practice in clinic.    PATIENT EDUCATION:  Education details: POC, bandaging principles, bandaging briefly  and link to MD [facility] self bandaging video Person educated: Patient Education method: Demo, vc Education comprehension: needs more education  HOME EXERCISE PROGRAM:   ASSESSMENT:  CLINICAL IMPRESSION: Pt returns since returning from his recent trip for work. He bandaged from hand to forearm and reports wearing a compression sock (for legs) at night. He was again cautioned against wearing compressions stockings at night and educated him on how they have a low working/high resting pressure so it can apply a tourniquet feel to the arm when not moving. Pt verbalized good understanding. First session of MLD instructing pt in basics of anatomy of lymphatic system and principles of MLD. Then reapplied compression bandages. Pt wanted trial of no Artiflex reporting too hot so changed to TG soft stockinette. He was cautioned this could irritate skin however he will be doffing and donning bandages at home so risk of this should be low.    Pt will benefit from skilled therapeutic intervention to improve on the following deficits: Decreased knowledge of precautions, impaired UE functional use, pain, decreased ROM, postural dysfunction.   PT treatment/interventions: ADL/Self care home management, Patient/Family education, Manual therapy, re-evaluation, therapeutic exercise.    GOALS: Goals reviewed with patient? Yes  LONG TERM GOALS:  (STG=LTG)  GOALS Name Target Date  Goal status  1 Pt be ind with self bandaging for the UE 10/04/22 INITIAL  2 Pt will be ind with MLD or compression  pump for edema management 10/04/22 INITIAL  3 Pt will obtain maximum reduction to obtain day and night compression 10/04/22 INITIAL          PLAN:  PT FREQUENCY/DURATION: 2xper week x 5 weeks (pt feels like he can manage bandaging with some instruction)  PLAN FOR NEXT SESSION: how is self bandaging? (Has [facility] self bandaging link) Because he didn't like the cotton he may do better with rosidal/foam.  CDT Lt UE: Consider pump.   Sending demographics to [facility] 08/30/22    Otelia Limes, PTA 09/07/2022, 12:25 PM

## 2022-09-10 ENCOUNTER — Ambulatory Visit: Payer: BC Managed Care – PPO

## 2022-09-10 DIAGNOSIS — C50422 Malignant neoplasm of upper-outer quadrant of left male breast: Secondary | ICD-10-CM | POA: Diagnosis not present

## 2022-09-10 DIAGNOSIS — Z483 Aftercare following surgery for neoplasm: Secondary | ICD-10-CM | POA: Diagnosis not present

## 2022-09-10 DIAGNOSIS — L599 Disorder of the skin and subcutaneous tissue related to radiation, unspecified: Secondary | ICD-10-CM | POA: Diagnosis not present

## 2022-09-10 DIAGNOSIS — Z170 Estrogen receptor positive status [ER+]: Secondary | ICD-10-CM | POA: Diagnosis not present

## 2022-09-10 DIAGNOSIS — I890 Lymphedema, not elsewhere classified: Secondary | ICD-10-CM | POA: Diagnosis not present

## 2022-09-10 DIAGNOSIS — R293 Abnormal posture: Secondary | ICD-10-CM | POA: Diagnosis not present

## 2022-09-10 NOTE — Therapy (Signed)
OUTPATIENT PHYSICAL THERAPY BREAST CANCER TREATMENT   Patient Name: Michael Stuart MRN: [id number] DOB:05-12-1981, 42 y.o., male Today's Date: 09/10/2022  END OF SESSION:  PT End of Session - 09/10/22 1507     Visit Number 3    Number of Visits 11    Date for PT Re-Evaluation 10/04/22    PT Start Time 1505    PT Stop Time 1600    PT Time Calculation (min) 55 min    Activity Tolerance Patient tolerated treatment well    Behavior During Therapy WFL for tasks assessed/performed              Past Medical History:  Diagnosis Date   CHF (congestive heart failure) ([place])    Myocardial infarction ([place])    Pneumonia    Past Surgical History:  Procedure Laterality Date   IR IMAGING GUIDED PORT INSERTION  09/28/2021   MASTECTOMY W/ SENTINEL NODE BIOPSY Left 02/21/2022   Procedure: LEFT SIMPLE MASTECTOMY WITH LEFT TARGETED LYMPH NODE BIOPSY;  Surgeon: Erroll Luna, MD;  Location: [place];  Service: General;  Laterality: Left;  GEN w/ PEC BLOCK   PORT-A-CATH REMOVAL Right 02/21/2022   Procedure: PORT REMOVAL;  Surgeon: Erroll Luna, MD;  Location: [place];  Service: General;  Laterality: Right;   RIGHT/LEFT HEART CATH AND CORONARY ANGIOGRAPHY N/A 08/28/2021   Procedure: RIGHT/LEFT HEART CATH AND CORONARY ANGIOGRAPHY;  Surgeon: Nigel Mormon, MD;  Location: [place] [facility];  Service: Cardiovascular;  Laterality: N/A;   Patient Active Problem List   Diagnosis Date Noted   Breast cancer, stage 3, left ([place]) 02/21/2022   Genetic testing 01/22/2022   LV (left ventricular) mural thrombus 12/13/2021   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, estrogen receptor positive ([place]) 12/13/2021   Port-A-Cath in place 10/03/2021   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 It seems smaller.   PERTINENT HISTORY:  08/27/2021 revealed 3.8 cm left breast mass with enlarged left axillary lymph nodes.  Mammogram confirmed that the mass to be 4 cm with 5 enlarged lymph nodes.  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 Completed chemotherapy. Left mastectomy on 02/21/22 with 4/5 positive lymph nodes. Completed radiation 05/16/22. No metastatic disease. Will start tamoxifen and Verzinio. Most recent EF 50-55%  PATIENT GOALS:  Reassess how my recovery is going related to arm function, pain, and swelling.  PAIN:  Are you having pain? No  PRECAUTIONS: Recent Surgery, left UE Lymphedema risk  ACTIVITY LEVEL /  LEISURE: back to work and to walking    OBJECTIVE:  PATIENT SURVEYS:  QUICK DASH: not tested   OBSERVATIONS: Left chest previous mastectomy and radiation, Left arm larger.   POSTURE:  WNL  LYMPHEDEMA ASSESSMENT:   UPPER EXTREMITY AROM/PROM:  WNL    LYMPHEDEMA ASSESSMENTS:    LANDMARK RIGHT  10/02/2021 08/30/22  15cm proximal   38  10 cm proximal to olecranon process 34.6 36.5  Olecranon process 29.6 31.5  15cm proximal  32.2  10 cm proximal to ulnar styloid process 26.5 27.2  Just proximal to ulnar styloid process 18.5 19  Across hand at thumb web space 24.7 24  At base of 2nd digit 7 7.6  (Blank rows = not tested)    [facility] LEFT  10/02/2021 08/30/22 09/07/22  Axilla   46.2 44.3  15cm proximal   44.8 42.5  10 cm proximal to olecranon process 35.7 43.5 41.3  Olecranon process 30.9 36 35.8  15cm proximal  34.8 33.2  10 cm proximal to ulnar styloid process 25.5 30.5 29.2  Just proximal to ulnar styloid process 17.9 20.4 20.9  Across hand at thumb web space 24 25.9 24.7  At base of 2nd digit 7 7.8 7.2  (Blank rows = not tested)  Left:  3926m Rt:  21527m  LDEX: 48.2 on 08/30/22  TODAY"S TREATMENT: 09/10/22: Manual Therapy MLD: In Supine: Short neck, 5 diaphragmatic breaths, Lt inguinal and Rt axillary nodes, Lt axillo-inguinal and anterior inter-axillary anastomosis, then Lt UE working from proximal to distal (lateral upper, medial to lateral, and lateral again, then ant and post forearm, then dorsal hand and fingers) then retracing all steps.  Compression Bandaging: Cocoa butter, med TG soft, Molelast x2 to all 5 fingers, 1-6 cm to hand with 1/2" gray foam to dorsal hand and wrist, 1-8 cm spiral and "X" at elbow, and 2-10 cm spirals from wrist to axilla.  P/ROM to Lt shoulder into end flexion, abd and D2 MFR to Lt chest wall over mastectomy incision  09/07/22: Manual Therapy MLD: In Supine: Short neck, 5 diaphragmatic breaths, Lt inguinal and Rt axillary nodes, Lt  axillo-inguinal and anterior inter-axillary anastomosis, then Lt UE working from proximal to distal (lateral upper, medial to lateral, and lateral again, then ant and post forearm, then dorsal hand and fingers) then retracing all steps.  Compression Bandaging: Cocoa butter, med TG soft, Molelast x2 to all 5 fingers, 1-6 cm to hand, 1-8 cm spiral and "X" at elbow, and 2-10 cm spirals from wrist to axilla.   08/30/22: Eval performed.  Due to pt flying next week we applied a compression bandage as follows: juzo liner, artiflex hand to axilla, 1  6cm hand bandage, 1 8cm herringbone forearm, 1 8cm from distal forearm to axilla and, 1 10cm from wrist to axilla with education on when to remove and to bring all items back on Monday.   Pt reports he feels like he can do this himself and has experience from wrapping for boxing so he was given a link to MD [facility] video but did not get to practice in clinic.    PATIENT EDUCATION:  Education details: POC, bandaging principles, bandaging briefly and link to MD [facility] self bandaging video Person educated: Patient Education method: Demo, vc Education comprehension: needs more education  HOME EXERCISE PROGRAM:   ASSESSMENT:  CLINICAL IMPRESSION: Pt is doing well with self bandaging. Added 1/2" gray compression foam to dorsal hand for area of increased swelling. He is tolerating bandaging well.   Pt will benefit from skilled therapeutic intervention to improve on the following deficits: Decreased knowledge of precautions, impaired UE functional use, pain, decreased ROM, postural dysfunction.   PT treatment/interventions: ADL/Self care home management, Patient/Family education, Manual therapy, re-evaluation, therapeutic exercise.    GOALS: Goals reviewed with patient? Yes  LONG TERM GOALS:  (STG=LTG)  GOALS Name Target Date  Goal status  1 Pt be ind with self bandaging for the UE 10/04/22 INITIAL  2 Pt will be ind with MLD or compression pump for  edema management 10/04/22 INITIAL  3 Pt will obtain maximum reduction to obtain day and night compression 10/04/22 INITIAL          PLAN:  PT FREQUENCY/DURATION: 2xper week x 5 weeks (pt feels like he can manage bandaging with some instruction)  PLAN FOR NEXT SESSION: Because he didn't like the cotton he may do better with rosidal/foam.  CDT Lt UE: Consider pump.   Sending demographics to [employer] 08/30/22    Otelia Limes, PTA 09/10/2022, 4:01 PM

## 2022-09-13 ENCOUNTER — Ambulatory Visit: Payer: BC Managed Care – PPO | Admitting: Rehabilitation

## 2022-09-17 ENCOUNTER — Ambulatory Visit: Payer: BC Managed Care – PPO

## 2022-09-17 DIAGNOSIS — Z483 Aftercare following surgery for neoplasm: Secondary | ICD-10-CM | POA: Diagnosis not present

## 2022-09-17 DIAGNOSIS — L599 Disorder of the skin and subcutaneous tissue related to radiation, unspecified: Secondary | ICD-10-CM

## 2022-09-17 DIAGNOSIS — I890 Lymphedema, not elsewhere classified: Secondary | ICD-10-CM

## 2022-09-17 DIAGNOSIS — R293 Abnormal posture: Secondary | ICD-10-CM

## 2022-09-17 DIAGNOSIS — Z170 Estrogen receptor positive status [ER+]: Secondary | ICD-10-CM

## 2022-09-17 DIAGNOSIS — C50422 Malignant neoplasm of upper-outer quadrant of left male breast: Secondary | ICD-10-CM | POA: Diagnosis not present

## 2022-09-17 DIAGNOSIS — C50412 Malignant neoplasm of upper-outer quadrant of left female breast: Secondary | ICD-10-CM

## 2022-09-17 NOTE — Patient Instructions (Signed)

## 2022-09-17 NOTE — Therapy (Signed)
OUTPATIENT PHYSICAL THERAPY BREAST CANCER TREATMENT   Patient Name: Michael Stuart MRN: [id number] DOB:December 19, 1980, 42 y.o., male Today's Date: 09/17/2022  END OF SESSION:  PT End of Session - 09/17/22 1701     Visit Number 4    Number of Visits 11    Date for PT Re-Evaluation 10/04/22    Authorization Type No Auth    PT Start Time 1605    PT Stop Time 1700    PT Time Calculation (min) 55 min    Activity Tolerance Patient tolerated treatment well    Behavior During Therapy WFL for tasks assessed/performed               Past Medical History:  Diagnosis Date   CHF (congestive heart failure) ([place])    Myocardial infarction ([place])    Pneumonia    Past Surgical History:  Procedure Laterality Date   IR IMAGING GUIDED PORT INSERTION  09/28/2021   MASTECTOMY W/ SENTINEL NODE BIOPSY Left 02/21/2022   Procedure: LEFT SIMPLE MASTECTOMY WITH LEFT TARGETED LYMPH NODE BIOPSY;  Surgeon: Erroll Luna, MD;  Location: [facility];  Service: General;  Laterality: Left;  GEN w/ PEC BLOCK   PORT-A-CATH REMOVAL Right 02/21/2022   Procedure: PORT REMOVAL;  Surgeon: Erroll Luna, MD;  Location: [facility];  Service: General;  Laterality: Right;   RIGHT/LEFT HEART CATH AND CORONARY ANGIOGRAPHY N/A 08/28/2021   Procedure: RIGHT/LEFT HEART CATH AND CORONARY ANGIOGRAPHY;  Surgeon: Nigel Mormon, MD;  Location: [facility];  Service: Cardiovascular;  Laterality: N/A;   Patient Active Problem List   Diagnosis Date Noted   Breast cancer, stage 3, left ([place]) 02/21/2022   Genetic testing 01/22/2022   LV (left ventricular) mural thrombus 12/13/2021   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, estrogen receptor positive ([place]) 12/13/2021   Port-A-Cath in place 10/03/2021   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, estrogen receptor positive ([place]) 09/25/2021   Subacute viral myocarditis 09/04/2021   History of stroke 09/04/2021   CVA (cerebral vascular accident) ([place])  09/01/2021   Cardiomyopathy ([place]) [id number]   Chronic systolic heart failure ([place])    Pure hypercholesterolemia 08/27/2021   Tobacco user 08/27/2021   Mass of left breast 08/27/2021   Acute on chronic combined systolic and diastolic CHF (congestive heart failure) ([place]) 08/27/2021   Elevated troponin 08/27/2021   Elevated liver enzymes 08/27/2021   Leukocytosis 08/27/2021    PCP: None  REFERRING PROVIDER: Dr. 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 I have been wearing the copper fit sleeve because I am not good at bandaging.  My arm has been softer in the forearm. Offered to have pt practice bandaging but wanted to wait until Thursday.  PERTINENT HISTORY:  08/27/2021 revealed 3.8 cm left breast mass with enlarged left axillary lymph nodes.  Mammogram confirmed that the mass to be 4 cm with 5 enlarged lymph nodes.  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 Completed chemotherapy. Left mastectomy on 02/21/22 with 4/5 positive lymph nodes. Completed radiation 05/16/22. No metastatic disease. Will start tamoxifen and Verzinio. Most recent EF  50-55%  PATIENT GOALS:  Reassess how my recovery is going related to arm function, pain, and swelling.  PAIN:  Are you having pain? No  PRECAUTIONS: Recent Surgery, left UE Lymphedema risk  ACTIVITY LEVEL / LEISURE: back to work and to walking    OBJECTIVE:  PATIENT SURVEYS:  QUICK DASH: not tested   OBSERVATIONS: Left chest previous mastectomy and radiation, Left arm larger.   POSTURE:  WNL  LYMPHEDEMA ASSESSMENT:   UPPER EXTREMITY AROM/PROM:  WNL    LYMPHEDEMA ASSESSMENTS:    [facility] RIGHT  10/02/2021 08/30/22  15cm proximal   38  10 cm proximal to olecranon process 34.6 36.5  Olecranon process 29.6 31.5  15cm proximal  32.2  10 cm proximal to ulnar styloid process 26.5 27.2  Just proximal to ulnar styloid process 18.5 19  Across hand at thumb web space 24.7 24  At base of 2nd digit 7 7.6  (Blank rows = not tested)    [facility] LEFT  10/02/2021 08/30/22 09/07/22  Axilla   46.2 44.3  15cm proximal   44.8 42.5  10 cm proximal to olecranon process 35.7 43.5 41.3  Olecranon process 30.9 36 35.8  15cm proximal  34.8 33.2  10 cm proximal to ulnar styloid process 25.5 30.5 29.2  Just proximal to ulnar styloid process 17.9 20.4 20.9  Across hand at thumb web space 24 25.9 24.7  At base of 2nd digit 7 7.8 7.2  (Blank rows = not tested)  Left:  3929m Rt:  21557m  LDEX: 48.2 on 08/30/22  TODAY"S TREATMENT:  09/17/2022  MLD: In Supine: Short neck, 5 diaphragmatic breaths, Lt inguinal and Rt axillary nodes, Lt axillo-inguinal and anterior inter-axillary anastomosis, then Lt UE working from proximal to distal (lateral upper, medial to lateral, and lateral again, then ant and post forearm, then dorsal hand and fingers) then retracing all steps.  Compression Bandaging: Cocoa butter, New  med TG soft, no finger wrap at pt request;he said not done last time and no finger swelling, 1-6 cm to hand with 1/2" gray foam to dorsal hand and wrist, 1-8 cm spiral and "X" at elbow, and  110 cm spirals from prox forearm to axilla and 1 10 cm from wrist to axilla. Pt given instructions for laundering bandages. Asked pt to try and stay in wraps or rewrap if possible before coming in next time so we can check his wrap.  09/10/22: Manual Therapy MLD: In Supine: Short neck, 5 diaphragmatic breaths, Lt inguinal and Rt axillary nodes, Lt axillo-inguinal and anterior inter-axillary anastomosis, then Lt UE working from proximal to distal (lateral upper, medial to lateral, and lateral again, then ant and post forearm, then dorsal hand and fingers) then retracing all steps.  Compression Bandaging: Cocoa butter, med TG soft, Molelast x2 to all 5 fingers, 1-6 cm to hand with 1/2" gray foam to dorsal hand and wrist, 1-8 cm spiral and "X" at elbow, and 2-10 cm spirals from wrist to axilla.  P/ROM to Lt shoulder into end flexion, abd and D2 MFR to Lt chest wall over mastectomy incision  09/07/22: Manual Therapy MLD: In Supine: Short neck, 5 diaphragmatic breaths, Lt inguinal and Rt axillary nodes, Lt axillo-inguinal and anterior inter-axillary anastomosis, then Lt UE working from proximal to distal (lateral upper, medial to lateral, and lateral again, then ant and post forearm, then dorsal hand and fingers) then retracing all steps.  Compression Bandaging: Cocoa butter, med TG soft, Molelast x2 to all 5 fingers, 1-6 cm to hand, 1-8 cm spiral and "X" at elbow, and 2-10 cm spirals from wrist to axilla.   08/30/22: Eval performed.  Due to pt flying next week we applied a compression bandage as follows: juzo liner, artiflex hand to axilla, 1 6cm hand bandage, 1 8cm herringbone forearm, 1 8cm from distal forearm to axilla and, 1 10cm from wrist to axilla with education on when to remove and to bring all items back on Monday.   Pt reports he feels like he can do this himself and has experience from wrapping for boxing so he was given a link to MD [facility] video but did not get to practice in clinic.     PATIENT EDUCATION:  Education details: POC, bandaging principles, bandaging briefly and link to MD [facility] self bandaging video Person educated: Patient Education method: Demo, vc Education comprehension: needs more education  HOME EXERCISE PROGRAM:   ASSESSMENT:  CLINICAL IMPRESSION: Pt felt his left arm has been softer, and greater softening was noted after MLD today. Therapist wrappted pt over TG soft at his request, but also showed him comprilan foam that can be used next time prn. Pt has a ridge on his upper arm from the copper fit sleeve and increase in swelling in upper arm just below ridge.  Pt will benefit from skilled therapeutic intervention to improve on the following deficits: Decreased knowledge of precautions, impaired UE functional use, pain, decreased ROM, postural dysfunction.   PT treatment/interventions: ADL/Self care home management, Patient/Family education, Manual therapy, re-evaluation, therapeutic exercise.    GOALS: Goals reviewed with patient? Yes  LONG TERM GOALS:  (STG=LTG)  GOALS Name Target Date  Goal status  1 Pt be ind with self bandaging for the UE 10/04/22 INITIAL  2 Pt will be ind with MLD or compression pump for edema management 10/04/22 INITIAL  3 Pt will obtain maximum reduction to obtain day and night compression 10/04/22 INITIAL          PLAN:  PT FREQUENCY/DURATION: 2xper week x 5 weeks (pt feels like he can manage bandaging with some instruction)  PLAN FOR NEXT SESSION:  measure, practice bandaging,Because he didn't like the cotton he may do better with rosidal/foam.  CDT Lt UE: Consider pump.   Sending demographics to [employer] 08/30/22    Claris Pong, PT 09/17/2022, 5:02 PM

## 2022-09-20 ENCOUNTER — Ambulatory Visit: Payer: BC Managed Care – PPO

## 2022-09-20 DIAGNOSIS — Z483 Aftercare following surgery for neoplasm: Secondary | ICD-10-CM | POA: Diagnosis not present

## 2022-09-20 DIAGNOSIS — I890 Lymphedema, not elsewhere classified: Secondary | ICD-10-CM

## 2022-09-20 DIAGNOSIS — R293 Abnormal posture: Secondary | ICD-10-CM

## 2022-09-20 DIAGNOSIS — Z170 Estrogen receptor positive status [ER+]: Secondary | ICD-10-CM | POA: Diagnosis not present

## 2022-09-20 DIAGNOSIS — C50412 Malignant neoplasm of upper-outer quadrant of left female breast: Secondary | ICD-10-CM

## 2022-09-20 DIAGNOSIS — C50422 Malignant neoplasm of upper-outer quadrant of left male breast: Secondary | ICD-10-CM | POA: Diagnosis not present

## 2022-09-20 DIAGNOSIS — L599 Disorder of the skin and subcutaneous tissue related to radiation, unspecified: Secondary | ICD-10-CM

## 2022-09-20 NOTE — Therapy (Signed)
OUTPATIENT PHYSICAL THERAPY BREAST CANCER TREATMENT   Patient Name: Michael Stuart MRN: [id number] DOB:12-Jun-1981, 42 y.o., male Today's Date: 09/20/2022  END OF SESSION:  PT End of Session - 09/20/22 1604     Visit Number 5    Number of Visits 11    Date for PT Re-Evaluation 10/04/22    Authorization Type No Auth    PT Start Time 1605    PT Stop Time 1705    PT Time Calculation (min) 60 min    Activity Tolerance Patient tolerated treatment well    Behavior During Therapy WFL for tasks assessed/performed               Past Medical History:  Diagnosis Date   CHF (congestive heart failure) ([place])    Myocardial infarction ([place])    Pneumonia    Past Surgical History:  Procedure Laterality Date   IR IMAGING GUIDED PORT INSERTION  09/28/2021   MASTECTOMY W/ SENTINEL NODE BIOPSY Left 02/21/2022   Procedure: LEFT SIMPLE MASTECTOMY WITH LEFT TARGETED LYMPH NODE BIOPSY;  Surgeon: Erroll Luna, MD;  Location: [facility];  Service: General;  Laterality: Left;  GEN w/ PEC BLOCK   PORT-A-CATH REMOVAL Right 02/21/2022   Procedure: PORT REMOVAL;  Surgeon: Erroll Luna, MD;  Location: [facility];  Service: General;  Laterality: Right;   RIGHT/LEFT HEART CATH AND CORONARY ANGIOGRAPHY N/A 08/28/2021   Procedure: RIGHT/LEFT HEART CATH AND CORONARY ANGIOGRAPHY;  Surgeon: Nigel Mormon, MD;  Location: [facility];  Service: Cardiovascular;  Laterality: N/A;   Patient Active Problem List   Diagnosis Date Noted   Breast cancer, stage 3, left ([place]) 02/21/2022   Genetic testing 01/22/2022   LV (left ventricular) mural thrombus 12/13/2021   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, estrogen receptor positive ([place]) 12/13/2021   Port-A-Cath in place 10/03/2021   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 Can we wait until Monday to practice bandaging? I'm really tired.   PERTINENT HISTORY:  08/27/2021 revealed 3.8 cm left breast mass with enlarged left axillary lymph nodes.  Mammogram confirmed that the mass to be 4 cm with 5 enlarged lymph nodes.  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 Completed chemotherapy. Left mastectomy on 02/21/22 with 4/5 positive lymph nodes. Completed radiation 05/16/22. No metastatic disease. Will start tamoxifen and Verzinio. Most recent EF 50-55%  PATIENT GOALS:  Reassess how my recovery is going related to arm function, pain, and  swelling.  PAIN:  Are you having pain? No  PRECAUTIONS: Recent Surgery, left UE Lymphedema risk  ACTIVITY LEVEL / LEISURE: back to work and to walking    OBJECTIVE:  PATIENT SURVEYS:  QUICK DASH: not tested   OBSERVATIONS: Left chest previous mastectomy and radiation, Left arm larger.   POSTURE:  WNL  LYMPHEDEMA ASSESSMENT:   UPPER EXTREMITY AROM/PROM:  WNL    LYMPHEDEMA ASSESSMENTS:    [facility] RIGHT  10/02/2021 08/30/22 09/20/2022  axilla   43.5  15cm proximal   38   10 cm proximal to olecranon process 34.6 36.5   Olecranon process 29.6 31.5   15cm proximal  32.2   10 cm proximal to ulnar styloid process 26.5 27.2   Just proximal to ulnar styloid process 18.5 19   Across hand at thumb web space 24.7 24   At base of 2nd digit 7 7.6   (Blank rows = not tested)    [facility] LEFT  10/02/2021 08/30/22 09/07/22 09/20/2022  Axilla   46.2 44.3 44.0  15cm proximal   44.8 42.5 43.3  10 cm proximal to olecranon process 35.7 43.5 41.3 41.2  Olecranon process 30.9 36 35.8 35.0  15cm proximal  34.8 33.2 33.2  10 cm proximal to ulnar styloid process 25.5 30.5 29.2 28.5  Just proximal to ulnar styloid process 17.9 20.4 20.9 21.3  Across hand at thumb web space 24 25.9 24.7 24.4  At base of 2nd digit 7 7.8 7.2 7.1  (Blank rows = not tested)  Left:  3963m Rt:  21546m  LDEX: 48.2 on 08/30/22  TODAY"S TREATMENT: 09/20/2022  Measured pts arm however pt has not been consistent with wearing bandages. Encouraged compliance over the weekend so we can measure Monday and see if he is still reducing. MLD: In Supine: Short neck, 5 diaphragmatic breaths, Lt inguinal and Rt axillary nodes, Lt axillo-inguinal and anterior inter-axillary anastomosis, then Lt UE working from proximal to distal (lateral upper, medial to lateral, and lateral again, then ant and post forearm, then dorsal hand and fingers) then retracing all steps and ending with LN's Compression Bandaging: Cocoa butter,   med TG  soft, no finger wrap at pt request;he said not done last time and no finger swelling, . Therapist demonstrated 6 cm wrap and had pt practice wrapping the hand himself. He did very well but needed VC's not to put a long stretch on the bandage and to make sure and keep his fingers spread so he doesn't get it too tight. Therapist then re-did the hand and are as follows:1-6 cm to hand with 1/2" gray foam to dorsal hand and wrist, 1-8 cm spiral and "X" at elbow, and 1 10 cm spirals from prox forearm  to axilla and 1 10 cm from wrist to axilla.   09/17/2022  MLD: In Supine: Short neck, 5 diaphragmatic breaths, Lt inguinal and Rt axillary nodes, Lt axillo-inguinal and anterior inter-axillary anastomosis, then Lt UE working from proximal to distal (lateral upper, medial to lateral, and lateral again, then ant and post forearm, then dorsal hand and fingers) then retracing all steps.  Compression Bandaging: Cocoa butter, New  med TG soft, no finger wrap at pt request;he said not done last time and no finger swelling, 1-6 cm to hand with 1/2" gray foam to dorsal hand and wrist, 1-8 cm spiral and "X" at elbow, and 110 cm spirals from prox forearm to axilla and 1 10 cm from wrist to axilla. Pt given instructions for laundering bandages. Asked pt to try and stay in wraps or rewrap if possible before coming in next time so we can check his wrap.  09/10/22: Manual Therapy MLD: In Supine: Short neck, 5 diaphragmatic breaths, Lt inguinal and Rt axillary nodes, Lt axillo-inguinal and anterior inter-axillary anastomosis, then Lt UE working from proximal to distal (lateral upper, medial to lateral, and lateral again, then ant and post forearm, then dorsal hand and fingers) then retracing all steps.  Compression Bandaging: Cocoa butter, med TG soft, Molelast x2 to all 5 fingers, 1-6 cm to hand with 1/2" gray foam to dorsal hand and wrist, 1-8 cm spiral and "X" at elbow, and 2-10 cm spirals from wrist to axilla.  P/ROM to Lt  shoulder into end flexion, abd and D2 MFR to Lt chest wall over mastectomy incision  09/07/22: Manual Therapy MLD: In Supine: Short neck, 5 diaphragmatic breaths, Lt inguinal and Rt axillary nodes, Lt axillo-inguinal and anterior inter-axillary anastomosis, then Lt UE working from proximal to distal (lateral upper, medial to lateral, and lateral again, then ant and post forearm, then dorsal hand and fingers) then retracing all steps.  Compression Bandaging: Cocoa butter, med TG soft, Molelast x2 to all 5 fingers, 1-6 cm to hand, 1-8 cm spiral and "X" at elbow, and 2-10 cm spirals from wrist to axilla.   08/30/22: Eval performed.  Due to pt flying next week we applied a compression bandage as follows: juzo liner, artiflex hand to axilla, 1 6cm hand bandage, 1 8cm herringbone forearm, 1 8cm from distal forearm to axilla and, 1 10cm from wrist to axilla with education on when to remove and to bring all items back on Monday.   Pt reports he feels like he can do this himself and has experience from wrapping for boxing so he was given a link to MD [facility] video but did not get to practice in clinic.    PATIENT EDUCATION:  Education details: POC, bandaging principles, bandaging briefly and link to MD [facility] self bandaging video Person educated: Patient Education method: Demo, vc Education comprehension: needs more education  HOME EXERCISE PROGRAM:   ASSESSMENT:  CLINICAL IMPRESSION: Pt encouraged to be consistent with his bandaging so we know when he is ready for his sleeve. He verbalized understanding. He did very well wrapping his hand today and arm wrapping although not physically practiced was verbalized to pt. He continues with overall improvement, but not sure if he has plateau'd yet.  Pt will benefit from skilled therapeutic intervention to improve on the following deficits: Decreased knowledge of precautions, impaired UE functional use, pain, decreased ROM, postural dysfunction.   PT  treatment/interventions: ADL/Self care home management, Patient/Family education, Manual therapy, re-evaluation, therapeutic exercise.    GOALS: Goals  reviewed with patient? Yes  LONG TERM GOALS:  (STG=LTG)  GOALS Name Target Date  Goal status  1 Pt be ind with self bandaging for the UE 10/04/22 INITIAL  2 Pt will be ind with MLD or compression pump for edema management 10/04/22 INITIAL  3 Pt will obtain maximum reduction to obtain day and night compression 10/04/22 INITIAL          PLAN:  PT FREQUENCY/DURATION: 2xper week x 5 weeks (pt feels like he can manage bandaging with some instruction)  PLAN FOR NEXT SESSION:  measure, practice bandaging,Because he didn't like the cotton he may do better with rosidal/foam.  CDT Lt UE: Consider pump.   Sending demographics to [facility] 08/30/22    Claris Pong, PT 09/20/2022, 5:13 PM

## 2022-09-24 ENCOUNTER — Ambulatory Visit: Payer: BC Managed Care – PPO

## 2022-09-24 DIAGNOSIS — Z483 Aftercare following surgery for neoplasm: Secondary | ICD-10-CM | POA: Diagnosis not present

## 2022-09-24 DIAGNOSIS — I890 Lymphedema, not elsewhere classified: Secondary | ICD-10-CM

## 2022-09-24 DIAGNOSIS — L599 Disorder of the skin and subcutaneous tissue related to radiation, unspecified: Secondary | ICD-10-CM

## 2022-09-24 DIAGNOSIS — R293 Abnormal posture: Secondary | ICD-10-CM

## 2022-09-24 DIAGNOSIS — C50412 Malignant neoplasm of upper-outer quadrant of left female breast: Secondary | ICD-10-CM

## 2022-09-24 DIAGNOSIS — Z170 Estrogen receptor positive status [ER+]: Secondary | ICD-10-CM | POA: Diagnosis not present

## 2022-09-24 DIAGNOSIS — C50422 Malignant neoplasm of upper-outer quadrant of left male breast: Secondary | ICD-10-CM | POA: Diagnosis not present

## 2022-09-24 NOTE — Therapy (Addendum)
 OUTPATIENT PHYSICAL THERAPY BREAST CANCER TREATMENT   Patient Name: Michael Stuart MRN: [id number] DOB:1981-06-02, 42 y.o., male Today's Date: 09/24/2022  END OF SESSION:  PT End of Session - 09/24/22 1500     Visit Number 6    Number of Visits 11    Date for PT Re-Evaluation 10/04/22    PT Start Time 1501    PT Stop Time 1547    PT Time Calculation (min) 46 min    Activity Tolerance Patient tolerated treatment well    Behavior During Therapy WFL for tasks assessed/performed               Past Medical History:  Diagnosis Date   CHF (congestive heart failure) (HCC)    Myocardial infarction (HCC)    Pneumonia    Past Surgical History:  Procedure Laterality Date   IR IMAGING GUIDED PORT INSERTION  09/28/2021   MASTECTOMY W/ SENTINEL NODE BIOPSY Left 02/21/2022   Procedure: LEFT SIMPLE MASTECTOMY WITH LEFT TARGETED LYMPH NODE BIOPSY;  Surgeon: Harriette Bouillon, MD;  Location: [facility] OR;  Service: General;  Laterality: Left;  GEN w/ PEC BLOCK   PORT-A-CATH REMOVAL Right 02/21/2022   Procedure: PORT REMOVAL;  Surgeon: Harriette Bouillon, MD;  Location: [facility] OR;  Service: General;  Laterality: Right;   RIGHT/LEFT HEART CATH AND CORONARY ANGIOGRAPHY N/A 08/28/2021   Procedure: RIGHT/LEFT HEART CATH AND CORONARY ANGIOGRAPHY;  Surgeon: Elder Negus, MD;  Location: [facility] INVASIVE CV [facility];  Service: Cardiovascular;  Laterality: N/A;   Patient Active Problem List   Diagnosis Date Noted   Breast cancer, stage 3, left (HCC) 02/21/2022   Genetic testing 01/22/2022   LV (left ventricular) mural thrombus 12/13/2021   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, estrogen receptor positive (HCC) 12/13/2021   Port-A-Cath in place 10/03/2021   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 I took them off yesterday morning and washed them.  My arm was really itchy Friday and Saturday so I took it off . I haven't worn the bandages since Sunday am.  PERTINENT HISTORY:  08/27/2021 revealed 3.8 cm left breast mass with enlarged left axillary lymph nodes.  Mammogram confirmed that the mass to be 4 cm with 5 enlarged lymph nodes.  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 Completed chemotherapy. Left mastectomy on 02/21/22 with 4/5 positive lymph nodes. Completed radiation 05/16/22. No metastatic disease. Will start tamoxifen and Verzinio. Most recent EF 50-55%  PATIENT GOALS:  Reassess how my recovery is  going related to arm function, pain, and swelling.  PAIN:  Are you having pain? No  PRECAUTIONS: Recent Surgery, left UE Lymphedema risk  ACTIVITY LEVEL / LEISURE: back to work and to walking    OBJECTIVE:  PATIENT SURVEYS:  QUICK DASH: not tested   OBSERVATIONS: Left chest previous mastectomy and radiation, Left arm larger.   POSTURE:  WNL  LYMPHEDEMA ASSESSMENT:   UPPER EXTREMITY AROM/PROM:  WNL    LYMPHEDEMA ASSESSMENTS:    [facility] RIGHT  10/02/2021 08/30/22 09/20/2022  axilla   43.5  15cm proximal   38   10 cm proximal to olecranon process 34.6 36.5   Olecranon process 29.6 31.5   15cm proximal  32.2   10 cm proximal to ulnar styloid process 26.5 27.2   Just proximal to ulnar styloid process 18.5 19   Across hand at thumb web space 24.7 24   At base of 2nd digit 7 7.6   (Blank rows = not tested)    [facility] LEFT  10/02/2021 08/30/22 09/07/22 09/20/2022  Axilla   46.2 44.3 44.0  15cm proximal   44.8 42.5 43.3  10 cm proximal to olecranon process 35.7 43.5 41.3 41.2  Olecranon process 30.9 36 35.8 35.0  15cm proximal  34.8 33.2 33.2  10 cm proximal to ulnar styloid process 25.5 30.5 29.2 28.5  Just proximal to ulnar styloid process 17.9 20.4 20.9 21.3  Across hand at thumb web space 24 25.9 24.7 24.4  At base of 2nd digit 7 7.8 7.2 7.1  (Blank rows = not tested)  Left:  Rt:    LDEX: 48.2 on 08/30/22  TODAY"S TREATMENT:  09/24/2022 Pt came in unwrapped for 2 days so did not measured. Strongly encouraged to come in with wraps on so we can check his wrap and remeasure Pt instructed in self MLD to the left arm and practiced all as follows MLD: In Supine: Short neck, 5 diaphragmatic breaths, Lt inguinal and Rt axillary nodes, Lt axillo-inguinal and anterior inter-axillary anastomosis, then Lt UE working from proximal to distal (lateral upper, medial to lateral, and lateral again, then ant and post forearm, then dorsal hand and fingers) then retracing all  steps and ending with LN's. Pt required multiple VC's and TC's for proper technique and to use less pressure and slow down. Pt wrapped by therapist;Lotion applied and medicated powder for itch,TG soft inside out, no finger wrap at pt request;he said not done last time and no finger swelling, 1-6 cm to hand , 1-8 cm herringbone to elbow, and 1 10 cm spirals from prox forearm to axilla and 1 10 cm from wrist to axilla. 09/20/2022  Measured pts arm however pt  has not been consistent with wearing bandages. Encouraged compliance over the weekend so we can measure Monday and see if he is still reducing. MLD: In Supine: Short neck, 5 diaphragmatic breaths, Lt inguinal and Rt axillary nodes, Lt axillo-inguinal and anterior inter-axillary anastomosis, then Lt UE working from proximal to distal (lateral upper, medial to lateral, and lateral again, then ant and post forearm, then dorsal hand and fingers) then retracing all steps and ending with LN's Compression Bandaging: Cocoa butter,   med TG soft, no finger wrap at pt request;he said not done last time and no finger swelling, . Therapist demonstrated 6 cm wrap and had pt practice wrapping the hand himself. He did very well but needed VC's not to put a long stretch on the bandage and to make sure and keep his fingers spread so he doesn't get it too tight. Therapist then re-did the hand and are as follows:1-6 cm to hand with 1/2" gray foam to dorsal hand and wrist, 1-8 cm spiral and "X" at elbow, and 1 10 cm spirals from prox forearm to axilla and 1 10 cm from wrist to axilla.   09/17/2022  MLD: In Supine: Short neck, 5 diaphragmatic breaths, Lt inguinal and Rt axillary nodes, Lt axillo-inguinal and anterior inter-axillary anastomosis, then Lt UE working from proximal to distal (lateral upper, medial to lateral, and lateral again, then ant and post forearm, then dorsal hand and fingers) then retracing all steps.  Compression Bandaging: Cocoa butter, New  med TG  soft, no finger wrap at pt request;he said not done last time and no finger swelling, 1-6 cm to hand with 1/2" gray foam to dorsal hand and wrist, 1-8 cm spiral and "X" at elbow, and 110 cm spirals from prox forearm to axilla and 1 10 cm from wrist to axilla. Pt given instructions for laundering bandages. Asked pt to try and stay in wraps or rewrap if possible before coming in next time so we can check his wrap.  09/10/22: Manual Therapy MLD: In Supine: Short neck, 5 diaphragmatic breaths, Lt inguinal and Rt axillary nodes, Lt axillo-inguinal and anterior inter-axillary anastomosis, then Lt UE working from proximal to distal (lateral upper, medial to lateral, and lateral again, then ant and post forearm, then dorsal hand and fingers) then retracing all steps.  Compression Bandaging: Cocoa butter, med TG soft, Molelast x2 to all 5 fingers, 1-6 cm to hand with 1/2" gray foam to dorsal hand and wrist, 1-8 cm spiral and "X" at elbow, and 2-10 cm spirals from wrist to axilla.  P/ROM to Lt shoulder into end flexion, abd and D2 MFR to Lt chest wall over mastectomy incision  09/07/22: Manual Therapy MLD: In Supine: Short neck, 5 diaphragmatic breaths, Lt inguinal and Rt axillary nodes, Lt axillo-inguinal and anterior inter-axillary anastomosis, then Lt UE working from proximal to distal (lateral upper, medial to lateral, and lateral again, then ant and post forearm, then dorsal hand and fingers) then retracing all steps.  Compression Bandaging: Cocoa butter, med TG soft, Molelast x2 to all 5 fingers, 1-6 cm to hand, 1-8 cm spiral and "X" at elbow, and 2-10 cm spirals from wrist to axilla.   08/30/22: Eval performed.  Due to pt flying next week we applied a compression bandage as follows: juzo liner, artiflex hand to axilla, 1 6cm hand bandage, 1 8cm herringbone forearm, 1 8cm from distal forearm to axilla and, 1 10cm from wrist to axilla with education on when to remove and to bring all items  back on Monday.    Pt reports he feels like he can do this himself and has experience from wrapping for boxing so he was given a link to MD [facility] video but did not get to practice in clinic.    PATIENT EDUCATION:  Education details: POC, bandaging principles, bandaging briefly and link to MD Dareen Piano self bandaging video Person educated: Patient Education method: Demo, vc Education comprehension: needs more education  HOME EXERCISE PROGRAM:   ASSESSMENT:  CLINICAL IMPRESSION:  Pts arm visibly swollen today especially in forearm. Pt is not compliant with bandaging on a consistent basis despite encouragement to do so. Instructed pt in self MLD. He required multiple VC's and tactile cues for stretch and pressure, but had a good idea and understanding of proper sequence.  Pt will benefit from skilled therapeutic intervention to improve on the following deficits: Decreased knowledge of precautions, impaired UE functional use, pain, decreased ROM, postural dysfunction.   PT treatment/interventions: ADL/Self care home management, Patient/Family education, Manual therapy, re-evaluation, therapeutic exercise.    GOALS: Goals reviewed with patient? Yes  LONG TERM GOALS:  (STG=LTG)  GOALS Name Target Date  Goal status  1 Pt be ind with self bandaging for the UE 10/04/22 INITIAL  2 Pt will be ind with MLD or compression pump for edema management 10/04/22 INITIAL  3 Pt will obtain maximum reduction to obtain day and night compression 10/04/22 INITIAL          PLAN:  PT FREQUENCY/DURATION: 2xper week x 5 weeks (pt feels like he can manage bandaging with some instruction)  PLAN FOR NEXT SESSION:  measure, practice bandaging,Because he didn't like the cotton he may do better with rosidal/foam.  CDT Lt UE: Consider pump.   Sending demographics to [facility] 08/30/22  PHYSICAL THERAPY DISCHARGE SUMMARY  Visits from Start of Care: 6  Current functional level related to goals / functional outcomes: Per  above   Remaining deficits: lymphedema risk   Education / Equipment: Self care HEP   Plan: Patient agrees to discharge.        Waynette Buttery, PT 09/24/2022, 3:48 PM

## 2022-09-27 ENCOUNTER — Ambulatory Visit: Payer: BC Managed Care – PPO | Admitting: Rehabilitation

## 2022-09-27 ENCOUNTER — Ambulatory Visit
Admission: RE | Admit: 2022-09-27 | Discharge: 2022-09-27 | Disposition: A | Discharge status: Home / Self Care | Payer: BC Managed Care – PPO | Source: Ambulatory Visit | Attending: Hematology and Oncology | Admitting: Hematology and Oncology

## 2022-09-27 DIAGNOSIS — Z853 Personal history of malignant neoplasm of breast: Secondary | ICD-10-CM | POA: Diagnosis not present

## 2022-09-27 DIAGNOSIS — N62 Hypertrophy of breast: Secondary | ICD-10-CM | POA: Diagnosis not present

## 2022-09-27 DIAGNOSIS — Z170 Estrogen receptor positive status [ER+]: Secondary | ICD-10-CM

## 2022-10-01 ENCOUNTER — Ambulatory Visit: Payer: BC Managed Care – PPO | Admitting: Physical Therapy

## 2022-10-04 ENCOUNTER — Encounter: Payer: Self-pay | Admitting: Rehabilitation

## 2022-10-04 ENCOUNTER — Ambulatory Visit: Payer: BC Managed Care – PPO | Admitting: Rehabilitation

## 2022-10-10 ENCOUNTER — Ambulatory Visit: Payer: BC Managed Care – PPO

## 2022-10-10 DIAGNOSIS — I5022 Chronic systolic (congestive) heart failure: Secondary | ICD-10-CM

## 2022-10-11 NOTE — Progress Notes (Deleted)
Follow up visit  Subjective:   Michael Stuart, male    DOB: 1980/10/11, 42 y.o.   MRN: [id number]   HPI  No chief complaint on file.   42 year old Caucasian male, Michael Stuart with [employer], with left breast mass with axillary, mediastinal and hilar adenopathy suspicious for malignancy, HFrEF with possible myocarditis EF 20%, LV apical thrombus, cardioembolic stroke with mild petechial hemorrhage  Patient is doing well from cardiac standpoint. He underwent mastectomy in 02/2022, completed chemotherapy, will complete radiation therapy next week.     Current Outpatient Medications:    acetaminophen (TYLENOL) 500 MG tablet, Take 1,500 mg by mouth daily as needed for moderate pain or headache., Disp: , Rfl:    atorvastatin (LIPITOR) 20 MG tablet, TAKE 1 TABLET BY MOUTH EVERY DAY, Disp: 90 tablet, Rfl: 3   dapagliflozin propanediol (FARXIGA) 10 MG TABS tablet, Take 1 tablet by mouth daily., Disp: 90 tablet, Rfl: 2   eplerenone (INSPRA) 50 MG tablet, Take 1 tablet by mouth daily., Disp: 90 tablet, Rfl: 3   metoprolol succinate (TOPROL-XL) 50 MG 24 hr tablet, Take 1 tablet (50 mg total) by mouth daily., Disp: 90 tablet, Rfl: 3   sacubitril-valsartan (ENTRESTO) 97-103 MG, Take 1 tablet by mouth 2 (two) times daily., Disp: 60 tablet, Rfl: 11   tamoxifen (NOLVADEX) 20 MG tablet, Take 1 tablet (20 mg total) by mouth daily., Disp: 90 tablet, Rfl: 3   Cardiovascular & other pertient studies:  Reviewed external labs and tests, independently interpreted  Echocardiogram 10/10/2022:  Normal LV systolic function with visual EF 50-55%. Left ventricle cavity  is normal in size. Mild concentric hypertrophy of the left ventricle.  Normal global wall motion. Normal diastolic filling pattern, normal LAP.  Structurally normal tricuspid valve with trace regurgitation. No evidence  of pulmonary hypertension.  Compared to 11/2021, DD has now normalized.   EKG 12/14/2021: Sinus tachycardia 114 bpm  Left atrial  enlargement  MRI brain 09/01/2021: Patchy acute and subacute infarcts as described. Largest area of involvement is in the left frontal lobe corresponding to abnormality on CT where there is some petechial hemorrhage. Involvement of multiple vascular territories suggesting a central source. No intracranial mass.  LHC/RHC 08/28/2021: Normal coronary arteries without coronary artery disease   RA: 10 mmHg RV: 49/4 mmHg PA: 44/24 mmHg, mPAP 34 mmHg PCW: 22 mmHg   CO: 5.7 L/min CI: 2.3 L/min/m2   Decompensated nonischemic cardiomyopathy Resume IV heparin 2 hours after TR band is off given LV apical thrombus GDMT for heart failure and continued workup for breast mass  Echocardiogram 08/27/2021: 1. Left ventricular ejection fraction, by estimation, is 20 to 25%. The  left ventricle has severely decreased function. The left ventricle  demonstrates global hypokinesis. The left ventricular internal cavity size  was dilated. Left ventricular diastolic   parameters are consistent with Grade III diastolic dysfunction  (restrictive). Elevated left ventricular end-diastolic pressure.   2. Right ventricular systolic function is hyperdynamic. The right  ventricular size is mildly enlarged. dilated.   3. Left atrial size was mild to moderately dilated.   4. Right atrial size was dilated.   5. A small pericardial effusion is present. The pericardial effusion is  posterior to the left ventricle.   6. The mitral valve is grossly normal. Mild mitral valve regurgitation.  No evidence of mitral stenosis.   7. The aortic valve is tricuspid. Aortic valve regurgitation is not  visualized. No aortic stenosis is present.   8. There is mild dilatation  of the ascending aorta, measuring 40 mm.   9. The inferior vena cava is dilated in size with >50% respiratory  variability, suggesting right atrial pressure of 8 mmHg.   Recent labs: 06/11/2022: Glucose 105, BUN/Cr 14/1.03. EGFR >60. Na/K 136/4.7. Rest of  the CMP normal Chol 157, TG 204, HDL 64, LDL 42  02/22/2022: Glucose 129, BUN/Cr 14/0.82. EGFR >60. Na/K 135/4.2.  H/H 13/41. MCV 99. Platelets 196. WBC 15k  12/26/2021: Glucose 102, BUN/Cr 9/0.81. EGFR >60. Na/K 139/3.6. Rest of the CMP normal H/H 12/36. MCV 99. Platelets 339  Review of Systems  Constitutional: Positive for malaise/fatigue.  Cardiovascular:  Negative for chest pain, dyspnea on exertion, leg swelling, palpitations and syncope.         There were no vitals filed for this visit.    There is no height or weight on file to calculate BMI. There were no vitals filed for this visit.    Objective:   Physical Exam Vitals and nursing note reviewed.  Constitutional:      General: He is not in acute distress. Neck:     Vascular: No JVD.  Cardiovascular:     Rate and Rhythm: Normal rate and regular rhythm.     Heart sounds: Normal heart sounds. No murmur heard. Pulmonary:     Effort: Pulmonary effort is normal.     Breath sounds: Normal breath sounds. No wheezing or rales.  Musculoskeletal:     Right lower leg: No edema.     Left lower leg: No edema.             Visit diagnoses: No diagnosis found.     No orders of the defined types were placed in this encounter.   No orders of the defined types were placed in this encounter.    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 H/o viral illness in December 2022. Clinically improving, now NYHA class I-II. Currently on Entresto 49-51 mg bid, eplerenone 50 mg daily, metoprolol succinate 50 mg daily, Farxiga 10 mg daily EF increased to 50-55% (11/2021 & 10/2022).  *** I anticipate that if he  continues to do well from heart failure standpoint, we could consider down titrating his heart failure medications sometime in 2024, after he has completed chemotherapy, mastectomy, and radiation.  Cardioembolic stroke: Multifocal strokes secondary to LV apical thrombus, with mild petechial hemorrhage (08/2021), while on warfarin, thus on eliquis since then.  Patient completed 3 months of anticoagulation post stroke that occurred in the setting of systolic heart failure and LV thrombus. LVEF has now recovered. Anticoagulation discontinued (12/2021).  Breast cancer: Continue management as per Dr. Pamelia Hoit  F/u in 6 months    Elder Negus, MD Pager: [phone] Office: [phone]

## 2022-10-18 ENCOUNTER — Ambulatory Visit: Payer: BC Managed Care – PPO | Admitting: Cardiology

## 2022-10-25 ENCOUNTER — Ambulatory Visit: Payer: BC Managed Care – PPO | Admitting: Cardiology

## 2022-11-13 ENCOUNTER — Ambulatory Visit (HOSPITAL_COMMUNITY)
Admission: RE | Admit: 2022-11-13 | Discharge: 2022-11-13 | Disposition: A | Discharge status: Home / Self Care | Payer: BC Managed Care – PPO | Source: Ambulatory Visit | Attending: Hematology and Oncology | Admitting: Hematology and Oncology

## 2022-11-13 DIAGNOSIS — C50422 Malignant neoplasm of upper-outer quadrant of left male breast: Secondary | ICD-10-CM | POA: Diagnosis not present

## 2022-11-13 DIAGNOSIS — Z170 Estrogen receptor positive status [ER+]: Secondary | ICD-10-CM | POA: Diagnosis not present

## 2022-11-13 DIAGNOSIS — K760 Fatty (change of) liver, not elsewhere classified: Secondary | ICD-10-CM | POA: Diagnosis not present

## 2022-11-13 DIAGNOSIS — Z853 Personal history of malignant neoplasm of breast: Secondary | ICD-10-CM | POA: Diagnosis not present

## 2022-11-13 MED ORDER — IOHEXOL 300 MG/ML  SOLN
100.0000 mL | Freq: Once | INTRAMUSCULAR | Status: AC | PRN
  Administered 2022-11-13: 100 mL via INTRAVENOUS

## 2023-03-07 DIAGNOSIS — Z170 Estrogen receptor positive status [ER+]: Secondary | ICD-10-CM | POA: Diagnosis not present

## 2023-03-07 DIAGNOSIS — C50422 Malignant neoplasm of upper-outer quadrant of left male breast: Secondary | ICD-10-CM | POA: Diagnosis not present

## 2023-03-09 ENCOUNTER — Other Ambulatory Visit: Payer: Self-pay | Admitting: Cardiology

## 2023-03-27 ENCOUNTER — Telehealth: Payer: Self-pay

## 2023-03-27 NOTE — Telephone Encounter (Signed)
Voicemail was full. Was unable to leave a message. If pt calls back let pt know was calling about Signatera results. Results was negative.

## 2023-04-12 ENCOUNTER — Telehealth: Payer: Self-pay

## 2023-04-12 NOTE — Telephone Encounter (Signed)
Called pt per MD to advise Signatera testing was negative/not detected. Pt verbalized understanding of results and knows [facility] will be in touch to schedule 3 mo repeat lab.   

## 2023-04-15 ENCOUNTER — Encounter: Payer: Self-pay | Admitting: Hematology and Oncology

## 2023-06-03 DIAGNOSIS — C50422 Malignant neoplasm of upper-outer quadrant of left male breast: Secondary | ICD-10-CM | POA: Diagnosis not present

## 2023-06-03 DIAGNOSIS — Z170 Estrogen receptor positive status [ER+]: Secondary | ICD-10-CM | POA: Diagnosis not present

## 2023-06-11 ENCOUNTER — Other Ambulatory Visit: Payer: Self-pay | Admitting: Cardiology

## 2023-06-11 ENCOUNTER — Telehealth: Payer: Self-pay

## 2023-06-11 ENCOUNTER — Other Ambulatory Visit: Payer: Self-pay | Admitting: Hematology and Oncology

## 2023-06-11 DIAGNOSIS — M6740 Ganglion, unspecified site: Secondary | ICD-10-CM | POA: Diagnosis not present

## 2023-06-11 DIAGNOSIS — B078 Other viral warts: Secondary | ICD-10-CM | POA: Diagnosis not present

## 2023-06-11 DIAGNOSIS — L720 Epidermal cyst: Secondary | ICD-10-CM | POA: Diagnosis not present

## 2023-06-11 NOTE — Telephone Encounter (Signed)
Placed call to pt with Signatera results and offered MD telephone visit to further discuss. He accepted an appt for 12/4 at 0945.

## 2023-06-12 ENCOUNTER — Inpatient Hospital Stay: Payer: BC Managed Care – PPO | Attending: Hematology and Oncology | Admitting: Hematology and Oncology

## 2023-06-12 DIAGNOSIS — C50422 Malignant neoplasm of upper-outer quadrant of left male breast: Secondary | ICD-10-CM | POA: Diagnosis not present

## 2023-06-12 DIAGNOSIS — D485 Neoplasm of uncertain behavior of skin: Secondary | ICD-10-CM | POA: Diagnosis not present

## 2023-06-12 DIAGNOSIS — Z170 Estrogen receptor positive status [ER+]: Secondary | ICD-10-CM | POA: Diagnosis not present

## 2023-06-12 DIAGNOSIS — H00034 Abscess of left upper eyelid: Secondary | ICD-10-CM | POA: Diagnosis not present

## 2023-06-12 DIAGNOSIS — H0279 Other degenerative disorders of eyelid and periocular area: Secondary | ICD-10-CM | POA: Diagnosis not present

## 2023-06-12 NOTE — Progress Notes (Signed)
HEMATOLOGY-ONCOLOGY TELEPHONE VISIT PROGRESS NOTE  I connected with our patient on 06/12/23 at  9:45 AM EST by telephone and verified that I am speaking with the correct person using two identifiers.  I discussed the limitations, risks, security and privacy concerns of performing an evaluation and management service by telephone and the availability of in person appointments.  I also discussed with the patient that there may be a patient responsible charge related to this service. The patient expressed understanding and agreed to proceed.   History of Present Illness: Follow-up to discuss result of Signatera test  History of Present Illness   The patient, with a history of cancer, presents after a positive Signatera test. He expresses concern about the result, questioning the threshold for the test and the implications of a positive result. The patient's result was 0.3, which is considered very low but still abnormal. He reports adherence to his medication regimen, including tamoxifen, and denies missing any doses. He also expresses interest in repeating the Signatera test sooner than the scheduled date in March.        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 CT 08/27/2021 in ED showed 3.8 centimeter left breast mass with left axillary lymph nodes.Mamm and [place]: 4 cm mass with 5 axillary lymph nodes: Biopsy: Grade 2 IDC, lymph node: Positive, ER 100%, PR 50%, Ki-67 25%, HER2 negative ratio 1.25, copy #3.45   09/25/2021 Cancer Staging   Staging form: Breast, AJCC 8th Edition - Clinical: Stage IIA (cT2, cN1, cM0, G2, ER+, PR+, HER2-) - Signed by Serena Croissant, MD on 09/25/2021 Histologic grading system: 3 grade system   10/03/2021 - 01/18/2022 Chemotherapy   Patient is on Treatment Plan : BREAST TC q21d     01/22/2022 Genetic Testing   Negative genetic  testing on the CancerNext-Expanded+RNAinsight.  The report date is January 22, 2022.  The CancerNext-Expanded gene panel offered by [facility] and includes sequencing and rearrangement analysis for the following 77 genes: AIP, ALK, APC*, ATM*, AXIN2, BAP1, BARD1, BLM, BMPR1A, BRCA1*, BRCA2*, BRIP1*, CDC73, CDH1*, CDK4, CDKN1B, CDKN2A, CHEK2*, CTNNA1, DICER1, FANCC, FH, FLCN, GALNT12, KIF1B, LZTR1, MAX, MEN1, MET, MLH1*, MSH2*, MSH3, MSH6*, MUTYH*, NBN, NF1*, NF2, NTHL1, PALB2*, PHOX2B, PMS2*, POT1, PRKAR1A, PTCH1, PTEN*, RAD51C*, RAD51D*, RB1, RECQL, RET, SDHA, SDHAF2, SDHB, SDHC, SDHD, SMAD4, SMARCA4, SMARCB1, SMARCE1, STK11, SUFU, TMEM127, TP53*, TSC1, TSC2, VHL and XRCC2 (sequencing and deletion/duplication); EGFR, EGLN1, HOXB13, KIT, MITF, PDGFRA, POLD1, and POLE (sequencing only); EPCAM and GREM1 (deletion/duplication only). DNA and RNA analyses performed for * genes.      REVIEW OF SYSTEMS:   Constitutional: Denies fevers, chills or abnormal weight loss All other systems were reviewed with the patient and are negative. Observations/Objective:  09/15/2021:Palpable left breast mass x8 months. CT 08/27/2021 in ED showed 3.8 cm left breast mass with left axillary lymph nodes.Mamm and [place]: 4 cm mass with 5 axillary lymph nodes: Biopsy: Grade 2 IDC, lymph node: Positive, ER 100%, PR 50%, Ki-67 25%, HER2 negative ratio 1.25, copy #3.45 (Recent history of coronary artery disease EF 22% and stroke)   Treatment plan: 1. Neoadjuvant chemotherapy with Taxotere and Cytoxan every 3 weeks x6 cycles completed 01/16/2022 2. left mastectomy: Residual grade 2 IDC 3.5 cm, margins negative, 2/2 lymph nodes positive, angiolymphatic invasion present, ER 100%, PR 50%, HER2 negative, Ki-67 25% 3. Followed by adjuvant radiation therapy 04/03/22-05/16/22 4.  Followed  by adjuvant antiestrogen therapy +/- CDK 4 and 6 inhibitor CT CAP and bone scan: No distant metastatic  disease ---------------------------------------------------------------------------------------------------------------------------------- Tamoxifen toxicities: Hot Flashes   Assessment Plan:       Breast Cancer Signatera test positive (0.3) indicating possible recurrence. Last scans were six months ago. Patient is compliant with Tamoxifen. -Order CT scan and bone scan in the next couple of weeks to assess for possible metastasis. -Continue Tamoxifen as prescribed. -Discuss possibility of repeating Signatera test after scan results.          I discussed the assessment and treatment plan with the patient. The patient was provided an opportunity to ask questions and all were answered. The patient agreed with the plan and demonstrated an understanding of the instructions. The patient was advised to call back or seek an in-person evaluation if the symptoms worsen or if the condition fails to improve as anticipated.   I provided 12 minutes of non-face-to-face time during this encounter.  This includes time for charting and coordination of care   Tamsen Meek, MD

## 2023-06-13 ENCOUNTER — Other Ambulatory Visit: Payer: Self-pay

## 2023-06-13 ENCOUNTER — Encounter (HOSPITAL_BASED_OUTPATIENT_CLINIC_OR_DEPARTMENT_OTHER): Payer: Self-pay | Admitting: Emergency Medicine

## 2023-06-13 ENCOUNTER — Inpatient Hospital Stay (HOSPITAL_BASED_OUTPATIENT_CLINIC_OR_DEPARTMENT_OTHER)
Admission: EM | Admit: 2023-06-13 | Discharge: 2023-06-15 | DRG: 862 | Disposition: A | Discharge status: Home / Self Care | Payer: BC Managed Care – PPO | Attending: Family Medicine | Admitting: Family Medicine

## 2023-06-13 ENCOUNTER — Emergency Department (HOSPITAL_BASED_OUTPATIENT_CLINIC_OR_DEPARTMENT_OTHER): Payer: BC Managed Care – PPO

## 2023-06-13 DIAGNOSIS — I252 Old myocardial infarction: Secondary | ICD-10-CM

## 2023-06-13 DIAGNOSIS — Z7901 Long term (current) use of anticoagulants: Secondary | ICD-10-CM | POA: Diagnosis not present

## 2023-06-13 DIAGNOSIS — Z881 Allergy status to other antibiotic agents status: Secondary | ICD-10-CM

## 2023-06-13 DIAGNOSIS — I2510 Atherosclerotic heart disease of native coronary artery without angina pectoris: Secondary | ICD-10-CM | POA: Diagnosis not present

## 2023-06-13 DIAGNOSIS — L0390 Cellulitis, unspecified: Secondary | ICD-10-CM | POA: Diagnosis not present

## 2023-06-13 DIAGNOSIS — F17290 Nicotine dependence, other tobacco product, uncomplicated: Secondary | ICD-10-CM | POA: Diagnosis not present

## 2023-06-13 DIAGNOSIS — H40053 Ocular hypertension, bilateral: Secondary | ICD-10-CM | POA: Diagnosis present

## 2023-06-13 DIAGNOSIS — H05019 Cellulitis of unspecified orbit: Secondary | ICD-10-CM | POA: Diagnosis not present

## 2023-06-13 DIAGNOSIS — Z853 Personal history of malignant neoplasm of breast: Secondary | ICD-10-CM | POA: Diagnosis not present

## 2023-06-13 DIAGNOSIS — T8144XA Sepsis following a procedure, initial encounter: Secondary | ICD-10-CM | POA: Diagnosis present

## 2023-06-13 DIAGNOSIS — Z9221 Personal history of antineoplastic chemotherapy: Secondary | ICD-10-CM

## 2023-06-13 DIAGNOSIS — E7800 Pure hypercholesterolemia, unspecified: Secondary | ICD-10-CM | POA: Diagnosis not present

## 2023-06-13 DIAGNOSIS — Z882 Allergy status to sulfonamides status: Secondary | ICD-10-CM | POA: Diagnosis not present

## 2023-06-13 DIAGNOSIS — Z923 Personal history of irradiation: Secondary | ICD-10-CM | POA: Diagnosis not present

## 2023-06-13 DIAGNOSIS — Z79810 Long term (current) use of selective estrogen receptor modulators (SERMs): Secondary | ICD-10-CM | POA: Diagnosis not present

## 2023-06-13 DIAGNOSIS — I5022 Chronic systolic (congestive) heart failure: Secondary | ICD-10-CM | POA: Diagnosis present

## 2023-06-13 DIAGNOSIS — Z170 Estrogen receptor positive status [ER+]: Secondary | ICD-10-CM | POA: Diagnosis not present

## 2023-06-13 DIAGNOSIS — L03213 Periorbital cellulitis: Principal | ICD-10-CM | POA: Diagnosis present

## 2023-06-13 DIAGNOSIS — Z79899 Other long term (current) drug therapy: Secondary | ICD-10-CM

## 2023-06-13 DIAGNOSIS — Z9012 Acquired absence of left breast and nipple: Secondary | ICD-10-CM

## 2023-06-13 DIAGNOSIS — R220 Localized swelling, mass and lump, head: Secondary | ICD-10-CM | POA: Diagnosis not present

## 2023-06-13 DIAGNOSIS — T8141XA Infection following a procedure, superficial incisional surgical site, initial encounter: Principal | ICD-10-CM | POA: Diagnosis present

## 2023-06-13 DIAGNOSIS — A419 Sepsis, unspecified organism: Secondary | ICD-10-CM | POA: Diagnosis not present

## 2023-06-13 HISTORY — DX: Malignant neoplasm of unspecified site of unspecified female breast: C50.919

## 2023-06-13 LAB — COMPREHENSIVE METABOLIC PANEL
ALT: 31 U/L (ref 0–44)
AST: 20 U/L (ref 15–41)
Albumin: 4.3 g/dL (ref 3.5–5.0)
Alkaline Phosphatase: 64 U/L (ref 38–126)
Anion gap: 11 (ref 5–15)
BUN: 10 mg/dL (ref 6–20)
CO2: 24 mmol/L (ref 22–32)
Calcium: 9.4 mg/dL (ref 8.9–10.3)
Chloride: 103 mmol/L (ref 98–111)
Creatinine, Ser: 1.03 mg/dL (ref 0.61–1.24)
GFR, Estimated: >60 mL/min (ref >60)
Glucose, Bld: 117 mg/dL — ABNORMAL HIGH (ref 70–99)
Potassium: 3.9 mmol/L (ref 3.5–5.1)
Sodium: 138 mmol/L (ref 135–145)
Total Bilirubin: 0.5 mg/dL (ref <1.2)
Total Protein: 7.3 g/dL (ref 6.5–8.1)

## 2023-06-13 LAB — CBC WITH DIFFERENTIAL/PLATELET
Abs Immature Granulocytes: 0.09 10*3/uL — ABNORMAL HIGH (ref 0.00–0.07)
Basophils Absolute: 0.1 10*3/uL (ref 0.0–0.1)
Basophils Relative: 1 %
Eosinophils Absolute: 0.2 10*3/uL (ref 0.0–0.5)
Eosinophils Relative: 1 %
HCT: 45.8 % (ref 39.0–52.0)
Hemoglobin: 15.3 g/dL (ref 13.0–17.0)
Immature Granulocytes: 1 %
Lymphocytes Relative: 11 %
Lymphs Abs: 1.6 10*3/uL (ref 0.7–4.0)
MCH: 31.4 pg (ref 26.0–34.0)
MCHC: 33.4 g/dL (ref 30.0–36.0)
MCV: 93.9 fL (ref 80.0–100.0)
Monocytes Absolute: 1.1 10*3/uL — ABNORMAL HIGH (ref 0.1–1.0)
Monocytes Relative: 8 %
Neutro Abs: 11.6 10*3/uL — ABNORMAL HIGH (ref 1.7–7.7)
Neutrophils Relative %: 78 %
Platelets: 246 10*3/uL (ref 150–400)
RBC: 4.88 MIL/uL (ref 4.22–5.81)
RDW: 13.2 % (ref 11.5–15.5)
WBC: 14.7 10*3/uL — ABNORMAL HIGH (ref 4.0–10.5)
nRBC: 0 % (ref 0.0–0.2)

## 2023-06-13 LAB — URINALYSIS, W/ REFLEX TO CULTURE (INFECTION SUSPECTED)
Bacteria, UA: NONE SEEN
Bilirubin Urine: NEGATIVE
Glucose, UA: NEGATIVE mg/dL
Hgb urine dipstick: NEGATIVE
Ketones, ur: NEGATIVE mg/dL
Leukocytes,Ua: NEGATIVE
Nitrite: NEGATIVE
Protein, ur: NEGATIVE mg/dL
Specific Gravity, Urine: >1.046 — ABNORMAL HIGH (ref 1.005–1.030)
pH: 5.5 (ref 5.0–8.0)

## 2023-06-13 LAB — PROTIME-INR
INR: 1.1 (ref 0.8–1.2)
Prothrombin Time: 14 s (ref 11.4–15.2)

## 2023-06-13 LAB — LACTIC ACID, PLASMA: Lactic Acid, Venous: 1.7 mmol/L (ref 0.5–1.9)

## 2023-06-13 MED ORDER — VANCOMYCIN HCL IN DEXTROSE 1-5 GM/200ML-% IV SOLN
1000.0000 mg | Freq: Once | INTRAVENOUS | Status: AC
  Administered 2023-06-13: 1000 mg via INTRAVENOUS
  Filled 2023-06-13: qty 200

## 2023-06-13 MED ORDER — SODIUM CHLORIDE 0.9 % IV SOLN
1.0000 g | Freq: Once | INTRAVENOUS | Status: AC
  Administered 2023-06-13: 1 g via INTRAVENOUS
  Filled 2023-06-13: qty 10

## 2023-06-13 MED ORDER — METOPROLOL SUCCINATE ER 50 MG PO TB24
50.0000 mg | ORAL_TABLET | Freq: Every day | ORAL | Status: DC
Start: 2023-06-14 — End: 2023-06-15
  Administered 2023-06-14 – 2023-06-15 (×2): 50 mg via ORAL
  Filled 2023-06-13 (×2): qty 1

## 2023-06-13 MED ORDER — SENNOSIDES-DOCUSATE SODIUM 8.6-50 MG PO TABS: 1.0000 | ORAL_TABLET | Freq: Every evening | ORAL | Status: DC | PRN

## 2023-06-13 MED ORDER — ENOXAPARIN SODIUM 40 MG/0.4ML IJ SOSY
40.0000 mg | PREFILLED_SYRINGE | INTRAMUSCULAR | Status: DC
  Filled 2023-06-13: qty 0.4

## 2023-06-13 MED ORDER — SPIRONOLACTONE 25 MG PO TABS
25.0000 mg | ORAL_TABLET | Freq: Every day | ORAL | Status: DC
  Administered 2023-06-14 – 2023-06-15 (×2): 25 mg via ORAL
  Filled 2023-06-13 (×2): qty 1

## 2023-06-13 MED ORDER — MELATONIN 3 MG PO TABS
3.0000 mg | ORAL_TABLET | Freq: Every evening | ORAL | Status: DC | PRN
  Administered 2023-06-13: 3 mg via ORAL
  Filled 2023-06-13: qty 1

## 2023-06-13 MED ORDER — FENTANYL CITRATE PF 50 MCG/ML IJ SOSY
50.0000 ug | PREFILLED_SYRINGE | INTRAMUSCULAR | Status: DC | PRN
  Administered 2023-06-13: 50 ug via INTRAVENOUS
  Filled 2023-06-13: qty 1

## 2023-06-13 MED ORDER — IBUPROFEN 200 MG PO TABS: 400.0000 mg | ORAL_TABLET | Freq: Four times a day (QID) | ORAL | Status: DC | PRN

## 2023-06-13 MED ORDER — ACETAMINOPHEN 650 MG RE SUPP: 650.0000 mg | Freq: Four times a day (QID) | RECTAL | Status: DC | PRN

## 2023-06-13 MED ORDER — ONDANSETRON HCL 4 MG PO TABS: 4.0000 mg | ORAL_TABLET | Freq: Four times a day (QID) | ORAL | Status: DC | PRN

## 2023-06-13 MED ORDER — ONDANSETRON HCL 4 MG/2ML IJ SOLN: 4.0000 mg | Freq: Four times a day (QID) | INTRAMUSCULAR | Status: DC | PRN

## 2023-06-13 MED ORDER — ATORVASTATIN CALCIUM 10 MG PO TABS
20.0000 mg | ORAL_TABLET | Freq: Every day | ORAL | Status: DC
  Administered 2023-06-14 – 2023-06-15 (×2): 20 mg via ORAL
  Filled 2023-06-13 (×2): qty 2

## 2023-06-13 MED ORDER — LACTATED RINGERS IV BOLUS (SEPSIS)
1000.0000 mL | Freq: Once | INTRAVENOUS | Status: AC
Start: 2023-06-13 — End: 2023-06-13
  Administered 2023-06-13: 1000 mL via INTRAVENOUS

## 2023-06-13 MED ORDER — VANCOMYCIN HCL IN DEXTROSE 1-5 GM/200ML-% IV SOLN
1000.0000 mg | Freq: Once | INTRAVENOUS | Status: AC
Start: 2023-06-13 — End: 2023-06-13
  Administered 2023-06-13: 1000 mg via INTRAVENOUS
  Filled 2023-06-13: qty 200

## 2023-06-13 MED ORDER — SACUBITRIL-VALSARTAN 97-103 MG PO TABS
1.0000 | ORAL_TABLET | Freq: Two times a day (BID) | ORAL | Status: DC
Start: 2023-06-13 — End: 2023-06-15
  Administered 2023-06-13 – 2023-06-15 (×4): 1 via ORAL
  Filled 2023-06-13 (×5): qty 1

## 2023-06-13 MED ORDER — ACETAMINOPHEN 325 MG PO TABS: 650.0000 mg | ORAL_TABLET | Freq: Four times a day (QID) | ORAL | Status: DC | PRN

## 2023-06-13 MED ORDER — VANCOMYCIN HCL 1.5 G IV SOLR
1500.0000 mg | Freq: Once | INTRAVENOUS | Status: AC
  Administered 2023-06-13: 1500 mg via INTRAVENOUS
  Filled 2023-06-13: qty 30

## 2023-06-13 MED ORDER — SODIUM CHLORIDE 0.9 % IV SOLN
2.0000 g | INTRAVENOUS | Status: DC
  Administered 2023-06-14 – 2023-06-15 (×2): 2 g via INTRAVENOUS
  Filled 2023-06-13 (×2): qty 20

## 2023-06-13 MED ORDER — DAPAGLIFLOZIN PROPANEDIOL 10 MG PO TABS
10.0000 mg | ORAL_TABLET | Freq: Every day | ORAL | Status: DC
  Administered 2023-06-14 – 2023-06-15 (×2): 10 mg via ORAL
  Filled 2023-06-13 (×2): qty 1

## 2023-06-13 MED ORDER — SODIUM CHLORIDE 0.9 % IV SOLN
1500.0000 mg | Freq: Two times a day (BID) | INTRAVENOUS | Status: DC
  Administered 2023-06-14 – 2023-06-15 (×3): 1500 mg via INTRAVENOUS
  Filled 2023-06-13 (×3): qty 30

## 2023-06-13 MED ORDER — MORPHINE SULFATE (PF) 2 MG/ML IV SOLN: 1.0000 mg | INTRAVENOUS | Status: DC | PRN

## 2023-06-13 MED ORDER — IOHEXOL 300 MG/ML  SOLN
100.0000 mL | Freq: Once | INTRAMUSCULAR | Status: AC | PRN
Start: 2023-06-13 — End: 2023-06-13
  Administered 2023-06-13: 75 mL via INTRAVENOUS

## 2023-06-13 MED ORDER — HYDROCODONE-ACETAMINOPHEN 5-325 MG PO TABS
1.0000 | ORAL_TABLET | ORAL | Status: DC | PRN
Start: 2023-06-13 — End: 2023-06-15
  Administered 2023-06-13: 1 via ORAL
  Administered 2023-06-14 – 2023-06-15 (×3): 2 via ORAL
  Filled 2023-06-13: qty 2
  Filled 2023-06-13: qty 1
  Filled 2023-06-13 (×2): qty 2

## 2023-06-13 NOTE — H&P (Signed)
History and Physical    Michael Stuart NWG:[id number] DOB: 1980-08-25 DOA: 06/13/2023  PCP: Pcp, No  Patient coming from: Home  I have personally briefly reviewed patient's old medical records in [facility]  Chief Complaint: Left eye swelling  HPI: Michael Stuart is a 42 y.o. 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  Patient states that he had a small bump to his lateral left upper eyelid that had been present for about 4 months.  2 days ago he noticed that he had significantly increased swelling to his left upper eyelid.  He says he was seen by oculoplastics yesterday 06/12/2023 at [facility] where Dr. Delynn Flavin performed an I&D.  He says when he woke up this morning that his eye was swollen shut.  He called [facility] and was advised to come to the ED for further management.  Upon [facility] floor patient states that the swelling and pain to his left upper eyelid seems to be improving.  He can now slightly open his eyelid which he was unable to do this morning.  He otherwise denies fevers, chills, diaphoresis, headache, chest pain, dyspnea, peripheral edema.  He reports 1 episode of nausea with emesis this morning but none since.  [facility] ED Course  Labs/Imaging on admission: I have personally reviewed following labs and imaging studies.  Initial vitals showed BP 157/103, pulse 134, RR 22, temp 100.7 F, SpO2 95% on room air.  Labs showed WBC 14.7, hemoglobin 15.3, platelets 246,000, sodium 138, potassium 3.9, bicarb 24, BUN 10, creatinine 1.03, serum glucose 117, LFTs within normal limits, lactic acid 1.7, INR 1.1.  UA negative for UTI.  Single  blood culture in process.  CT orbits with contrast showed moderate to severe left periorbital soft tissue swelling compatible with preseptal cellulitis.  No postseptal involvement.  1.9 cm phlegmon area lateral to the orbit.  No soft tissue gas or drainable fluid.  Unrelated appearing paranasal sinus inflammation, stable to improved from last year.  Patient was given 1 L LR, IV vancomycin and ceftriaxone.  Ophthalmology were consulted and patient was evaluated by Dr. Coralee North in the ED.  He agreed with findings of preseptal cellulitis and recommended 24-48 hours of parenteral antibiotics.  The hospitalist service was consulted to admit.  Review of Systems: All systems reviewed and are negative except as documented in history of present illness above.   Past Medical History:  Diagnosis Date   Breast cancer (HCC)    CHF (congestive heart failure) (HCC)    Myocardial infarction (HCC)    Pneumonia     Past Surgical History:  Procedure Laterality Date   IR IMAGING GUIDED PORT INSERTION  09/28/2021   MASTECTOMY W/ SENTINEL NODE BIOPSY Left 02/21/2022   Procedure: LEFT SIMPLE MASTECTOMY WITH LEFT TARGETED LYMPH NODE BIOPSY;  Surgeon: Harriette Bouillon, MD;  Location: [facility] OR;  Service: General;  Laterality: Left;  GEN w/ PEC BLOCK   PORT-A-CATH REMOVAL Right 02/21/2022   Procedure: PORT REMOVAL;  Surgeon: Harriette Bouillon, MD;  Location: [facility] OR;  Service: General;  Laterality: Right;   RIGHT/LEFT HEART CATH AND CORONARY ANGIOGRAPHY N/A 08/28/2021   Procedure: RIGHT/LEFT HEART CATH AND CORONARY ANGIOGRAPHY;  Surgeon: Truett Mainland  J, MD;  Location: [facility] INVASIVE CV LAB;  Service: Cardiovascular;  Laterality: N/A;    Social History:  reports that he quit smoking about 2 years ago. His smoking use included cigarettes. He started smoking about 27 years ago. He has a 6.3 pack-year smoking history. He uses smokeless tobacco. He reports current alcohol use of about 21.0 standard drinks of  alcohol per week. He reports that he does not currently use drugs after having used the following drugs: Marijuana. Frequency: 1.00 time per week.  Allergies  Allergen Reactions   Bactrim [Sulfamethoxazole-Trimethoprim] Other (See Comments)    Blood Capillaries Rupture    Sulfacetamide Sodium Other (See Comments)    Family History  Problem Relation Age of Onset   Healthy Mother      Prior to Admission medications   Medication Sig Start Date End Date Taking? Authorizing Provider  atorvastatin (LIPITOR) 20 MG tablet TAKE 1 TABLET BY MOUTH EVERY DAY 09/03/22  Yes Patwardhan, Manish J, MD  dapagliflozin propanediol (FARXIGA) 10 MG TABS tablet Take 1 tablet by mouth daily. 05/11/22  Yes Patwardhan, Manish J, MD  doxycycline (VIBRA-TABS) 100 MG tablet Take 100 mg by mouth 2 (two) times daily. 06/11/23  Yes [provider]  eplerenone (INSPRA) 50 MG tablet Take 1 tablet by mouth daily. 05/11/22  Yes Patwardhan, Manish J, MD  metoprolol succinate (TOPROL-XL) 50 MG 24 hr tablet Take 1 tablet (50 mg total) by mouth daily. 05/11/22  Yes Patwardhan, Anabel Bene, MD  neomycin-polymyxin b-dexamethasone (MAXITROL) 3.5-10000-0.1 OINT Place 1 Application into both eyes. 06/12/23  Yes [provider]  sacubitril-valsartan (ENTRESTO) 97-103 MG Take 1 tablet by mouth 2 (two) times daily. 06/11/22  Yes Laurey Morale, MD  tamoxifen (NOLVADEX) 20 MG tablet TAKE 1 TABLET(20 MG) BY MOUTH DAILY 06/11/23  Yes Serena Croissant, MD  acetaminophen (TYLENOL) 500 MG tablet Take 1,500 mg by mouth daily as needed for moderate pain or headache.    [provider]  apixaban (ELIQUIS) 5 MG TABS tablet Take 1 tablet by mouth 2  times daily. 10/02/21 12/20/21  Elder Negus, MD    Physical Exam: Vitals:   06/13/23 1606 06/13/23 1920 06/13/23 1920 06/13/23 2024  BP: (!) 157/97 (!) 160/90  (!) 153/119  Pulse: (!) 110 (!) 107  (!) 109  Resp: 16  16 18   Temp: 98.3 F (36.8 C)  99.9 F (37.7 C) 100.1  F (37.8 C)  TempSrc: Oral  Oral Oral  SpO2: 99% 100%  98%   Constitutional: Resting in bed, NAD, calm, comfortable Eyes: Significant swelling and erythema left upper eyelid greater than left lower eyelid with I&D incision site left lateral upper eyelid as pictured below.  Patient can slightly open left eyelids. ENMT: Mucous membranes are moist. Posterior pharynx clear of any exudate or lesions.Normal dentition.  Neck: normal, supple, no masses. Respiratory: clear to auscultation bilaterally, no wheezing, no crackles. Normal respiratory effort. No accessory muscle use.  Cardiovascular: Regular rate and rhythm, no murmurs / rubs / gallops. No extremity edema. 2+ pedal pulses. Abdomen: no tenderness, no masses palpated.  Musculoskeletal: no clubbing / cyanosis. No joint deformity upper and lower extremities. Good ROM, no contractures. Normal muscle tone.  Skin: Significant swelling and erythema left upper eyelid greater than left lower eyelid with I&D incision site left lateral upper eyelid as pictured below.  Neurologic: Sensation intact. Strength 5/5 in all 4.  Psychiatric: Normal judgment and insight. Alert and oriented x 3. Normal mood.  EKG: Not performed.  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  Assessment and Plan: Sepsis due to left preseptal  cellulitis: Presenting with leukocytosis, tachycardia, fever in setting of left preseptal cellulitis after undergoing outpatient I&D to left upper eyelid cyst on 06/12/2023. -Evaluated by ophthalmology Dr. Nada Libman -Recommended continued IV antibiotics 24-48 hours and if continues to improve can transition to oral tx -Continue on IV vancomycin and ceftriaxone  Chronic systolic CHF: Appears euvolemic.  EF improved to 50-55% from previous of 20%. -Continue home Entresto, Toprol-XL, eplerenone, Farxiga  Hyperlipidemia: Continue atorvastatin.  Left breast cancer: Follows with oncology Dr. Pamelia Hoit.  S/p left mastectomy, neoadjuvant chemotherapy and adjuvant radiation.  Currently managed on tamoxifen.  Further testing planned with bone scan and CT chest/abdomen/pelvis per his oncologist for evaluation of metastases.   DVT prophylaxis: enoxaparin (LOVENOX) injection 40 mg Start: 06/14/23 2200 Code Status: Full code, confirmed with patient on admission Family Communication: Discussed with patient, he has discussed with family Disposition Plan: From home and likely discharge to home pending clinical progress Consults called: Ophthalmology Severity of Illness: The appropriate patient status for this patient is INPATIENT. Inpatient status is judged to be reasonable and necessary in order to provide the required intensity of service to ensure the patient's safety. The patient's presenting symptoms, physical exam findings, and initial radiographic and laboratory data in the context of their chronic comorbidities is felt to place them at high risk for further clinical deterioration. Furthermore, it is not anticipated that the patient will be medically stable for discharge from the hospital within 2 midnights of admission.   * I certify that at the point of admission it is my clinical judgment that the patient will require inpatient hospital care spanning beyond 2 midnights from the point of admission due to  high intensity of service, high risk for further deterioration and high frequency of surveillance required.Darreld Mclean MD [facility]  If 7PM-7AM, please contact night-coverage [email or web address]  06/13/2023, 9:40 PM

## 2023-06-13 NOTE — ED Provider Notes (Signed)
[place] EMERGENCY DEPARTMENT AT [facility] Provider Note   CSN: [id number] Arrival date & time: 06/13/23  1308     History  Chief Complaint  Patient presents with   Facial Swelling    Michael Stuart is a 42 y.o. male.  HPI    42 year old male with previous history of breast cancer, CAD, currently cancer free and not on any immunosuppressive comes in with chief complaint of facial swelling.  Patient states that he noticed a cyst last week or his left eye.  Subsequently he followed up with dermatologist and had a cyst removal procedure done yesterday by oculoplastics.  He woke up today and the swelling was significantly worse.  The redness is also spread slightly.  He does not have any pain to his eye.  Review of system also positive for fevers that started today along with some clamminess.  Home Medications Prior to Admission medications   Medication Sig Start Date End Date Taking? Authorizing Provider  acetaminophen (TYLENOL) 500 MG tablet Take 1,500 mg by mouth daily as needed for moderate pain or headache.   Yes [provider]  atorvastatin (LIPITOR) 20 MG tablet TAKE 1 TABLET BY MOUTH EVERY DAY 09/03/22  Yes Patwardhan, Manish J, MD  dapagliflozin propanediol (FARXIGA) 10 MG TABS tablet Take 1 tablet by mouth daily. 05/11/22  Yes Patwardhan, Manish J, MD  doxycycline (VIBRA-TABS) 100 MG tablet Take 100 mg by mouth 2 (two) times daily. 06/11/23  Yes [provider]  eplerenone (INSPRA) 50 MG tablet Take 1 tablet by mouth daily. 05/11/22  Yes Patwardhan, Manish J, MD  metoprolol succinate (TOPROL-XL) 50 MG 24 hr tablet Take 1 tablet (50 mg total) by mouth daily. 05/11/22  Yes Patwardhan, Anabel Bene, MD  neomycin-polymyxin b-dexamethasone (MAXITROL) 3.5-10000-0.1 OINT Place 1 Application into both eyes. 06/12/23  Yes [provider]  sacubitril-valsartan (ENTRESTO) 97-103 MG Take 1 tablet by mouth 2 (two) times daily. 06/11/22  Yes Laurey Morale, MD   tamoxifen (NOLVADEX) 20 MG tablet TAKE 1 TABLET(20 MG) BY MOUTH DAILY 06/11/23  Yes Serena Croissant, MD  apixaban (ELIQUIS) 5 MG TABS tablet Take 1 tablet by mouth 2  times daily. 10/02/21 12/20/21  Elder Negus, MD      Allergies    Bactrim [sulfamethoxazole-trimethoprim] and Sulfacetamide sodium    Review of Systems   Review of Systems  All other systems reviewed and are negative.   Physical Exam Updated Vital Signs BP (!) 139/97 (BP Location: Right Arm)   Pulse 99   Temp 98.8 F (37.1 C) (Oral)   Resp 20   Ht 6\' 3"  (1.905 m)   Wt (!) 144.5 kg   SpO2 97%   BMI 39.82 kg/m  Physical Exam Vitals and nursing note reviewed.  Constitutional:      Appearance: He is well-developed.  HENT:     Head: Atraumatic.  Cardiovascular:     Rate and Rhythm: Normal rate.  Pulmonary:     Effort: Pulmonary effort is normal.  Musculoskeletal:        General: Swelling present.     Cervical back: Neck supple.  Skin:    General: Skin is warm.     Findings: Erythema present.  Neurological:     Mental Status: He is alert and oriented to person, place, and time.        ED Results / Procedures / Treatments   Labs (all labs ordered are listed, but only abnormal results are displayed) Labs Reviewed  COMPREHENSIVE METABOLIC PANEL - Abnormal; Notable for the following components:      Result Value   Glucose, Bld 117 (*)    All other components within normal limits  CBC WITH DIFFERENTIAL/PLATELET - Abnormal; Notable for the following components:   WBC 14.7 (*)    Neutro Abs 11.6 (*)    Monocytes Absolute 1.1 (*)    Abs Immature Granulocytes 0.09 (*)    All other components within normal limits  URINALYSIS, W/ REFLEX TO CULTURE (INFECTION SUSPECTED) - Abnormal; Notable for the following components:   Specific Gravity, Urine >1.046 (*)    All other components within normal limits  BASIC METABOLIC PANEL - Abnormal; Notable for the following components:   Glucose, Bld 131 (*)     Calcium 8.5 (*)    All other components within normal limits  CULTURE, BLOOD (ROUTINE X 2)  LACTIC ACID, PLASMA  PROTIME-INR  CBC  HIV ANTIBODY (ROUTINE TESTING W REFLEX)    EKG None  Radiology CT Orbits W Contrast  Result Date: 06/13/2023 CLINICAL DATA:  42 year old male with orbital cellulitis, with a small draining skin lesion above the left eye yesterday. EXAM: CT ORBITS WITH CONTRAST TECHNIQUE: Multidetector CT images was performed according to the standard protocol following intravenous contrast administration. RADIATION DOSE REDUCTION: This exam was performed according to the departmental dose-optimization program which includes automated exposure control, adjustment of the mA and/or kV according to patient size and/or use of iterative reconstruction technique. CONTRAST:  75mL OMNIPAQUE IOHEXOL 300 MG/ML  SOLN COMPARISON:  Brain MRI 09/01/2021. FINDINGS: Orbits: Intact orbital walls. Globes appear symmetric and intact. And there is no intraorbital, postseptal inflammation or abnormality identified. But there is moderate to severe left periorbital, preseptal soft tissue swelling and stranding which tracks up into the left forehead, lateral face and left premalar space. Phlegmon like superficial area laterally on series 2, image 23, about 19 mm. No soft tissue gas. No organized or drainable fluid. Contralateral right periorbital soft tissues appear normal. Visible paranasal sinuses: Scattered paranasal sinus mucosal thickening which has not significantly changed from the MRI last year. Left maxillary sinus aeration has improved since that time. Visible tympanic cavities and mastoids are clear. Soft tissues: Negative visible pharynx, parapharyngeal spaces, masticator and parotid spaces. Osseous: No acute osseous abnormality identified. Limited intracranial: Visible major vascular structures at the skull base are enhancing and appear patent. Cavernous sinus enhancement is maintained. Negative  visible brain parenchyma. IMPRESSION: 1. Moderate to severe Left Periorbital soft tissue swelling compatible with Preseptal Cellulitis. No postseptal involvement. 1.9 cm phlegmon area lateral to the orbit. But no soft tissue gas or drainable fluid. 2. Unrelated appearing paranasal sinus inflammation, stable to improved from last year. Electronically Signed   By: Odessa Fleming M.D.   On: 06/13/2023 12:38    Procedures Procedures    Medications Ordered in ED Medications  cefTRIAXone (ROCEPHIN) 2 g in sodium chloride 0.9 % 100 mL IVPB (has no administration in time range)  melatonin tablet 3 mg (3 mg Oral Given 06/13/23 2225)  enoxaparin (LOVENOX) injection 40 mg (has no administration in time range)  acetaminophen (TYLENOL) tablet 650 mg (has no administration in time range)    Or  acetaminophen (TYLENOL) suppository 650 mg (has no administration in time range)  HYDROcodone-acetaminophen (NORCO/VICODIN) 5-325 MG per tablet 1-2 tablet (1 tablet Oral Given 06/13/23 2225)  morphine (PF) 2 MG/ML injection 1 mg (has no administration in time range)  senna-docusate (Senokot-S) tablet 1 tablet (has no administration in time  range)  ibuprofen (ADVIL) tablet 400 mg (has no administration in time range)  ondansetron (ZOFRAN) tablet 4 mg (has no administration in time range)    Or  ondansetron (ZOFRAN) injection 4 mg (has no administration in time range)  atorvastatin (LIPITOR) tablet 20 mg (has no administration in time range)  dapagliflozin propanediol (FARXIGA) tablet 10 mg (has no administration in time range)  spironolactone (ALDACTONE) tablet 25 mg (has no administration in time range)  metoprolol succinate (TOPROL-XL) 24 hr tablet 50 mg (has no administration in time range)  sacubitril-valsartan (ENTRESTO) 97-103 mg per tablet (1 tablet Oral Given 06/13/23 2214)  Vancomycin (VANCOCIN) 1,500 mg in sodium chloride 0.9 % 500 mL IVPB (has no administration in time range)  lactated ringers bolus 1,000 mL (0  mLs Intravenous Stopped 06/13/23 1200)  vancomycin (VANCOCIN) IVPB 1000 mg/200 mL premix (0 mg Intravenous Stopped 06/13/23 1210)  vancomycin (VANCOCIN) IVPB 1000 mg/200 mL premix (0 mg Intravenous Stopped 06/13/23 1320)  iohexol (OMNIPAQUE) 300 MG/ML solution 100 mL (75 mLs Intravenous Contrast Given 06/13/23 1139)  cefTRIAXone (ROCEPHIN) 1 g in sodium chloride 0.9 % 100 mL IVPB (0 g Intravenous Stopped 06/13/23 1922)  Vancomycin (VANCOCIN) 1,500 mg in sodium chloride 0.9 % 500 mL IVPB (1,500 mg Intravenous New Bag/Given 06/13/23 2221)    ED Course/ Medical Decision Making/ A&P Clinical Course as of 06/14/23 0739  Thu Jun 13, 2023  1516 CT Orbits W Contrast CT scan is concerning for preseptal cellulitis, phlegmon. I spoke with the [facility].  They are independent plastic surgery clinic.  I spoke with [place], advanced practitioner who saw the patient yesterday and performed I&D.  She stated large amount of thick, yellow/creamy discharge was drained yesterday.   Given this finding, patient will need admission.  Given that there is some phlegmon, he might need surgical intervention if this evolves and gets worse.  We will figure out the next best step in terms of admission for the patient. [AN]  [facility] coming to see and recommend [facility] or [facility]. [CC]    Clinical Course User Index [AN] Derwood Kaplan, MD [CC] Glyn Ade, MD                                 Medical Decision Making Amount and/or Complexity of Data Reviewed Labs: ordered. Radiology: ordered. Decision-making details documented in ED Course.  Risk Prescription drug management. Decision regarding hospitalization.   42 year old male comes in with chief complaint of facial swelling.  He is status post cyst resection and drainage yesterday by oculoplastics at an outpatient clinic.  Patient has past medical history of breast cancer, currently in remission and on tamoxifen.    I reviewed patient's records  including his medications and past visit with cardiologist.  Based on our evaluation, differential diagnosis considered includes post operative edema, facial cellulitis, infected cyst, preseptal cellulitis.  Although less likely, I did consider SVC syndrome in the differential as well given patient's history.  Patient arrives with tachycardia, tachypnea and fever of 100.7.  We will initiate sepsis workup.  We will give him broad-spectrum antibiotics and get CT max face with contrast.  Final Clinical Impression(s) / ED Diagnoses Final diagnoses:  Preseptal cellulitis    Rx / DC Orders ED Discharge Orders     None         Derwood Kaplan, MD 06/14/23 [phone]

## 2023-06-13 NOTE — ED Notes (Signed)
[facility] at bedside for transport. 

## 2023-06-13 NOTE — Consult Note (Signed)
CC:  Chief Complaint  Patient presents with   Facial Swelling    HPI: Michael Stuart is a 42 y.o. male w/ PMH below who presents for evaluation of left eye swelling.  Pt says he had a 'cyst' that began around 4 months ago, and after no improvement went to [facility] yesterday, where Dr. Westley Foots performed an I&D.  Upon waking up this AM he says that the eye was swollen shut, so he called [facility], who advised that he go to the ED to get IV antibiotics. Pt complains of eye pain at this time, improving.   ROS: Negative except as otherwise stated.  PMH: Past Medical History:  Diagnosis Date   CHF (congestive heart failure) (HCC)    Myocardial infarction (HCC)    Pneumonia     PSH: Past Surgical History:  Procedure Laterality Date   IR IMAGING GUIDED PORT INSERTION  09/28/2021   MASTECTOMY W/ SENTINEL NODE BIOPSY Left 02/21/2022   Procedure: LEFT SIMPLE MASTECTOMY WITH LEFT TARGETED LYMPH NODE BIOPSY;  Surgeon: Harriette Bouillon, MD;  Location: [facility];  Service: General;  Laterality: Left;  GEN w/ PEC BLOCK   PORT-A-CATH REMOVAL Right 02/21/2022   Procedure: PORT REMOVAL;  Surgeon: Harriette Bouillon, MD;  Location: [facility] OR;  Service: General;  Laterality: Right;   RIGHT/LEFT HEART CATH AND CORONARY ANGIOGRAPHY N/A 08/28/2021   Procedure: RIGHT/LEFT HEART CATH AND CORONARY ANGIOGRAPHY;  Surgeon: Elder Negus, MD;  Location: [facility];  Service: Cardiovascular;  Laterality: N/A;    Meds: No current facility-administered medications on file prior to encounter.   Current Outpatient Medications on File Prior to Encounter  Medication Sig Dispense Refill   doxycycline (VIBRA-TABS) 100 MG tablet Take 100 mg by mouth 2 (two) times daily.     neomycin-polymyxin b-dexamethasone (MAXITROL) 3.5-10000-0.1 OINT Place 1 Application into both eyes.     acetaminophen (TYLENOL) 500 MG tablet Take 1,500 mg by mouth daily as needed for moderate pain or headache.     atorvastatin (LIPITOR)  20 MG tablet TAKE 1 TABLET BY MOUTH EVERY DAY 90 tablet 3   dapagliflozin propanediol (FARXIGA) 10 MG TABS tablet Take 1 tablet by mouth daily. 90 tablet 2   eplerenone (INSPRA) 50 MG tablet Take 1 tablet by mouth daily. 90 tablet 3   metoprolol succinate (TOPROL-XL) 50 MG 24 hr tablet Take 1 tablet (50 mg total) by mouth daily. 90 tablet 3   sacubitril-valsartan (ENTRESTO) 97-103 MG Take 1 tablet by mouth 2 (two) times daily. 60 tablet 11   tamoxifen (NOLVADEX) 20 MG tablet TAKE 1 TABLET(20 MG) BY MOUTH DAILY 90 tablet 3   [DISCONTINUED] apixaban (ELIQUIS) 5 MG TABS tablet Take 1 tablet by mouth 2  times daily. 180 tablet 1    SH: Social History   Socioeconomic History   Marital status: Married    Spouse name: Not on file   Number of children: 2   Years of education: Not on file   Highest education level: Bachelor's degree (e.g., BA, AB, BS)  Occupational History   Occupation: crash Manufacturing systems engineer    Employer: [employer]  Tobacco Use   Smoking status: Former    Current packs/day: 0.00    Average packs/day: 0.3 packs/day for 25.0 years (6.3 ttl pk-yrs)    Types: Cigarettes    Start date: 59    Quit date: 2022    Years since quitting: 2.9   Smokeless tobacco: Current   Tobacco comments:    Smoked 2PPD  x 10 years. Last 13 years 3-4 cigarettes/day.   Vaping Use   Vaping status: Former   Substances: Nicotine  Substance and Sexual Activity   Alcohol use: Yes    Alcohol/week: 21.0 standard drinks of alcohol    Types: 21 Cans of beer per week    Comment: 2 tall boys per day, everyday x 20years   Drug use: Not Currently    Frequency: 1.0 times per week    Types: Marijuana    Comment: 3x per month   Sexual activity: Not Currently  Other Topics Concern   Not on file  Social History Narrative   Right handed    Social Determinants of Health   Financial Resource Strain: Low Risk  (08/29/2021)   Overall Financial Resource Strain (CARDIA)    Difficulty of Paying Living Expenses: Not  very hard  Food Insecurity: No Food Insecurity (08/29/2021)   Hunger Vital Sign    Worried About Running Out of Food in the Last Year: Never true    Ran Out of Food in the Last Year: Never true  Transportation Needs: No Transportation Needs (08/29/2021)   PRAPARE - Administrator, Civil Service (Medical): No    Lack of Transportation (Non-Medical): No  Physical Activity: Not on file  Stress: Not on file  Social Connections: Unknown (11/09/2021)   Received from [facility], [facility]   Social Network    Social Network: Not on file    FH: Family History  Problem Relation Age of Onset   Healthy Mother      Past Ocular History: none   Last Eye Exam: 2 yrs ago   Primary Eye Care: can't remember   Past Medical History:  Diagnosis Date   CHF (congestive heart failure) (HCC)    Myocardial infarction (HCC)    Pneumonia      Past Surgical History:  Procedure Laterality Date   IR IMAGING GUIDED PORT INSERTION  09/28/2021   MASTECTOMY W/ SENTINEL NODE BIOPSY Left 02/21/2022   Procedure: LEFT SIMPLE MASTECTOMY WITH LEFT TARGETED LYMPH NODE BIOPSY;  Surgeon: Harriette Bouillon, MD;  Location: [facility];  Service: General;  Laterality: Left;  GEN w/ PEC BLOCK   PORT-A-CATH REMOVAL Right 02/21/2022   Procedure: PORT REMOVAL;  Surgeon: Harriette Bouillon, MD;  Location: [facility];  Service: General;  Laterality: Right;   RIGHT/LEFT HEART CATH AND CORONARY ANGIOGRAPHY N/A 08/28/2021   Procedure: RIGHT/LEFT HEART CATH AND CORONARY ANGIOGRAPHY;  Surgeon: Elder Negus, MD;  Location: [facility];  Service: Cardiovascular;  Laterality: N/A;     Social History   Socioeconomic History   Marital status: Married    Spouse name: Not on file   Number of children: 2   Years of education: Not on file   Highest education level: Bachelor's degree (e.g., BA, AB, BS)  Occupational History   Occupation: crash Manufacturing systems engineer    Employer: [employer]  Tobacco Use   Smoking status:  Former    Current packs/day: 0.00    Average packs/day: 0.3 packs/day for 25.0 years (6.3 ttl pk-yrs)    Types: Cigarettes    Start date: 32    Quit date: 2022    Years since quitting: 2.9   Smokeless tobacco: Current   Tobacco comments:    Smoked 2PPD x 10 years. Last 13 years 3-4 cigarettes/day.   Vaping Use   Vaping status: Former   Substances: Nicotine  Substance and Sexual Activity   Alcohol use: Yes  Alcohol/week: 21.0 standard drinks of alcohol    Types: 21 Cans of beer per week    Comment: 2 tall boys per day, everyday x 20years   Drug use: Not Currently    Frequency: 1.0 times per week    Types: Marijuana    Comment: 3x per month   Sexual activity: Not Currently  Other Topics Concern   Not on file  Social History Narrative   Right handed    Social Determinants of Health   Financial Resource Strain: Low Risk  (08/29/2021)   Overall Financial Resource Strain (CARDIA)    Difficulty of Paying Living Expenses: Not very hard  Food Insecurity: No Food Insecurity (08/29/2021)   Hunger Vital Sign    Worried About Running Out of Food in the Last Year: Never true    Ran Out of Food in the Last Year: Never true  Transportation Needs: No Transportation Needs (08/29/2021)   PRAPARE - Administrator, Civil Service (Medical): No    Lack of Transportation (Non-Medical): No  Physical Activity: Not on file  Stress: Not on file  Social Connections: Unknown (11/09/2021)   Received from [facility], [facility]   Social Network    Social Network: Not on file  Intimate Partner Violence: Unknown (10/12/2021)   Received from [facility], [facility]   HITS    Physically Hurt: Not on file    Insult or Talk Down To: Not on file    Threaten Physical Harm: Not on file    Scream or Curse: Not on file     Allergies  Allergen Reactions   Bactrim [Sulfamethoxazole-Trimethoprim] Other (See Comments)    Blood Capillaries Rupture    Sulfacetamide Sodium Other (See  Comments)     No current facility-administered medications on file prior to encounter.   Current Outpatient Medications on File Prior to Encounter  Medication Sig Dispense Refill   doxycycline (VIBRA-TABS) 100 MG tablet Take 100 mg by mouth 2 (two) times daily.     neomycin-polymyxin b-dexamethasone (MAXITROL) 3.5-10000-0.1 OINT Place 1 Application into both eyes.     acetaminophen (TYLENOL) 500 MG tablet Take 1,500 mg by mouth daily as needed for moderate pain or headache.     atorvastatin (LIPITOR) 20 MG tablet TAKE 1 TABLET BY MOUTH EVERY DAY 90 tablet 3   dapagliflozin propanediol (FARXIGA) 10 MG TABS tablet Take 1 tablet by mouth daily. 90 tablet 2   eplerenone (INSPRA) 50 MG tablet Take 1 tablet by mouth daily. 90 tablet 3   metoprolol succinate (TOPROL-XL) 50 MG 24 hr tablet Take 1 tablet (50 mg total) by mouth daily. 90 tablet 3   sacubitril-valsartan (ENTRESTO) 97-103 MG Take 1 tablet by mouth 2 (two) times daily. 60 tablet 11   tamoxifen (NOLVADEX) 20 MG tablet TAKE 1 TABLET(20 MG) BY MOUTH DAILY 90 tablet 3   [DISCONTINUED] apixaban (ELIQUIS) 5 MG TABS tablet Take 1 tablet by mouth 2  times daily. 180 tablet 1     ROS    Exam:  General: Awake, Alert, Oriented *3  Vision (near): without correction   OD: J10  OS: J8  Confrontational Field:   Full to count fingers, both eyes  Extraocular Motility:  Full ductions and versions, both eyes  Pupils  OD: 4mm to 3mm reactive without afferent pupillary defect (APD)  OS: 4mm to 3mm reactive without afferent pupillary defect (APD)   IOP(tonopen)  OD: 30  OS: 42  Slit Lamp Exam:  Lids/Lashes  OD:  Normal Lids and lashes, No lesion or injury  OS: edema, erythema, induration, healing incision  Conjunctiva/Sclera  OD: White and quiet  OS: White and quiet  Cornea  OD: Clear without abrasion or defect  OS: Clear without abrasion or defect  Anterior Chamber  OD: Deep and quiet  OS: Deep and quiet  Iris  OD:  Normal iris architecture  OS: Normal Iris Architecture   Lens  OD: Clear, Without significant opacities  OS: Clear, Without significant opacities  Anterior Vitreous  OD: Clear, without cell  OS: Clear without cell   POSTERIOR POLE EXAM (Dilated with phenylephrine and tropicamide.  Dilation may last up to 24 hours)  View:   OD: 20/20 view without opacities  OS: 20/20 view without opacities  Vitreous:   OD: Clear, no cell  OS: Clear, no cell  Disc:   OD: flat, sharp margin, with appropriate color  OS: flat, sharp margin, with appropriate color  C:D Ratio:   OD: 0.3  OS: 0.3  Macula  OD: Flat, with appropriate light reflex  OS: Flat with appropriate light reflex  Vessels  OD: Normal vasculature  OS: Normal vasculature  Periphery  OD: Flat 360 degrees without tear, hole or detachment  OS: Flat 360 degrees without tear, hole or detachment  CT Orbits 06/13/23: IMPRESSION: 1. Moderate to severe Left Periorbital soft tissue swelling compatible with Preseptal Cellulitis. No postseptal involvement. 1.9 cm phlegmon area lateral to the orbit. But no soft tissue gas or drainable fluid. 2. Unrelated appearing paranasal sinus inflammation, stable to improved from last year.   Assessment and Plan:   Left preseptal cellulitis -Pt notes I&D performed yesterday by Dr. Westley Foots at [facility]; upon waking up this AM notes worsening of eyelid swelling. -per pt, he was instructed by [facility] to go to ED to receive antibiotics. -Exam with normal VA and no APD.  IOP elevated OU.  Asymmetric IOP elevation likely related to lid edema.  EOMs intact.  Posterior exam benign with optic nerve pink & well perfused -CT images personally reviewed and I agree no evidence of postseptal involvement. -This presentation is consistent with preseptal cellulitis.  With apparently large volume I&D performed yesterday of LUL some worsening of edema can be expected at this stage. -As no evidence of  optic nerve compromise or orbital compartment syndrome, and no imaging findings of orbital cellulitis, reasonable to monitor clinically with parenteral antibiotics.  - Vancomycin provides good gram positive coverage of skin flora; may consider additional gram-negative coverage. -After 24-48 hours of parenteral abx w/ clinical improvement ok to transition to PO tx and eventual discharge.  2. Ocular Hypertension, both eyes -Incidental finding of significant IOP elevation OU -no indication for inpatient intervention, but this does merit outpatient follow up with pt's primary eye care provider.  If questions or clinical worsening please call or page.  Coralee North, MD Ophthalmology [facility] [phone]  Total Time Spent: 40 minutes

## 2023-06-13 NOTE — Progress Notes (Signed)
Pharmacy Antibiotic Note  Michael Stuart is a 42 y.o. male admitted on 06/13/2023 with .  Pharmacy has been consulted for preseptal cellulitis dosing.  Plan: Vancomycin 2 grams given @ [facility] ED @ 11 am Vancomycin 1500 mg IV q12h for eAUC 475.4 using SCr 1.03 Vd 0.5 Ceftriaxone 2 gm IV q24 per MD    Temp (24hrs), Avg:99.5 F (37.5 C), Min:98.3 F (36.8 C), Max:100.7 F (38.2 C)  Recent Labs  Lab 06/13/23 1020  WBC 14.7*  CREATININE 1.03  LATICACIDVEN 1.7    CrCl cannot be calculated (Unknown ideal weight.).    Allergies  Allergen Reactions   Bactrim [Sulfamethoxazole-Trimethoprim] Other (See Comments)    Blood Capillaries Rupture    Sulfacetamide Sodium Other (See Comments)    Antimicrobials this admission: 12/5 vanc>> 12/5 CTX>> Dose adjustments this admission:  Microbiology results: 12/5 BCx2:   Thank you for allowing pharmacy to be a part of this patient's care.  Herby Abraham, Pharm.D Use secure chat for questions 06/13/2023 9:06 PM

## 2023-06-13 NOTE — ED Notes (Signed)
Pt is asking for pain medication prior to transport. Pain is currently 8/10. MD notified.

## 2023-06-13 NOTE — Sepsis Progress Note (Signed)
[facility] monitoring for the code sepsis protocol.  

## 2023-06-13 NOTE — Hospital Course (Signed)
Michael Stuart is a 42 y.o. 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

## 2023-06-13 NOTE — ED Notes (Signed)
Called Opth- Dr Jeannett Senior

## 2023-06-13 NOTE — ED Notes (Signed)
Report given to [facility] transport team 

## 2023-06-13 NOTE — ED Notes (Signed)
Report called to [facility] Azucena Cecil, Charity fundraiser.

## 2023-06-13 NOTE — ED Triage Notes (Signed)
He has had a small bump above L eye. Had it drained by an eye dr yesterday. Woke up today with significant eye swelling and pain today.

## 2023-06-14 DIAGNOSIS — L0390 Cellulitis, unspecified: Secondary | ICD-10-CM | POA: Diagnosis not present

## 2023-06-14 DIAGNOSIS — A419 Sepsis, unspecified organism: Secondary | ICD-10-CM | POA: Diagnosis not present

## 2023-06-14 LAB — BASIC METABOLIC PANEL
Anion gap: 8 (ref 5–15)
BUN: 10 mg/dL (ref 6–20)
CO2: 24 mmol/L (ref 22–32)
Calcium: 8.5 mg/dL — ABNORMAL LOW (ref 8.9–10.3)
Chloride: 104 mmol/L (ref 98–111)
Creatinine, Ser: 0.77 mg/dL (ref 0.61–1.24)
GFR, Estimated: >60 mL/min (ref >60)
Glucose, Bld: 131 mg/dL — ABNORMAL HIGH (ref 70–99)
Potassium: 3.9 mmol/L (ref 3.5–5.1)
Sodium: 136 mmol/L (ref 135–145)

## 2023-06-14 LAB — CBC
HCT: 43 % (ref 39.0–52.0)
Hemoglobin: 13.9 g/dL (ref 13.0–17.0)
MCH: 31.3 pg (ref 26.0–34.0)
MCHC: 32.3 g/dL (ref 30.0–36.0)
MCV: 96.8 fL (ref 80.0–100.0)
Platelets: 192 10*3/uL (ref 150–400)
RBC: 4.44 MIL/uL (ref 4.22–5.81)
RDW: 13.1 % (ref 11.5–15.5)
WBC: 8 10*3/uL (ref 4.0–10.5)
nRBC: 0 % (ref 0.0–0.2)

## 2023-06-14 LAB — HIV ANTIBODY (ROUTINE TESTING W REFLEX): HIV Screen 4th Generation wRfx: NONREACTIVE

## 2023-06-14 NOTE — Plan of Care (Signed)
   Problem: Education: Goal: Knowledge of General Education information will improve Description: Including pain rating scale, medication(s)/side effects and non-pharmacologic comfort measures Outcome: Progressing   Problem: Clinical Measurements: Goal: Ability to maintain clinical measurements within normal limits will improve Outcome: Progressing   Problem: Nutrition: Goal: Adequate nutrition will be maintained Outcome: Progressing   Problem: Pain Management: Goal: General experience of comfort will improve Outcome: Progressing   Problem: Safety: Goal: Ability to remain free from injury will improve Outcome: Progressing   Problem: Skin Integrity: Goal: Risk for impaired skin integrity will decrease Outcome: Progressing

## 2023-06-14 NOTE — Plan of Care (Signed)

## 2023-06-14 NOTE — Progress Notes (Signed)
   06/14/23 1503  TOC Brief Assessment  Insurance and Status Reviewed  Patient has primary care physician Yes  Home environment has been reviewed Single family home  Prior level of function: Independent  Prior/Current Home Services No current home services  Social Determinants of Health Reivew SDOH reviewed no interventions necessary  Readmission risk has been reviewed Yes  Transition of care needs no transition of care needs at this time

## 2023-06-14 NOTE — Progress Notes (Signed)
[facility] PROGRESS NOTE  Michael Stuart (DOB: 1981/06/05) ZOX:[id number] PCP: Pcp, No  Brief Narrative: Michael Stuart is a 42 y.o. male with a history of HFimpEF, CVA and LV thrombus (now resolved, off anticoagulation), breast CA s/p left mastectomy, chemo/radiation, who presented to the ED on 06/13/2023 with left eye redness and swelling the day after upper eyelid cyst removal. He was started on IV antibiotics with plan to continue 24-48 hours for preseptal cellulitis.    Subjective: Swelling is improved some from yesterday. No fevers.   Objective: BP (!) 139/97 (BP Location: Right Arm)   Pulse 99   Temp 98.8 F (37.1 C) (Oral)   Resp 20   Ht 6\' 3"  (1.905 m)   Wt (!) 144.5 kg   SpO2 97%   BMI 39.82 kg/m   Gen: No distress HEENT: Left eye with reactive pupil and grossly preserved visual acuity. Pulm: Clear, nonlabored  CV: RRR, no MRG or edema GI: Soft, NT, ND, +BS  Neuro: Alert and oriented. No new focal deficits.  Picture from this AM showing decreased intensity of erythema and edema.  Assessment & Plan: Sepsis due to left preseptal cellulitis: Presenting with leukocytosis, tachycardia, fever in setting of left preseptal cellulitis after undergoing outpatient I&D to left upper eyelid cyst on 06/12/2023. - Evaluated by ophthalmology Dr. Nada Libman, plan to continue broad IV antibiotics pending cultures and clinical improvement prior to potential discharge home with oral antibiotics.     Chronic systolic CHF: Appears euvolemic.  EF improved to 50-55% from previous of 20%. - Continue home Entresto, Toprol-XL, eplerenone, Farxiga   Hyperlipidemia: - Continue atorvastatin.   Left breast cancer: Follows with oncology Dr. Pamelia Hoit.  S/p left mastectomy, neoadjuvant chemotherapy and adjuvant radiation.  Currently managed on tamoxifen.  Further testing planned with bone scan and CT chest/abdomen/pelvis per his oncologist for evaluation of metastases.  Tyrone Nine, MD [facility] [email or web address] 06/14/2023, 2:54 PM

## 2023-06-15 DIAGNOSIS — L0390 Cellulitis, unspecified: Secondary | ICD-10-CM | POA: Diagnosis not present

## 2023-06-15 DIAGNOSIS — A419 Sepsis, unspecified organism: Secondary | ICD-10-CM | POA: Diagnosis not present

## 2023-06-15 MED ORDER — LINEZOLID 600 MG PO TABS: 600.0000 mg | ORAL_TABLET | Freq: Two times a day (BID) | ORAL | 0 refills | Status: AC

## 2023-06-15 MED ORDER — CEFDINIR 300 MG PO CAPS: 300.0000 mg | ORAL_CAPSULE | Freq: Two times a day (BID) | ORAL | 0 refills | Status: AC

## 2023-06-15 MED ORDER — ENOXAPARIN SODIUM 80 MG/0.8ML IJ SOSY: 70.0000 mg | PREFILLED_SYRINGE | INTRAMUSCULAR | Status: DC

## 2023-06-15 MED ORDER — VANCOMYCIN HCL 1.5 G IV SOLR
1500.0000 mg | Freq: Three times a day (TID) | INTRAVENOUS | Status: DC
  Filled 2023-06-15: qty 30

## 2023-06-15 NOTE — Plan of Care (Signed)

## 2023-06-15 NOTE — Discharge Summary (Signed)
Physician Discharge Summary   Patient: Michael Stuart MRN: [id number] DOB: Jan 08, 1981  Admit date:     06/13/2023  Discharge date: 06/15/23  Discharge Physician: Tyrone Nine   PCP: Pcp, No   Recommendations at discharge:  Follow up with Dr. Westley Foots in next 48-72 hours. Discharged on linezolid and omnicef for preseptal cellulitis showing significant improvement at time of discharge. Return precautions reinforced.   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 *  Hospital Course: Michael Stuart is a 42 y.o. 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  Assessment and Plan: No notes have been filed under this hospital service. Service: Hospitalist     {Tip this will not be part of the note when signed Body mass index is 39.82 kg/m. , ,  (Optional):26781}  {(NOTE) Pain control PDMP Statment (Optional):26782} Consultants: *** Procedures performed: ***  Disposition: {Plan; Disposition:26390} Diet recommendation:  {Diet_Plan:26776} DISCHARGE MEDICATION: Allergies as of 06/15/2023       Reactions   Bactrim [sulfamethoxazole-trimethoprim] Other (See Comments)   Blood Capillaries Rupture    Sulfacetamide Sodium Other (See Comments)     Med Rec must be completed prior to using this [facility]***       Follow-up Information     Nevin Bloodgood, MD .    Specialty: [facility] Ophthalmology Contact information: [address] [place] [address] [phone]         Nevin Bloodgood, MD .   Specialty: [facility] Ophthalmology Contact information: [address] [place] [address] [phone]                Discharge Exam: Ceasar Mons Weights   06/13/23 2024 06/15/23 0500  Weight: (!) 144.5 kg (!) 144.5 kg   ***  Condition at discharge: {DC Condition:26389}  The results of significant diagnostics from this hospitalization (including imaging, microbiology, ancillary and laboratory) are listed below for reference.   Imaging Studies: CT Orbits W Contrast  Result Date: 06/13/2023 CLINICAL DATA:  42 year old male with orbital cellulitis, with a small draining skin lesion above the left eye yesterday. EXAM: CT ORBITS WITH CONTRAST TECHNIQUE: Multidetector CT images was performed according to the standard protocol following intravenous contrast administration. RADIATION DOSE REDUCTION: This exam was performed according to the departmental dose-optimization program which includes automated exposure control, adjustment of the mA and/or kV according to patient size and/or use of iterative reconstruction technique. CONTRAST:  75mL OMNIPAQUE IOHEXOL 300 MG/ML  SOLN COMPARISON:  Brain MRI 09/01/2021. FINDINGS: Orbits: Intact orbital walls. Globes appear symmetric and intact. And there is no intraorbital, postseptal inflammation or abnormality identified. But there is moderate to severe left periorbital, preseptal soft tissue swelling and stranding which tracks up into the left forehead, lateral face and left premalar space. Phlegmon like superficial area laterally on series 2, image 23, about 19 mm. No soft tissue gas. No organized or drainable fluid. Contralateral right periorbital soft tissues appear normal. Visible paranasal sinuses:  Scattered paranasal sinus mucosal thickening which has not significantly  changed from the MRI last year. Left maxillary sinus aeration has improved since that time. Visible tympanic cavities and mastoids are clear. Soft tissues: Negative visible pharynx, parapharyngeal spaces, masticator and parotid spaces. Osseous: No acute osseous abnormality identified. Limited intracranial: Visible major vascular structures at the skull base are enhancing and appear patent. Cavernous sinus enhancement is maintained. Negative visible brain parenchyma. IMPRESSION: 1. Moderate to severe Left Periorbital soft tissue swelling compatible with Preseptal Cellulitis. No postseptal involvement. 1.9 cm phlegmon area lateral to the orbit. But no soft tissue gas or drainable fluid. 2. Unrelated appearing paranasal sinus inflammation, stable to improved from last year. Electronically Signed   By: Odessa Fleming M.D.   On: 06/13/2023 12:38    Microbiology: Results for orders placed or performed during the hospital encounter of 06/13/23  Blood Culture (routine x 2)     Status: None (Preliminary result)   Collection Time: 06/13/23 10:40 AM   Specimen: BLOOD  Result Value Ref Range Status   Specimen Description   Final    BLOOD RIGHT ANTECUBITAL Performed at [facility], [address], [place], [place] [address]    Special Requests   Final    BOTTLES DRAWN AEROBIC AND ANAEROBIC Blood Culture adequate volume Performed at [facility], [address] [place], [place] [address]    Culture   Final    NO GROWTH 2 DAYS Performed at [facility], [address], [place], [place] [address]    Report Status PENDING  Incomplete  Culture, blood (Routine X 2) w Reflex to ID Panel     Status: None (Preliminary result)   Collection Time: 06/13/23  8:59 PM   Specimen: BLOOD  Result Value Ref Range Status   Specimen Description   Final    BLOOD RIGHT ANTECUBITAL Performed at [facility], [address] [place], [place] [address]    Special  Requests   Final    BOTTLES DRAWN AEROBIC ONLY Blood Culture results may not be optimal due to an inadequate volume of blood received in culture bottles Performed at [facility], [address], [place], [place] [address]    Culture   Final    NO GROWTH < 12 HOURS Performed at [facility], [address] [place], [place] [address]    Report Status PENDING  Incomplete    Labs: CBC: Recent Labs  Lab 06/13/23 1020 06/14/23 0602  WBC 14.7* 8.0  NEUTROABS 11.6*  --   HGB 15.3 13.9  HCT 45.8 43.0  MCV 93.9 96.8  PLT 246 192   Basic Metabolic Panel: Recent Labs  Lab 06/13/23 1020 06/14/23 0602  NA 138 136  K 3.9 3.9  CL 103 104  CO2 24 24  GLUCOSE 117* 131*  BUN 10 10  CREATININE 1.03 0.77  CALCIUM 9.4 8.5*   Liver Function Tests: Recent Labs  Lab 06/13/23 1020  AST 20  ALT 31  ALKPHOS 64  BILITOT 0.5  PROT 7.3  ALBUMIN 4.3   CBG: No results for input(s): "GLUCAP" in the last 168 hours.  Discharge time spent: {LESS THAN/GREATER VHQI:69629} 30 minutes.  Signed: Tyrone Nine, MD [facility] 06/15/2023

## 2023-06-15 NOTE — Progress Notes (Signed)
Pharmacy Antibiotic Note  Michael Stuart is a 42 y.o. male admitted on 06/13/2023 with .  Pharmacy has been consulted for preseptal cellulitis dosing.  Today, serum creatinine has improved to 0.77 and eAUC re-calculated.  Plan: Change vancomycin to 1500 mg IV q8h (Goal AUC 400-550, eAUC 513.5, SCr used: rounded up to 0.8) Ceftriaxone 2 gm IV q24 per MD Height: 6\' 3"  (190.5 cm) Weight: (!) 144.5 kg (318 lb 9 oz) IBW/kg (Calculated) : 84.5  Temp (24hrs), Avg:98.7 F (37.1 C), Min:98.2 F (36.8 C), Max:99.5 F (37.5 C)  Recent Labs  Lab 06/13/23 1020 06/14/23 0602  WBC 14.7* 8.0  CREATININE 1.03 0.77  LATICACIDVEN 1.7  --     Estimated Creatinine Clearance: 180.8 mL/min (by C-G formula based on SCr of 0.77 mg/dL).    Allergies  Allergen Reactions   Bactrim [Sulfamethoxazole-Trimethoprim] Other (See Comments)    Blood Capillaries Rupture    Sulfacetamide Sodium Other (See Comments)    Antimicrobials this admission: 12/5 vanc>> 12/5 CTX>>  Microbiology results: 12/5 BCx2: NGTD   Thank you for allowing pharmacy to be a part of this patient's care.  Selinda Eon, PharmD, BCPS Clinical Pharmacist [place] 06/15/2023 1:41 PM

## 2023-06-15 NOTE — Progress Notes (Signed)
Dedra Skeens to be D/C'd home per MD order. Discussed with the patient and all questions fully answered.  Skin clean, dry and intact without evidence of skin break down, no evidence of skin tears noted. L eye edemetous with scant drainage. IV catheter discontinued intact. Site without signs and symptoms of complications. Dressing and pressure applied.  An After Visit Summary was printed and given to the patient.  Patient escorted via WC, and D/C home via private auto.  Jon Gills  06/15/2023

## 2023-06-17 ENCOUNTER — Encounter: Payer: Self-pay | Admitting: Hematology and Oncology

## 2023-06-18 LAB — CULTURE, BLOOD (ROUTINE X 2)
Culture: NO GROWTH
Culture: NO GROWTH
Special Requests: ADEQUATE

## 2023-06-26 ENCOUNTER — Encounter (HOSPITAL_COMMUNITY)
Admission: RE | Admit: 2023-06-26 | Discharge: 2023-06-26 | Disposition: A | Discharge status: Home / Self Care | Payer: BC Managed Care – PPO | Source: Ambulatory Visit | Attending: Hematology and Oncology | Admitting: Hematology and Oncology

## 2023-06-26 ENCOUNTER — Ambulatory Visit (HOSPITAL_COMMUNITY)
Admission: RE | Admit: 2023-06-26 | Discharge: 2023-06-26 | Disposition: A | Discharge status: Home / Self Care | Payer: BC Managed Care – PPO | Source: Ambulatory Visit | Attending: Hematology and Oncology | Admitting: Hematology and Oncology

## 2023-06-26 DIAGNOSIS — H0279 Other degenerative disorders of eyelid and periocular area: Secondary | ICD-10-CM | POA: Diagnosis not present

## 2023-06-26 DIAGNOSIS — R590 Localized enlarged lymph nodes: Secondary | ICD-10-CM | POA: Diagnosis not present

## 2023-06-26 DIAGNOSIS — C50422 Malignant neoplasm of upper-outer quadrant of left male breast: Secondary | ICD-10-CM | POA: Diagnosis not present

## 2023-06-26 DIAGNOSIS — C50919 Malignant neoplasm of unspecified site of unspecified female breast: Secondary | ICD-10-CM | POA: Diagnosis not present

## 2023-06-26 DIAGNOSIS — R918 Other nonspecific abnormal finding of lung field: Secondary | ICD-10-CM | POA: Diagnosis not present

## 2023-06-26 DIAGNOSIS — Z170 Estrogen receptor positive status [ER+]: Secondary | ICD-10-CM | POA: Insufficient documentation

## 2023-06-26 MED ORDER — IOHEXOL 300 MG/ML  SOLN
100.0000 mL | Freq: Once | INTRAMUSCULAR | Status: AC | PRN
  Administered 2023-06-26: 100 mL via INTRAVENOUS

## 2023-06-26 MED ORDER — TECHNETIUM TC 99M MEDRONATE IV KIT
20.0000 | PACK | Freq: Once | INTRAVENOUS | Status: AC | PRN
Start: 2023-06-26 — End: 2023-06-26
  Administered 2023-06-26: 19.5 via INTRAVENOUS

## 2023-06-26 MED ORDER — SODIUM CHLORIDE (PF) 0.9 % IJ SOLN
INTRAMUSCULAR | Status: AC
  Filled 2023-06-26: qty 50

## 2023-07-04 ENCOUNTER — Inpatient Hospital Stay: Payer: BC Managed Care – PPO | Admitting: Hematology and Oncology

## 2023-07-04 DIAGNOSIS — Z170 Estrogen receptor positive status [ER+]: Secondary | ICD-10-CM

## 2023-07-04 DIAGNOSIS — C50422 Malignant neoplasm of upper-outer quadrant of left male breast: Secondary | ICD-10-CM

## 2023-07-04 NOTE — Assessment & Plan Note (Signed)
09/15/2021:Palpable left breast mass x8 months. CT 08/27/2021 in ED showed 3.8 cm left breast mass with left axillary lymph nodes.Mamm and [place]: 4 cm mass with 5 axillary lymph nodes: Biopsy: Grade 2 IDC, lymph node: Positive, ER 100%, PR 50%, Ki-67 25%, HER2 negative ratio 1.25, copy #3.45 (Recent history of coronary artery disease EF 22% and stroke)   Treatment plan: 1. Neoadjuvant chemotherapy with Taxotere and Cytoxan every 3 weeks x6 cycles completed 01/16/2022 2. left mastectomy: Residual grade 2 IDC 3.5 cm, margins negative, 2/2 lymph nodes positive, angiolymphatic invasion present, ER 100%, PR 50%, HER2 negative, Ki-67 25% 3. Followed by adjuvant radiation therapy 04/03/22-05/16/22 4.  Followed by adjuvant antiestrogen therapy +/- CDK 4 and 6 inhibitor CT CAP and bone scan: No distant metastatic disease ---------------------------------------------------------------------------------------------------------------------------------- Tamoxifen toxicities: Hot Flashes Signatera test positive (0.3)  06/26/2023: Bone scan: Negative, CT CAP: Enlarged left supraclavicular lymph node 2.1 cm, (used to be 0.8 cm) new tiny scattered nonspecific lung nodules (4 mm and 3 mm)  Recommendation: Biopsy of the supraclavicular lymph node Additional treatment based upon biopsy results.

## 2023-07-04 NOTE — Progress Notes (Signed)
HEMATOLOGY-ONCOLOGY TELEPHONE VISIT PROGRESS NOTE  I connected with our patient on 07/04/23 at  8:15 AM EST by telephone and verified that I am speaking with the correct person using two identifiers.  I discussed the limitations, risks, security and privacy concerns of performing an evaluation and management service by telephone and the availability of in person appointments.  I also discussed with the patient that there may be a patient responsible charge related to this service. The patient expressed understanding and agreed to proceed.   History of Present Illness: Follow-up to discuss results of CT scans History of Present Illness   The patient, with a history of cancer, presents for telephone follow-up after a recent hospitalization for an infection and cellulitis. He reports that the infection and cellulitis have resolved. However, a recent CT scan revealed an enlarged lymph node above the left collarbone, which was not visible on the last scan but was present and smaller on previous scans. The patient confirms that he can feel the lymph node. The CT scan also revealed tiny nodules in the lungs, which were not mentioned in previous scans.        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 CT 08/27/2021 in ED showed 3.8 centimeter left breast mass with left axillary lymph nodes.Mamm and [place]: 4 cm mass with 5 axillary lymph nodes: Biopsy: Grade 2 IDC, lymph node: Positive, ER 100%, PR 50%, Ki-67 25%, HER2 negative ratio 1.25, copy #3.45   09/25/2021 Cancer Staging   Staging form: Breast, AJCC 8th Edition - Clinical: Stage IIA (cT2, cN1, cM0, G2, ER+, PR+, HER2-) - Signed by Serena Croissant, MD on 09/25/2021 Histologic grading system: 3 grade system   10/03/2021 - 01/18/2022 Chemotherapy   Patient is on Treatment Plan : BREAST TC q21d     01/22/2022  Genetic Testing   Negative genetic testing on the CancerNext-Expanded+RNAinsight.  The report date is January 22, 2022.  The CancerNext-Expanded gene panel offered by [facility] and includes sequencing and rearrangement analysis for the following 77 genes: AIP, ALK, APC*, ATM*, AXIN2, BAP1, BARD1, BLM, BMPR1A, BRCA1*, BRCA2*, BRIP1*, CDC73, CDH1*, CDK4, CDKN1B, CDKN2A, CHEK2*, CTNNA1, DICER1, FANCC, FH, FLCN, GALNT12, KIF1B, LZTR1, MAX, MEN1, MET, MLH1*, MSH2*, MSH3, MSH6*, MUTYH*, NBN, NF1*, NF2, NTHL1, PALB2*, PHOX2B, PMS2*, POT1, PRKAR1A, PTCH1, PTEN*, RAD51C*, RAD51D*, RB1, RECQL, RET, SDHA, SDHAF2, SDHB, SDHC, SDHD, SMAD4, SMARCA4, SMARCB1, SMARCE1, STK11, SUFU, TMEM127, TP53*, TSC1, TSC2, VHL and XRCC2 (sequencing and deletion/duplication); EGFR, EGLN1, HOXB13, KIT, MITF, PDGFRA, POLD1, and POLE (sequencing only); EPCAM and GREM1 (deletion/duplication only). DNA and RNA analyses performed for * genes.      REVIEW OF SYSTEMS:   Constitutional: Denies fevers, chills or abnormal weight loss All other systems were reviewed with the patient and are negative. 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 CT 08/27/2021 in ED showed 3.8 cm left breast mass with left axillary lymph nodes.Mamm and [place]: 4 cm mass with 5 axillary lymph nodes: Biopsy: Grade 2 IDC, lymph node: Positive, ER 100%, PR 50%, Ki-67 25%, HER2 negative ratio 1.25, copy #3.45 (Recent history of coronary artery disease EF 22% and stroke)   Treatment plan: 1. Neoadjuvant chemotherapy with Taxotere and Cytoxan every 3 weeks x6 cycles completed 01/16/2022 2. left mastectomy: Residual grade 2  IDC 3.5 cm, margins negative, 2/2 lymph nodes positive, angiolymphatic invasion present, ER 100%, PR 50%, HER2 negative, Ki-67 25% 3. Followed by adjuvant radiation therapy 04/03/22-05/16/22 4.   Followed by adjuvant antiestrogen therapy +/- CDK 4 and 6 inhibitor CT CAP and bone scan: No distant metastatic disease ---------------------------------------------------------------------------------------------------------------------------------- Tamoxifen toxicities: Hot Flashes Signatera test positive (0.3)  06/26/2023: Bone scan: Negative, CT CAP: Enlarged left supraclavicular lymph node 2.1 cm, (used to be 0.8 cm) new tiny scattered nonspecific lung nodules (4 mm and 3 mm)  Recommendation: Biopsy of the supraclavicular lymph node Additional treatment based upon biopsy results.   --------------------------------- Assessment and Plan    Enlarged Lymph Node Noted on recent CT scan, increased in size from 0.8cm in 2023 to 2.1cm currently. Differential includes infection vs malignancy. Patient can palpate the node. -Plan for biopsy of the lymph node. -If pathology confirms malignancy, consider adding Verzenio to current treatment regimen.  Pulmonary Nodules Two small nodules (3mm and 4mm) noted in the right and left lung apices on recent CT scan. Too small to biopsy, etiology uncertain. -Monitor with serial imaging.  Cellulitis Recent hospitalization for cellulitis, now resolved. -No further action required at this time.  Breast Cancer Patient currently on Tamoxifen. Treatment plan may be adjusted pending results of lymph node biopsy. -Continue Tamoxifen until biopsy results are available.          I discussed the assessment and treatment plan with the patient. The patient was provided an opportunity to ask questions and all were answered. The patient agreed with the plan and demonstrated an understanding of the instructions. The patient was advised to call back or seek an in-person evaluation if the symptoms worsen or if the condition fails to improve as anticipated.   I provided 12 minutes of non-face-to-face time during this encounter.  This includes time for charting and  coordination of care   Tamsen Meek, MD

## 2023-07-04 NOTE — Progress Notes (Signed)
Michael Lack, MD  Michael Stuart PROCEDURE / BIOPSY REVIEW Date: 07/04/23  Requested Biopsy site: Left supraclavicular lymph node Reason for request: Biopsy, history of breast cancer Imaging review: Best seen on CT. Actually a posterior lower cervical node.  Decision: Approved Imaging modality to perform: Ultrasound Schedule with: Moderate Sedation Schedule for: Any VIR  Additional comments:   Please contact me with questions, concerns, or if issue pertaining to this request arise.  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 Please let the radiologist know that this is easily palpable lymph node.     History: NM whole body bone scan , ct chest abd pelvis w/ , ct orbits , mammogram  Provider: Serena Croissant, MD   Provider contact : [phone]

## 2023-07-05 ENCOUNTER — Telehealth: Payer: Self-pay | Admitting: Hematology and Oncology

## 2023-07-05 NOTE — Telephone Encounter (Signed)
Patient is aware of scheduled appointment times/dates

## 2023-07-14 NOTE — H&P (Signed)
 Chief Complaint: Enlarged left supraclavicular/ posterior cervical lymph node. Request is for biopsy for further evaluation.   Referring Physician(s): Hlizwj,Cpwjb  Supervising Physician: Vanice Revel  Patient Status: [facility] - Out-pt  History of Present Illness: Michael Stuart is a 43 y.o. male  Outpatient. Known to IR. History of MI, CHF, Breast cancer. Found to have a palpable left supraclavicular lymph node. CT CAP from 12.18.24 reads Enlarged left supraclavicular lymph node measures 2.0 x 2.1 cm, this was outside of the field of view on most recent prior imaging and likely corresponds with an 8 mm short axis lymph nodes seen on CT September 29, 2021. Team is requesting a left posterior lower cervical lymph node. Case approved by IR Attending Dr. Marcey Moan with moderate sedation   Currently without any significant complaints. Patient alert and laying in bed,calm. Denies any fevers, headache, chest pain, SOB, cough, abdominal pain, nausea, vomiting or bleeding.   No recent labs. All medications are within acceptable parameters. No pertinent allergies. Patient has been NPO since midnight.  Return precautions and treatment recommendations and follow-up discussed with the patient  who is agreeable with the plan.   Past Medical History:  Diagnosis Date   Breast cancer (HCC)    CHF (congestive heart failure) (HCC)    Myocardial infarction (HCC)    Pneumonia     Past Surgical History:  Procedure Laterality Date   IR IMAGING GUIDED PORT INSERTION  09/28/2021   MASTECTOMY W/ SENTINEL NODE BIOPSY Left 02/21/2022   Procedure: LEFT SIMPLE MASTECTOMY WITH LEFT TARGETED LYMPH NODE BIOPSY;  Surgeon: Vanderbilt Ned, MD;  Location: [facility] OR;  Service: General;  Laterality: Left;  GEN w/ PEC BLOCK   PORT-A-CATH REMOVAL Right 02/21/2022   Procedure: PORT REMOVAL;  Surgeon: Vanderbilt Ned, MD;  Location: [facility] OR;  Service: General;  Laterality: Right;   RIGHT/LEFT HEART CATH AND CORONARY  ANGIOGRAPHY N/A 08/28/2021   Procedure: RIGHT/LEFT HEART CATH AND CORONARY ANGIOGRAPHY;  Surgeon: Elmira Newman PARAS, MD;  Location: [facility] INVASIVE CV LAB;  Service: Cardiovascular;  Laterality: N/A;    Allergies: Bactrim [sulfamethoxazole-trimethoprim] and Sulfacetamide sodium  Medications: Prior to Admission medications   Medication Sig Start Date End Date Taking? Authorizing Provider  acetaminophen  (TYLENOL ) 500 MG tablet Take 1,500 mg by mouth daily as needed for moderate pain or headache.    [provider]  atorvastatin  (LIPITOR) 20 MG tablet TAKE 1 TABLET BY MOUTH EVERY DAY 09/03/22   Patwardhan, Manish J, MD  dapagliflozin  propanediol (FARXIGA ) 10 MG TABS tablet Take 1 tablet by mouth daily. 05/11/22   Patwardhan, Newman PARAS, MD  eplerenone  (INSPRA ) 50 MG tablet Take 1 tablet by mouth daily. 05/11/22   Patwardhan, Newman PARAS, MD  metoprolol  succinate (TOPROL -XL) 50 MG 24 hr tablet Take 1 tablet (50 mg total) by mouth daily. 05/11/22   Patwardhan, Newman PARAS, MD  neomycin-polymyxin b-dexamethasone  (MAXITROL) 3.5-10000-0.1 OINT Place 1 Application into both eyes. 06/12/23   [provider]  sacubitril -valsartan  (ENTRESTO ) 97-103 MG Take 1 tablet by mouth 2 (two) times daily. 06/11/22   Rolan Ezra RAMAN, MD  tamoxifen  (NOLVADEX ) 20 MG tablet TAKE 1 TABLET(20 MG) BY MOUTH DAILY 06/11/23   Odean Potts, MD  apixaban  (ELIQUIS ) 5 MG TABS tablet Take 1 tablet by mouth 2  times daily. 10/02/21 12/20/21  Elmira Newman PARAS, MD     Family History  Problem Relation Age of Onset   Healthy Mother     Social History   Socioeconomic History   Marital  status: Married    Spouse name: Not on file   Number of children: 2   Years of education: Not on file   Highest education level: Bachelor's degree (e.g., BA, AB, BS)  Occupational History   Occupation: crash manufacturing systems engineer    Employer: [employer]  Tobacco Use   Smoking status: Former    Current packs/day: 0.00    Average packs/day: 0.3  packs/day for 25.0 years (6.3 ttl pk-yrs)    Types: Cigarettes    Start date: 50    Quit date: 2022    Years since quitting: 3.0   Smokeless tobacco: Former   Tobacco comments:    Smoked 2PPD x 10 years. Last 13 years 3-4 cigarettes/day.   Vaping Use   Vaping status: Former   Substances: Nicotine  Substance and Sexual Activity   Alcohol  use: Yes    Alcohol /week: 21.0 standard drinks of alcohol     Types: 21 Cans of beer per week    Comment: 2 tall boys per day, everyday x 20years   Drug use: Not Currently    Frequency: 1.0 times per week    Types: Marijuana    Comment: 3x per month   Sexual activity: Not Currently  Other Topics Concern   Not on file  Social History Narrative   Right handed    Social Drivers of Health   Financial Resource Strain: Low Risk  (08/29/2021)   Overall Financial Resource Strain (CARDIA)    Difficulty of Paying Living Expenses: Not very hard  Food Insecurity: No Food Insecurity (06/13/2023)   Hunger Vital Sign    Worried About Running Out of Food in the Last Year: Never true    Ran Out of Food in the Last Year: Never true  Transportation Needs: No Transportation Needs (06/13/2023)   PRAPARE - Administrator, Civil Service (Medical): No    Lack of Transportation (Non-Medical): No  Physical Activity: Not on file  Stress: Not on file  Social Connections: Unknown (11/09/2021)   [facility]   Social Network    Social Network: Not on file    Review of Systems: A 12 point ROS discussed and pertinent positives are indicated in the HPI above.  All other systems are negative.  Review of Systems  Constitutional:  Negative for fever.  HENT:  Negative for congestion.   Respiratory:  Negative for cough and shortness of breath.   Cardiovascular:  Negative for chest pain.  Gastrointestinal:  Negative for abdominal pain.  Neurological:  Negative for headaches.  Psychiatric/Behavioral:  Negative for behavioral  problems and confusion.     Vital Signs: BP (!) 184/119   Pulse (!) 110   Resp 20   Ht 6' 3 (1.905 m)   Wt (!) 315 lb 4.8 oz (143 kg)   SpO2 95%   BMI 39.41 kg/m     Physical Exam Vitals and nursing note reviewed.  Constitutional:      Appearance: He is well-developed.  HENT:     Head: Normocephalic.     Mouth/Throat:     Mouth: Mucous membranes are dry.  Cardiovascular:     Rate and Rhythm: Regular rhythm. Tachycardia present.  Pulmonary:     Effort: Pulmonary effort is normal.  Musculoskeletal:        General: Normal range of motion.     Cervical back: Normal range of motion.  Skin:    General: Skin is warm and dry.  Neurological:  General: No focal deficit present.     Mental Status: He is alert and oriented to person, place, and time. Mental status is at baseline.  Psychiatric:        Mood and Affect: Mood normal.        Behavior: Behavior normal.        Thought Content: Thought content normal.        Judgment: Judgment normal.     Imaging: NM Bone Scan Whole Body Result Date: 06/26/2023 CLINICAL DATA:  History of breast cancer EXAM: NUCLEAR MEDICINE WHOLE BODY BONE SCAN TECHNIQUE: Whole body anterior and posterior images were obtained approximately 3 hours after intravenous injection of radiopharmaceutical. RADIOPHARMACEUTICALS:  19.5 mCi Technetium-75m MDP IV COMPARISON:  Multiple priors including CT June 26, 2023 and nuclear medicine bone scan September 29, 2021 FINDINGS: No scintigraphic evidence of abnormal radiotracer uptake to suggest osteoblastic osseous metastatic disease. Focus of uptake in the posterior right seventh rib compatible with known rib fracture. Similar focus of uptake in the lateral cervical spine favored degenerative. Radiotracer uptake in the left-greater-than-right feet similar prior favored degenerative. Multifocal radiotracer uptake involving the shoulders most consistent with degenerative arthropathy. Abnormal uptake in the anterior  pelvic soft tissues is favored urine contamination. Otherwise physiologic distribution of radiotracer activity. IMPRESSION: No convincing scintigraphic evidence of osteoblastic osseous metastatic disease. Electronically Signed   By: Reyes Holder M.D.   On: 06/26/2023 16:55   CT CHEST ABDOMEN PELVIS W CONTRAST Result Date: 06/26/2023 CLINICAL DATA:  History of breast cancer, follow-up. * Tracking Code: BO * EXAM: CT CHEST, ABDOMEN, AND PELVIS WITH CONTRAST TECHNIQUE: Multidetector CT imaging of the chest, abdomen and pelvis was performed following the standard protocol during bolus administration of intravenous contrast. RADIATION DOSE REDUCTION: This exam was performed according to the departmental dose-optimization program which includes automated exposure control, adjustment of the mA and/or kV according to patient size and/or use of iterative reconstruction technique. CONTRAST:  OMNIPAQUE  IOHEXOL  300 MG/ML  SOLN COMPARISON:  Multiple priors including CT Nov 13, 2022 FINDINGS: CT CHEST FINDINGS Cardiovascular: Normal caliber thoracic aorta. Normal size heart. No significant pericardial effusion/thickening. Mediastinum/Nodes: No suspicious thyroid nodule. Enlarged left supraclavicular lymph node measures 2.0 x 2.1 cm on image 4/2, this was outside of the field of view on most recent prior imaging and likely corresponds with an 8 mm short axis lymph nodes seen on CT September 29, 2021. No pathologically enlarged mediastinal, hilar or axillary lymph nodes. Left axillary surgical clips. Lungs/Pleura: A few new scattered tiny pulmonary nodules. For reference: -4 mm pulmonary nodule in the left lung apex on image 37/6. -3 mm right upper lobe pulmonary nodule on image 50/6. Similar postradiation change in the anterior left upper lobe. Musculoskeletal: Soft tissue thickening in the left breast for instance on image 32/2 is favored postsurgical without focal enhancing nodularity to suggest local recurrence.  Unchanged sclerotic focus in the right humeral head on image 6/2 favored to reflect a bone island. Left posterior seventh rib and left lateral ninth rib fractures again seen. No aggressive lytic or blastic lesion of bone. Multilevel degenerative change of the spine. CT ABDOMEN PELVIS FINDINGS Hepatobiliary: Geographic hepatic steatosis. No suspicious hepatic lesion. Gallbladder is unremarkable. Pancreas: No pancreatic ductal dilation or evidence of acute inflammation. Spleen: No splenomegaly Adrenals/Urinary Tract: Bilateral adrenal glands appear normal. No hydronephrosis. Kidneys demonstrate symmetric enhancement. Urinary bladder is unremarkable for degree of distension. Stomach/Bowel: Stomach is unremarkable for degree of distension. No pathologic dilation of small or large bowel.  No evidence of acute bowel inflammation. Vascular/Lymphatic: Normal caliber abdominal aorta. Smooth IVC contours. The portal, splenic and superior mesenteric veins are patent. Prominent retroperitoneal lymph nodes not pathologically enlarged by size criteria are similar prior examination. Reproductive: Prostate is unremarkable. Other: No significant abdominopelvic free fluid. Musculoskeletal: No aggressive lytic or blastic lesion of bone. Multilevel degenerative change of the spine. IMPRESSION: 1. Enlarged left supraclavicular lymph node measures 2.0 x 2.1 cm, this was outside of the field of view on most recent prior imaging and likely corresponds with an 8 mm short axis lymph nodes seen on CT September 29, 2021. Findings are concerning for nodal metastatic disease. 2. New tiny scattered nonspecific pulmonary nodules, suggest attention on short-term interval follow-up chest CT. 3. Soft tissue thickening in the left breast is favored postsurgical without focal enhancing nodularity to suggest local recurrence. 4. No evidence of metastatic disease within the abdomen or pelvis. 5. Geographic hepatic steatosis. These results will be called to  the ordering clinician or representative by the Radiologist Assistant, and communication documented in the PACS or [employer]. Electronically Signed   By: Reyes Holder M.D.   On: 06/26/2023 16:51    Labs:  CBC: Recent Labs    06/13/23 1020 06/14/23 0602  WBC 14.7* 8.0  HGB 15.3 13.9  HCT 45.8 43.0  PLT 246 192    COAGS: Recent Labs    06/13/23 1040  INR 1.1    BMP: Recent Labs    06/13/23 1020 06/14/23 0602  NA 138 136  K 3.9 3.9  CL 103 104  CO2 24 24  GLUCOSE 117* 131*  BUN 10 10  CALCIUM  9.4 8.5*  CREATININE 1.03 0.77  GFRNONAA >60 >60    LIVER FUNCTION TESTS: Recent Labs    06/13/23 1020  BILITOT 0.5  AST 20  ALT 31  ALKPHOS 64  PROT 7.3  ALBUMIN 4.3     Assessment and Plan:  43 y.o. male. Outpatient. History of MI, CHF, Breast cancer. Found to have a palpable left supraclavicular lymph node. CT CAP from 12.18.24 reads Enlarged left supraclavicular lymph node measures 2.0 x 2.1 cm, this was outside of the field of view on most recent prior imaging and likely corresponds with an 8 mm short axis lymph nodes seen on CT September 29, 2021. Team is requesting a left posterior lower cervical lymph node. Case approved by IR Attending Dr. Marcey Moan with moderate sedation  PLAN IR Image Guided Percutaneous  left posterior lower cervical lymph node biopsy  Risks and benefits of  left posterior lower cervical lymph node. was discussed with the patient and/or patient's family including, but not limited to bleeding, infection, damage to adjacent structures or low yield requiring additional tests.  All of the questions were answered and there is agreement to proceed.  Consent signed and in chart.   Thank you for this interesting consult.  I greatly enjoyed meeting Michael Stuart and look forward to participating in their care.  A copy of this report was sent to the requesting provider on this date.  Electronically Signed: Delon JAYSON Beagle,  NP 07/16/2023, 8:02 AM   I spent a total of  30 Minutes   in face to face in clinical consultation, greater than 50% of which was counseling/coordinating care for left supraclavicular/ posterior cervical lymph node biopsy

## 2023-07-15 ENCOUNTER — Other Ambulatory Visit (HOSPITAL_COMMUNITY): Payer: Self-pay | Admitting: Student

## 2023-07-15 NOTE — Progress Notes (Signed)
 Patient for US  guided Core Palpable LT Supraclavicular LN Biopsy on Tues 1/7/025, I called and LVM for the patient on the phone and gave pre-procedure instructions. VM made the pt aware to be here at 7:30a, NPO after MN prior to procedure as well as driver post procedure/recovery/discharge. Called 07/15/2023

## 2023-07-16 ENCOUNTER — Ambulatory Visit
Admission: RE | Admit: 2023-07-16 | Discharge: 2023-07-16 | Disposition: A | Discharge status: Home / Self Care | Payer: BC Managed Care – PPO | Source: Ambulatory Visit | Attending: Hematology and Oncology | Admitting: Hematology and Oncology

## 2023-07-16 DIAGNOSIS — C50912 Malignant neoplasm of unspecified site of left female breast: Secondary | ICD-10-CM | POA: Diagnosis not present

## 2023-07-16 DIAGNOSIS — Z87891 Personal history of nicotine dependence: Secondary | ICD-10-CM | POA: Insufficient documentation

## 2023-07-16 DIAGNOSIS — C50422 Malignant neoplasm of upper-outer quadrant of left male breast: Secondary | ICD-10-CM | POA: Insufficient documentation

## 2023-07-16 DIAGNOSIS — R591 Generalized enlarged lymph nodes: Secondary | ICD-10-CM | POA: Diagnosis not present

## 2023-07-16 DIAGNOSIS — Z170 Estrogen receptor positive status [ER+]: Secondary | ICD-10-CM | POA: Diagnosis not present

## 2023-07-16 DIAGNOSIS — I252 Old myocardial infarction: Secondary | ICD-10-CM | POA: Diagnosis not present

## 2023-07-16 DIAGNOSIS — I509 Heart failure, unspecified: Secondary | ICD-10-CM | POA: Diagnosis not present

## 2023-07-16 DIAGNOSIS — C50919 Malignant neoplasm of unspecified site of unspecified female breast: Secondary | ICD-10-CM | POA: Diagnosis not present

## 2023-07-16 DIAGNOSIS — R590 Localized enlarged lymph nodes: Secondary | ICD-10-CM | POA: Diagnosis not present

## 2023-07-16 DIAGNOSIS — C770 Secondary and unspecified malignant neoplasm of lymph nodes of head, face and neck: Secondary | ICD-10-CM | POA: Diagnosis not present

## 2023-07-16 MED ORDER — MIDAZOLAM HCL 2 MG/2ML IJ SOLN
INTRAMUSCULAR | Status: AC
  Filled 2023-07-16: qty 2

## 2023-07-16 MED ORDER — FENTANYL CITRATE (PF) 100 MCG/2ML IJ SOLN
INTRAMUSCULAR | Status: AC
  Filled 2023-07-16: qty 2

## 2023-07-16 MED ORDER — LIDOCAINE HCL (PF) 1 % IJ SOLN
10.0000 mL | Freq: Once | INTRAMUSCULAR | Status: AC
  Administered 2023-07-16: 10 mL via INTRADERMAL
  Filled 2023-07-16: qty 10

## 2023-07-16 MED ORDER — FENTANYL CITRATE (PF) 100 MCG/2ML IJ SOLN
INTRAMUSCULAR | Status: AC | PRN
Start: 2023-07-16 — End: 2023-07-16
  Administered 2023-07-16 (×2): 50 ug via INTRAVENOUS

## 2023-07-16 MED ORDER — MIDAZOLAM HCL 2 MG/2ML IJ SOLN
INTRAMUSCULAR | Status: AC | PRN
  Administered 2023-07-16 (×2): 1 mg via INTRAVENOUS

## 2023-07-16 NOTE — Discharge Instructions (Signed)
 Needle Biopsy, Care After These instructions tell you how to care for yourself after your procedure. Your doctor may also give you more specific instructions. Call your doctor if you have any problems or questions. What can I expect after the procedure? After the procedure, it is common to have: Soreness. Bruising. Mild pain. Follow these instructions at home:  Return to your normal activities tomorrow. Take over-the-counter and prescription medicines only as told by your doctor. Wash your hands with soap and water before you change your bandage (dressing). If you cannot use soap and water, use hand sanitizer. You may remove your bandage in 2 days Check your puncture site every day for signs of infection. Watch for: Redness, swelling, or pain. Fluid or blood.  Pus or a bad smell. Warmth. You can shower tomorrow Keep all follow-up visits as told by your doctor. This is important. Contact a doctor if you have: A fever. Redness, swelling, or pain at the puncture site, and it lasts longer than a few days. Fluid, blood, or pus coming from the puncture site. Warmth coming from the puncture site. Get help right away if: You have a lot of bleeding from the puncture site. Summary After the procedure, it is common to have soreness, bruising, or mild pain at the puncture site. Check your puncture site every day for signs of infection, such as redness, swelling, or pain. Get help right away if you have severe bleeding from your puncture site. This information is not intended to replace advice given to you by your health care provider. Make sure you discuss any questions you have with your health care provider. Document Revised: 07/08/2017 Document Reviewed: 07/08/2017 Elsevier Patient Education  2020 [employer].

## 2023-07-16 NOTE — Progress Notes (Signed)
 Patient clinically stable post LN US  biopsy per DR [facility] well. Vitals stable pre and post procedure. Hypertensive bp pre and post procedure. Received Versed  2 mg along with Fentanyl  100 mcg IV for procedure. Awake pre and post procedure. Report given to [facility] RN post procedure/specials/17.

## 2023-07-16 NOTE — Procedures (Signed)
 Interventional Radiology Procedure Note  Procedure: [place] CORE BX LEFT SUPRACLAVICULAR ADENOPATHY    Complications: None  Estimated Blood Loss:  MIN  Findings: 73 G CORES IN Vonzell Schlatter, MD

## 2023-07-17 LAB — SURGICAL PATHOLOGY

## 2023-07-23 ENCOUNTER — Other Ambulatory Visit (HOSPITAL_COMMUNITY): Payer: Self-pay

## 2023-07-23 ENCOUNTER — Telehealth: Payer: Self-pay | Admitting: Pharmacy Technician

## 2023-07-23 ENCOUNTER — Encounter: Payer: Self-pay | Admitting: Hematology and Oncology

## 2023-07-23 ENCOUNTER — Inpatient Hospital Stay: Payer: BC Managed Care – PPO | Attending: Hematology and Oncology | Admitting: Hematology and Oncology

## 2023-07-23 VITALS — BP 159/100 | HR 103 | Temp 97.5°F | Resp 18 | Ht 75.0 in | Wt 321.8 lb

## 2023-07-23 DIAGNOSIS — Z9221 Personal history of antineoplastic chemotherapy: Secondary | ICD-10-CM | POA: Insufficient documentation

## 2023-07-23 DIAGNOSIS — Z170 Estrogen receptor positive status [ER+]: Secondary | ICD-10-CM | POA: Insufficient documentation

## 2023-07-23 DIAGNOSIS — Z1732 Human epidermal growth factor receptor 2 negative status: Secondary | ICD-10-CM | POA: Diagnosis not present

## 2023-07-23 DIAGNOSIS — Z79899 Other long term (current) drug therapy: Secondary | ICD-10-CM | POA: Diagnosis not present

## 2023-07-23 DIAGNOSIS — Z923 Personal history of irradiation: Secondary | ICD-10-CM | POA: Insufficient documentation

## 2023-07-23 DIAGNOSIS — Z79810 Long term (current) use of selective estrogen receptor modulators (SERMs): Secondary | ICD-10-CM | POA: Insufficient documentation

## 2023-07-23 DIAGNOSIS — Z9012 Acquired absence of left breast and nipple: Secondary | ICD-10-CM | POA: Diagnosis not present

## 2023-07-23 DIAGNOSIS — C50422 Malignant neoplasm of upper-outer quadrant of left male breast: Secondary | ICD-10-CM | POA: Insufficient documentation

## 2023-07-23 DIAGNOSIS — Z1721 Progesterone receptor positive status: Secondary | ICD-10-CM | POA: Insufficient documentation

## 2023-07-23 MED ORDER — ABEMACICLIB 100 MG PO TABS: 100.0000 mg | ORAL_TABLET | Freq: Two times a day (BID) | ORAL | 3 refills | Status: DC

## 2023-07-23 MED ORDER — METOPROLOL SUCCINATE ER 50 MG PO TB24: 50.0000 mg | ORAL_TABLET | Freq: Every day | ORAL | 3 refills | Status: DC

## 2023-07-23 NOTE — Telephone Encounter (Signed)
 Oral Oncology Patient Advocate Encounter   Received notification that prior authorization for Verzenio  is required.   PA submitted on 07/23/23 Key [id number] Status is pending     Estefana Moellers, CPhT-Adv Oncology Pharmacy Patient Advocate [facility] Direct Number: [phone]  Fax: [phone]

## 2023-07-23 NOTE — Telephone Encounter (Signed)
 Oral Oncology Patient Advocate Encounter   Was successful in obtaining a copay card for Verzenio .  This copay card will make the patients copay $0.  I have spoken with the patient.    The billing information is as follows and has been shared with [facility].   RxBin: [id number] PCN: [id number] Member ID: [id number] Group ID: [id number]   Estefana Moellers, CPhT-Adv Oncology Pharmacy Patient Advocate [facility] Direct Number: [phone]  Fax: [phone]

## 2023-07-23 NOTE — Telephone Encounter (Signed)
 Oral Oncology Patient Advocate Encounter  Prior Authorization for Verzenio  has been approved.    PA# [id number] Effective dates: 07/23/23 through 07/22/25  Patient must fill at [facility].    Estefana Moellers, CPhT-Adv Oncology Pharmacy Patient Advocate [facility] Direct Number: [phone]  Fax: [phone]

## 2023-07-23 NOTE — Progress Notes (Signed)
 Patient Care Team: Pcp, No as PCP - General Glean Stephane BROCKS, RN as Oncology Nurse Navigator Tyree Nanetta SAILOR, RN as Oncology Nurse Navigator Dina Camie FORBES DEVONNA (Neurology)  DIAGNOSIS:  Encounter Diagnosis  Name Primary?   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 CT 08/27/2021 in ED showed 3.8 centimeter left breast mass with left axillary lymph nodes.Mamm and US : 4 cm mass with 5 axillary lymph nodes: Biopsy: Grade 2 IDC, lymph node: Positive, ER 100%, PR 50%, Ki-67 25%, HER2 negative ratio 1.25, copy #3.45   09/25/2021 Cancer Staging   Staging form: Breast, AJCC 8th Edition - Clinical: Stage IIA (cT2, cN1, cM0, G2, ER+, PR+, HER2-) - Signed by Odean Potts, MD on 09/25/2021 Histologic grading system: 3 grade system   10/03/2021 - 01/18/2022 Chemotherapy   Patient is on Treatment Plan : BREAST TC q21d     01/22/2022 Genetic Testing   Negative genetic testing on the CancerNext-Expanded+RNAinsight.  The report date is January 22, 2022.  The CancerNext-Expanded gene panel offered by [facility] and includes sequencing and rearrangement analysis for the following 77 genes: AIP, ALK, APC*, ATM*, AXIN2, BAP1, BARD1, BLM, BMPR1A, BRCA1*, BRCA2*, BRIP1*, CDC73, CDH1*, CDK4, CDKN1B, CDKN2A, CHEK2*, CTNNA1, DICER1, FANCC, FH, FLCN, GALNT12, KIF1B, LZTR1, MAX, MEN1, MET, MLH1*, MSH2*, MSH3, MSH6*, MUTYH*, NBN, NF1*, NF2, NTHL1, PALB2*, PHOX2B, PMS2*, POT1, PRKAR1A, PTCH1, PTEN*, RAD51C*, RAD51D*, RB1, RECQL, RET, SDHA, SDHAF2, SDHB, SDHC, SDHD, SMAD4, SMARCA4, SMARCB1, SMARCE1, STK11, SUFU, TMEM127, TP53*, TSC1, TSC2, VHL and XRCC2 (sequencing and deletion/duplication); EGFR, EGLN1, HOXB13, KIT, MITF, PDGFRA, POLD1,  and POLE (sequencing only); EPCAM and GREM1 (deletion/duplication only). DNA and RNA analyses performed for * genes.      CHIEF COMPLIANT: Follow-up to discuss the results of supraclavicular lymph node biopsy  HISTORY OF PRESENT ILLNESS: Michael Stuart is a 43 year old gentleman with previous history of breast cancer diagnosed in March 2023.  He underwent surgery followed by adjuvant chemotherapy and radiation.  He has been on oral tamoxifen  therapy and circulating tumor DNA testing revealed positive test results which led to CT scans.  CT scan revealed supraclavicular lymph node which on biopsy came back as recurrent breast cancer that was ER positive HER2 negative.  He is here accompanied by his family to discuss her treatment plan.     ALLERGIES:  is allergic to bactrim [sulfamethoxazole-trimethoprim] and sulfacetamide sodium.  MEDICATIONS:  Current Outpatient Medications  Medication Sig Dispense Refill   abemaciclib  (VERZENIO ) 100 MG tablet Take 1 tablet (100 mg total) by mouth 2 (two) times daily. 60 tablet 3   acetaminophen  (TYLENOL ) 500 MG tablet Take 1,500 mg by mouth daily as needed for moderate pain or headache.     atorvastatin  (LIPITOR) 20 MG tablet TAKE 1 TABLET BY MOUTH EVERY DAY 90 tablet 3   dapagliflozin  propanediol (FARXIGA ) 10 MG TABS tablet Take 1 tablet by mouth daily. 90 tablet 2   eplerenone  (INSPRA ) 50 MG tablet Take 1 tablet by mouth daily. 90 tablet 3   metoprolol  succinate (TOPROL -XL) 50 MG 24 hr tablet Take 1 tablet (50 mg total) by mouth daily. 90 tablet 3   neomycin-polymyxin b-dexamethasone  (MAXITROL) 3.5-10000-0.1 OINT Place 1 Application into both eyes.     sacubitril -valsartan  (ENTRESTO ) 97-103 MG Take 1  tablet by mouth 2 (two) times daily. 60 tablet 11   tamoxifen  (NOLVADEX ) 20 MG tablet TAKE 1 TABLET(20 MG) BY MOUTH DAILY 90 tablet 3   No current facility-administered medications for this visit.    PHYSICAL EXAMINATION: ECOG PERFORMANCE STATUS: 1 -  Symptomatic but completely ambulatory  Vitals:   07/23/23 1201  BP: (!) 159/100  Pulse: (!) 103  Resp: 18  Temp: (!) 97.5 F (36.4 C)  SpO2: 98%   Filed Weights   07/23/23 1201  Weight: (!) 321 lb 12.8 oz (146 kg)      LABORATORY DATA:  I have reviewed the data as listed    Latest Ref Rng & Units 06/14/2023    6:02 AM 06/13/2023   10:20 AM 06/11/2022    9:52 AM  CMP  Glucose 70 - 99 mg/dL 868  882  894   BUN 6 - 20 mg/dL 10  10  14    Creatinine 0.61 - 1.24 mg/dL 9.22  8.96  8.96   Sodium 135 - 145 mmol/L 136  138  136   Potassium 3.5 - 5.1 mmol/L 3.9  3.9  4.7   Chloride 98 - 111 mmol/L 104  103  106   CO2 22 - 32 mmol/L 24  24  25    Calcium  8.9 - 10.3 mg/dL 8.5  9.4  8.8   Total Protein 6.5 - 8.1 g/dL  7.3    Total Bilirubin <1.2 mg/dL  0.5    Alkaline Phos 38 - 126 U/L  64    AST 15 - 41 U/L  20    ALT 0 - 44 U/L  31      Lab Results  Component Value Date   WBC 8.0 06/14/2023   HGB 13.9 06/14/2023   HCT 43.0 06/14/2023   MCV 96.8 06/14/2023   PLT 192 06/14/2023   NEUTROABS 11.6 (H) 06/13/2023    ASSESSMENT & PLAN:  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 CT 08/27/2021 in ED showed 3.8 cm left breast mass with left axillary lymph nodes.Mamm and US : 4 cm mass with 5 axillary lymph nodes: Biopsy: Grade 2 IDC, lymph node: Positive, ER 100%, PR 50%, Ki-67 25%, HER2 negative ratio 1.25, copy #3.45 (Recent history of coronary artery disease EF 22% and stroke)   Treatment plan: 1. Neoadjuvant chemotherapy with Taxotere  and Cytoxan  every 3 weeks x6 cycles completed 01/16/2022 2. left mastectomy: Residual grade 2 IDC 3.5 cm, margins negative, 2/2 lymph nodes positive, angiolymphatic invasion present, ER 100%, PR 50%, HER2 negative, Ki-67 25% 3. Followed by adjuvant radiation therapy 04/03/22-05/16/22 4.  Followed by adjuvant antiestrogen therapy with tamoxifen   started November 2023 5. Signatera test positive (0.3)  06/26/2023: Bone scan: Negative, CT CAP: Enlarged left supraclavicular lymph node 2.1 cm, (used to be 0.8 cm) new tiny scattered nonspecific lung nodules (4 mm and 3 mm) -------------------------------------------------------------------------------------------------------------------- 07/16/2023: Biopsy left supraclavicular lymph node: Metastatic poorly differentiated carcinoma consistent with breast ER 75%, PR 75%, Ki67 40%, HER2 1+  Recommendation: Based on the lymph node metastases, I recommended adding Verzinio to tamoxifen . We can continue to monitor Signatera Abemaciclib  counseling: I discussed at length the risks and benefits of Abemaciclib  in combination with letrozole. Adverse effects of Abemaciclib  include decreasing neutrophil count, pneumonia, blood clots in lungs as well as nausea and GI symptoms. Side effects of letrozole include hot flashes, muscle aches and pains, uterine bleeding/spotting/cancer, osteoporosis, risk of blood clots.  Return to clinic with labs and chemo education with Norleen    No orders of the defined types were placed in this encounter.  The patient has a good understanding of the overall plan. he agrees with it. he will call with any problems that may develop before the next visit here. Total time spent: 30 mins including face to face time and time spent for planning, charting and co-ordination of care   Michael K Josely Moffat, MD 07/23/23

## 2023-07-23 NOTE — Assessment & Plan Note (Signed)
 09/15/2021:Palpable left breast mass x8 months. CT 08/27/2021 in ED showed 3.8 cm left breast mass with left axillary lymph nodes.Mamm and US : 4 cm mass with 5 axillary lymph nodes: Biopsy: Grade 2 IDC, lymph node: Positive, ER 100%, PR 50%, Ki-67 25%, HER2 negative ratio 1.25, copy #3.45 (Recent history of coronary artery disease EF 22% and stroke)   Treatment plan: 1. Neoadjuvant chemotherapy with Taxotere  and Cytoxan  every 3 weeks x6 cycles completed 01/16/2022 2. left mastectomy: Residual grade 2 IDC 3.5 cm, margins negative, 2/2 lymph nodes positive, angiolymphatic invasion present, ER 100%, PR 50%, HER2 negative, Ki-67 25% 3. Followed by adjuvant radiation therapy 04/03/22-05/16/22 4.  Followed by adjuvant antiestrogen therapy with tamoxifen  started November 2023 5. Signatera test positive (0.3)  06/26/2023: Bone scan: Negative, CT CAP: Enlarged left supraclavicular lymph node 2.1 cm, (used to be 0.8 cm) new tiny scattered nonspecific lung nodules (4 mm and 3 mm) -------------------------------------------------------------------------------------------------------------------- 07/16/2023: Biopsy left supraclavicular lymph node: Metastatic poorly differentiated carcinoma consistent with breast ER 75%, PR 75%, Ki67 40%, HER2 1+  Recommendation: Based on the lymph node metastases, I recommended adding Verzinio to tamoxifen . We can continue to monitor Signatera Abemaciclib  counseling: I discussed at length the risks and benefits of Abemaciclib  in combination with letrozole. Adverse effects of Abemaciclib  include decreasing neutrophil count, pneumonia, blood clots in lungs as well as nausea and GI symptoms. Side effects of letrozole include hot flashes, muscle aches and pains, uterine bleeding/spotting/cancer, osteoporosis, risk of blood clots.  Return to clinic with labs and chemo education with Norleen

## 2023-07-24 ENCOUNTER — Other Ambulatory Visit (HOSPITAL_COMMUNITY): Payer: Self-pay

## 2023-07-24 NOTE — Telephone Encounter (Signed)
 Oral Oncology Patient Advocate Encounter  Patient must enroll with [facility] before rx can processed at [facility].  [facility] phone [phone]  I have spoken with the patient.  Paulette Borrow, CPhT-Adv Oncology Pharmacy Patient Advocate [facility] Direct Number: [phone]  Fax: [phone]

## 2023-07-25 ENCOUNTER — Ambulatory Visit (HOSPITAL_COMMUNITY): Payer: BC Managed Care – PPO

## 2023-07-26 NOTE — Telephone Encounter (Signed)
Oral Oncology Patient Advocate Encounter  Called [facility] to check status, per the representative they are still wiaitng for confirmation that patient has enrolled in the [facility] program before they can process the rx.  Jinger Neighbors, CPhT-Adv Oncology Pharmacy Patient Advocate [facility] Direct Number: [phone]  Fax: [phone]

## 2023-07-29 NOTE — Progress Notes (Incomplete)
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   HISTORY OF PRESENT ILLNESS: Michael Stuart is a 43 y.o. male with a history of left-sided node positive breast cancer in a male.  The patient originally noted a palpable mass in the left breast for several months and a CT scan in the emergency department in February 2023 showed 3.8 cm mass in the left breast with abnormal appearing left axillary lymph nodes.  By ultrasound this measured 4 cm in the breast and 5 abnormal nodes were present.  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  His node was positive as well that was sampled.  He proceeded with neoadjuvant chemotherapy beginning in March 2023 and his most recent treatment was completed on 01/16/2022.  On 02/21/2022 he proceeded with left mastectomy that showed residual disease the tumor bed measuring up to 3.5 cm and 2 of 2 lymph nodes were positive for disease.  He received adjuvant radiotherapy to the chest wall and regional nodes.   Since completing radiotherapy, the patient has been followed in surveillance with antiestrogen therapy. A CT CAP for restaging on 06/26/23 showed a 2.1 cm left supraclavicular node and a few scattered pulmonary nodules including a 4 mm LUL nodule and a 3 mm RUL nodule. These were felt to be indeterminant. His bone scan that day was negative. A biopsy on 07/16/23 of the supraclavicular node showed metastatic poorly differentiated carcinoma consistent with breast primary that was ER/PR positive, HER2 negative with a Ki 67 of 40%. He's seen to consider additional localized  radiotherapy to the left supraclavicular node.     PREVIOUS RADIATION THERAPY:   04/02/2022 through 05/16/2022 Site Technique Total Dose (Gy) Dose per Fx (Gy) Completed Fx Beam Energies  Chest Wall, Left: CW_L 3D 50.4/50.4 1.8 28/28 15X, 10X  Chest Wall, Left: CW_L_SCLV 3D 50.4/50.4 1.8 28/28 6X, 15X  Chest Wall, Left: CW_L_Bst specialPort 10/10 2 5/5 9E     PAST MEDICAL HISTORY:  Past Medical History:  Diagnosis Date   Breast cancer (HCC)    CHF (congestive heart failure) (HCC)    Myocardial infarction (HCC)    Pneumonia        PAST SURGICAL HISTORY: Past Surgical History:  Procedure Laterality Date   IR IMAGING GUIDED PORT INSERTION  09/28/2021   MASTECTOMY W/ SENTINEL NODE BIOPSY Left 02/21/2022   Procedure: LEFT SIMPLE MASTECTOMY WITH LEFT TARGETED LYMPH NODE BIOPSY;  Surgeon: Harriette Bouillon, MD;  Location: [facility] OR;  Service: General;  Laterality: Left;  GEN w/ PEC BLOCK   PORT-A-CATH REMOVAL Right 02/21/2022   Procedure: PORT REMOVAL;  Surgeon: Harriette Bouillon, MD;  Location: [facility] OR;  Service: General;  Laterality: Right;   RIGHT/LEFT HEART CATH AND CORONARY ANGIOGRAPHY N/A 08/28/2021   Procedure: RIGHT/LEFT HEART CATH AND CORONARY ANGIOGRAPHY;  Surgeon: Elder Negus, MD;  Location: [facility] INVASIVE CV LAB;  Service: Cardiovascular;  Laterality: N/A;     FAMILY HISTORY:  Family History  Problem Relation Age of Onset   Healthy Mother      SOCIAL HISTORY:  reports that he quit smoking about 3 years ago. His smoking use included cigarettes. He started smoking about 28 years ago. He has a 6.3 pack-year smoking history. He has quit using smokeless tobacco. He reports current alcohol use of about 21.0 standard drinks of alcohol per week. He reports that he does not currently use drugs after having used the following drugs: Marijuana. Frequency: 1.00 time per week. The patient lives in [place] and works as a Pharmacist, community for [employer]. He travels frequently to testing  facilities and is often traveling internationally. He has a son and a daughter at home. He is hoping to get back to exercising and weight lifting. ***  ALLERGIES: Bactrim [sulfamethoxazole-trimethoprim] and Sulfacetamide sodium   MEDICATIONS:  Current Outpatient Medications  Medication Sig Dispense Refill   abemaciclib (VERZENIO) 100 MG tablet Take 1 tablet (100 mg total) by mouth 2 (two) times daily. 60 tablet 3   acetaminophen (TYLENOL) 500 MG tablet Take 1,500 mg by mouth daily as needed for moderate pain or headache.     atorvastatin (LIPITOR) 20 MG tablet TAKE 1 TABLET BY MOUTH EVERY DAY 90 tablet 3   dapagliflozin propanediol (FARXIGA) 10 MG TABS tablet Take 1 tablet by mouth daily. 90 tablet 2   eplerenone (INSPRA) 50 MG tablet Take 1 tablet by mouth daily. 90 tablet 3   metoprolol succinate (TOPROL-XL) 50 MG 24 hr tablet Take 1 tablet (50 mg total) by mouth daily. 90 tablet 3   neomycin-polymyxin b-dexamethasone (MAXITROL) 3.5-10000-0.1 OINT Place 1 Application into both eyes.     sacubitril-valsartan (ENTRESTO) 97-103 MG Take 1 tablet by mouth 2 (two) times daily. 60 tablet 11   tamoxifen (NOLVADEX) 20 MG tablet TAKE 1 TABLET(20 MG) BY MOUTH DAILY 90 tablet 3   No current facility-administered medications for this visit.     REVIEW OF SYSTEMS: On review of systems, the patient reports that he is doing okay. His drain site is still bothering him and he's going to start augmentin for what appears to be infection at his drain site. He reports he has had a difficult time with nerve pain in his fingertips and toes, but fingertips are getting better. He is very tired and trying to get out and walk and be more active. No other complaints are noted.      PHYSICAL EXAM:  Wt Readings from Last 3 Encounters:  07/23/23 (!) 321 lb 12.8 oz (146 kg)  07/16/23 (!) 315 lb 4.8 oz (143 kg)  06/15/23 (!) 318 lb 9 oz (144.5 kg)   Temp Readings from Last 3 Encounters:  07/23/23 (!) 97.5 F (36.4  C) (Temporal)  06/15/23 98.2 F (36.8 C) (Oral)  08/30/22 97.7 F (36.5 C) (Temporal)   BP Readings from Last 3 Encounters:  07/23/23 (!) 159/100  07/16/23 (!) 138/103  06/15/23 (!) 144/91   Pulse Readings from Last 3 Encounters:  07/23/23 (!) 103  07/16/23 (!) 112  06/15/23 77      In general this is a well appearing caucasian male in no acute distress. He's alert and oriented x4 and appropriate throughout the examination. Cardiopulmonary assessment is negative for acute distress and he exhibits normal effort.     ECOG = 1  0 - Asymptomatic (Fully active, able to carry on all predisease activities without restriction)  1 - Symptomatic but completely ambulatory (Restricted in physically strenuous activity but ambulatory and able to carry out work of a light or sedentary nature. For example, light housework, office work)  2 - Symptomatic, <50% in bed during the day (Ambulatory and capable of all self care but unable to carry out any work activities. Up and about more than 50% of waking hours)  3 - Symptomatic, >50% in bed, but not bedbound (Capable of only limited self-care, confined to bed or chair 50% or more of waking hours)  4 - Bedbound (Completely disabled. Cannot carry on any self-care. Totally confined to bed or chair)  5 - Death   Santiago Glad MM, Creech RH, Tormey DC, et al. [phone]). "Toxicity and response criteria of the [employer]". Am. Evlyn Clines. Oncol. 5 (6): 649-55    LABORATORY DATA:  Lab Results  Component Value Date   WBC 8.0 06/14/2023   HGB 13.9 06/14/2023   HCT 43.0 06/14/2023   MCV 96.8 06/14/2023   PLT 192 06/14/2023   Lab Results  Component Value Date   NA 136 06/14/2023   K 3.9 06/14/2023   CL 104 06/14/2023   CO2 24 06/14/2023   Lab Results  Component Value Date   ALT 31 06/13/2023   AST 20 06/13/2023   ALKPHOS 64 06/13/2023   BILITOT 0.5 06/13/2023      RADIOGRAPHY: [place] CORE BIOPSY (LYMPH NODES) Result Date:  07/16/2023 INDICATION: Breast cancer, left supraclavicular adenopathy EXAM: ULTRASOUND GUIDED CORE BIOPSY OF LEFT SUPRACLAVICULAR ADENOPATHY MEDICATIONS: 1% LIDOCAINE LOCAL ANESTHESIA/SEDATION: Versed 2.0mg  IV; Fentanyl IV; Moderate Sedation Time:  10 MINUTE The patient was continuously monitored during the procedure by the interventional radiology nurse under my direct supervision. FLUOROSCOPY: Fluoroscopy Time: NONE. COMPLICATIONS: None immediate. PROCEDURE: The procedure, risks, benefits, and alternatives were explained to the patient. Questions regarding the procedure were encouraged and answered. The patient understands and consents to the procedure. Previous imaging reviewed. Preliminary ultrasound performed. The left supraclavicular adenopathy was localized and marked for biopsy. Under sterile conditions and local anesthesia, an 18 gauge core biopsy needle was advanced to the left supraclavicular abnormal node. 3 18 gauge core biopsies obtained. These were placed in formalin. Needle removed. Postprocedure imaging demonstrates no hemorrhage or hematoma. Patient tolerated biopsy well. FINDINGS: Imaging confirms needle placed in the left supraclavicular adenopathy for core biopsy IMPRESSION: Successful ultrasound left supraclavicular adenopathy 18 gauge core biopsy Electronically Signed   By: Judie Petit.  Shick M.D.   On: 07/16/2023 10:59       IMPRESSION/PLAN: 1. Locally recurrent Stage IIA, cT2N1M0 grade 2, ER/PR positive invasive ductal carcinoma of the left breast. Dr. Mitzi Hansen discusses *** We discussed the risks, benefits, short, and long term effects of radiotherapy, as well as the curative intent, and the patient is interested in proceeding. Dr. Mitzi Hansen discusses the delivery and logistics of radiotherapy and anticipates a course of *** weeks of radiotherapy. ***   In a visit lasting *** minutes, greater than 50% of the time was spent face to face discussing the patient's condition, in preparation for  the discussion, and coordinating the patient's care.   The above documentation reflects my direct findings during this shared patient visit. Please see the separate note by Dr. Mitzi Hansen on this date for the remainder of the patient's plan of care.    Osker Mason, [facility]   **Disclaimer: This note was dictated with voice recognition software. Similar sounding words can inadvertently be transcribed and this note may contain transcription errors which may not have been corrected upon publication of note.**

## 2023-07-30 ENCOUNTER — Other Ambulatory Visit (HOSPITAL_COMMUNITY): Payer: Self-pay

## 2023-07-30 DIAGNOSIS — B079 Viral wart, unspecified: Secondary | ICD-10-CM | POA: Diagnosis not present

## 2023-07-30 DIAGNOSIS — B078 Other viral warts: Secondary | ICD-10-CM | POA: Diagnosis not present

## 2023-07-31 ENCOUNTER — Encounter: Payer: Self-pay | Admitting: Radiation Oncology

## 2023-07-31 ENCOUNTER — Other Ambulatory Visit: Payer: Self-pay | Admitting: Radiation Oncology

## 2023-07-31 ENCOUNTER — Other Ambulatory Visit: Payer: Self-pay | Admitting: Pharmacist

## 2023-07-31 ENCOUNTER — Telehealth: Payer: Self-pay | Admitting: *Deleted

## 2023-07-31 ENCOUNTER — Inpatient Hospital Stay: Payer: BC Managed Care – PPO | Admitting: Pharmacist

## 2023-07-31 ENCOUNTER — Other Ambulatory Visit: Payer: Self-pay | Admitting: *Deleted

## 2023-07-31 ENCOUNTER — Inpatient Hospital Stay: Payer: BC Managed Care – PPO

## 2023-07-31 VITALS — BP 148/91 | HR 113 | Temp 98.2°F | Resp 18 | Ht 75.0 in | Wt 320.0 lb

## 2023-07-31 DIAGNOSIS — Z170 Estrogen receptor positive status [ER+]: Secondary | ICD-10-CM

## 2023-07-31 DIAGNOSIS — Z9221 Personal history of antineoplastic chemotherapy: Secondary | ICD-10-CM | POA: Diagnosis not present

## 2023-07-31 DIAGNOSIS — N63 Unspecified lump in unspecified breast: Secondary | ICD-10-CM

## 2023-07-31 DIAGNOSIS — Z1732 Human epidermal growth factor receptor 2 negative status: Secondary | ICD-10-CM | POA: Diagnosis not present

## 2023-07-31 DIAGNOSIS — C50422 Malignant neoplasm of upper-outer quadrant of left male breast: Secondary | ICD-10-CM

## 2023-07-31 DIAGNOSIS — Z9012 Acquired absence of left breast and nipple: Secondary | ICD-10-CM | POA: Diagnosis not present

## 2023-07-31 DIAGNOSIS — Z79810 Long term (current) use of selective estrogen receptor modulators (SERMs): Secondary | ICD-10-CM | POA: Diagnosis not present

## 2023-07-31 DIAGNOSIS — C50922 Malignant neoplasm of unspecified site of left male breast: Secondary | ICD-10-CM

## 2023-07-31 DIAGNOSIS — Z1721 Progesterone receptor positive status: Secondary | ICD-10-CM | POA: Diagnosis not present

## 2023-07-31 DIAGNOSIS — Z79899 Other long term (current) drug therapy: Secondary | ICD-10-CM | POA: Diagnosis not present

## 2023-07-31 DIAGNOSIS — Z923 Personal history of irradiation: Secondary | ICD-10-CM | POA: Diagnosis not present

## 2023-07-31 LAB — CMP (CANCER CENTER ONLY)
ALT: 28 U/L (ref 0–44)
AST: 31 U/L (ref 15–41)
Albumin: 4.3 g/dL (ref 3.5–5.0)
Alkaline Phosphatase: 66 U/L (ref 38–126)
Anion gap: 8 (ref 5–15)
BUN: 12 mg/dL (ref 6–20)
CO2: 26 mmol/L (ref 22–32)
Calcium: 9.5 mg/dL (ref 8.9–10.3)
Chloride: 104 mmol/L (ref 98–111)
Creatinine: 1.09 mg/dL (ref 0.61–1.24)
GFR, Estimated: >60 mL/min (ref >60)
Glucose, Bld: 120 mg/dL — ABNORMAL HIGH (ref 70–99)
Potassium: 4.2 mmol/L (ref 3.5–5.1)
Sodium: 138 mmol/L (ref 135–145)
Total Bilirubin: 0.7 mg/dL (ref 0.0–1.2)
Total Protein: 7.4 g/dL (ref 6.5–8.1)

## 2023-07-31 LAB — CBC WITH DIFFERENTIAL (CANCER CENTER ONLY)
Abs Immature Granulocytes: 0.05 10*3/uL (ref 0.00–0.07)
Basophils Absolute: 0.1 10*3/uL (ref 0.0–0.1)
Basophils Relative: 1 %
Eosinophils Absolute: 0.4 10*3/uL (ref 0.0–0.5)
Eosinophils Relative: 4 %
HCT: 47.5 % (ref 39.0–52.0)
Hemoglobin: 15.8 g/dL (ref 13.0–17.0)
Immature Granulocytes: 1 %
Lymphocytes Relative: 29 %
Lymphs Abs: 3.1 10*3/uL (ref 0.7–4.0)
MCH: 31.2 pg (ref 26.0–34.0)
MCHC: 33.3 g/dL (ref 30.0–36.0)
MCV: 93.7 fL (ref 80.0–100.0)
Monocytes Absolute: 0.7 10*3/uL (ref 0.1–1.0)
Monocytes Relative: 6 %
Neutro Abs: 6.7 10*3/uL (ref 1.7–7.7)
Neutrophils Relative %: 59 %
Platelet Count: 245 10*3/uL (ref 150–400)
RBC: 5.07 MIL/uL (ref 4.22–5.81)
RDW: 13.4 % (ref 11.5–15.5)
WBC Count: 11 10*3/uL — ABNORMAL HIGH (ref 4.0–10.5)
nRBC: 0 % (ref 0.0–0.2)

## 2023-07-31 MED ORDER — GABAPENTIN 300 MG PO CAPS: 300.0000 mg | ORAL_CAPSULE | Freq: Three times a day (TID) | ORAL | 3 refills | Status: DC

## 2023-07-31 MED ORDER — GABAPENTIN 100 MG PO CAPS: 100.0000 mg | ORAL_CAPSULE | Freq: Three times a day (TID) | ORAL | 3 refills | Status: DC

## 2023-07-31 NOTE — Telephone Encounter (Signed)
Called patient to inform of CT for 08-01-23- arrival time- 7:15 am @ [facility], no restrictions to scan, spoke with patient and he is aware of this scan and the instructions

## 2023-07-31 NOTE — Progress Notes (Signed)
I spoke with the pt. His case was discussed in breast oncology conference this morning and he has node positive male breast cancer treated with mastectomy, TAD, chemotherapy and adjuvant postmastectomy radiation now with bx proven left supraclavicular node. Recommendations were for CT neck to rule out further disease, surgical resection if no additional disease was noted. He is not a candidate for additional radiation in the supraclavicular region at this time but we could revisit this if needed in the future. Orders placed, tomorrow's reconsultation appt with me cancelled, will defer to surgery and medical oncology.

## 2023-07-31 NOTE — Progress Notes (Addendum)
[facility]       Telephone: [phone]?Fax: [phone]   Oncology Clinical Pharmacist Practitioner Initial Assessment  Michael Stuart is a 43 y.o. male with a diagnosis of breast cancer. They were contacted today via in-person visit.  Indication/Regimen Abemaciclib (Verzenio) is being used appropriately for treatment of metastatic breast cancer by Dr. Serena Croissant.      Wt Readings from Last 1 Encounters:  07/31/23 (!) 320 lb (145.2 kg)    Estimated body surface area is 2.77 meters squared as calculated from the following:   Height as of this encounter: 6\' 3"  (1.905 m).   Weight as of this encounter: 320 lb (145.2 kg).  The dosing regimen is 100 mg by mouth every 12 hours on days 1 to 28 of a 28-day cycle. This is being given  in combination with tamoxifen . It is planned to continue until treatment plan completion or unacceptable toxicity. Prescription dose and frequency assessed for appropriateness.  Patient has agreed to treatment which is documented in physician note on 07/23/23. Counseled patient on administration, dosing, side effects, monitoring, drug-food interactions, safe handling, storage, and disposal.  Will start abemaciclib once received. [facility] is waiting on [facility] to process information.   Dose Modifications Dr. Pamelia Hoit is starting Michael Stuart at a reduced dose of abemaciclib 100 mg BID  Access Assessment Michael Stuart will be receiving abemaciclib through [facility] Insurance Concerns: none  Start date if known: TBD. Awaiting shipment from [facility]  Adherence Assessment Reviewed importance on keeping a med schedule and plan for any missed doses Barriers to adherence identified? No  Allergies Allergies  Allergen Reactions   Bactrim [Sulfamethoxazole-Trimethoprim] Other (See Comments)    Blood Capillaries Rupture    Sulfacetamide Sodium Other (See Comments)    Vitals    07/31/2023    1:01 PM 07/23/2023   12:01 PM 07/16/2023     9:30 AM  Oncology Vitals  Height 191 cm 191 cm   Weight 145.151 kg 145.968 kg   Weight (lbs) 320 lbs 321 lbs 13 oz   BMI 40 kg/m2 40.22 kg/m2   Temp 98.2 F (36.8 C) 97.5 F (36.4 C)   Pulse Rate 113 103 112  BP 148/91 159/100 138/103  Resp 18 18 21   SpO2 97 % 98 % 96 %  BSA (m2) 2.77 m2 2.78 m2      Laboratory Data    Latest Ref Rng & Units 07/31/2023   12:26 PM 06/14/2023    6:02 AM 06/13/2023   10:20 AM  CBC EXTENDED  WBC 4.0 - 10.5 K/uL 11.0  8.0  14.7   RBC 4.22 - 5.81 MIL/uL 5.07  4.44  4.88   Hemoglobin 13.0 - 17.0 g/dL 29.5  62.1  30.8   HCT 39.0 - 52.0 % 47.5  43.0  45.8   Platelets 150 - 400 K/uL 245  192  246   NEUT# 1.7 - 7.7 K/uL 6.7   11.6   Lymph# 0.7 - 4.0 K/uL 3.1   1.6        Latest Ref Rng & Units 07/31/2023   12:26 PM 06/14/2023    6:02 AM 06/13/2023   10:20 AM  CMP  Glucose 70 - 99 mg/dL 657  846  962   BUN 6 - 20 mg/dL 12  10  10    Creatinine 0.61 - 1.24 mg/dL 9.52  8.41  3.24   Sodium 135 - 145 mmol/L 138  136  138  Potassium 3.5 - 5.1 mmol/L 4.2  3.9  3.9   Chloride 98 - 111 mmol/L 104  104  103   CO2 22 - 32 mmol/L 26  24  24    Calcium 8.9 - 10.3 mg/dL 9.5  8.5  9.4   Total Protein 6.5 - 8.1 g/dL 7.4   7.3   Total Bilirubin 0.0 - 1.2 mg/dL 0.7   0.5   Alkaline Phos 38 - 126 U/L 66   64   AST 15 - 41 U/L 31   20   ALT 0 - 44 U/L 28   31    Contraindications Contraindications were reviewed? Yes Contraindications to therapy were identified? No   Safety Precautions The following safety precautions for the use of abemaciclib were reviewed:  Changes in kidney function: importance of drinking plenty of fluids and monitoring urine output Diarrhea: we reviewed that diarrhea is common with abemaciclib and confirmed that she does have loperamide (Imodium) at home.  We reviewed how to take this medication PRN and gave her information on abemaciclib Decreased white blood cells (WBCs) and increased risk for infection: we discussed the importance of  having a thermometer and what the Centers for Disease Control and Prevention (CDC) considers a fever which is 100.85F (38C) or higher.  Gave patient 24/7 triage line to call if any fevers or symptoms Decreased hemoglobin, part of red blood cells that carry iron and oxygen Fatigue Nausea and Vomiting Hepatotoxicity: reviewed to contact clinic for RUQ pain that will not subside, yellowing of eyes/skin Decreased appetite or weight loss Abdominal pain Decreased platelet count and increased risk for bleeding Venous thromboembolism (VTE): reviewed signs of deep vein thrombosis (DVT) such as leg swelling, redness, pain, or tenderness and signs of pulmonary embolism (PE) such as shortness of breath, rapid or irregular heartbeat, cough, chest pain, or lightheadedness ILD/Pneumonitis: we reviewed potential symptoms including cough, shortness, and fatigue. Handling body fluids and waste Pregnancy, sexual activity, and contraception Avoid grapefruit products Reviewed to take the medication every 12 hours (with food sometimes can be easier on the stomach) and to take it at the same time every day. Discussed proper storage and handling of abemaciclib  Medication Reconciliation Current Outpatient Medications  Medication Sig Dispense Refill   acetaminophen (TYLENOL) 500 MG tablet Take 1,500 mg by mouth daily as needed for moderate pain or headache.     atorvastatin (LIPITOR) 20 MG tablet TAKE 1 TABLET BY MOUTH EVERY DAY 90 tablet 3   eplerenone (INSPRA) 50 MG tablet Take 1 tablet by mouth daily. 90 tablet 3   gabapentin (NEURONTIN) 300 MG capsule Take 1 capsule (300 mg total) by mouth 3 (three) times daily. 90 capsule 3   metoprolol succinate (TOPROL-XL) 50 MG 24 hr tablet Take 1 tablet (50 mg total) by mouth daily. 90 tablet 3   sacubitril-valsartan (ENTRESTO) 97-103 MG Take 1 tablet by mouth 2 (two) times daily. 60 tablet 11   tamoxifen (NOLVADEX) 20 MG tablet TAKE 1 TABLET(20 MG) BY MOUTH DAILY 90 tablet  3   abemaciclib (VERZENIO) 100 MG tablet Take 1 tablet (100 mg total) by mouth 2 (two) times daily. (Patient not taking: Reported on 07/31/2023) 60 tablet 3   No current facility-administered medications for this visit.   Medication reconciliation is based on the patient's most recent medication list in the electronic medical record (EMR) including herbal products and OTC medications.   The patient's medication list was reviewed today with the patient? Yes   Drug-drug interactions (DDIs)  DDIs were evaluated? Yes Significant DDIs identified?  Reviewed potential increase of potassium with eplerenone and  sacubitril and valsartan (Entresto)  Drug-Food Interactions Drug-food interactions were evaluated? Yes Drug-food interactions identified? Grapefruit products  Follow-up Plan  Patient education handout given to patient Start abemaciclib 100 mg by mouth BID once received from [facility]. We contacted [facility] today and spoke to [place]. She thinks [facility] is still processing his information and once that is done, they will schedule shipment. Katrina said to call [facility] on Friday. Phone number given to Mr. Wasmund Continue tamoxifen 20 mg by mouth daily Will start gabapentin 100 mg by mouth TID for neuropathic pain which is located mainly in feet. May be increased if needed. Assess efficacy/toxcity at next visit with Dr. Pamelia Hoit. Sent to local pharmacy of choice. Monitor for toxicities Will add labs, Dr. Pamelia Hoit visit in 3 weeks Dr. Pamelia Hoit will add follow up appointments with clinical pharmacy as deemed necessary Will cancel 08/05/23 and 09/02/23 visits  Dedra Skeens participated in the discussion, expressed understanding, and voiced agreement with the above plan. All questions were answered to her satisfaction. The patient was advised to contact the clinic at [phone] with any questions or concerns prior to her return visit.   I spent 60 minutes assessing the patient.  Avelynn Sellin A. Odetta Pink, PharmD,  BCOP, CPP  Anselm Lis, RPH-CPP, 07/31/2023 1:35 PM  **Disclaimer: This note was dictated with voice recognition software. Similar sounding words can inadvertently be transcribed and this note may contain transcription errors which may not have been corrected upon publication of note.**

## 2023-08-01 ENCOUNTER — Encounter: Payer: Self-pay | Admitting: Radiation Oncology

## 2023-08-01 ENCOUNTER — Ambulatory Visit: Payer: BC Managed Care – PPO

## 2023-08-01 ENCOUNTER — Telehealth: Payer: Self-pay | Admitting: Hematology and Oncology

## 2023-08-01 ENCOUNTER — Ambulatory Visit (HOSPITAL_COMMUNITY)
Admission: RE | Admit: 2023-08-01 | Discharge: 2023-08-01 | Disposition: A | Discharge status: Home / Self Care | Payer: BC Managed Care – PPO | Source: Ambulatory Visit | Attending: Radiation Oncology | Admitting: Radiation Oncology

## 2023-08-01 ENCOUNTER — Ambulatory Visit
Admission: RE | Admit: 2023-08-01 | Discharge: 2023-08-01 | Disposition: A | Discharge status: Home / Self Care | Payer: BC Managed Care – PPO | Source: Ambulatory Visit | Attending: Radiation Oncology | Admitting: Radiation Oncology

## 2023-08-01 DIAGNOSIS — C50929 Malignant neoplasm of unspecified site of unspecified male breast: Secondary | ICD-10-CM | POA: Diagnosis not present

## 2023-08-01 DIAGNOSIS — Z170 Estrogen receptor positive status [ER+]: Secondary | ICD-10-CM

## 2023-08-01 DIAGNOSIS — C50922 Malignant neoplasm of unspecified site of left male breast: Secondary | ICD-10-CM | POA: Insufficient documentation

## 2023-08-01 DIAGNOSIS — C50422 Malignant neoplasm of upper-outer quadrant of left male breast: Secondary | ICD-10-CM

## 2023-08-01 DIAGNOSIS — C779 Secondary and unspecified malignant neoplasm of lymph node, unspecified: Secondary | ICD-10-CM | POA: Diagnosis not present

## 2023-08-01 DIAGNOSIS — R2232 Localized swelling, mass and lump, left upper limb: Secondary | ICD-10-CM | POA: Diagnosis not present

## 2023-08-01 DIAGNOSIS — M47812 Spondylosis without myelopathy or radiculopathy, cervical region: Secondary | ICD-10-CM | POA: Diagnosis not present

## 2023-08-01 MED ORDER — IOHEXOL 300 MG/ML  SOLN
80.0000 mL | Freq: Once | INTRAMUSCULAR | Status: AC | PRN
Start: 2023-08-01 — End: 2023-08-01
  Administered 2023-08-01: 80 mL via INTRAVENOUS

## 2023-08-01 NOTE — Telephone Encounter (Signed)
Called patient spouse number left message of scheduled f/u appt per Jonny Ruiz for Dr. Pamelia Hoit in 3 weeks. Message left to call back and rescheduled if appt doesn't work.

## 2023-08-02 ENCOUNTER — Other Ambulatory Visit (HOSPITAL_COMMUNITY): Payer: Self-pay

## 2023-08-02 NOTE — Telephone Encounter (Signed)
Oral Oncology Patient Advocate Encounter  Confirmed in [facility] portal that rx has been scheduled for delivery to patient. Estimated delivery is 08/06/23.  Jinger Neighbors, CPhT-Adv Oncology Pharmacy Patient Advocate [facility] Direct Number: [phone]  Fax: [phone]

## 2023-08-05 ENCOUNTER — Inpatient Hospital Stay: Payer: BC Managed Care – PPO | Admitting: Hematology and Oncology

## 2023-08-06 ENCOUNTER — Other Ambulatory Visit: Payer: Self-pay | Admitting: *Deleted

## 2023-08-06 ENCOUNTER — Telehealth (INDEPENDENT_AMBULATORY_CARE_PROVIDER_SITE_OTHER): Payer: Self-pay

## 2023-08-06 DIAGNOSIS — C8591 Non-Hodgkin lymphoma, unspecified, lymph nodes of head, face, and neck: Secondary | ICD-10-CM

## 2023-08-06 NOTE — Progress Notes (Signed)
Per MD, Dr. 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  MD requesting Urgent referral be placed to [facility] for pt to be seen this week.  Referral placed.  ENT office notified and states they will review with available providers for availability.

## 2023-08-06 NOTE — Telephone Encounter (Signed)
Dear Adelina Mings,  Dr Irene Pap wanted to clarify as to what we are seeing this patient for, is it due to the depth of the node?   Please advise  Thanks, Wenda Overland, CMA

## 2023-08-07 ENCOUNTER — Ambulatory Visit (INDEPENDENT_AMBULATORY_CARE_PROVIDER_SITE_OTHER): Payer: BC Managed Care – PPO | Admitting: Otolaryngology

## 2023-08-07 ENCOUNTER — Encounter (INDEPENDENT_AMBULATORY_CARE_PROVIDER_SITE_OTHER): Payer: Self-pay | Admitting: Otolaryngology

## 2023-08-07 VITALS — BP 150/100 | HR 139 | Ht 75.0 in | Wt 315.0 lb

## 2023-08-07 DIAGNOSIS — R590 Localized enlarged lymph nodes: Secondary | ICD-10-CM | POA: Diagnosis not present

## 2023-08-07 DIAGNOSIS — C50922 Malignant neoplasm of unspecified site of left male breast: Secondary | ICD-10-CM | POA: Diagnosis not present

## 2023-08-07 DIAGNOSIS — Z170 Estrogen receptor positive status [ER+]: Secondary | ICD-10-CM | POA: Diagnosis not present

## 2023-08-07 NOTE — Progress Notes (Unsigned)
ENT CONSULT:  Reason for Consult: left supraclavicular lymphadenopathy hx of metastatic breast cancer    HPI: Discussed the use of AI scribe software for clinical note transcription with the patient, who gave verbal consent to proceed.  History of Present Illness   The patient is a 46 yoM with hx of breast cancer (left side), presents for evaluation of left supraclavicular lymphadenopathy. He was referred by Dr. Pamelia Hoit, Oncology.  The patient was noted to have a lump in the left posterior neck triangle/above the clavicle on surveillance CT scan 2/2 elevated cancer markers end of last year. A needle biopsy has been performed, and recent imaging showed multiple smaller but asymmetric and suspicious lymph nodes on CT neck in addition to this lymph although he did not meet the criteria for pathologic size based on imaging.   He has a history of breast cancer and is currently on Verzenio (abemaciclib) as immunotherapy, and has been on Tamoxifen. He is not a candidate for radiation therapy for left supraclavicular lymph node.   He has a history of a heart attack but is not currently on any blood thinners. Was diagnosed with breast cancer, while in the hospital undergoing workup for MI.      Records Reviewed:  Office visit with Dr Pamelia Hoit Oncology 07/04/23 HEMATOLOGY-ONCOLOGY TELEPHONE VISIT PROGRESS NOTE   I connected with our patient on 07/04/23 at  8:15 AM EST by telephone and verified that I am speaking with the correct person using two identifiers.  I discussed the limitations, risks, security and privacy concerns of performing an evaluation and management service by telephone and the availability of in person appointments.  I also discussed with the patient that there may be a patient responsible charge related to this service. The patient expressed understanding and agreed to proceed.    History of Present Illness: Follow-up to discuss results of CT scans History of Present Illness   The  patient, with a history of cancer, presents for telephone follow-up after a recent hospitalization for an infection and cellulitis. He reports that the infection and cellulitis have resolved. However, a recent CT scan revealed an enlarged lymph node above the left collarbone, which was not visible on the last scan but was present and smaller on previous scans. The patient confirms that he can feel the lymph node. The CT scan also revealed tiny nodules in the lungs, which were not mentioned in previous scans.         Enlarged Lymph Node Noted on recent CT scan, increased in size from 0.8cm in 2023 to 2.1cm currently. Differential includes infection vs malignancy. Patient can palpate the node. -Plan for biopsy of the lymph node. -If pathology confirms malignancy, consider adding Verzenio to current treatment regimen.   Pulmonary Nodules Two small nodules (3mm and 4mm) noted in the right and left lung apices on recent CT scan. Too small to biopsy, etiology uncertain. -Monitor with serial imaging.   Cellulitis Recent hospitalization for cellulitis, now resolved. -No further action required at this time.   Breast Cancer Patient currently on Tamoxifen. Treatment plan may be adjusted pending results of lymph node biopsy. -Continue Tamoxifen until biopsy results are available.   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 CT 08/27/2021 in ED showed 3.8 centimeter left breast mass with left axillary lymph nodes.Mamm and [place]: 4 cm mass with 5 axillary lymph nodes:  Biopsy: Grade 2 IDC, lymph node: Positive, ER 100%, PR 50%, Ki-67 25%, HER2 negative ratio 1.25, copy #3.45    09/25/2021 Cancer Staging    Staging form: Breast, AJCC 8th Edition - Clinical: Stage IIA (cT2, cN1, cM0, G2, ER+, PR+, HER2-) - Signed by Serena Croissant, MD on 09/25/2021 Histologic grading  system: 3 grade system    10/03/2021 - 01/18/2022 Chemotherapy    Patient is on Treatment Plan : BREAST TC q21d    Treatment plan: 1. Neoadjuvant chemotherapy with Taxotere and Cytoxan every 3 weeks x6 cycles completed 01/16/2022 2. left mastectomy: Residual grade 2 IDC 3.5 cm, margins negative, 2/2 lymph nodes positive, angiolymphatic invasion present, ER 100%, PR 50%, HER2 negative, Ki-67 25% 3. Followed by adjuvant radiation therapy 04/03/22-05/16/22 4.  Followed by adjuvant antiestrogen therapy with tamoxifen started November 2023 5. 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 We can continue to monitor Signatera Abemaciclib counseling: I discussed at length the risks and benefits of Abemaciclib in combination with letrozole. Adverse effects of Abemaciclib include decreasing neutrophil count, pneumonia, blood clots in lungs as well as nausea and GI symptoms. Side effects of letrozole include hot flashes, muscle aches and pains, uterine bleeding/spotting/cancer, osteoporosis, risk of blood clots.   Past Medical History:  Diagnosis Date   Breast cancer (HCC)    CHF (congestive heart failure) (HCC)    Myocardial infarction (HCC)    Pneumonia     Past Surgical History:  Procedure Laterality Date   IR IMAGING GUIDED PORT INSERTION  09/28/2021   MASTECTOMY W/ SENTINEL NODE BIOPSY Left 02/21/2022   Procedure: LEFT SIMPLE MASTECTOMY WITH LEFT TARGETED LYMPH NODE BIOPSY;  Surgeon:  Harriette Bouillon, MD;  Location: [facility] OR;  Service: General;  Laterality: Left;  GEN w/ PEC BLOCK   PORT-A-CATH REMOVAL Right 02/21/2022   Procedure: PORT REMOVAL;  Surgeon: Harriette Bouillon, MD;  Location: [facility] OR;  Service: General;  Laterality: Right;   RIGHT/LEFT HEART CATH AND CORONARY ANGIOGRAPHY N/A 08/28/2021   Procedure: RIGHT/LEFT HEART CATH AND CORONARY ANGIOGRAPHY;  Surgeon: Elder Negus, MD;  Location: [facility] INVASIVE CV LAB;  Service: Cardiovascular;  Laterality: N/A;    Family History  Problem Relation Age of Onset   Healthy Mother     Social History:  reports that he quit smoking about 3 years ago. His smoking use included cigarettes. He started smoking about 28 years ago. He has a 6.3 pack-year smoking history. He has quit using smokeless tobacco. He reports current alcohol use of about 21.0 standard drinks of alcohol per week. He reports that he does not currently use drugs after having used the following drugs: Marijuana. Frequency: 1.00 time per week.  Allergies:  Allergies  Allergen Reactions   Bactrim [Sulfamethoxazole-Trimethoprim] Other (See Comments)    Blood Capillaries Rupture    Sulfacetamide Sodium Other (See Comments)    Medications: I have reviewed the patient's current medications.  The PMH, PSH, Medications, Allergies, and SH were reviewed and updated.  ROS: Constitutional: Negative for fever, weight loss and weight gain. Cardiovascular: Negative for chest pain and dyspnea on exertion. Respiratory: Is not experiencing shortness of breath  at rest. Gastrointestinal: Negative for nausea and vomiting. Neurological: Negative for headaches. Psychiatric: The patient is not nervous/anxious  Blood pressure (!) 150/100, pulse (!) 139, height 6\' 3"  (1.905 m), weight (!) 315 lb (142.9 kg), SpO2 95%.  PHYSICAL EXAM:  Exam: General: Well-developed, well-nourished Communication and Voice: Clear pitch and clarity Respiratory Respiratory effort: Equal  inspiration and expiration without stridor Cardiovascular Peripheral Vascular: Warm extremities with equal color/perfusion Eyes: No nystagmus with equal extraocular motion bilaterally Neuro/Psych/Balance: Patient oriented to person, place, and time; Appropriate mood and affect; Gait is intact with no imbalance; Cranial nerves I-XII are intact Head and Face Inspection: Normocephalic and atraumatic without mass or lesion Palpation: Facial skeleton intact without bony stepoffs Salivary Glands: No mass or tenderness Facial Strength: Facial motility symmetric and full bilaterally ENT Pinna: External ear intact and fully developed External canal: Canal is patent with intact skin Tympanic Membrane: Clear and mobile External Nose: No scar or anatomic deformity Internal Nose: Septum is relatively straight on anterior rhinoscopy. No polyp, or purulence. Mucosal edema and erythema present.  Bilateral inferior turbinate hypertrophy.  Lips, Teeth, and gums: Mucosa and teeth intact and viable TMJ: No pain to palpation with full mobility Oral cavity/oropharynx: No erythema or exudate, no lesions present Neck Neck and Trachea: Midline trachea without mass or lesion Thyroid: No mass or nodularity Lymphatics: left posterior triangle area with palpable node in   Procedure: none  Studies Reviewed: CT soft tissue neck 08/01/23 FINDINGS: Pharynx and larynx: Mild motion artifact. Larynx and pharynx soft tissue contours are within normal limits. Parapharyngeal and retropharyngeal spaces are within normal limits.   Salivary glands: Negative sublingual space. Submandibular and parotid glands are within normal limits.   Thyroid: Negative.   Lymph nodes: Bulky and round, spiculated soft tissue mass left supraclavicular fossa series 2, image 89 and sagittal image 105 is nearly 3 cm diameter and appears slightly larger since last month. This is most compatible with metastatic ex nodal  disease. Surrounding smaller but asymmetrically numerous left level 4 lymph nodes series 2, image 97.   Other bilateral cervical lymph node stations appear more symmetric and more normal. There are left level 1 lymph nodes individually up to 8 mm short axis. But no other abnormally round, enlarged, or heterogeneous node.   Vascular: Limited intravascular contrast, grossly patent major vascular structures in the neck and at the skull base.   Limited intracranial: Small area of encephalomalacia visible at the left occipital pole, corresponding to 2023 ischemia. Otherwise grossly negative, no evidence of intracranial mass effect or ventriculomegaly.   Visualized orbits: Negative.   Mastoids and visualized paranasal sinuses: Bilateral paranasal sinuses, tympanic cavities and mastoids are well aerated.   Skeleton: Cervical spine degeneration most pronounced at C3-C4 on the right including endplate spurring and facet arthropathy there. No acute or suspicious osseous lesion identified.   Upper chest: Stable.   IMPRESSION: 1. Relatively large, nearly 3 cm left supraclavicular fossa rounded and spiculated soft tissue mass is most likely a nodal metastasis with extra-capsular extension. Multiple smaller but asymmetric and suspicious surrounding lymph nodes there. All other cervical nodes remain within normal limits by imaging criteria. 2. No other metastatic disease identified in the Neck. 3. 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   Core biopsy 07/16/23  1. Lymph node, biopsy, Left supraclavicular :       - METASTATIC POORLY DIFFERENTIATED CARCINOMA CONSISTENT WITH BREAST ORIGIN   Assessment/Plan: Encounter Diagnoses  Name Primary?   Cervical lymphadenopathy Yes   Malignant  neoplasm of left breast in male, estrogen receptor positive, unspecified site of breast (HCC)     Assessment and Plan    Metastatic Breast Cancer to the left supraclavicular/posterior triangle node  ~ 3 cm. Imaging revealed multiple smaller, asymmetric, suspicious lymph nodes not pathologic by size criteria. Discussed that surgery is typically for curative or diagnostic purposes. Discussed with Dr Pamelia Hoit, who referred the patient for removal of what appears to be an oligometastatic disease at this point. In light of the size and location, we discussed that risks of the surgical removal might outweigh benefits, and there is a good chance that even if this lymph node is removed surgically, systemic therapy will be required.  - I had a detailed discussion with the patient about risks and benefits of surgery and recommend against surgical removal at this time - Consider referral to [facility] for a second opinion if specialized expertise in head and neck oncology is required or patient would like a second opinion  Myocardial Infarction History of myocardial infarction, not on anticoagulants. Hx of MI and reduced EF.   I spent 45 minutes in total face-to-face time and in reviewing records during pre-charting, more than 50% of which was spent in counseling and coordination of care, reviewing test results, reviewing medications and treatment regimen and/or in discussing or reviewing the diagnosis, the prognosis and treatment options. Pertinent laboratory and imaging test results that were available during this visit with the patient were reviewed by me and considered in my medical decision making (see chart for details).      Thank you for allowing me to participate in the care of this patient. Please do not hesitate to contact me with any questions or concerns.   Ashok Croon, MD Otolaryngology [facility] Phone: [phone] Fax: [phone]    08/08/2023, 4:05 PM

## 2023-08-08 ENCOUNTER — Encounter: Payer: Self-pay | Admitting: *Deleted

## 2023-08-08 DIAGNOSIS — C8591 Non-Hodgkin lymphoma, unspecified, lymph nodes of head, face, and neck: Secondary | ICD-10-CM

## 2023-08-09 NOTE — Telephone Encounter (Signed)
Per secured chat patient is scheduled for 2/4 @ 11. Patient was notified of updated

## 2023-08-12 NOTE — Assessment & Plan Note (Signed)
09/15/2021:Palpable left breast mass x8 months. CT 08/27/2021 in ED showed 3.8 cm left breast mass with left axillary lymph nodes.Mamm and [place]: 4 cm mass with 5 axillary lymph nodes: Biopsy: Grade 2 IDC, lymph node: Positive, ER 100%, PR 50%, Ki-67 25%, HER2 negative ratio 1.25, copy #3.45 (Recent history of coronary artery disease EF 22% and stroke)   Treatment plan: 1. Neoadjuvant chemotherapy with Taxotere and Cytoxan every 3 weeks x6 cycles completed 01/16/2022 2. left mastectomy: Residual grade 2 IDC 3.5 cm, margins negative, 2/2 lymph nodes positive, angiolymphatic invasion present, ER 100%, PR 50%, HER2 negative, Ki-67 25% 3. Followed by adjuvant radiation therapy 04/03/22-05/16/22 4.  Followed by adjuvant antiestrogen therapy with tamoxifen started November 2023 5. 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 Not a candidate for surgical excision or radiation  We can continue to monitor Signatera Abemaciclib toxicities:  Return to clinic in 1 month with labs and follow-up

## 2023-08-13 ENCOUNTER — Encounter: Payer: Self-pay | Admitting: *Deleted

## 2023-08-13 ENCOUNTER — Other Ambulatory Visit: Payer: Self-pay | Admitting: *Deleted

## 2023-08-13 ENCOUNTER — Inpatient Hospital Stay: Payer: BC Managed Care – PPO | Attending: Hematology and Oncology | Admitting: Hematology and Oncology

## 2023-08-13 VITALS — BP 149/104 | HR 128 | Temp 98.1°F | Resp 21 | Ht 75.0 in | Wt 315.5 lb

## 2023-08-13 DIAGNOSIS — C50422 Malignant neoplasm of upper-outer quadrant of left male breast: Secondary | ICD-10-CM | POA: Diagnosis not present

## 2023-08-13 DIAGNOSIS — Z79899 Other long term (current) drug therapy: Secondary | ICD-10-CM | POA: Diagnosis not present

## 2023-08-13 DIAGNOSIS — Z170 Estrogen receptor positive status [ER+]: Secondary | ICD-10-CM

## 2023-08-13 DIAGNOSIS — C8591 Non-Hodgkin lymphoma, unspecified, lymph nodes of head, face, and neck: Secondary | ICD-10-CM

## 2023-08-13 DIAGNOSIS — Z9013 Acquired absence of bilateral breasts and nipples: Secondary | ICD-10-CM | POA: Diagnosis not present

## 2023-08-13 DIAGNOSIS — Z1721 Progesterone receptor positive status: Secondary | ICD-10-CM | POA: Diagnosis not present

## 2023-08-13 DIAGNOSIS — C770 Secondary and unspecified malignant neoplasm of lymph nodes of head, face and neck: Secondary | ICD-10-CM | POA: Diagnosis not present

## 2023-08-13 DIAGNOSIS — Z79810 Long term (current) use of selective estrogen receptor modulators (SERMs): Secondary | ICD-10-CM | POA: Diagnosis not present

## 2023-08-13 DIAGNOSIS — Z1732 Human epidermal growth factor receptor 2 negative status: Secondary | ICD-10-CM | POA: Insufficient documentation

## 2023-08-13 NOTE — Progress Notes (Signed)
 Patient Care Team: Pcp, No as PCP - General Glean Stephane BROCKS, RN as Oncology Nurse Navigator Tyree Nanetta SAILOR, RN as Oncology Nurse Navigator Dina Camie FORBES DEVONNA (Neurology) Odean Potts, MD as Medical Oncologist (Hematology and Oncology)  DIAGNOSIS:  Encounter Diagnosis  Name Primary?   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 CT 08/27/2021 in ED showed 3.8 centimeter left breast mass with left axillary lymph nodes.Mamm and US : 4 cm mass with 5 axillary lymph nodes: Biopsy: Grade 2 IDC, lymph node: Positive, ER 100%, PR 50%, Ki-67 25%, HER2 negative ratio 1.25, copy #3.45   09/25/2021 Cancer Staging   Staging form: Breast, AJCC 8th Edition - Clinical: Stage IIA (cT2, cN1, cM0, G2, ER+, PR+, HER2-) - Signed by Odean Potts, MD on 09/25/2021 Histologic grading system: 3 grade system   10/03/2021 - 01/18/2022 Chemotherapy   Patient is on Treatment Plan : BREAST TC q21d     01/22/2022 Genetic Testing   Negative genetic testing on the CancerNext-Expanded+RNAinsight.  The report date is January 22, 2022.  The CancerNext-Expanded gene panel offered by [facility] and includes sequencing and rearrangement analysis for the following 77 genes: AIP, ALK, APC*, ATM*, AXIN2, BAP1, BARD1, BLM, BMPR1A, BRCA1*, BRCA2*, BRIP1*, CDC73, CDH1*, CDK4, CDKN1B, CDKN2A, CHEK2*, CTNNA1, DICER1, FANCC, FH, FLCN, GALNT12, KIF1B, LZTR1, MAX, MEN1, MET, MLH1*, MSH2*, MSH3, MSH6*, MUTYH*, NBN, NF1*, NF2, NTHL1, PALB2*, PHOX2B, PMS2*, POT1, PRKAR1A, PTCH1, PTEN*, RAD51C*, RAD51D*, RB1, RECQL, RET, SDHA, SDHAF2, SDHB, SDHC, SDHD, SMAD4, SMARCA4, SMARCB1, SMARCE1, STK11, SUFU, TMEM127, TP53*, TSC1, TSC2, VHL and XRCC2 (sequencing and  deletion/duplication); EGFR, EGLN1, HOXB13, KIT, MITF, PDGFRA, POLD1, and POLE (sequencing only); EPCAM and GREM1 (deletion/duplication only). DNA and RNA analyses performed for * genes.      CHIEF COMPLIANT: Follow-up on Verzinio  HISTORY OF PRESENT ILLNESS: Michael Stuart is a 43 year old gentleman with isolated left supraclavicular lymph node metastases who is here to discuss the treatment plan.  He had been to ENT who did not recommend surgery because of the location of the lymph node.  He is concerned that the tumor may have enlarged since then.  He started Verzinio last week and appears to be tolerating it fairly well.  He did have a episode of stomach upset and loose stools.   ALLERGIES:  is allergic to bactrim [sulfamethoxazole-trimethoprim] and sulfacetamide sodium.  MEDICATIONS:  Current Outpatient Medications  Medication Sig Dispense Refill   abemaciclib  (VERZENIO ) 100 MG tablet Take 1 tablet (100 mg total) by mouth 2 (two) times daily. 60 tablet 3   acetaminophen  (TYLENOL ) 500 MG tablet Take 1,500 mg by mouth daily as needed for moderate pain or headache.     atorvastatin  (LIPITOR) 20 MG tablet TAKE 1 TABLET BY MOUTH EVERY DAY 90 tablet 3   eplerenone  (INSPRA ) 50 MG tablet Take 1 tablet by mouth daily. 90 tablet 3   gabapentin  (NEURONTIN ) 100 MG capsule Take 1 capsule (100 mg total) by mouth 3 (three) times daily. 90 capsule 3   metoprolol  succinate (TOPROL -XL) 50 MG 24 hr tablet Take 1 tablet (50 mg total) by mouth daily. 90 tablet 3   sacubitril -valsartan  (ENTRESTO ) 97-103 MG Take 1 tablet by mouth 2 (two) times daily. 60 tablet 11   tamoxifen  (NOLVADEX ) 20 MG  tablet TAKE 1 TABLET(20 MG) BY MOUTH DAILY 90 tablet 3   No current facility-administered medications for this visit.    PHYSICAL EXAMINATION: ECOG PERFORMANCE STATUS: 1 - Symptomatic but completely ambulatory  Vitals:   08/13/23 1121  BP: (!) 149/104  Pulse: (!) 128  Resp: (!) 21  Temp: 98.1 F (36.7 C)  SpO2: 95%    Filed Weights   08/13/23 1121  Weight: (!) 315 lb 8 oz (143.1 kg)    Physical Exam          (exam performed in the presence of a chaperone)  LABORATORY DATA:  I have reviewed the data as listed    Latest Ref Rng & Units 07/31/2023   12:26 PM 06/14/2023    6:02 AM 06/13/2023   10:20 AM  CMP  Glucose 70 - 99 mg/dL 879  868  882   BUN 6 - 20 mg/dL 12  10  10    Creatinine 0.61 - 1.24 mg/dL 8.90  9.22  8.96   Sodium 135 - 145 mmol/L 138  136  138   Potassium 3.5 - 5.1 mmol/L 4.2  3.9  3.9   Chloride 98 - 111 mmol/L 104  104  103   CO2 22 - 32 mmol/L 26  24  24    Calcium  8.9 - 10.3 mg/dL 9.5  8.5  9.4   Total Protein 6.5 - 8.1 g/dL 7.4   7.3   Total Bilirubin 0.0 - 1.2 mg/dL 0.7   0.5   Alkaline Phos 38 - 126 U/L 66   64   AST 15 - 41 U/L 31   20   ALT 0 - 44 U/L 28   31     Lab Results  Component Value Date   WBC 11.0 (H) 07/31/2023   HGB 15.8 07/31/2023   HCT 47.5 07/31/2023   MCV 93.7 07/31/2023   PLT 245 07/31/2023   NEUTROABS 6.7 07/31/2023    ASSESSMENT & PLAN:  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 CT 08/27/2021 in ED showed 3.8 cm left breast mass with left axillary lymph nodes.Mamm and US : 4 cm mass with 5 axillary lymph nodes: Biopsy: Grade 2 IDC, lymph node: Positive, ER 100%, PR 50%, Ki-67 25%, HER2 negative ratio 1.25, copy #3.45 (Recent history of coronary artery disease EF 22% and stroke)   Treatment plan: 1. Neoadjuvant chemotherapy with Taxotere  and Cytoxan  every 3 weeks x6 cycles completed 01/16/2022 2. left mastectomy: Residual grade 2 IDC 3.5 cm, margins negative, 2/2 lymph nodes positive, angiolymphatic invasion present, ER 100%, PR 50%, HER2 negative, Ki-67 25% 3. Followed by adjuvant radiation therapy 04/03/22-05/16/22 4.  Followed by adjuvant antiestrogen therapy with tamoxifen  started November 2023 5. Signatera test positive (0.3)   06/26/2023: Bone scan: Negative, CT CAP: Enlarged left supraclavicular lymph node 2.1 cm, (used to be 0.8 cm) new tiny scattered nonspecific lung nodules (4 mm and 3 mm) -------------------------------------------------------------------------------------------------------------------- 07/16/2023: Biopsy left supraclavicular lymph node: Metastatic poorly differentiated carcinoma consistent with breast ER 75%, PR 75%, Ki67 40%, HER2 1+   Recommendation: Based on the lymph node metastases, I recommended adding Verzinio to tamoxifen . Not a candidate for surgical excision or radiation  We can continue to monitor Signatera Abemaciclib  toxicities: Tolerating abemaciclib  extremely well.  Occasional stomach upset and loose stools. He will come back in 2 weeks for follow-up with [place]leen.  If at that time he is tolerating it well we may  increase the dosage to 150 p.o. twice daily.  No orders of the defined types were placed in this encounter.  The patient has a good understanding of the overall plan. he agrees with it. he will call with any problems that may develop before the next visit here. Total time spent: 30 mins including face to face time and time spent for planning, charting and co-ordination of care   Viinay K Lachlyn Vanderstelt, MD 08/13/23

## 2023-08-13 NOTE — Progress Notes (Signed)
Per MD request RN successfully faxed urgent referral to Dr. Pollyann Kennedy with [facility] [phone].

## 2023-08-13 NOTE — Progress Notes (Signed)
Renewal orders placed for Signatera testing.

## 2023-08-13 NOTE — Progress Notes (Signed)
Per MD request RN successfully faxed urgent referral to [facility] at [phone].

## 2023-08-15 DIAGNOSIS — C770 Secondary and unspecified malignant neoplasm of lymph nodes of head, face and neck: Secondary | ICD-10-CM | POA: Diagnosis not present

## 2023-08-20 ENCOUNTER — Other Ambulatory Visit: Payer: Self-pay

## 2023-08-20 ENCOUNTER — Encounter (HOSPITAL_COMMUNITY)
Admission: RE | Admit: 2023-08-20 | Discharge: 2023-08-20 | Disposition: A | Discharge status: Home / Self Care | Payer: BC Managed Care – PPO | Source: Ambulatory Visit | Attending: Otolaryngology

## 2023-08-20 ENCOUNTER — Encounter (HOSPITAL_COMMUNITY): Payer: Self-pay

## 2023-08-20 VITALS — BP 139/95 | HR 101 | Temp 98.5°F | Resp 19 | Ht 75.0 in | Wt 327.0 lb

## 2023-08-20 DIAGNOSIS — Z8673 Personal history of transient ischemic attack (TIA), and cerebral infarction without residual deficits: Secondary | ICD-10-CM | POA: Insufficient documentation

## 2023-08-20 DIAGNOSIS — Z87891 Personal history of nicotine dependence: Secondary | ICD-10-CM | POA: Diagnosis not present

## 2023-08-20 DIAGNOSIS — Z7969 Long term (current) use of other immunomodulators and immunosuppressants: Secondary | ICD-10-CM | POA: Insufficient documentation

## 2023-08-20 DIAGNOSIS — I5082 Biventricular heart failure: Secondary | ICD-10-CM | POA: Insufficient documentation

## 2023-08-20 DIAGNOSIS — I428 Other cardiomyopathies: Secondary | ICD-10-CM | POA: Diagnosis not present

## 2023-08-20 DIAGNOSIS — C770 Secondary and unspecified malignant neoplasm of lymph nodes of head, face and neck: Secondary | ICD-10-CM | POA: Diagnosis not present

## 2023-08-20 DIAGNOSIS — Z01812 Encounter for preprocedural laboratory examination: Secondary | ICD-10-CM | POA: Diagnosis not present

## 2023-08-20 DIAGNOSIS — Z01810 Encounter for preprocedural cardiovascular examination: Secondary | ICD-10-CM | POA: Diagnosis not present

## 2023-08-20 DIAGNOSIS — C50922 Malignant neoplasm of unspecified site of left male breast: Secondary | ICD-10-CM | POA: Insufficient documentation

## 2023-08-20 DIAGNOSIS — I5022 Chronic systolic (congestive) heart failure: Secondary | ICD-10-CM | POA: Insufficient documentation

## 2023-08-20 DIAGNOSIS — Z01818 Encounter for other preprocedural examination: Secondary | ICD-10-CM | POA: Diagnosis not present

## 2023-08-20 DIAGNOSIS — Z79810 Long term (current) use of selective estrogen receptor modulators (SERMs): Secondary | ICD-10-CM | POA: Diagnosis not present

## 2023-08-20 LAB — CBC
HCT: 45.5 % (ref 39.0–52.0)
Hemoglobin: 15.4 g/dL (ref 13.0–17.0)
MCH: 32 pg (ref 26.0–34.0)
MCHC: 33.8 g/dL (ref 30.0–36.0)
MCV: 94.4 fL (ref 80.0–100.0)
Platelets: 228 10*3/uL (ref 150–400)
RBC: 4.82 MIL/uL (ref 4.22–5.81)
RDW: 13.1 % (ref 11.5–15.5)
WBC: 6.8 10*3/uL (ref 4.0–10.5)
nRBC: 0 % (ref 0.0–0.2)

## 2023-08-20 LAB — BASIC METABOLIC PANEL
Anion gap: 11 (ref 5–15)
BUN: 14 mg/dL (ref 6–20)
CO2: 25 mmol/L (ref 22–32)
Calcium: 9.3 mg/dL (ref 8.9–10.3)
Chloride: 101 mmol/L (ref 98–111)
Creatinine, Ser: 1.53 mg/dL — ABNORMAL HIGH (ref 0.61–1.24)
GFR, Estimated: 57 mL/min — ABNORMAL LOW (ref >60)
Glucose, Bld: 109 mg/dL — ABNORMAL HIGH (ref 70–99)
Potassium: 4.3 mmol/L (ref 3.5–5.1)
Sodium: 137 mmol/L (ref 135–145)

## 2023-08-20 NOTE — Anesthesia Preprocedure Evaluation (Signed)
Anesthesia Evaluation  Patient identified by MRN, date of birth, ID band Patient awake    Reviewed: Allergy & Precautions, NPO status , Patient's Chart, lab work & pertinent test results  Airway Mallampati: III  TM Distance: >3 FB Neck ROM: Full    Dental no notable dental hx. (+) Dental Advisory Given, Teeth Intact   Pulmonary pneumonia, Patient abstained from smoking., former smoker   Pulmonary exam normal breath sounds clear to auscultation       Cardiovascular + Past MI and +CHF   Rhythm:Regular Rate:Tachycardia  Echocardiogram 10/10/2022:  Normal LV systolic function with visual EF 50-55%. Left ventricle cavity  is normal in size. Mild concentric hypertrophy of the left ventricle.  Normal global wall motion. Normal diastolic filling pattern, normal LAP.  Calculated EF 52%.  Structurally normal tricuspid valve with trace regurgitation. No evidence  of pulmonary hypertension.  Compared to 11/2021, DD has now normalized.    Echocardiogram 11/21/2021:  Left ventricle cavity is normal in size. Moderate concentric hypertrophy  of the left ventricle. Normal global wall motion. Normal LV systolic  function with visual EF 50-55%. Doppler evidence of grade I (impaired)  diastolic dysfunction, normal LAP. No significant valvular abnormality.  Normal right atrial pressure.  Previous study on 08/27/2021 noted the following findings, which are now  absent: Dilated LV/RV with LVEF 20-25%. LV apical thrombus. Dilated RA/RV.  Mild MR.    Echo 08/2021  1. Left ventricular ejection fraction, by estimation, is 20 to 25%. The left ventricle has severely decreased function. The left ventricle demonstrates global hypokinesis. The left ventricular internal cavity size was dilated. Left ventricular diastolic parameters are consistent with Grade III diastolic dysfunction (restrictive). Elevated left ventricular end-diastolic pressure.   2. Right  ventricular systolic function is hyperdynamic. The right ventricular size is mildly enlarged dilated.   3. Left atrial size was mild to moderately dilated.   4. Right atrial size was dilated.   5. A small pericardial effusion is present. The pericardial effusion is posterior to the left ventricle.   6. The mitral valve is grossly normal. Mild mitral valve regurgitation. No evidence of mitral stenosis.   7. The aortic valve is tricuspid. Aortic valve regurgitation is not visualized. No aortic stenosis is present.   8. There is mild dilatation of the ascending aorta, measuring 40 mm.   9. The inferior vena cava is dilated in size with >50% respiratory variability, suggesting right atrial pressure of 8 mmHg.   Comparison(s): No prior Echocardiogram.    Neuro/Psych CVA  negative psych ROS   GI/Hepatic negative GI ROS,,,(+)     substance abuse  alcohol use  Endo/Other    Class 3 obesity  Renal/GU negative Renal ROS     Musculoskeletal negative musculoskeletal ROS (+)    Abdominal  (+) + obese  Peds  Hematology negative hematology ROS (+)   Anesthesia Other Findings   Reproductive/Obstetrics negative OB ROS                             Anesthesia Physical Anesthesia Plan  ASA: 3  Anesthesia Plan: General   Post-op Pain Management: Tylenol PO (pre-op)*   Induction: Intravenous  PONV Risk Score and Plan: 3 and Ondansetron, Droperidol, Treatment may vary due to age or medical condition, Midazolam and Dexamethasone  Airway Management Planned: Oral ETT  Additional Equipment: Arterial line and ClearSight  Intra-op Plan:   Post-operative Plan: Extubation in OR  Informed Consent:  I have reviewed the patients History and Physical, chart, labs and discussed the procedure including the risks, benefits and alternatives for the proposed anesthesia with the patient or authorized representative who has indicated his/her understanding and acceptance.      Dental advisory given  Plan Discussed with: CRNA  Anesthesia Plan Comments: (2 x PIV, remi gtt  PAT note written 08/20/2023 by Shonna Chock, PA-C.  )        Anesthesia Quick Evaluation

## 2023-08-20 NOTE — Progress Notes (Signed)
Anesthesia APP PAT Evaluation:  Case: [id number] Date/Time: 08/23/23 0715   Procedure: RADICAL NECK DISSECTION (Left)   Anesthesia type: General   Pre-op diagnosis: Cancer of head, face, or neck lymph nodes, secondary   [facility] ROOM 02 / [facility]   Surgeons: [name], MD       DISCUSSION: Patient is a 43 year old male scheduled for the above procedure.  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   [facility] admission 08/27/21 - 08/31/21 for prolonged URI symptoms for ~ 6 weeks. He had completed 2 courses of antibiotics but with persistent symptoms with dyspnea and tachycardia.  CTA was negative for PE but noted to have an incidental finding of a left breast mass concerning for malignancy. Echo showed severe LV dysfunction with EF 20-25%, global hypokinesis, grade 3 diastolic dysfunction, and findings concerning for LV thrombus or mass. 08/28/21 RHC/LHC showed normal coronaries, decompensated NICM  Cardiac MRI suggested likely acute myocarditis. He responded to IV diuresis and was started on GDMT. Anticoagulation therapy with warfarin initiated with heparin/Lovenox bridging. LifeVest arranged as well as out-patient cardiology and oncology follow-up. 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  Since then, he had follow-up echocardiograms 11/21/21 and 10/10/22 that showed improved LVEF to 50-55%. Anticoagulation therapy was discontinued after EF improvement. He underwent he underwent chemotherapy followed  by left modified radical mastectomy on 02/21/22. He completed radiation 05/16/22 and was started on Tamoxifen. Bone scan on 06/26/23 showed no osseous metastatic disease, but CT showed enlarged left supraclavicular lymph node concerning for nodal metastatic disease. There was also new tiny scatted non-specific pulmonary nodules with short term interval follow-up suggested. 08/01/23 neck CT showed a nearly 3 cm left subclavicular mass likely a nodal metastasis with extracapsular extension and multiple small but asymmetric and suspicious surrounding lymph nodes.  No other metastatic disease identified in the neck.  He was referred to ENT and evaluated by Ashok Croon, MD on 08/07/23. She not inclined to recommend surgery, but discussed potential for second opinion through [facility]. He was evaluated by Dr. Pollyann Kennedy on 08/15/23 who recommended modified left neck dissection.  I evaluated him at his 08/20/23 PAT visit given cardiac history. His last HF cardiology follow-up with Dr. Shirlee Latch was on 06/11/22, and last primary cardiology follow-up with Dr. Rosemary Holms was on 05/11/22. He currently has a routine follow-up visit with Dr. Shirlee Latch on 08/26/23, but says he will likely reschedule given surgery plans. He denied any chest pain, SOB, DOE, edema, palpitations, dizziness, syncope. His EKG showed ST at 101 bpm. Heart RRR, no murmur noted. Lungs somewhat diminished but clear. No significant LE edema. No conversational dyspnea. He denied routine exercise habits, but is not limited in ADLs and can go up a flight of stairs without CV symptoms.   Echo within the past year showed his LVEF maintaining at 50-55%. He remains on Entresto, eplerenone, Toprol, and statin therapy.  He does not have a PCP, but has continued to get refills through cardiology.   He denied CV/HF symptoms and denied activity limitations. If no acute changes then it is anticipated that he can proceed. Of  note, Creatinine did increase from 1.09 on 07/31/23 to  1.52 on 08/20/23. Potassium 4.2. He is on several medications that could affect his renal function including Verzenio, Entresto, and eplerenone. I did forward to Dr. Pamelia Hoit for future follow-up. Currently, he has a visit scheduled for 08/22/23. Also discussed with anesthesiologist Arrie Aran, MD.   Anesthesia team to evaluate on the day of surgery.  Mr. Mancinas was advised to contact Dr. Earmon Phoenix office regarding perioperative recommendations regarding Tamoxifen and Verzenio.  ADDENDUM 08/22/23 7:37 PM: Dr. Pamelia Hoit instructed him to hold Verzenio and resume a week after surgery. He met with Mr. Gaza this afternoon. Note reviewed. Labs were repeated. Creatinine was down to 1.30.    VS: BP (!) 139/95   Pulse (!) 101   Temp 36.9 C   Resp 19   Ht 6\' 3"  (1.905 m)   Wt (!) 148.3 kg   SpO2 97%   BMI 40.87 kg/m    PROVIDERS: Pcp, No Marca Ancona, MD is HF cardiologist Truett Mainland, MD is primary cardiologist Serena Croissant, MD is HEM-ONC Dorothy Puffer, MD is RAD-ONC   LABS: Preoperative labs noted. See DISCUSSION. (all labs ordered are listed, but only abnormal results are displayed)  Labs Reviewed  BASIC METABOLIC PANEL - Abnormal; Notable for the following components:      Result Value   Glucose, Bld 109 (*)    Creatinine, Ser 1.53 (*)    GFR, Estimated 57 (*)    All other components within normal limits  CBC    Labs as of 07/31/23 show Na 138, K 4.2, glucose 120, BUN 12, Cr 1.09, AST 31, ALT 28, WBC 11.0, H/H 15.8/47.5, PLT 245.   IMAGES: CT Soft tissue neck 08/01/23: IMPRESSION: 1. Relatively large, nearly 3 cm left supraclavicular fossa rounded and spiculated soft tissue mass is most likely a nodal metastasis with extra-capsular extension. Multiple smaller but asymmetric and suspicious surrounding lymph nodes there. All other cervical nodes remain within normal limits by imaging criteria. 2. No other metastatic disease identified in the Neck. 3. Small chronic left  occipital lobe brain infarct.   Bone Scan 06/26/23: IMPRESSION: No convincing scintigraphic evidence of osteoblastic osseous metastatic disease.   CT Chest/abd/pelvis 06/26/23: IMPRESSION: 1. Enlarged left supraclavicular lymph node measures 2.0 x 2.1 cm, this was outside of the field of view on most recent prior imaging and likely corresponds with an 8 mm short axis lymph nodes seen on CT September 29, 2021. Findings are concerning for nodal metastatic disease. 2. New tiny scattered nonspecific pulmonary nodules, suggest attention on short-term interval follow-up chest CT. 3. Soft tissue thickening in the left breast is favored postsurgical without focal enhancing nodularity to suggest local recurrence. 4. No evidence of metastatic disease within the abdomen or pelvis. 5. Geographic hepatic steatosis.  CT Orbits 06/13/23: IMPRESSION: 1. Moderate to severe Left Periorbital soft tissue swelling compatible with Preseptal Cellulitis. No postseptal involvement. 1.9 cm phlegmon area lateral to the orbit. But no soft tissue gas or drainable fluid. 2. Unrelated appearing paranasal sinus inflammation, stable to improved from last year.   MRI Brain 09/01/21: IMPRESSION: - Patchy acute and subacute infarcts as described. Largest area of involvement is in the left frontal lobe corresponding to abnormality on CT where there is some petechial hemorrhage. Involvement of multiple vascular territories suggesting a central source. - No intracranial mass.    EKG: EKG 08/20/23: ST at 101 bpm   CV: Echo 10/10/22: Normal LV systolic function with visual EF 50-55%. Left ventricle  cavity  is normal in size. Mild concentric hypertrophy of the left ventricle.  Normal global wall motion. Normal diastolic filling pattern, normal LAP.  Calculated EF 52%.  Structurally normal tricuspid valve with trace regurgitation. No evidence  of pulmonary hypertension.  Compared to 11/2021, DD has now normalized.  -  Comparison 11/21/21: LVEF 50-55%, moderate concentric LVH, grade 1 DD; 08/27/21: dilated LV/RV with LVEF 20-25%, LV apical thrombus, dilated RA/RV, mild MR   US Carotid 09/02/21: Summary:  - Right Carotid: The extracranial vessels were near-normal with only minimal wall thickening or plaque.  - Left Carotid: The extracranial vessels were near-normal with only minimal wall thickening or plaque.  - Vertebrals:  Bilateral vertebral arteries demonstrate antegrade flow.  - Subclavians: Normal flow hemodynamics were seen in bilateral subclavian arteries.    MRI Cardiac 08/29/21: IMPRESSION: 1.  Left breast mass as seen on prior CT.  Ongoing workup. 2. Severely dilated LV with mild concentric LV hypertrophy. EF 10% with global hypokinesis. 3. 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 The patient is on heparin gtt. 4.  Mildly dilated RV with EF 15%. 5. Delayed enhancement pattern as above. The inferolateral LGE could be a OM territory infarct, but corresponding lesion not seen by cath. Overall, most likely acute myocarditis given subepicardial apical inferior LGE also (the RV insertion site LGE can be nonspecific in the setting of volume/pressure overload). Elevated T2 in the apical inferior wall is more suggestive of acute myocarditis. 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 - Would consider CHF service/clinic followup given profound biventricular cardiomyopathy in young patient.   RHC/LHC 08/28/21: Normal coronary arteries without coronary artery disease   RA: 10 mmHg RV: 49/4 mmHg PA: 44/24 mmHg, mPAP 34 mmHg PCW: 22 mmHg   CO: 5.7 L/min CI: 2.3 L/min/m2   Decompensated nonischemic cardiomyopathy Resume IV heparin 2 hours after TR band is off given LV apical thrombus GDMT for heart failure and continued workup for breast mass  Past Medical History:  Diagnosis Date   Breast cancer (HCC)    CHF (congestive heart failure) (HCC)    Left ventricular thrombus 08/2021   in setting of NICM, BiV HF  likely from acute myocarditis, s/p anticoagulation ~ 3 months   Myocardial infarction [facility])    Myocarditis (HCC) 08/2021   Pneumonia    Stroke (HCC) 09/01/2021   in setting of LV thrombus    Past Surgical History:  Procedure Laterality Date   IR IMAGING GUIDED PORT INSERTION  09/28/2021   MASTECTOMY W/ SENTINEL NODE BIOPSY Left 02/21/2022   Procedure: LEFT SIMPLE MASTECTOMY WITH LEFT TARGETED LYMPH NODE BIOPSY;  Surgeon: Harriette Bouillon, MD;  Location: [facility] OR;  Service: General;  Laterality: Left;  GEN w/ PEC BLOCK   PORT-A-CATH REMOVAL Right 02/21/2022   Procedure: PORT REMOVAL;  Surgeon: Harriette Bouillon, MD;  Location: [facility] OR;  Service: General;  Laterality: Right;   RIGHT/LEFT HEART CATH AND CORONARY ANGIOGRAPHY N/A 08/28/2021   Procedure: RIGHT/LEFT HEART CATH AND CORONARY ANGIOGRAPHY;  Surgeon: Elder Negus, MD;  Location: [facility] INVASIVE CV LAB;  Service: Cardiovascular;  Laterality: N/A;    MEDICATIONS:  abemaciclib (VERZENIO) 100 MG tablet   acetaminophen (TYLENOL) 500 MG tablet   atorvastatin (LIPITOR) 20 MG tablet   eplerenone (INSPRA) 50 MG tablet   gabapentin (NEURONTIN) 100 MG capsule   metoprolol succinate (TOPROL-XL) 50 MG 24 hr tablet   sacubitril-valsartan (ENTRESTO) 97-103 MG   tamoxifen (NOLVADEX) 20 MG tablet   No current facility-administered medications for this encounter.  Shonna Chock, PA-C Surgical Short Stay/Anesthesiology [facility] Phone [phone] [facility] Phone [phone] 08/20/2023 5:56 PM

## 2023-08-20 NOTE — Progress Notes (Signed)
PCP - NO pcp Cardiologist - Laurey Morale, MD   PPM/ICD - denies Device Orders - n/a Rep Notified - n/a  Chest x-ray -  EKG - 08-20-23 Stress Test -  ECHO - 10-10-22 Cardiac Cath - 08-28-21  Sleep Study - denies DM -denies  Blood Thinner Instructions:denies Aspirin Instructions:n/a  ERAS Protcol -NPO PRE-SURGERY Ensure or G2-   COVID TEST- n/a   Anesthesia review: yes   Patient denies shortness of breath, fever, cough and chest pain at PAT appointment   All instructions explained to the patient, with a verbal understanding of the material. Patient agrees to go over the instructions while at home for a better understanding. Patient also instructed to self quarantine after being tested for COVID-19. The opportunity to ask questions was provided.

## 2023-08-21 ENCOUNTER — Telehealth: Payer: Self-pay | Admitting: *Deleted

## 2023-08-21 NOTE — Telephone Encounter (Signed)
-----   Message from Tamsen Meek sent at 08/21/2023  3:14 PM EST ----- Regarding: RE: Creatinine We will stop Verzinio and resume it 1 week after the surgery. Michael Stuart, can you please call him and have him stop Verzinio. Also encouraged him to drink more fluids. Thank you ----- Message ----- From: Jerold Coombe, PA-C Sent: 08/20/2023   5:11 PM EST To: Serena Croissant, MD Subject: Creatinine                                     Hi Dr. Pamelia Hoit, I am one of the physician assistants with [facility] Pre-Anesthesia Testing. Since you last saw Michael Stuart, he got a second opinion with ENT Dr. Pollyann Kennedy who has scheduled him for left radical neck dissection on 08/23/23.   Pre-op labs on 08/20/23 showed: Sodium: 137 Potassium: 4.3 Chloride: 101 CO2: 25 Glucose: 109 (H) BUN: 14 Creatinine: 1.53 (H) Calcium: 9.3 Anion gap: 11 GFR, Estimated: 57 (L)  WBC: 6.8 RBC: 4.82 Hemoglobin: 15.4 HCT: 45.5 MCV: 94.4 MCH: 32.0 MCHC: 33.8 RDW: 13.1 Platelets: 228 nRBC: 0.0  His Creatinine is up to 1.53, previously 1.09 on 07/31/23. It appears he is on several medications that could affect his renal function including eplerenone, Entresto, and Verzenio. No acute S/S of volume overload when I evaluated him.  I wanted to pass along more for follow-up purposes---it looks like he is actually scheduled at [facility] on 08/22/23 the day before his surgery.  (He also has an appointment with Dr. Marca Ancona on 08/26/23, but reported he would likely reschedule this due to his surgery).  We instructed him to contact your office regarding if there were perioperative medication instructions regarding Verzenio and tamoxifen.   Thanks, Shonna Chock, PA-C Surgical Short Stay/Anesthesiology [facility] Phone [phone] [employer] Phone [phone] 08/20/2023 5:09 PM

## 2023-08-21 NOTE — Telephone Encounter (Signed)
RN placed call to pt with below information. Pt educated and verbalized understanding.

## 2023-08-21 NOTE — H&P (Signed)
HPI:   Michael Stuart is a 43 y.o. male who presents as a consult Patient.   Referring Provider: No ref. provider found  Chief complaint: Metastatic breast cancer.  HPI: History of breast cancer treated with chemo-surgery-radiation about 2 years ago. He recently developed a left lower neck mass. He has had extensive workup and there is no other detectable cancer anywhere else. He is on systemic therapy. He is referred here by medical oncology for salvage neck surgery.  PMH/Meds/All/SocHx/FamHx/ROS:   Past Medical History:  Diagnosis Date  Cancer (CMD)  High blood pressure   History reviewed. No pertinent surgical history.  No family history of bleeding disorders, wound healing problems or difficulty with anesthesia.     Current Outpatient Medications:  abemaciclib (VERZENIO) 100 mg chemo tablet, Take 100 mg by mouth., Disp: , Rfl:  acetaminophen (TYLENOL) 500 mg tablet, Take 1,500 mg by mouth., Disp: , Rfl:  atorvastatin (LIPITOR) 20 mg tablet, Take 1 tablet by mouth daily., Disp: , Rfl:  dextroamphetamine-amphetamine (ADDERALL) 20 mg tablet, dextroamphetamine-amphetamine 20 mg tablet, Disp: , Rfl:  eplerenone (INSPRA) 50 mg tablet, Take 1 tablet by mouth daily., Disp: , Rfl:  gabapentin (NEURONTIN) 100 mg capsule, Take 100 mg by mouth., Disp: , Rfl:  metoprolol succinate (TOPROL XL) 50 mg 24 hr tablet, Take 50 mg by mouth daily., Disp: , Rfl:  sacubitriL-valsartan (Entresto) 97-103 mg per tablet, Take 1 tablet by mouth 2 (two) times a day., Disp: , Rfl:  tamoxifen (NOLVADEX) 20 mg tablet, Take 20 mg by mouth daily., Disp: , Rfl:   A complete ROS was performed with pertinent positives/negatives noted in the HPI. The remainder of the ROS are negative.   Physical Exam:   Temp 95.7 F (35.4 C) (Temporal)  Wt 98.4 kg (217 lb)  BMI 27.12 kg/m   General: Healthy and alert, in no distress, breathing easily. Normal affect. In a pleasant mood. Head: Normocephalic, atraumatic. No  masses, or scars. Eyes: Pupils are equal, and reactive to light. Vision is grossly intact. No spontaneous or gaze nystagmus. Ears: Ear canals are clear. Tympanic membranes are intact, with normal landmarks and the middle ears are clear and healthy. Hearing: Grossly normal. Nose: Nasal cavities are clear with healthy mucosa, no polyps or exudate. Airways are patent. Face: No masses or scars, facial nerve function is symmetric. Oral Cavity: No mucosal abnormalities are noted. Tongue with normal mobility. Dentition appears healthy. Oropharynx: Tonsils are symmetric. There are no mucosal masses identified. Tongue base appears normal and healthy. Larynx/Hypopharynx: deferred Chest: Deferred Neck: 5 cm left supraclavicular mass, otherwise no palpable masses, no cervical adenopathy, no thyroid nodules or enlargement. Neuro: Cranial nerves II-XII with normal function. Balance: Normal gate. Other findings: none.  Independent Review of Additional Tests or Records:  CT neck and PET scans reviewed. FNA of left neck mass positive for metastatic breast cancer.  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 Recommend modified neck dissection. We discussed at length the risks including the spinal accessory nerve, the thoracic duct, possibility of pneumothorax. He understands all of this and is agreeable to surgery. Will schedule at his convenience.

## 2023-08-22 ENCOUNTER — Inpatient Hospital Stay: Payer: BC Managed Care – PPO

## 2023-08-22 ENCOUNTER — Inpatient Hospital Stay: Payer: BC Managed Care – PPO | Admitting: Hematology and Oncology

## 2023-08-22 VITALS — BP 123/85 | HR 124 | Temp 98.5°F | Resp 17 | Wt 323.2 lb

## 2023-08-22 DIAGNOSIS — Z170 Estrogen receptor positive status [ER+]: Secondary | ICD-10-CM

## 2023-08-22 DIAGNOSIS — Z9013 Acquired absence of bilateral breasts and nipples: Secondary | ICD-10-CM | POA: Diagnosis not present

## 2023-08-22 DIAGNOSIS — Z1732 Human epidermal growth factor receptor 2 negative status: Secondary | ICD-10-CM | POA: Diagnosis not present

## 2023-08-22 DIAGNOSIS — C50422 Malignant neoplasm of upper-outer quadrant of left male breast: Secondary | ICD-10-CM | POA: Diagnosis not present

## 2023-08-22 DIAGNOSIS — Z79899 Other long term (current) drug therapy: Secondary | ICD-10-CM | POA: Diagnosis not present

## 2023-08-22 DIAGNOSIS — Z79810 Long term (current) use of selective estrogen receptor modulators (SERMs): Secondary | ICD-10-CM | POA: Diagnosis not present

## 2023-08-22 DIAGNOSIS — C770 Secondary and unspecified malignant neoplasm of lymph nodes of head, face and neck: Secondary | ICD-10-CM | POA: Diagnosis not present

## 2023-08-22 DIAGNOSIS — Z1721 Progesterone receptor positive status: Secondary | ICD-10-CM | POA: Diagnosis not present

## 2023-08-22 LAB — CBC WITH DIFFERENTIAL (CANCER CENTER ONLY)
Abs Immature Granulocytes: 0.01 10*3/uL (ref 0.00–0.07)
Basophils Absolute: 0 10*3/uL (ref 0.0–0.1)
Basophils Relative: 1 %
Eosinophils Absolute: 0.3 10*3/uL (ref 0.0–0.5)
Eosinophils Relative: 4 %
HCT: 43.9 % (ref 39.0–52.0)
Hemoglobin: 14.9 g/dL (ref 13.0–17.0)
Immature Granulocytes: 0 %
Lymphocytes Relative: 36 %
Lymphs Abs: 2.2 10*3/uL (ref 0.7–4.0)
MCH: 32.1 pg (ref 26.0–34.0)
MCHC: 33.9 g/dL (ref 30.0–36.0)
MCV: 94.6 fL (ref 80.0–100.0)
Monocytes Absolute: 0.5 10*3/uL (ref 0.1–1.0)
Monocytes Relative: 8 %
Neutro Abs: 3.1 10*3/uL (ref 1.7–7.7)
Neutrophils Relative %: 51 %
Platelet Count: 207 10*3/uL (ref 150–400)
RBC: 4.64 MIL/uL (ref 4.22–5.81)
RDW: 13.2 % (ref 11.5–15.5)
WBC Count: 6.1 10*3/uL (ref 4.0–10.5)
nRBC: 0 % (ref 0.0–0.2)

## 2023-08-22 LAB — CMP (CANCER CENTER ONLY)
ALT: 34 U/L (ref 0–44)
AST: 32 U/L (ref 15–41)
Albumin: 4.2 g/dL (ref 3.5–5.0)
Alkaline Phosphatase: 56 U/L (ref 38–126)
Anion gap: 4 — ABNORMAL LOW (ref 5–15)
BUN: 15 mg/dL (ref 6–20)
CO2: 29 mmol/L (ref 22–32)
Calcium: 9.4 mg/dL (ref 8.9–10.3)
Chloride: 106 mmol/L (ref 98–111)
Creatinine: 1.3 mg/dL — ABNORMAL HIGH (ref 0.61–1.24)
GFR, Estimated: >60 mL/min (ref >60)
Glucose, Bld: 121 mg/dL — ABNORMAL HIGH (ref 70–99)
Potassium: 4.3 mmol/L (ref 3.5–5.1)
Sodium: 139 mmol/L (ref 135–145)
Total Bilirubin: 0.5 mg/dL (ref 0.0–1.2)
Total Protein: 7.2 g/dL (ref 6.5–8.1)

## 2023-08-22 MED ORDER — ABEMACICLIB 150 MG PO TABS: 150.0000 mg | ORAL_TABLET | Freq: Two times a day (BID) | ORAL | 3 refills | Status: DC

## 2023-08-22 NOTE — Assessment & Plan Note (Signed)
09/15/2021:Palpable left breast mass x8 months. CT 08/27/2021 in ED showed 3.8 cm left breast mass with left axillary lymph nodes.Mamm and [place]: 4 cm mass with 5 axillary lymph nodes: Biopsy: Grade 2 IDC, lymph node: Positive, ER 100%, PR 50%, Ki-67 25%, HER2 negative ratio 1.25, copy #3.45 (Recent history of coronary artery disease EF 22% and stroke)   Treatment plan: 1. Neoadjuvant chemotherapy with Taxotere and Cytoxan every 3 weeks x6 cycles completed 01/16/2022 2. left mastectomy: Residual grade 2 IDC 3.5 cm, margins negative, 2/2 lymph nodes positive, angiolymphatic invasion present, ER 100%, PR 50%, HER2 negative, Ki-67 25% 3. Followed by adjuvant radiation therapy 04/03/22-05/16/22 4.  Followed by adjuvant antiestrogen therapy with tamoxifen started November 2023 5. Signatera test positive (0.3)  06/26/2023: Bone scan: Negative, CT CAP: Enlarged left supraclavicular lymph node 2.1 cm, (used to be 0.8 cm) new tiny scattered nonspecific lung nodules (4 mm and 3 mm) -------------------------------------------------------------------------------------------------------------------- 07/16/2023: Biopsy left supraclavicular lymph node: Metastatic poorly differentiated carcinoma consistent with breast ER 75%, PR 75%, Ki67 40%, HER2 1+  Current treatment: Tamoxifen with Verzenio (Verzinio will be on hold until surgery to remove the lymph node) Verzinio toxicities:  Renal insufficiency: Possibly related to [employer].  Encouraged him to increase his fluid intake.

## 2023-08-22 NOTE — Progress Notes (Signed)
Patient Care Team: Pcp, No as PCP - General Pershing Proud, RN as Oncology Nurse Navigator Donnelly Angelica, RN as Oncology Nurse Navigator Elwyn Reach (Neurology) Serena Croissant, MD as Medical Oncologist (Hematology and Oncology)  DIAGNOSIS:  Encounter Diagnosis  Name Primary?   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 CT 08/27/2021 in ED showed 3.8 centimeter left breast mass with left axillary lymph nodes.Mamm and [place]: 4 cm mass with 5 axillary lymph nodes: Biopsy: Grade 2 IDC, lymph node: Positive, ER 100%, PR 50%, Ki-67 25%, HER2 negative ratio 1.25, copy #3.45   09/25/2021 Cancer Staging   Staging form: Breast, AJCC 8th Edition - Clinical: Stage IIA (cT2, cN1, cM0, G2, ER+, PR+, HER2-) - Signed by Serena Croissant, MD on 09/25/2021 Histologic grading system: 3 grade system   10/03/2021 - 01/18/2022 Chemotherapy   Patient is on Treatment Plan : BREAST TC q21d     01/22/2022 Genetic Testing   Negative genetic testing on the CancerNext-Expanded+RNAinsight.  The report date is January 22, 2022.  The CancerNext-Expanded gene panel offered by [facility] and includes sequencing and rearrangement analysis for the following 77 genes: AIP, ALK, APC*, ATM*, AXIN2, BAP1, BARD1, BLM, BMPR1A, BRCA1*, BRCA2*, BRIP1*, CDC73, CDH1*, CDK4, CDKN1B, CDKN2A, CHEK2*, CTNNA1, DICER1, FANCC, FH, FLCN, GALNT12, KIF1B, LZTR1, MAX, MEN1, MET, MLH1*, MSH2*, MSH3, MSH6*, MUTYH*, NBN, NF1*, NF2, NTHL1, PALB2*, PHOX2B, PMS2*, POT1, PRKAR1A, PTCH1, PTEN*, RAD51C*, RAD51D*, RB1, RECQL, RET, SDHA, SDHAF2, SDHB, SDHC, SDHD, SMAD4, SMARCA4, SMARCB1, SMARCE1, STK11, SUFU, TMEM127, TP53*, TSC1, TSC2, VHL and XRCC2 (sequencing and  deletion/duplication); EGFR, EGLN1, HOXB13, KIT, MITF, PDGFRA, POLD1, and POLE (sequencing only); EPCAM and GREM1 (deletion/duplication only). DNA and RNA analyses performed for * genes.      CHIEF COMPLIANT: Follow-up of metastatic breast cancer on Verzenio  HISTORY OF PRESENT ILLNESS:  History of Present Illness   Michael Stuart is a 43 year old male with breast cancer who presents for pre-surgical evaluation and medication management.  A recent scan indicated growth in a tumor, which has increased from 2.1 cm to 3 cm over a period of one month and one week. This growth was noted in a scan performed three weeks ago.  He experiences queasiness and fatigue as side effects of Verzenio, but no diarrhea.  His creatinine level recently increased from normal to 1.5 mg/dL as of August 20, 2023, which is a new development as he previously had no issues with kidney function. He acknowledges that he may not be drinking enough water, which could be contributing to this issue.  He is also taking tamoxifen and gabapentin, and confirms that he has an adequate supply of both medications.         ALLERGIES:  is allergic to bactrim [sulfamethoxazole-trimethoprim] and sulfacetamide sodium.  MEDICATIONS:  Current Outpatient Medications  Medication Sig Dispense Refill   [START ON 08/29/2023] abemaciclib (VERZENIO) 150 MG tablet Take 1 tablet (150 mg total) by mouth 2 (two) times daily. 60 tablet 3   acetaminophen (TYLENOL) 500 MG tablet Take 1,500 mg by mouth daily as needed for moderate pain or headache.     atorvastatin (LIPITOR) 20 MG tablet TAKE 1 TABLET BY MOUTH EVERY DAY 90 tablet 3   eplerenone (INSPRA)  50 MG tablet Take 1 tablet by mouth daily. 90 tablet 3   gabapentin (NEURONTIN) 100 MG capsule Take 1 capsule (100 mg total) by mouth 3 (three) times daily. 90 capsule 3   metoprolol succinate (TOPROL-XL) 50 MG 24 hr tablet Take 1 tablet (50 mg total) by mouth daily. 90 tablet 3    sacubitril-valsartan (ENTRESTO) 97-103 MG Take 1 tablet by mouth 2 (two) times daily. 60 tablet 11   tamoxifen (NOLVADEX) 20 MG tablet TAKE 1 TABLET(20 MG) BY MOUTH DAILY 90 tablet 3   No current facility-administered medications for this visit.    PHYSICAL EXAMINATION: ECOG PERFORMANCE STATUS: 1 - Symptomatic but completely ambulatory  Vitals:   08/22/23 1510 08/22/23 1511  BP: (!) 134/97 123/85  Pulse: (!) 124   Resp: 17   Temp: 98.5 F (36.9 C)   SpO2: 97%    Filed Weights   08/22/23 1510  Weight: (!) 323 lb 3.2 oz (146.6 kg)    Physical Exam          (exam performed in the presence of a chaperone)  LABORATORY DATA:  I have reviewed the data as listed    Latest Ref Rng & Units 08/20/2023    1:42 PM 07/31/2023   12:26 PM 06/14/2023    6:02 AM  CMP  Glucose 70 - 99 mg/dL 191  478  295   BUN 6 - 20 mg/dL 14  12  10    Creatinine 0.61 - 1.24 mg/dL 6.21  3.08  6.57   Sodium 135 - 145 mmol/L 137  138  136   Potassium 3.5 - 5.1 mmol/L 4.3  4.2  3.9   Chloride 98 - 111 mmol/L 101  104  104   CO2 22 - 32 mmol/L 25  26  24    Calcium 8.9 - 10.3 mg/dL 9.3  9.5  8.5   Total Protein 6.5 - 8.1 g/dL  7.4    Total Bilirubin 0.0 - 1.2 mg/dL  0.7    Alkaline Phos 38 - 126 U/L  66    AST 15 - 41 U/L  31    ALT 0 - 44 U/L  28      Lab Results  Component Value Date   WBC 6.1 08/22/2023   HGB 14.9 08/22/2023   HCT 43.9 08/22/2023   MCV 94.6 08/22/2023   PLT 207 08/22/2023   NEUTROABS 3.1 08/22/2023    ASSESSMENT & PLAN:  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 CT 08/27/2021 in ED showed 3.8 cm left breast mass with left axillary lymph nodes.Mamm and [place]: 4 cm mass with 5 axillary lymph nodes: Biopsy: Grade 2 IDC, lymph node: Positive, ER 100%, PR 50%, Ki-67 25%, HER2 negative ratio 1.25, copy #3.45 (Recent history of coronary artery disease EF 22% and stroke)   Treatment  plan: 1. Neoadjuvant chemotherapy with Taxotere and Cytoxan every 3 weeks x6 cycles completed 01/16/2022 2. left mastectomy: Residual grade 2 IDC 3.5 cm, margins negative, 2/2 lymph nodes positive, angiolymphatic invasion present, ER 100%, PR 50%, HER2 negative, Ki-67 25% 3. Followed by adjuvant radiation therapy 04/03/22-05/16/22 4.  Followed by adjuvant antiestrogen therapy with tamoxifen started November 2023 5. Signatera test positive (0.3)  06/26/2023: Bone scan: Negative, CT CAP: Enlarged left supraclavicular lymph node 2.1 cm, (used to be 0.8 cm) new tiny scattered nonspecific lung nodules (4 mm and 3 mm) -------------------------------------------------------------------------------------------------------------------- 07/16/2023: Biopsy left supraclavicular lymph node:  Metastatic poorly differentiated carcinoma consistent with breast ER 75%, PR 75%, Ki67 40%, HER2 1+  Current treatment: Tamoxifen with Verzenio (Verzinio will be on hold until surgery to remove the lymph node) Verzinio toxicities: Mild fatigue.  Denies any diarrhea I sent a new prescription to increase the dosage of Verzinio to 150 p.o. twice daily starting in a week.  Renal insufficiency: Encouraged him to increase his fluid intake. Patient is undergoing surgery 08/23/2023 to remove the supraclavicular lymph node. Radiation cannot be done because of prior radiation.  I will see him after surgery to discuss pathology report.    No orders of the defined types were placed in this encounter.  The patient has a good understanding of the overall plan. he agrees with it. he will call with any problems that may develop before the next visit here. Total time spent: 30 mins including face to face time and time spent for planning, charting and co-ordination of care   Tamsen Meek, MD 08/22/23

## 2023-08-23 ENCOUNTER — Ambulatory Visit (HOSPITAL_COMMUNITY): Payer: Self-pay | Admitting: Anesthesiology

## 2023-08-23 ENCOUNTER — Other Ambulatory Visit: Payer: Self-pay

## 2023-08-23 ENCOUNTER — Ambulatory Visit (HOSPITAL_COMMUNITY): Payer: Self-pay | Admitting: Vascular Surgery

## 2023-08-23 ENCOUNTER — Encounter (HOSPITAL_COMMUNITY): Payer: Self-pay | Admitting: Otolaryngology

## 2023-08-23 ENCOUNTER — Encounter (HOSPITAL_COMMUNITY)
Admission: RE | Disposition: A | Discharge status: Home / Self Care | Payer: Self-pay | Source: Home / Self Care | Attending: Otolaryngology

## 2023-08-23 ENCOUNTER — Observation Stay (HOSPITAL_COMMUNITY)
Admission: RE | Admit: 2023-08-23 | Discharge: 2023-08-24 | Disposition: A | Discharge status: Home / Self Care | Payer: BC Managed Care – PPO | Attending: Otolaryngology | Admitting: Otolaryngology

## 2023-08-23 DIAGNOSIS — C770 Secondary and unspecified malignant neoplasm of lymph nodes of head, face and neck: Principal | ICD-10-CM | POA: Insufficient documentation

## 2023-08-23 DIAGNOSIS — I5043 Acute on chronic combined systolic (congestive) and diastolic (congestive) heart failure: Secondary | ICD-10-CM | POA: Diagnosis not present

## 2023-08-23 DIAGNOSIS — C50922 Malignant neoplasm of unspecified site of left male breast: Secondary | ICD-10-CM | POA: Insufficient documentation

## 2023-08-23 DIAGNOSIS — Z79899 Other long term (current) drug therapy: Secondary | ICD-10-CM | POA: Diagnosis not present

## 2023-08-23 DIAGNOSIS — R221 Localized swelling, mass and lump, neck: Secondary | ICD-10-CM | POA: Diagnosis not present

## 2023-08-23 DIAGNOSIS — C50422 Malignant neoplasm of upper-outer quadrant of left male breast: Secondary | ICD-10-CM | POA: Diagnosis not present

## 2023-08-23 DIAGNOSIS — C760 Malignant neoplasm of head, face and neck: Secondary | ICD-10-CM | POA: Diagnosis not present

## 2023-08-23 DIAGNOSIS — I10 Essential (primary) hypertension: Secondary | ICD-10-CM | POA: Insufficient documentation

## 2023-08-23 DIAGNOSIS — C7989 Secondary malignant neoplasm of other specified sites: Secondary | ICD-10-CM | POA: Diagnosis not present

## 2023-08-23 DIAGNOSIS — C50929 Malignant neoplasm of unspecified site of unspecified male breast: Secondary | ICD-10-CM | POA: Diagnosis not present

## 2023-08-23 HISTORY — PX: RADICAL NECK DISSECTION: SHX2284

## 2023-08-23 SURGERY — DISSECTION, NECK, RADICAL
Anesthesia: General | Site: Neck | Laterality: Left

## 2023-08-23 MED ORDER — DEXAMETHASONE SODIUM PHOSPHATE 10 MG/ML IJ SOLN
INTRAMUSCULAR | Status: AC
  Filled 2023-08-23: qty 2

## 2023-08-23 MED ORDER — PROPOFOL 10 MG/ML IV BOLUS
INTRAVENOUS | Status: DC | PRN
  Administered 2023-08-23: 200 mg via INTRAVENOUS

## 2023-08-23 MED ORDER — MIDAZOLAM HCL 2 MG/2ML IJ SOLN
INTRAMUSCULAR | Status: AC
  Filled 2023-08-23: qty 2

## 2023-08-23 MED ORDER — PROPOFOL 10 MG/ML IV BOLUS
INTRAVENOUS | Status: AC
  Filled 2023-08-23: qty 20

## 2023-08-23 MED ORDER — BACITRACIN ZINC 500 UNIT/GM EX OINT
TOPICAL_OINTMENT | CUTANEOUS | Status: AC
  Filled 2023-08-23: qty 28.35

## 2023-08-23 MED ORDER — SODIUM CHLORIDE 0.9 % IV SOLN
0.0125 ug/kg/min | INTRAVENOUS | Status: DC
  Filled 2023-08-23: qty 2000

## 2023-08-23 MED ORDER — LABETALOL HCL 5 MG/ML IV SOLN
10.0000 mg | Freq: Once | INTRAVENOUS | Status: AC
  Administered 2023-08-23: 10 mg via INTRAVENOUS

## 2023-08-23 MED ORDER — ONDANSETRON HCL 4 MG/2ML IJ SOLN
INTRAMUSCULAR | Status: DC | PRN
  Administered 2023-08-23: 4 mg via INTRAVENOUS

## 2023-08-23 MED ORDER — DEXAMETHASONE SODIUM PHOSPHATE 10 MG/ML IJ SOLN
INTRAMUSCULAR | Status: DC | PRN
  Administered 2023-08-23: 12 mg via INTRAVENOUS

## 2023-08-23 MED ORDER — FENTANYL CITRATE (PF) 250 MCG/5ML IJ SOLN
INTRAMUSCULAR | Status: AC
  Filled 2023-08-23: qty 5

## 2023-08-23 MED ORDER — 0.9 % SODIUM CHLORIDE (POUR BTL) OPTIME
TOPICAL | Status: DC | PRN
  Administered 2023-08-23: 1000 mL

## 2023-08-23 MED ORDER — ACETAMINOPHEN 500 MG PO TABS
1000.0000 mg | ORAL_TABLET | Freq: Once | ORAL | Status: AC
  Administered 2023-08-23: 1000 mg via ORAL
  Filled 2023-08-23: qty 2

## 2023-08-23 MED ORDER — ORAL CARE MOUTH RINSE: 15.0000 mL | Freq: Once | OROMUCOSAL | Status: AC

## 2023-08-23 MED ORDER — LIDOCAINE 2% (20 MG/ML) 5 ML SYRINGE
INTRAMUSCULAR | Status: DC | PRN
  Administered 2023-08-23: 100 mg via INTRAVENOUS

## 2023-08-23 MED ORDER — METOPROLOL SUCCINATE ER 25 MG PO TB24
50.0000 mg | ORAL_TABLET | Freq: Once | ORAL | Status: AC
  Administered 2023-08-23: 50 mg via ORAL
  Filled 2023-08-23: qty 2

## 2023-08-23 MED ORDER — METOPROLOL SUCCINATE ER 25 MG PO TB24
50.0000 mg | ORAL_TABLET | Freq: Every day | ORAL | Status: DC
  Administered 2023-08-24: 50 mg via ORAL
  Filled 2023-08-23: qty 2

## 2023-08-23 MED ORDER — ROCURONIUM BROMIDE 10 MG/ML (PF) SYRINGE
PREFILLED_SYRINGE | INTRAVENOUS | Status: AC
  Filled 2023-08-23: qty 10

## 2023-08-23 MED ORDER — ABEMACICLIB 150 MG PO TABS: 150.0000 mg | ORAL_TABLET | Freq: Two times a day (BID) | ORAL | Status: DC

## 2023-08-23 MED ORDER — LIDOCAINE 2% (20 MG/ML) 5 ML SYRINGE
INTRAMUSCULAR | Status: AC
  Filled 2023-08-23: qty 5

## 2023-08-23 MED ORDER — LABETALOL HCL 5 MG/ML IV SOLN
INTRAVENOUS | Status: AC
Start: 2023-08-23 — End: 2023-08-23
  Filled 2023-08-23: qty 4

## 2023-08-23 MED ORDER — ATORVASTATIN CALCIUM 10 MG PO TABS
20.0000 mg | ORAL_TABLET | Freq: Every day | ORAL | Status: DC
  Administered 2023-08-23 – 2023-08-24 (×2): 20 mg via ORAL
  Filled 2023-08-23 (×2): qty 2

## 2023-08-23 MED ORDER — SACUBITRIL-VALSARTAN 97-103 MG PO TABS
1.0000 | ORAL_TABLET | Freq: Two times a day (BID) | ORAL | Status: DC
  Administered 2023-08-23 – 2023-08-24 (×3): 1 via ORAL
  Filled 2023-08-23 (×3): qty 1

## 2023-08-23 MED ORDER — HYDROMORPHONE HCL 1 MG/ML IJ SOLN
INTRAMUSCULAR | Status: AC
  Filled 2023-08-23: qty 1

## 2023-08-23 MED ORDER — CHLORHEXIDINE GLUCONATE 0.12 % MT SOLN
15.0000 mL | Freq: Once | OROMUCOSAL | Status: AC
  Administered 2023-08-23: 15 mL via OROMUCOSAL
  Filled 2023-08-23: qty 15

## 2023-08-23 MED ORDER — DROPERIDOL 2.5 MG/ML IJ SOLN: 0.6250 mg | Freq: Once | INTRAMUSCULAR | Status: DC | PRN

## 2023-08-23 MED ORDER — DEXMEDETOMIDINE HCL IN NACL 80 MCG/20ML IV SOLN
INTRAVENOUS | Status: DC | PRN
  Administered 2023-08-23: 8 ug via INTRAVENOUS
  Administered 2023-08-23: 4 ug via INTRAVENOUS

## 2023-08-23 MED ORDER — ONDANSETRON HCL 4 MG/2ML IJ SOLN
INTRAMUSCULAR | Status: AC
Start: 2023-08-23 — End: ?
  Filled 2023-08-23: qty 2

## 2023-08-23 MED ORDER — LACTATED RINGERS IV SOLN
INTRAVENOUS | Status: DC | PRN
Start: 2023-08-23 — End: 2023-08-23

## 2023-08-23 MED ORDER — LACTATED RINGERS IV SOLN: INTRAVENOUS | Status: DC | PRN

## 2023-08-23 MED ORDER — HYDROMORPHONE HCL 1 MG/ML IJ SOLN
0.2500 mg | INTRAMUSCULAR | Status: DC | PRN
  Administered 2023-08-23: 0.5 mg via INTRAVENOUS

## 2023-08-23 MED ORDER — FENTANYL CITRATE (PF) 250 MCG/5ML IJ SOLN
INTRAMUSCULAR | Status: DC | PRN
  Administered 2023-08-23 (×5): 50 ug via INTRAVENOUS

## 2023-08-23 MED ORDER — SPIRONOLACTONE 25 MG PO TABS
25.0000 mg | ORAL_TABLET | Freq: Every day | ORAL | Status: DC
  Administered 2023-08-23 – 2023-08-24 (×2): 25 mg via ORAL
  Filled 2023-08-23 (×2): qty 1

## 2023-08-23 MED ORDER — SODIUM CHLORIDE 0.9 % IV SOLN
0.0125 ug/kg/min | INTRAVENOUS | Status: DC
  Administered 2023-08-23: .05 ug/kg/min via INTRAVENOUS
  Filled 2023-08-23 (×2): qty 2000

## 2023-08-23 MED ORDER — SUGAMMADEX SODIUM 200 MG/2ML IV SOLN
INTRAVENOUS | Status: DC | PRN
  Administered 2023-08-23: 200 mg via INTRAVENOUS

## 2023-08-23 MED ORDER — BACITRACIN ZINC 500 UNIT/GM EX OINT
TOPICAL_OINTMENT | CUTANEOUS | Status: DC | PRN
  Administered 2023-08-23: 1 via TOPICAL

## 2023-08-23 MED ORDER — TAMOXIFEN CITRATE 10 MG PO TABS
20.0000 mg | ORAL_TABLET | Freq: Every day | ORAL | Status: DC
  Administered 2023-08-23 – 2023-08-24 (×2): 20 mg via ORAL
  Filled 2023-08-23 (×2): qty 2

## 2023-08-23 MED ORDER — DEXTROSE-SODIUM CHLORIDE 5-0.9 % IV SOLN: INTRAVENOUS | Status: AC

## 2023-08-23 MED ORDER — HYDROCODONE-ACETAMINOPHEN 5-325 MG PO TABS
1.0000 | ORAL_TABLET | ORAL | Status: DC | PRN
  Administered 2023-08-23: 1 via ORAL
  Administered 2023-08-23 – 2023-08-24 (×2): 2 via ORAL
  Filled 2023-08-23 (×3): qty 2

## 2023-08-23 MED ORDER — GABAPENTIN 100 MG PO CAPS
100.0000 mg | ORAL_CAPSULE | Freq: Three times a day (TID) | ORAL | Status: DC
  Administered 2023-08-23 – 2023-08-24 (×3): 100 mg via ORAL
  Filled 2023-08-23 (×3): qty 1

## 2023-08-23 MED ORDER — MIDAZOLAM HCL 2 MG/2ML IJ SOLN
INTRAMUSCULAR | Status: DC | PRN
  Administered 2023-08-23: 2 mg via INTRAVENOUS

## 2023-08-23 MED ORDER — ROCURONIUM BROMIDE 10 MG/ML (PF) SYRINGE
PREFILLED_SYRINGE | INTRAVENOUS | Status: DC | PRN
  Administered 2023-08-23: 20 mg via INTRAVENOUS
  Administered 2023-08-23: 80 mg via INTRAVENOUS
  Administered 2023-08-23 (×2): 20 mg via INTRAVENOUS
  Administered 2023-08-23: 10 mg via INTRAVENOUS

## 2023-08-23 MED ORDER — IBUPROFEN 100 MG/5ML PO SUSP: 400.0000 mg | Freq: Four times a day (QID) | ORAL | Status: DC | PRN

## 2023-08-23 SURGICAL SUPPLY — 49 items
APPLIER CLIP 9.375 SM OPEN (CLIP)
BAG COUNTER SPONGE SURGICOUNT (BAG) ×1 IMPLANT
CANISTER SUCT 3000ML PPV (MISCELLANEOUS) ×1 IMPLANT
CLEANER TIP ELECTROSURG 2X2 (MISCELLANEOUS) ×1 IMPLANT
CLIP APPLIE 9.375 SM OPEN (CLIP) IMPLANT
CNTNR URN SCR LID CUP LEK RST (MISCELLANEOUS) IMPLANT
CORD BIPOLAR FORCEPS 12FT (ELECTRODE) ×1 IMPLANT
COVER SURGICAL LIGHT HANDLE (MISCELLANEOUS) ×1 IMPLANT
DRAIN CHANNEL 15F RND FF W/TCR (WOUND CARE) IMPLANT
DRAIN JP 10F RND RADIO (DRAIN) IMPLANT
DRAIN JP 15F RND RADIO PRF (DRAIN) IMPLANT
DRAPE HALF SHEET 40X57 (DRAPES) IMPLANT
DRAPE INCISE 23X17 STRL (DRAPES) IMPLANT
DRAPE INCISE IOBAN 23X17 STRL (DRAPES)
ELECT COATED BLADE 2.86 ST (ELECTRODE) ×1 IMPLANT
ELECT REM PT RETURN 9FT ADLT (ELECTROSURGICAL) ×1
ELECTRODE REM PT RTRN 9FT ADLT (ELECTROSURGICAL) ×1 IMPLANT
EVACUATOR SILICONE 100CC (DRAIN) ×1 IMPLANT
FORCEPS BIPOLAR SPETZLER 8 1.0 (NEUROSURGERY SUPPLIES) ×1 IMPLANT
GAUZE 4X4 16PLY ~~LOC~~+RFID DBL (SPONGE) ×1 IMPLANT
GLOVE BIO SURGEON STRL SZ 6.5 (GLOVE) ×1 IMPLANT
GLOVE ECLIPSE 7.5 STRL STRAW (GLOVE) ×1 IMPLANT
GOWN STRL REUS W/ TWL LRG LVL3 (GOWN DISPOSABLE) ×2 IMPLANT
KIT BASIN OR (CUSTOM PROCEDURE TRAY) ×1 IMPLANT
KIT TURNOVER KIT B (KITS) ×1 IMPLANT
LOCATOR NERVE 3 VOLT (DISPOSABLE) IMPLANT
NDL PRECISIONGLIDE 27X1.5 (NEEDLE) ×1 IMPLANT
NEEDLE PRECISIONGLIDE 27X1.5 (NEEDLE) ×1
NS IRRIG 1000ML POUR BTL (IV SOLUTION) ×1 IMPLANT
PAD ARMBOARD 7.5X6 YLW CONV (MISCELLANEOUS) ×2 IMPLANT
PENCIL FOOT CONTROL (ELECTRODE) ×1 IMPLANT
SHEARS HARMONIC 9CM CVD (BLADE) ×1 IMPLANT
SPECIMEN JAR MEDIUM (MISCELLANEOUS) IMPLANT
SPONGE INTESTINAL PEANUT (DISPOSABLE) IMPLANT
SPONGE T-LAP 18X18 ~~LOC~~+RFID (SPONGE) IMPLANT
STAPLER VISISTAT (STAPLE) IMPLANT
STAPLER VISISTAT 35W (STAPLE) ×1 IMPLANT
SUT CHROMIC 3 0 SH 27 (SUTURE) ×1 IMPLANT
SUT CHROMIC 5 0 P 3 (SUTURE) IMPLANT
SUT ETHILON 3 0 PS 1 (SUTURE) ×1 IMPLANT
SUT ETHILON 5 0 PS 2 18 (SUTURE) IMPLANT
SUT SILK 2 0 REEL (SUTURE) IMPLANT
SUT SILK 3 0 SH CR/8 (SUTURE) ×1 IMPLANT
SUT SILK 4 0 REEL (SUTURE) ×1 IMPLANT
SUT VIC AB 3-0 SH 18 (SUTURE) IMPLANT
TRAY ENT MC OR (CUSTOM PROCEDURE TRAY) ×1 IMPLANT
TRAY FOLEY MTR SLVR 14FR STAT (SET/KITS/TRAYS/PACK) IMPLANT
TUBE 10FR 43IN 5G WT TIP ENFIT (TUBING) IMPLANT
WATER STERILE IRR 1000ML POUR (IV SOLUTION) IMPLANT

## 2023-08-23 NOTE — Transfer of Care (Signed)
Immediate Anesthesia Transfer of Care Note  Patient: Michael Stuart  Procedure(s) Performed: RADICAL NECK DISSECTION (Left: Neck)  Patient Location: PACU  Anesthesia Type:General  Level of Consciousness: awake, alert , oriented, and patient cooperative  Airway & Oxygen Therapy: Patient Spontanous Breathing and Patient connected to face mask  Post-op Assessment: Report given to RN, Post -op Vital signs reviewed and stable, and Patient moving all extremities X 4  Post vital signs: Reviewed and stable  Last Vitals:  Vitals Value Taken Time  BP 174/101 08/23/23 1127  Temp 37.3 C 08/23/23 1117  Pulse 98 08/23/23 1132  Resp 18 08/23/23 1130  SpO2 93 % 08/23/23 1132  Vitals shown include unfiled device data.  Orders received for BP.  Last Pain:  Vitals:   08/23/23 0617  TempSrc:   PainSc: 0-No pain         Complications: There were no known notable events for this encounter.

## 2023-08-23 NOTE — Progress Notes (Signed)
Subjective: Doing well, no complaints.  Objective: Vital signs in last 24 hours: Temp:  [98.4 F (36.9 C)-99.1 F (37.3 C)] 98.9 F (37.2 C) (02/14 1415) Pulse Rate:  [88-104] 92 (02/14 1442) Resp:  [10-19] 18 (02/14 1442) BP: (148-184)/(86-129) 164/92 (02/14 1442) SpO2:  [92 %-98 %] 97 % (02/14 1442) Arterial Line BP: (162-185)/(88-96) 185/92 (02/14 1230) Weight:  [145.6 kg] 145.6 kg (02/14 0558) Weight change:     Intake/Output from previous day: No intake/output data recorded. Intake/Output this shift: Total I/O In: 1500 [I.V.:1500] Out: 215 [Drains:75; Blood:140]  PHYSICAL EXAM: Awake and alert.  Neck looks excellent.  Drain in place and functioning.  Bloody material in the drain.  Cranial nerves are all functioning.  Lab Results: Recent Labs    08/22/23 1455  WBC 6.1  HGB 14.9  HCT 43.9  PLT 207   BMET Recent Labs    08/22/23 1455  NA 139  K 4.3  CL 106  CO2 29  GLUCOSE 121*  BUN 15  CREATININE 1.30*  CALCIUM 9.4    Studies/Results: No results found.  Medications: I have reviewed the patient's current medications.  Assessment/Plan: Stable, immediate postop check.  Continue overnight observation.  LOS: 0 days       Michael Stuart 08/23/2023, 3:42 PM

## 2023-08-23 NOTE — Anesthesia Procedure Notes (Signed)
Procedure Name: Intubation Date/Time: 08/23/2023 7:49 AM  Performed by: Colbert Coyer, CRNAPre-anesthesia Checklist: Patient identified, Emergency Drugs available, Suction available and Patient being monitored Patient Re-evaluated:Patient Re-evaluated prior to induction Oxygen Delivery Method: Circle System Utilized Preoxygenation: Pre-oxygenation with 100% oxygen Induction Type: IV induction Ventilation: Mask ventilation without difficulty Laryngoscope Size: Mac and 4 Grade View: Grade II Tube type: Oral Tube size: 8.0 mm Number of attempts: 1 Airway Equipment and Method: Stylet Placement Confirmation: ETT inserted through vocal cords under direct vision, positive ETCO2 and breath sounds checked- equal and bilateral Secured at: 23 cm Tube secured with: Tape Dental Injury: Teeth and Oropharynx as per pre-operative assessment

## 2023-08-23 NOTE — Plan of Care (Signed)
Problem: Education: Goal: Knowledge of General Education information will improve Description: Including pain rating scale, medication(s)/side effects and non-pharmacologic comfort measures Outcome: Progressing   Problem: Health Behavior/Discharge Planning: Goal: Ability to manage health-related needs will improve Outcome: Progressing   Problem: Clinical Measurements: Goal: Ability to maintain clinical measurements within normal limits will improve Outcome: Progressing Goal: Will remain free from infection Outcome: Progressing Goal: Diagnostic test results will improve Outcome: Progressing Goal: Respiratory complications will improve Outcome: Progressing Goal: Cardiovascular complication will be avoided Outcome: Progressing   Problem: Activity: Goal: Risk for activity intolerance will decrease Outcome: Progressing   Problem: Nutrition: Goal: Adequate nutrition will be maintained Outcome: Progressing   Problem: Coping: Goal: Level of anxiety will decrease Outcome: Progressing   Problem: Elimination: Goal: Will not experience complications related to bowel motility Outcome: Progressing Goal: Will not experience complications related to urinary retention Outcome: Progressing   Problem: Pain Managment: Goal: General experience of comfort will improve and/or be controlled Outcome: Progressing   Problem: Safety: Goal: Ability to remain free from injury will improve Outcome: Progressing   Problem: Skin Integrity: Goal: Risk for impaired skin integrity will decrease Outcome: Progressing   Problem: Education: Goal: Knowledge of the prescribed therapeutic regimen will improve Outcome: Progressing   Problem: Activity: Goal: Ability to tolerate increased activity will improve Outcome: Progressing   Problem: Health Behavior/Discharge Planning: Goal: Identification of resources available to assist in meeting health care needs will improve Outcome: Progressing    Problem: Nutrition: Goal: Maintenance of adequate nutrition will improve Outcome: Progressing   Problem: Clinical Measurements: Goal: Complications related to the disease process, condition or treatment will be avoided or minimized Outcome: Progressing   Problem: Respiratory: Goal: Will regain and/or maintain adequate ventilation Outcome: Progressing   Problem: Skin Integrity: Goal: Demonstration of wound healing without infection will improve Outcome: Progressing

## 2023-08-23 NOTE — Op Note (Addendum)
OPERATIVE REPORT  DATE OF SURGERY: 08/23/2023  PATIENT:  Michael Stuart,  43 y.o. male  PRE-OPERATIVE DIAGNOSIS: Metastatic breast cancer to left neck  POST-OPERATIVE DIAGNOSIS: Metastatic breast cancer to left neck  PROCEDURE:  Procedure(s): Left modified RADICAL NECK DISSECTION  SURGEON:  Susy Frizzle, MD  ASSISTANTS: Scot Jun, PA  ANESTHESIA:   General   EBL: 300 ml  DRAINS: 15 French round JP  LOCAL MEDICATIONS USED:  None  SPECIMEN: Left modified neck dissection including levels 2 through 5.  COUNTS:  Correct  PROCEDURE DETAILS: The patient was taken to the operating room and placed on the operating table in the supine position. Following induction of general endotracheal anesthesia, the left neck was prepped and draped in a standard fashion.  A left-sided "hockey-stick" incision was outlined with a marking pen.  Electrocautery was used to incise the skin and subcutaneous tissue.  Platysma was divided anteriorly.  Subplatysmal flap was developed superiorly up towards the submandibular gland and the angle of the mandible.  Inferiorly this was developed down to the clavicle and into the trapezius muscle.  Stay sutures were used on the superior flap.  The external jugular vein was ligated and divided.  The greater auricular nerve was sacrificed.  The fibrofatty tissue lateral to the sternocleidomastoid muscle was dissected anteriorly.  The muscle was then reflected posteriorly exposing the spinal accessory nerve.  This was then dissected up towards the digastric muscle.  Fibrofatty tissue posterosuperior was then dissected out down to the levator scapula and splenius capitis muscles.  Tissue was brought under the nerve and then brought forward exposing the cervical plexus nerves and the internal jugular vein.  The parotid tail was transected inferiorly bringing the specimen down and exposing the digastric.  Submandibular gland was preserved but fibrofatty tissue posteroinferior  was dissected down towards the Randon's which were then ligated and divided.  The carotid vagus and internal jugular vein were all preserved.  Dissection down inferiorly to the suspected thoracic duct was ligated between clamps and divided.  The dissection continued bringing the posterior triangle down off of the trapezius towards the supraclavicular mass.  This mass was then dissected out of surrounding fatty tissue including what was felt to be axial fashion.  The lower part of the spinal accessory nerve was then identified dissected out all the way to the trapezius and preserved.  The remainder of the level 5 dissection was accomplished.  The main node was approximately 4 cm and firm and was removed with surrounding fatty tissue.  There was 1 other small node adjacent to it that was identified and also removed.  The specimen was oriented on the towel sent for pathologic evaluation.  The wound was irrigated with saline.  Hemostasis and no evidence of chyle leak or pneumothorax was identified with positive pressure ventilation.  The wound was then reapproximated using interrupted 3-0 chromic on the platysma layer and skin staples on the skin.  The drain was exited through a separate stab incision and secured in place with nylon suture.  The drain was charged and held a seal.  Patient was awakened extubated and transferred to recovery in stable condition.  Surgical assistant was necessary in this case for retraction, suctioning and assistance with identifying structures.Marland Kitchen    PATIENT DISPOSITION:  To PACU, stable

## 2023-08-23 NOTE — Anesthesia Procedure Notes (Signed)
Arterial Line Insertion Start/End2/14/2025 7:10 AM, 08/23/2023 7:20 AM Performed by: Lewie Loron, MD, Colbert Coyer, CRNA, CRNA  Patient location: Pre-op. Preanesthetic checklist: patient identified, IV checked, site marked, risks and benefits discussed, surgical consent, monitors and equipment checked, pre-op evaluation, timeout performed and anesthesia consent Lidocaine 1% used for infiltration Right, radial was placed Catheter size: 20 G Hand hygiene performed  and maximum sterile barriers used   Attempts: 1 Procedure performed without using ultrasound guided technique. Following insertion, dressing applied. Post procedure assessment: normal and unchanged  Patient tolerated the procedure well with no immediate complications.

## 2023-08-23 NOTE — Interval H&P Note (Signed)
History and Physical Interval Note:  08/23/2023 7:17 AM  Michael Stuart  has presented today for surgery, with the diagnosis of Cancer of head, face, or neck lymph nodes, secondary.  The various methods of treatment have been discussed with the patient and family. After consideration of risks, benefits and other options for treatment, the patient has consented to  Procedure(s): RADICAL NECK DISSECTION (Left) as a surgical intervention.  The patient's history has been reviewed, patient examined, no change in status, stable for surgery.  I have reviewed the patient's chart and labs.  Questions were answered to the patient's satisfaction.     [name]

## 2023-08-24 ENCOUNTER — Other Ambulatory Visit (HOSPITAL_COMMUNITY): Payer: Self-pay

## 2023-08-24 DIAGNOSIS — I10 Essential (primary) hypertension: Secondary | ICD-10-CM | POA: Diagnosis not present

## 2023-08-24 DIAGNOSIS — C50922 Malignant neoplasm of unspecified site of left male breast: Secondary | ICD-10-CM | POA: Diagnosis not present

## 2023-08-24 DIAGNOSIS — C770 Secondary and unspecified malignant neoplasm of lymph nodes of head, face and neck: Secondary | ICD-10-CM | POA: Diagnosis not present

## 2023-08-24 DIAGNOSIS — Z79899 Other long term (current) drug therapy: Secondary | ICD-10-CM | POA: Diagnosis not present

## 2023-08-24 MED ORDER — HYDROCODONE-ACETAMINOPHEN 5-325 MG PO TABS
1.0000 | ORAL_TABLET | ORAL | 0 refills | Status: DC | PRN
  Filled 2023-08-24: qty 30, 5d supply, fill #0

## 2023-08-24 MED ORDER — ONDANSETRON 4 MG PO TBDP
4.0000 mg | ORAL_TABLET | Freq: Three times a day (TID) | ORAL | 0 refills | Status: DC | PRN
  Filled 2023-08-24: qty 9, 3d supply, fill #0

## 2023-08-24 NOTE — Progress Notes (Signed)
Subjective: Feeling great, no complaints.  Objective: Vital signs in last 24 hours: Temp:  [98.2 F (36.8 C)-99.1 F (37.3 C)] 98.2 F (36.8 C) (02/15 0717) Pulse Rate:  [73-104] 79 (02/15 0717) Resp:  [10-19] 16 (02/15 0717) BP: (112-184)/(57-129) 126/72 (02/15 0717) SpO2:  [92 %-98 %] 97 % (02/15 0717) Arterial Line BP: (162-190)/(88-108) 182/108 (02/14 1300) Weight change:  Last BM Date : 08/23/23  Intake/Output from previous day: 02/14 0701 - 02/15 0700 In: 1502.8 [I.V.:1502.8] Out: 375 [Drains:235; Blood:140] Intake/Output this shift: No intake/output data recorded.  PHYSICAL EXAM: Awake and alert.  Drain is functioning with sanguinous material, light compared to yesterday.  Incision looks excellent.  Cranial nerves are all functioning.  Lab Results: Recent Labs    08/22/23 1455  WBC 6.1  HGB 14.9  HCT 43.9  PLT 207   BMET Recent Labs    08/22/23 1455  NA 139  K 4.3  CL 106  CO2 29  GLUCOSE 121*  BUN 15  CREATININE 1.30*  CALCIUM 9.4    Studies/Results: No results found.  Medications: I have reviewed the patient's current medications.  Assessment/Plan: Stable postop day 1.  Will discharge home with drain.  Follow-up next week.  LOS: 1 day      Michael Stuart 08/24/2023, 8:45 AM

## 2023-08-24 NOTE — Plan of Care (Signed)
  Problem: Education: Goal: Knowledge of General Education information will improve Description: Including pain rating scale, medication(s)/side effects and non-pharmacologic comfort measures Outcome: Progressing   Problem: Health Behavior/Discharge Planning: Goal: Ability to manage health-related needs will improve Outcome: Progressing   Problem: Clinical Measurements: Goal: Ability to maintain clinical measurements within normal limits will improve Outcome: Progressing Goal: Will remain free from infection Outcome: Progressing   Problem: Pain Managment: Goal: General experience of comfort will improve and/or be controlled Outcome: Progressing

## 2023-08-25 NOTE — Discharge Summary (Signed)
Physician Discharge Summary  Patient ID: Michael Stuart MRN: [id number] DOB/AGE: 45-Sep-1982 45 y.o.  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 rate 16, height 6\' 3"  (1.905 m), weight (!) 145.6 kg, SpO2 97%. PHYSICAL EXAM: Awake and alert.  Breathing and voice are clear.  Cranial nerves are all functioning normally.  Incision looks excellent.  Drain is functioning with slightly lightening sanguinous material.  Disposition: Discharge disposition: 01-Home or Self Care       Discharge Instructions     Diet - low sodium heart healthy   Complete by: As directed    Increase activity slowly   Complete by: As directed       Allergies as of 08/24/2023       Reactions   Bactrim [sulfamethoxazole-trimethoprim] Other (See Comments)   Blood capillaries rupture    Ovace Plus Wash [sulfacetamide Sodium] Other (See Comments)   Blood capillaries rupture         Medication List     TAKE these medications    abemaciclib 150 MG tablet Commonly known as: Verzenio Take 1 tablet (150 mg total) by mouth 2 (two) times daily. Start taking on: August 29, 2023   atorvastatin 20 MG tablet Commonly known as: LIPITOR TAKE 1 TABLET BY MOUTH EVERY DAY   diphenhydramine-acetaminophen 25-500 MG Tabs tablet Commonly known as: TYLENOL PM Take 3 tablets by mouth at bedtime.   Entresto 97-103 MG Generic drug: sacubitril-valsartan Take 1 tablet by mouth 2 (two) times daily.   eplerenone 50 MG tablet Commonly known as: INSPRA Take 1 tablet by  mouth daily.   gabapentin 100 MG capsule Commonly known as: Neurontin Take 1 capsule (100 mg total) by mouth 3 (three) times daily. What changed:  when to take this reasons to take this   HYDROcodone-acetaminophen 5-325 MG tablet Commonly known as: NORCO/VICODIN Take 1 tablet by mouth every 4 (four) hours as needed for moderate pain (pain score 4-6).   metoprolol succinate 50 MG 24 hr tablet Commonly known as: TOPROL-XL Take 1 tablet (50 mg total) by mouth daily.   ondansetron 4 MG disintegrating tablet Commonly known as: ZOFRAN-ODT Take 1 tablet (4 mg total) by mouth every 8 (eight) hours as needed for nausea or vomiting.   tamoxifen 20 MG tablet Commonly known as: NOLVADEX TAKE 1 TABLET(20 MG) BY MOUTH DAILY   VITAMIN B-6 PO Take 1 tablet by mouth 2 (two) times daily.         Signed: [name] 08/25/2023, 11:23 AM

## 2023-08-26 ENCOUNTER — Encounter (HOSPITAL_COMMUNITY): Payer: BC Managed Care – PPO | Admitting: Cardiology

## 2023-08-26 ENCOUNTER — Encounter (HOSPITAL_COMMUNITY): Payer: Self-pay | Admitting: Otolaryngology

## 2023-08-26 LAB — SURGICAL PATHOLOGY

## 2023-08-26 NOTE — Anesthesia Postprocedure Evaluation (Signed)
Anesthesia Post Note  Patient: Michael Stuart  Procedure(s) Performed: RADICAL NECK DISSECTION (Left: Neck)     Patient location during evaluation: PACU Anesthesia Type: General Level of consciousness: patient cooperative and awake and alert Pain management: pain level controlled Vital Signs Assessment: post-procedure vital signs reviewed and stable Respiratory status: spontaneous breathing Cardiovascular status: stable Anesthetic complications: no   There were no known notable events for this encounter.  Last Vitals:  Vitals:   08/24/23 0400 08/24/23 0717  BP: (!) 112/95 126/72  Pulse: 73 79  Resp: 18 16  Temp: 37.2 C 36.8 C  SpO2: 97% 97%    Last Pain:  Vitals:   08/24/23 1100  TempSrc:   PainSc: 0-No pain                 Lewie Loron

## 2023-08-27 DIAGNOSIS — C770 Secondary and unspecified malignant neoplasm of lymph nodes of head, face and neck: Secondary | ICD-10-CM | POA: Diagnosis not present

## 2023-08-30 ENCOUNTER — Telehealth: Payer: Self-pay | Admitting: *Deleted

## 2023-08-30 NOTE — Telephone Encounter (Signed)
Received call from pt requesting refill for Norco for pain from recent surgery.  MD out of office, RN educated pt to contact Dr. Lucky Rathke office for refill.  Pt verbalized understanding.

## 2023-09-02 ENCOUNTER — Ambulatory Visit: Payer: BC Managed Care – PPO | Admitting: Hematology and Oncology

## 2023-09-03 ENCOUNTER — Inpatient Hospital Stay: Payer: BC Managed Care – PPO

## 2023-09-03 ENCOUNTER — Ambulatory Visit: Payer: BC Managed Care – PPO | Admitting: Hematology and Oncology

## 2023-09-03 ENCOUNTER — Inpatient Hospital Stay: Payer: BC Managed Care – PPO | Admitting: Hematology and Oncology

## 2023-09-03 ENCOUNTER — Inpatient Hospital Stay (HOSPITAL_BASED_OUTPATIENT_CLINIC_OR_DEPARTMENT_OTHER): Payer: BC Managed Care – PPO | Admitting: Hematology and Oncology

## 2023-09-03 DIAGNOSIS — C770 Secondary and unspecified malignant neoplasm of lymph nodes of head, face and neck: Secondary | ICD-10-CM | POA: Diagnosis not present

## 2023-09-03 DIAGNOSIS — Z79810 Long term (current) use of selective estrogen receptor modulators (SERMs): Secondary | ICD-10-CM | POA: Diagnosis not present

## 2023-09-03 DIAGNOSIS — C50422 Malignant neoplasm of upper-outer quadrant of left male breast: Secondary | ICD-10-CM | POA: Diagnosis not present

## 2023-09-03 DIAGNOSIS — Z170 Estrogen receptor positive status [ER+]: Secondary | ICD-10-CM | POA: Diagnosis not present

## 2023-09-03 DIAGNOSIS — Z79899 Other long term (current) drug therapy: Secondary | ICD-10-CM | POA: Diagnosis not present

## 2023-09-03 DIAGNOSIS — Z1732 Human epidermal growth factor receptor 2 negative status: Secondary | ICD-10-CM | POA: Diagnosis not present

## 2023-09-03 DIAGNOSIS — Z1721 Progesterone receptor positive status: Secondary | ICD-10-CM | POA: Diagnosis not present

## 2023-09-03 DIAGNOSIS — Z9013 Acquired absence of bilateral breasts and nipples: Secondary | ICD-10-CM | POA: Diagnosis not present

## 2023-09-03 LAB — CBC WITH DIFFERENTIAL (CANCER CENTER ONLY)
Abs Immature Granulocytes: 0.03 10*3/uL (ref 0.00–0.07)
Basophils Absolute: 0.1 10*3/uL (ref 0.0–0.1)
Basophils Relative: 1 %
Eosinophils Absolute: 0.2 10*3/uL (ref 0.0–0.5)
Eosinophils Relative: 2 %
HCT: 48.7 % (ref 39.0–52.0)
Hemoglobin: 16.3 g/dL (ref 13.0–17.0)
Immature Granulocytes: 0 %
Lymphocytes Relative: 30 %
Lymphs Abs: 2.8 10*3/uL (ref 0.7–4.0)
MCH: 31.9 pg (ref 26.0–34.0)
MCHC: 33.5 g/dL (ref 30.0–36.0)
MCV: 95.3 fL (ref 80.0–100.0)
Monocytes Absolute: 0.7 10*3/uL (ref 0.1–1.0)
Monocytes Relative: 7 %
Neutro Abs: 5.5 10*3/uL (ref 1.7–7.7)
Neutrophils Relative %: 60 %
Platelet Count: 317 10*3/uL (ref 150–400)
RBC: 5.11 MIL/uL (ref 4.22–5.81)
RDW: 12.6 % (ref 11.5–15.5)
WBC Count: 9.2 10*3/uL (ref 4.0–10.5)
nRBC: 0 % (ref 0.0–0.2)

## 2023-09-03 LAB — CMP (CANCER CENTER ONLY)
ALT: 30 U/L (ref 0–44)
AST: 29 U/L (ref 15–41)
Albumin: 4.5 g/dL (ref 3.5–5.0)
Alkaline Phosphatase: 68 U/L (ref 38–126)
Anion gap: 10 (ref 5–15)
BUN: 14 mg/dL (ref 6–20)
CO2: 25 mmol/L (ref 22–32)
Calcium: 10.2 mg/dL (ref 8.9–10.3)
Chloride: 105 mmol/L (ref 98–111)
Creatinine: 1.24 mg/dL (ref 0.61–1.24)
GFR, Estimated: >60 mL/min (ref >60)
Glucose, Bld: 127 mg/dL — ABNORMAL HIGH (ref 70–99)
Potassium: 4.3 mmol/L (ref 3.5–5.1)
Sodium: 140 mmol/L (ref 135–145)
Total Bilirubin: 0.4 mg/dL (ref 0.0–1.2)
Total Protein: 7.9 g/dL (ref 6.5–8.1)

## 2023-09-03 NOTE — Assessment & Plan Note (Signed)
 09/15/2021:Palpable left breast mass x8 months. CT 08/27/2021 in ED showed 3.8 cm left breast mass with left axillary lymph nodes.Mamm and [place]: 4 cm mass with 5 axillary lymph nodes: Biopsy: Grade 2 IDC, lymph node: Positive, ER 100%, PR 50%, Ki-67 25%, HER2 negative ratio 1.25, copy #3.45 (Recent history of coronary artery disease EF 22% and stroke)   Treatment plan: 1. Neoadjuvant chemotherapy with Taxotere and Cytoxan every 3 weeks x6 cycles completed 01/16/2022 2. left mastectomy: Residual grade 2 IDC 3.5 cm, margins negative, 2/2 lymph nodes positive, angiolymphatic invasion present, ER 100%, PR 50%, HER2 negative, Ki-67 25% 3. Followed by adjuvant radiation therapy 04/03/22-05/16/22 4.  Followed by adjuvant antiestrogen therapy with tamoxifen started November 2023 5. 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 twice daily) versus Faslodex Verzinio. We will send path for Caris molecular testing. Because of prior radiation therapy additional radiation cannot be administered.

## 2023-09-03 NOTE — Assessment & Plan Note (Deleted)
 09/15/2021:Palpable left breast mass x8 months. CT 08/27/2021 in ED showed 3.8 cm left breast mass with left axillary lymph nodes.Mamm and [place]: 4 cm mass with 5 axillary lymph nodes: Biopsy: Grade 2 IDC, lymph node: Positive, ER 100%, PR 50%, Ki-67 25%, HER2 negative ratio 1.25, copy #3.45 (Recent history of coronary artery disease EF 22% and stroke)   Treatment plan: 1. Neoadjuvant chemotherapy with Taxotere and Cytoxan every 3 weeks x6 cycles completed 01/16/2022 2. left mastectomy: Residual grade 2 IDC 3.5 cm, margins negative, 2/2 lymph nodes positive, angiolymphatic invasion present, ER 100%, PR 50%, HER2 negative, Ki-67 25% 3. Followed by adjuvant radiation therapy 04/03/22-05/16/22 4.  Followed by adjuvant antiestrogen therapy with tamoxifen started November 2023 5. 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 twice daily) versus Faslodex Verzinio. We will send path for Caris molecular testing. Because of prior radiation therapy additional radiation cannot be administered.

## 2023-09-03 NOTE — Progress Notes (Signed)
 HEMATOLOGY-ONCOLOGY TELEPHONE VISIT PROGRESS NOTE  I connected with our patient on 09/03/23 at  2:00 PM EST by telephone and verified that I am speaking with the correct person using two identifiers.  I discussed the limitations, risks, security and privacy concerns of performing an evaluation and management service by telephone and the availability of in person appointments.  I also discussed with the patient that there may be a patient responsible charge related to this service. The patient expressed understanding and agreed to proceed.   History of Present Illness:    History of Present Illness The patient, with a history of breast cancer, presents for a post-operative follow-up after recent surgery. He reports that he had his staples removed by Dr. Pollyann Kennedy and is scheduled for another follow-up in April. Pathology from the surgery revealed involvement of three out of nineteen lymph nodes, with aggressive appearing cells and extra nodal extension. The patient has been on Verzenio for his breast cancer, and the dosage was recently increased to 150mg  three days ago. Since the increase, he reports feeling more tired. He also has a history of high glucose levels, but it is unclear if these readings were fasting or not. The patient is also taking a multivitamin and inquired about the possibility of adding other vitamins to his regimen.    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 CT 08/27/2021 in ED showed 3.8 centimeter left breast mass with left axillary lymph nodes.Mamm and [place]: 4 cm mass with 5 axillary lymph nodes: Biopsy: Grade 2 IDC, lymph node: Positive, ER 100%, PR 50%, Ki-67 25%, HER2 negative ratio 1.25, copy #3.45   09/25/2021 Cancer Staging   Staging form: Breast, AJCC 8th Edition - Clinical: Stage IIA (cT2, cN1, cM0, G2, ER+, PR+, HER2-) - Signed  by Serena Croissant, MD on 09/25/2021 Histologic grading system: 3 grade system   10/03/2021 - 01/18/2022 Chemotherapy   Patient is on Treatment Plan : BREAST TC q21d     01/22/2022 Genetic Testing   Negative genetic testing on the CancerNext-Expanded+RNAinsight.  The report date is January 22, 2022.  The CancerNext-Expanded gene panel offered by [facility] and includes sequencing and rearrangement analysis for the following 77 genes: AIP, ALK, APC*, ATM*, AXIN2, BAP1, BARD1, BLM, BMPR1A, BRCA1*, BRCA2*, BRIP1*, CDC73, CDH1*, CDK4, CDKN1B, CDKN2A, CHEK2*, CTNNA1, DICER1, FANCC, FH, FLCN, GALNT12, KIF1B, LZTR1, MAX, MEN1, MET, MLH1*, MSH2*, MSH3, MSH6*, MUTYH*, NBN, NF1*, NF2, NTHL1, PALB2*, PHOX2B, PMS2*, POT1, PRKAR1A, PTCH1, PTEN*, RAD51C*, RAD51D*, RB1, RECQL, RET, SDHA, SDHAF2, SDHB, SDHC, SDHD, SMAD4, SMARCA4, SMARCB1, SMARCE1, STK11, SUFU, TMEM127, TP53*, TSC1, TSC2, VHL and XRCC2 (sequencing and deletion/duplication); EGFR, EGLN1, HOXB13, KIT, MITF, PDGFRA, POLD1, and POLE (sequencing only); EPCAM and GREM1 (deletion/duplication only). DNA and RNA analyses performed for * genes.      REVIEW OF SYSTEMS:   Constitutional: Denies fevers, chills or abnormal weight loss All other systems were reviewed with the patient and are negative. 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 CT 08/27/2021 in ED showed 3.8 cm left breast mass with left axillary lymph nodes.Mamm and [place]: 4 cm mass with 5 axillary lymph nodes: Biopsy: Grade 2 IDC, lymph node: Positive, ER 100%, PR 50%, Ki-67 25%, HER2 negative ratio 1.25, copy #3.45 (Recent history of coronary artery disease  EF 22% and stroke)   Treatment plan: 1. Neoadjuvant chemotherapy with Taxotere and Cytoxan every 3 weeks x6 cycles completed 01/16/2022 2. left mastectomy: Residual grade 2 IDC 3.5 cm, margins  negative, 2/2 lymph nodes positive, angiolymphatic invasion present, ER 100%, PR 50%, HER2 negative, Ki-67 25% 3. Followed by adjuvant radiation therapy 04/03/22-05/16/22 4.  Followed by adjuvant antiestrogen therapy with tamoxifen started November 2023 5. 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 twice daily) versus Faslodex Verzinio. We will send path for Caris molecular testing. Because of prior radiation therapy additional radiation cannot be administered.  --------------------------------- Assessment and Plan Assessment & Plan Breast Cancer with lymph node involvement Post-operative status with 3 out of 19 lymph nodes involved. Visible disease removed, now treating potential invisible cells in the bloodstream. -Continue Verzenio at increased dose of 150mg . -Consider Faslodex injection if unable to tolerate Verzenio at current dose. -Plan to recheck Signatera in a month.  Fatigue Increased since dose of Verzenio was increased. -Monitor and reassess at next visit.  Renal Function Improved creatinine from 1.5 to 1.2 with increased fluid intake. -Continue increased fluid intake.  General Health Maintenance -Continue multivitamin. -Avoid additional vitamins due to potential interaction with Tamoxifen. -Plan for CT scan 3 months  post-surgery. -Follow-up appointment in a month for blood work and check-in.    I discussed the assessment and treatment plan with the patient. The patient was provided an opportunity to ask questions and all were answered. The patient agreed with the plan and demonstrated an understanding of the instructions. The patient was advised to call back or seek an in-person evaluation if the symptoms worsen or if the condition fails to improve as anticipated.   I provided 20 minutes of non-face-to-face time during this encounter.  This includes time for charting and coordination of care   Tamsen Meek, MD

## 2023-09-18 ENCOUNTER — Other Ambulatory Visit: Payer: Self-pay | Admitting: Cardiology

## 2023-09-19 ENCOUNTER — Inpatient Hospital Stay (HOSPITAL_BASED_OUTPATIENT_CLINIC_OR_DEPARTMENT_OTHER): Admitting: Physician Assistant

## 2023-09-19 ENCOUNTER — Inpatient Hospital Stay: Attending: Hematology and Oncology

## 2023-09-19 ENCOUNTER — Telehealth: Payer: Self-pay | Admitting: *Deleted

## 2023-09-19 ENCOUNTER — Inpatient Hospital Stay

## 2023-09-19 ENCOUNTER — Other Ambulatory Visit: Payer: Self-pay | Admitting: Cardiology

## 2023-09-19 ENCOUNTER — Inpatient Hospital Stay (HOSPITAL_BASED_OUTPATIENT_CLINIC_OR_DEPARTMENT_OTHER): Admitting: Hematology and Oncology

## 2023-09-19 ENCOUNTER — Other Ambulatory Visit: Payer: Self-pay | Admitting: *Deleted

## 2023-09-19 VITALS — BP 134/87 | HR 105 | Temp 97.7°F | Resp 18 | Ht 75.0 in | Wt 316.0 lb

## 2023-09-19 DIAGNOSIS — R109 Unspecified abdominal pain: Secondary | ICD-10-CM | POA: Diagnosis not present

## 2023-09-19 DIAGNOSIS — Z170 Estrogen receptor positive status [ER+]: Secondary | ICD-10-CM | POA: Diagnosis not present

## 2023-09-19 DIAGNOSIS — R5383 Other fatigue: Secondary | ICD-10-CM | POA: Diagnosis not present

## 2023-09-19 DIAGNOSIS — Z1732 Human epidermal growth factor receptor 2 negative status: Secondary | ICD-10-CM | POA: Diagnosis not present

## 2023-09-19 DIAGNOSIS — C50422 Malignant neoplasm of upper-outer quadrant of left male breast: Secondary | ICD-10-CM | POA: Insufficient documentation

## 2023-09-19 DIAGNOSIS — Z79899 Other long term (current) drug therapy: Secondary | ICD-10-CM | POA: Insufficient documentation

## 2023-09-19 DIAGNOSIS — R000 Tachycardia, unspecified: Secondary | ICD-10-CM | POA: Insufficient documentation

## 2023-09-19 DIAGNOSIS — C770 Secondary and unspecified malignant neoplasm of lymph nodes of head, face and neck: Secondary | ICD-10-CM | POA: Diagnosis not present

## 2023-09-19 DIAGNOSIS — Z87891 Personal history of nicotine dependence: Secondary | ICD-10-CM | POA: Insufficient documentation

## 2023-09-19 DIAGNOSIS — Z1721 Progesterone receptor positive status: Secondary | ICD-10-CM | POA: Insufficient documentation

## 2023-09-19 DIAGNOSIS — Z79810 Long term (current) use of selective estrogen receptor modulators (SERMs): Secondary | ICD-10-CM | POA: Diagnosis not present

## 2023-09-19 DIAGNOSIS — R10A0 Flank pain, unspecified side: Secondary | ICD-10-CM

## 2023-09-19 LAB — CMP (CANCER CENTER ONLY)
ALT: 46 U/L — ABNORMAL HIGH (ref 0–44)
AST: 38 U/L (ref 15–41)
Albumin: 4.6 g/dL (ref 3.5–5.0)
Alkaline Phosphatase: 61 U/L (ref 38–126)
Anion gap: 7 (ref 5–15)
BUN: 11 mg/dL (ref 6–20)
CO2: 27 mmol/L (ref 22–32)
Calcium: 9.4 mg/dL (ref 8.9–10.3)
Chloride: 105 mmol/L (ref 98–111)
Creatinine: 1.31 mg/dL — ABNORMAL HIGH (ref 0.61–1.24)
GFR, Estimated: >60 mL/min (ref >60)
Glucose, Bld: 102 mg/dL — ABNORMAL HIGH (ref 70–99)
Potassium: 3.9 mmol/L (ref 3.5–5.1)
Sodium: 139 mmol/L (ref 135–145)
Total Bilirubin: 0.5 mg/dL (ref 0.0–1.2)
Total Protein: 8 g/dL (ref 6.5–8.1)

## 2023-09-19 LAB — URINALYSIS, COMPLETE (UACMP) WITH MICROSCOPIC
Bacteria, UA: NONE SEEN
Bilirubin Urine: NEGATIVE
Glucose, UA: NEGATIVE mg/dL
Ketones, ur: NEGATIVE mg/dL
Leukocytes,Ua: NEGATIVE
Nitrite: NEGATIVE
Protein, ur: NEGATIVE mg/dL
Specific Gravity, Urine: 1.006 (ref 1.005–1.030)
pH: 5 (ref 5.0–8.0)

## 2023-09-19 LAB — CBC WITH DIFFERENTIAL (CANCER CENTER ONLY)
Abs Immature Granulocytes: 0.03 10*3/uL (ref 0.00–0.07)
Basophils Absolute: 0.1 10*3/uL (ref 0.0–0.1)
Basophils Relative: 1 %
Eosinophils Absolute: 0.1 10*3/uL (ref 0.0–0.5)
Eosinophils Relative: 1 %
HCT: 43.6 % (ref 39.0–52.0)
Hemoglobin: 14.8 g/dL (ref 13.0–17.0)
Immature Granulocytes: 0 %
Lymphocytes Relative: 26 %
Lymphs Abs: 2.5 10*3/uL (ref 0.7–4.0)
MCH: 32 pg (ref 26.0–34.0)
MCHC: 33.9 g/dL (ref 30.0–36.0)
MCV: 94.4 fL (ref 80.0–100.0)
Monocytes Absolute: 0.6 10*3/uL (ref 0.1–1.0)
Monocytes Relative: 6 %
Neutro Abs: 6.2 10*3/uL (ref 1.7–7.7)
Neutrophils Relative %: 66 %
Platelet Count: 242 10*3/uL (ref 150–400)
RBC: 4.62 MIL/uL (ref 4.22–5.81)
RDW: 12.9 % (ref 11.5–15.5)
WBC Count: 9.5 10*3/uL (ref 4.0–10.5)
nRBC: 0 % (ref 0.0–0.2)

## 2023-09-19 MED ORDER — SODIUM CHLORIDE 0.9 % IV SOLN: Freq: Once | INTRAVENOUS | Status: AC

## 2023-09-19 MED ORDER — MORPHINE SULFATE (PF) 4 MG/ML IV SOLN
4.0000 mg | Freq: Once | INTRAVENOUS | Status: AC
  Administered 2023-09-19: 4 mg via INTRAVENOUS
  Filled 2023-09-19: qty 1

## 2023-09-19 MED ORDER — ONDANSETRON 4 MG PO TBDP: 4.0000 mg | ORAL_TABLET | Freq: Three times a day (TID) | ORAL | 0 refills | Status: DC | PRN

## 2023-09-19 MED ORDER — ABEMACICLIB 100 MG PO TABS: 100.0000 mg | ORAL_TABLET | Freq: Two times a day (BID) | ORAL | 6 refills | Status: DC

## 2023-09-19 MED ORDER — ONDANSETRON HCL 4 MG/2ML IJ SOLN
4.0000 mg | Freq: Once | INTRAMUSCULAR | Status: AC
  Administered 2023-09-19: 4 mg via INTRAVENOUS
  Filled 2023-09-19: qty 2

## 2023-09-19 MED ORDER — HYDROCODONE-ACETAMINOPHEN 5-325 MG PO TABS: 1.0000 | ORAL_TABLET | Freq: Four times a day (QID) | ORAL | 0 refills | Status: AC | PRN

## 2023-09-19 MED ORDER — TAMSULOSIN HCL 0.4 MG PO CAPS: 0.4000 mg | ORAL_CAPSULE | Freq: Every day | ORAL | 0 refills | Status: AC

## 2023-09-19 NOTE — Progress Notes (Signed)
 Symptom Management Consult Note [facility]    Patient Care Team: Pcp, No as PCP - General Pershing Proud, RN as Oncology Nurse Navigator Donnelly Angelica, RN as Oncology Nurse Navigator Elwyn Reach (Neurology) Serena Croissant, MD as Medical Oncologist (Hematology and Oncology)    Name / MRN / DOB: Michael [facility]  [id number]  05-02-1981   Date of visit: 09/19/2023   Chief Complaint/Reason for visit: urinary frequency and flank pain   Current Therapy: Verzenio    ASSESSMENT & PLAN: Patient is a 43 y.o. male with oncologic history of malignant neoplasm of upper-outer quadrant of left breast in male, estrogen receptor positive followed by Dr. Pamelia Hoit.  I have viewed most recent oncology note and lab work.    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 Tolerating fair overall per patient. Due to follow up with oncologist at the end of the month.   #Flank pain - Chart review shows history of right kidney stone in 2020. Symptoms today are similar to that per HPI. - He is afebrile and normotensive. He is tachycardic to 124 on arrival. Chart review shows tachycardia has been mostly persistent since 07/2023. Patient takes metoprolol daily and is scheduled to follow up with cardiologist on 3/17. He does not feel as if his heart is racing and denies palpitations. - Physical exam he looks to be uncomfortable although is non toxic appearing. He has + left CVA tenderness. Benign abdominal exam. - Patient received IV morphine, Zofran and IVF in clinic for symptom management. On reassessment pain has significantly improved and is less tachycardic 106. - UA does not show infection however does have moderate blood. CBC is unremarkable. CMP shows creatinine with mild elevation 1.31. - Engaged in shared decision making with patient regarding imaging.  As this feels similar to kidney stone  he has in the past he wants to hold off on CT the pelvis or ultrasound.  This is reasonable to try however I recommended to patient if he develops worsening pain or urinary problems he would need to seek emergent evaluation in the emergency department.  Patient knows that he needs to increase fluid intake to help pass the stone and improve his renal function. -Prescriptions sent to his pharmacy for Flomax, Zofran and norco. I have reviewed the PDMP during this encounter.     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 CT 08/27/2021 in ED showed 3.8 centimeter left breast mass with left axillary lymph nodes.Mamm and [place]: 4 cm mass with 5 axillary lymph nodes: Biopsy: Grade 2 IDC, lymph node: Positive, ER 100%, PR 50%, Ki-67 25%, HER2 negative ratio 1.25, copy #3.45   09/25/2021 Cancer Staging   Staging form: Breast, AJCC 8th Edition - Clinical: Stage IIA (cT2, cN1, cM0, G2, ER+, PR+, HER2-) - Signed by Serena Croissant, MD on 09/25/2021 Histologic grading system: 3 grade system   10/03/2021 - 01/18/2022 Chemotherapy   Patient is on Treatment Plan : BREAST TC q21d     01/22/2022 Genetic Testing   Negative genetic testing on the CancerNext-Expanded+RNAinsight.  The report date is January 22, 2022.  The CancerNext-Expanded gene panel offered by [facility] and includes sequencing and rearrangement analysis for the following 77 genes: AIP, ALK, APC*, ATM*, AXIN2, BAP1, BARD1, BLM,  BMPR1A, BRCA1*, BRCA2*, BRIP1*, CDC73, CDH1*, CDK4, CDKN1B, CDKN2A, CHEK2*, CTNNA1, DICER1, FANCC, FH, FLCN, GALNT12, KIF1B, LZTR1, MAX, MEN1, MET, MLH1*, MSH2*, MSH3, MSH6*, MUTYH*, NBN, NF1*, NF2, NTHL1, PALB2*, PHOX2B, PMS2*, POT1, PRKAR1A, PTCH1, PTEN*, RAD51C*, RAD51D*, RB1, RECQL, RET, SDHA, SDHAF2, SDHB, SDHC, SDHD, SMAD4, SMARCA4, SMARCB1, SMARCE1, STK11, SUFU,  TMEM127, TP53*, TSC1, TSC2, VHL and XRCC2 (sequencing and deletion/duplication); EGFR, EGLN1, HOXB13, KIT, MITF, PDGFRA, POLD1, and POLE (sequencing only); EPCAM and GREM1 (deletion/duplication only). DNA and RNA analyses performed for * genes.        Interval history-: Discussed the use of AI scribe software for clinical note transcription with the patient, who gave verbal consent to proceed.   Michael [facility] is a 43 y.o. male with oncologic history as above presenting to [facility] today with chief complaint of acute onset of flank pain and urinary symptoms. Patient presents unaccompanied to visit today.  He started experiencing pain in the lower left abdomen radiating to the back last night, rated as 9/10 in severity, which decreased to 6/10 after changing positions. He also has frequent urination with low volume and a burning sensation, but no blood or cloudiness in the urine. He has a history of kidney stones, with the last occurrence in 2020. The current pain is somewhat reminiscent of that experience. He remembers passing the stone and did not need to follow up with urology. No history of UTIs. He experienced chills and was awakened by the pain at 2 AM. He took Tylenol last night, which did not alleviate the pain.  He adds that he started Verzenio five weeks ago, initially at 200 mg daily, increased to 300 mg daily three weeks ago. Since the increase, he has experienced nausea and significant fatigue. He has nausea this morning but no vomiting. No diarrhea is reported.   ROS  All other systems are reviewed and are negative for acute change except as noted in the HPI.    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   Pneumonia    Stroke (HCC) 09/01/2021   in setting of LV thrombus     Past Surgical History:  Procedure Laterality Date   IR IMAGING GUIDED PORT INSERTION  09/28/2021   MASTECTOMY W/ SENTINEL NODE BIOPSY Left 02/21/2022   Procedure: LEFT SIMPLE MASTECTOMY WITH LEFT TARGETED LYMPH NODE BIOPSY;  Surgeon: Harriette Bouillon, MD;  Location: [facility] OR;  Service: General;  Laterality: Left;  GEN w/ PEC BLOCK   PORT-A-CATH REMOVAL Right 02/21/2022   Procedure: PORT REMOVAL;  Surgeon: Harriette Bouillon, MD;  Location: [facility] OR;  Service: General;  Laterality: Right;   RADICAL NECK DISSECTION Left 08/23/2023   Procedure: RADICAL NECK DISSECTION;  Surgeon: [name], MD;  Location: [facility];  Service: ENT;  Laterality: Left;   RIGHT/LEFT HEART CATH AND CORONARY ANGIOGRAPHY N/A 08/28/2021   Procedure: RIGHT/LEFT HEART CATH AND CORONARY ANGIOGRAPHY;  Surgeon: Elder Negus, MD;  Location: [facility] INVASIVE CV LAB;  Service: Cardiovascular;  Laterality: N/A;    Social  History   Socioeconomic History   Marital status: Married    Spouse name: Not on file   Number of children: 2   Years of education: Not on file   Highest education level: Bachelor's degree (e.g., BA, AB, BS)  Occupational History   Occupation: crash Manufacturing systems engineer    Employer: [employer]  Tobacco Use   Smoking status: Former    Current packs/day: 0.00    Average packs/day: 0.3 packs/day for 25.0 years (6.3 ttl pk-yrs)    Types: Cigarettes    Start date: 51    Quit date: 2022    Years since quitting: 3.1   Smokeless tobacco: Former   Tobacco comments:    Smoked 2PPD x 10 years. Last 13 years 3-4 cigarettes/day.   Vaping Use   Vaping status: Former   Substances: Nicotine  Substance and Sexual Activity   Alcohol use: Yes    Alcohol/week: 21.0  standard drinks of alcohol    Types: 21 Cans of beer per week    Comment: 2 tall boys per day, everyday x 20years   Drug use: Not Currently    Frequency: 1.0 times per week    Types: Marijuana    Comment: 3x per month   Sexual activity: Not Currently  Other Topics Concern   Not on file  Social History Narrative   Right handed    Social Drivers of Health   Financial Resource Strain: Low Risk  (08/29/2021)   Overall Financial Resource Strain (CARDIA)    Difficulty of Paying Living Expenses: Not very hard  Food Insecurity: No Food Insecurity (08/23/2023)   Hunger Vital Sign    Worried About Running Out of Food in the Last Year: Never true    Ran Out of Food in the Last Year: Never true  Transportation Needs: No Transportation Needs (08/23/2023)   PRAPARE - Administrator, Civil Service (Medical): No    Lack of Transportation (Non-Medical): No  Physical Activity: Not on file  Stress: Not on file  Social Connections: Unknown (11/09/2021)   Received from [facility]   Social Network    Social Network: Not on file  Intimate Partner Violence: Not At Risk (08/23/2023)   Humiliation, Afraid, Rape, and Kick questionnaire    Fear of Current or Ex-Partner: No    Emotionally Abused: No    Physically Abused: No    Sexually Abused: No    Family History  Problem Relation Age of Onset   Healthy Mother      Current Outpatient Medications:    atorvastatin (LIPITOR) 20 MG tablet, TAKE 1 TABLET BY MOUTH EVERY DAY, Disp: 30 tablet, Rfl: 0   ondansetron (ZOFRAN-ODT) 4 MG disintegrating tablet, Take 1 tablet (4 mg total) by mouth every 8 (eight) hours as needed for nausea or vomiting., Disp: 20 tablet, Rfl: 0   tamsulosin (FLOMAX) 0.4 MG CAPS capsule, Take 1 capsule (0.4 mg total) by mouth daily for 10 days., Disp: 10 capsule, Rfl: 0   abemaciclib (VERZENIO) 100 MG tablet, Take 1 tablet (100 mg total) by mouth 2 (two) times daily., Disp: 60 tablet, Rfl: 6    diphenhydramine-acetaminophen (TYLENOL PM) 25-500 MG TABS tablet, Take 3 tablets by mouth at bedtime., Disp: , Rfl:    eplerenone (INSPRA) 50 MG tablet, Take 1 tablet by mouth daily. (Patient not taking: Reported on 08/23/2023), Disp: 90 tablet, Rfl: 3   gabapentin (NEURONTIN) 100 MG capsule, Take 1 capsule (100 mg total) by mouth 3 (three)  times daily. (Patient taking differently: Take 100 mg by mouth 3 (three) times daily as needed (neuropathy).), Disp: 90 capsule, Rfl: 3   HYDROcodone-acetaminophen (NORCO/VICODIN) 5-325 MG tablet, Take 1 tablet by mouth every 6 (six) hours as needed for up to 5 days for severe pain (pain score 7-10)., Disp: 20 tablet, Rfl: 0   metoprolol succinate (TOPROL-XL) 50 MG 24 hr tablet, Take 1 tablet (50 mg total) by mouth daily., Disp: 90 tablet, Rfl: 3   Pyridoxine HCl (VITAMIN B-6 PO), Take 1 tablet by mouth 2 (two) times daily., Disp: , Rfl:    sacubitril-valsartan (ENTRESTO) 97-103 MG, Take 1 tablet by mouth 2 (two) times daily. (Patient not taking: Reported on 08/23/2023), Disp: 60 tablet, Rfl: 11   tamoxifen (NOLVADEX) 20 MG tablet, TAKE 1 TABLET(20 MG) BY MOUTH DAILY, Disp: 90 tablet, Rfl: 3  PHYSICAL EXAM: ECOG FS:1 - Symptomatic but completely ambulatory    Vitals:   09/19/23 1142 09/19/23 1400 09/19/23 1422  BP: (!) 145/97  134/87  Pulse: (!) 120 (!) 106 (!) 105  Resp: 19  18  Temp: 97.7 F (36.5 C)    TempSrc: Temporal    SpO2: 98%  98%  Weight: (!) 316 lb (143.3 kg)    Height: 6\' 3"  (1.905 m)     Physical Exam Vitals and nursing note reviewed.  Constitutional:      Appearance: He is not ill-appearing or toxic-appearing.     Comments: Uncomfortable appearing  HENT:     Head: Normocephalic.  Eyes:     Conjunctiva/sclera: Conjunctivae normal.  Cardiovascular:     Rate and Rhythm: Regular rhythm. Tachycardia present.     Pulses: Normal pulses.     Heart sounds: Normal heart sounds.  Pulmonary:     Effort: Pulmonary effort is normal.      Breath sounds: Normal breath sounds.  Abdominal:     General: There is no distension.     Palpations: Abdomen is soft.     Tenderness: There is no abdominal tenderness. There is left CVA tenderness. There is no right CVA tenderness.  Musculoskeletal:     Cervical back: Normal range of motion.  Skin:    General: Skin is warm and dry.  Neurological:     Mental Status: He is alert.        LABORATORY DATA: I have reviewed the data as listed    Latest Ref Rng & Units 09/19/2023    1:03 PM 09/03/2023    8:55 AM 08/22/2023    2:55 PM  CBC  WBC 4.0 - 10.5 K/uL 9.5  9.2  6.1   Hemoglobin 13.0 - 17.0 g/dL 95.2  84.1  32.4   Hematocrit 39.0 - 52.0 % 43.6  48.7  43.9   Platelets 150 - 400 K/uL 242  317  207         Latest Ref Rng & Units 09/19/2023    1:03 PM 09/03/2023    8:55 AM 08/22/2023    2:55 PM  CMP  Glucose 70 - 99 mg/dL 401  027  253   BUN 6 - 20 mg/dL 11  14  15    Creatinine 0.61 - 1.24 mg/dL 6.64  4.03  4.74   Sodium 135 - 145 mmol/L 139  140  139   Potassium 3.5 - 5.1 mmol/L 3.9  4.3  4.3   Chloride 98 - 111 mmol/L 105  105  106   CO2 22 - 32 mmol/L 27  25  29  Calcium 8.9 - 10.3 mg/dL 9.4  16.1  9.4   Total Protein 6.5 - 8.1 g/dL 8.0  7.9  7.2   Total Bilirubin 0.0 - 1.2 mg/dL 0.5  0.4  0.5   Alkaline Phos 38 - 126 U/L 61  68  56   AST 15 - 41 U/L 38  29  32   ALT 0 - 44 U/L 46  30  34        RADIOGRAPHIC STUDIES (from last 24 hours if applicable) I have personally reviewed the radiological images as listed and agreed with the findings in the report. No results found.      Visit Diagnosis: 1. 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 Flank pain      No orders of the defined types were placed in this encounter.   All questions were answered. The patient knows to call the clinic with any problems, questions or concerns. No barriers to learning was detected.  A total of more than 30  minutes were spent on this encounter with face-to-face time and non-face-to-face time, including preparing to see the patient, ordering tests and/or medications, counseling the patient and coordination of care as outlined above.    Thank you for allowing me to participate in the care of this patient.    Shanon Ace, PA-C Department of Hematology/Oncology [facility] Phone: [phone]  [phone]    09/19/2023 3:45 PM

## 2023-09-19 NOTE — Assessment & Plan Note (Signed)
 09/15/2021:Palpable left breast mass x8 months. CT 08/27/2021 in ED showed 3.8 cm left breast mass with left axillary lymph nodes.Mamm and [place]: 4 cm mass with 5 axillary lymph nodes: Biopsy: Grade 2 IDC, lymph node: Positive, ER 100%, PR 50%, Ki-67 25%, HER2 negative ratio 1.25, copy #3.45 (Recent history of coronary artery disease EF 22% and stroke)   Treatment plan: 1. Neoadjuvant chemotherapy with Taxotere and Cytoxan every 3 weeks x6 cycles completed 01/16/2022 2. left mastectomy: Residual grade 2 IDC 3.5 cm, margins negative, 2/2 lymph nodes positive, angiolymphatic invasion present, ER 100%, PR 50%, HER2 negative, Ki-67 25% 3. Followed by adjuvant radiation therapy 04/03/22-05/16/22 4.  Followed by adjuvant antiestrogen therapy with tamoxifen started November 2023 5. 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 twice daily)  Verzinio toxicities: Severe fatigue: Because of that I have reduced the dosage from 1 5200 twice daily. He is going to [place] for several weeks and will take the reduced dosage.  We discussed with him that if he cannot tolerate the current treatment then we may switch him from Verzinio to Faslodex.  However I believe that he is tolerating it well so we do not need to make any changes other than reducing the dose today.

## 2023-09-19 NOTE — Patient Instructions (Signed)
 Marland Kitchen

## 2023-09-19 NOTE — Progress Notes (Signed)
 HEMATOLOGY-ONCOLOGY TELEPHONE VISIT PROGRESS NOTE  I connected with our patient on 09/19/23 at  3:30 PM EDT by telephone and verified that I am speaking with the correct person using two identifiers.  I discussed the limitations, risks, security and privacy concerns of performing an evaluation and management service by telephone and the availability of in person appointments.  I also discussed with the patient that there may be a patient responsible charge related to this service. The patient expressed understanding and agreed to proceed.   History of Present Illness: Fatigue due to Verzenio History of Present Illness The patient, with a history of cancer, presents with fatigue and a kidney stone. He woke up at 3 AM with kidney stone pain, a condition he has experienced before. He was prescribed Flomax to help pass the stone.  The patient is currently on Verzenio for his cancer treatment. He reports significant fatigue since starting the medication, stating he "doesn't have the energy to do anything but get up and go to work and then come back home and lay down." The fatigue was less severe when he was on a lower dose of the medication.  The patient also mentions he will be out of the country soon and has arranged for someone to pick up his next month's prescription. He has enough medication to last until his next cycle, but there may be a discrepancy in the dosage as he has leftover medication from a previous prescription.  In addition to [facility], the patient is also on tamoxifen. He reports no noticeable side effects from this medication.    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 CT 08/27/2021 in ED showed 3.8 centimeter left breast mass with left axillary lymph nodes.Mamm and [place]: 4 cm mass with 5 axillary lymph nodes: Biopsy: Grade 2 IDC, lymph  node: Positive, ER 100%, PR 50%, Ki-67 25%, HER2 negative ratio 1.25, copy #3.45   09/25/2021 Cancer Staging   Staging form: Breast, AJCC 8th Edition - Clinical: Stage IIA (cT2, cN1, cM0, G2, ER+, PR+, HER2-) - Signed by Serena Croissant, MD on 09/25/2021 Histologic grading system: 3 grade system   10/03/2021 - 01/18/2022 Chemotherapy   Patient is on Treatment Plan : BREAST TC q21d     01/22/2022 Genetic Testing   Negative genetic testing on the CancerNext-Expanded+RNAinsight.  The report date is January 22, 2022.  The CancerNext-Expanded gene panel offered by [facility] and includes sequencing and rearrangement analysis for the following 77 genes: AIP, ALK, APC*, ATM*, AXIN2, BAP1, BARD1, BLM, BMPR1A, BRCA1*, BRCA2*, BRIP1*, CDC73, CDH1*, CDK4, CDKN1B, CDKN2A, CHEK2*, CTNNA1, DICER1, FANCC, FH, FLCN, GALNT12, KIF1B, LZTR1, MAX, MEN1, MET, MLH1*, MSH2*, MSH3, MSH6*, MUTYH*, NBN, NF1*, NF2, NTHL1, PALB2*, PHOX2B, PMS2*, POT1, PRKAR1A, PTCH1, PTEN*, RAD51C*, RAD51D*, RB1, RECQL, RET, SDHA, SDHAF2, SDHB, SDHC, SDHD, SMAD4, SMARCA4, SMARCB1, SMARCE1, STK11, SUFU, TMEM127, TP53*, TSC1, TSC2, VHL and XRCC2 (sequencing and deletion/duplication); EGFR, EGLN1, HOXB13, KIT, MITF, PDGFRA, POLD1, and POLE (sequencing only); EPCAM and GREM1 (deletion/duplication only). DNA and RNA analyses performed for * genes.      REVIEW OF SYSTEMS:   Constitutional: Denies fevers, chills or abnormal weight loss All other systems were reviewed with the patient and are negative. 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 CT 08/27/2021 in  ED showed 3.8 cm left breast mass with left axillary lymph nodes.Mamm and [place]: 4 cm mass with 5 axillary lymph nodes: Biopsy: Grade 2 IDC, lymph node: Positive, ER 100%, PR 50%, Ki-67 25%, HER2 negative ratio 1.25, copy #3.45 (Recent history of  coronary artery disease EF 22% and stroke)   Treatment plan: 1. Neoadjuvant chemotherapy with Taxotere and Cytoxan every 3 weeks x6 cycles completed 01/16/2022 2. left mastectomy: Residual grade 2 IDC 3.5 cm, margins negative, 2/2 lymph nodes positive, angiolymphatic invasion present, ER 100%, PR 50%, HER2 negative, Ki-67 25% 3. Followed by adjuvant radiation therapy 04/03/22-05/16/22 4.  Followed by adjuvant antiestrogen therapy with tamoxifen started November 2023 5. 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 twice daily)  Verzinio toxicities: Severe fatigue: Because of that I have reduced the dosage from 1 5200 twice daily. He is going to [place] for several weeks and will take the reduced dosage.  We discussed with him that if he cannot tolerate the current treatment then we may switch him from Verzinio to Faslodex.  However I believe that he is tolerating it well so we do not need to make any changes other than reducing the dose today.  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 Liver enzymes and kidney function mildly elevated. Decision to reduce Verzenio dose to improve  tolerability. Discussed potential switch to Faslodex. Prefers to continue tamoxifen. - Reduce Verzenio dose to 100 mg twice daily. - Continue tamoxifen as current. - Consider Faslodex if Verzenio intolerable. - Provide Verzenio 100 mg samples if needed. - Send new prescription for Verzenio 100 mg.  Fatigue Attributed to Verzenio dose. Affects functionality. Less severe on lower dose. - Reduce Verzenio dose to 100 mg twice daily to improve fatigue.  Nephrolithiasis Experienced kidney stone episode. Prescribed Flomax for stone passage. - Continue Flomax as prescribed.  Renal function Mild changes consistent with previous results. Current treatment regimen well tolerated.  Follow-up Traveling to the [place] for work. Has enough medication for trip. - Schedule follow-up appointment with labs in one month.      I discussed the assessment and treatment plan with the patient. The patient was provided an opportunity to ask questions and all were answered. The patient agreed with the plan and demonstrated an understanding of the instructions. The patient was advised to call back or seek an in-person evaluation if the symptoms worsen or if the condition fails to improve as anticipated.   I provided 20 minutes of non-face-to-face time during this encounter.  This includes time for charting and coordination of care   Tamsen Meek, MD

## 2023-09-19 NOTE — Telephone Encounter (Signed)
 Received call from pt with complaint of left lower abdomen pain and burning sensation with urination.  Per MD pt needing [facility] visit for further evaluation.  Appt scheduled, pt notified and verbalized understanding.

## 2023-09-20 ENCOUNTER — Telehealth (HOSPITAL_COMMUNITY): Payer: Self-pay | Admitting: Cardiology

## 2023-09-20 ENCOUNTER — Encounter: Payer: Self-pay | Admitting: Hematology and Oncology

## 2023-09-20 ENCOUNTER — Telehealth: Payer: Self-pay | Admitting: Hematology and Oncology

## 2023-09-20 LAB — URINE CULTURE: Culture: NO GROWTH

## 2023-09-20 NOTE — Progress Notes (Signed)
 Encounter for IVF and IV medications

## 2023-09-20 NOTE — Telephone Encounter (Signed)
Scheduled appointments per 3/13 los. Patient is aware of the made appointments.

## 2023-09-23 ENCOUNTER — Ambulatory Visit (HOSPITAL_COMMUNITY)
Admission: RE | Admit: 2023-09-23 | Discharge: 2023-09-23 | Disposition: A | Discharge status: Home / Self Care | Payer: BC Managed Care – PPO | Source: Ambulatory Visit | Attending: Cardiology | Admitting: Cardiology

## 2023-09-23 ENCOUNTER — Telehealth (HOSPITAL_COMMUNITY): Payer: Self-pay

## 2023-09-23 ENCOUNTER — Encounter: Payer: Self-pay | Admitting: Hematology and Oncology

## 2023-09-23 ENCOUNTER — Other Ambulatory Visit (HOSPITAL_COMMUNITY): Payer: Self-pay

## 2023-09-23 VITALS — BP 138/90 | HR 103 | Ht 75.0 in | Wt 313.8 lb

## 2023-09-23 DIAGNOSIS — Z7901 Long term (current) use of anticoagulants: Secondary | ICD-10-CM | POA: Diagnosis not present

## 2023-09-23 DIAGNOSIS — Z8673 Personal history of transient ischemic attack (TIA), and cerebral infarction without residual deficits: Secondary | ICD-10-CM | POA: Diagnosis not present

## 2023-09-23 DIAGNOSIS — Z853 Personal history of malignant neoplasm of breast: Secondary | ICD-10-CM | POA: Diagnosis not present

## 2023-09-23 DIAGNOSIS — I5022 Chronic systolic (congestive) heart failure: Secondary | ICD-10-CM | POA: Insufficient documentation

## 2023-09-23 DIAGNOSIS — Z9012 Acquired absence of left breast and nipple: Secondary | ICD-10-CM | POA: Insufficient documentation

## 2023-09-23 DIAGNOSIS — Z1732 Human epidermal growth factor receptor 2 negative status: Secondary | ICD-10-CM | POA: Insufficient documentation

## 2023-09-23 DIAGNOSIS — Z79810 Long term (current) use of selective estrogen receptor modulators (SERMs): Secondary | ICD-10-CM | POA: Diagnosis not present

## 2023-09-23 DIAGNOSIS — I5043 Acute on chronic combined systolic (congestive) and diastolic (congestive) heart failure: Secondary | ICD-10-CM | POA: Diagnosis not present

## 2023-09-23 DIAGNOSIS — Z87891 Personal history of nicotine dependence: Secondary | ICD-10-CM | POA: Insufficient documentation

## 2023-09-23 DIAGNOSIS — C770 Secondary and unspecified malignant neoplasm of lymph nodes of head, face and neck: Secondary | ICD-10-CM | POA: Insufficient documentation

## 2023-09-23 DIAGNOSIS — Z79899 Other long term (current) drug therapy: Secondary | ICD-10-CM | POA: Diagnosis not present

## 2023-09-23 MED ORDER — ENTRESTO 49-51 MG PO TABS: 1.0000 | ORAL_TABLET | Freq: Two times a day (BID) | ORAL | 2 refills | Status: AC

## 2023-09-23 MED ORDER — DAPAGLIFLOZIN PROPANEDIOL 10 MG PO TABS: 10.0000 mg | ORAL_TABLET | Freq: Every day | ORAL | 3 refills | Status: AC

## 2023-09-23 MED ORDER — DAPAGLIFLOZIN PROPANEDIOL 10 MG PO TABS: 10.0000 mg | ORAL_TABLET | Freq: Every day | ORAL | Status: DC

## 2023-09-23 MED ORDER — EPLERENONE 50 MG PO TABS: 50.0000 mg | ORAL_TABLET | Freq: Every day | ORAL | 3 refills | Status: AC

## 2023-09-23 MED ORDER — ENTRESTO 49-51 MG PO TABS: 1.0000 | ORAL_TABLET | Freq: Two times a day (BID) | ORAL | Status: DC

## 2023-09-23 NOTE — Patient Instructions (Signed)
 Great to see you today!!!  Medication Changes:  START Entresto 49/51 mg Twice daily   START Eplerenone 50 mg Daily  START Farxiga 10 mg Daily  Lab Work:  Your physician recommends that you return for a FASTING lipid profile: 2 weeks  Testing/Procedures:  Your physician has requested that you have an echocardiogram. Echocardiography is a painless test that uses sound waves to create images of your heart. It provides your doctor with information about the size and shape of your heart and how well your heart's chambers and valves are working. This procedure takes approximately one hour. There are no restrictions for this procedure. Please do NOT wear cologne, perfume, aftershave, or lotions (deodorant is allowed). Please arrive 15 minutes prior to your appointment time.  Please note: We ask at that you not bring children with you during ultrasound (echo/ vascular) testing. Due to room size and safety concerns, children are not allowed in the ultrasound rooms during exams. Our front office staff cannot provide observation of children in our lobby area while testing is being conducted. An adult accompanying a patient to their appointment will only be allowed in the ultrasound room at the discretion of the ultrasound technician under special circumstances. We apologize for any inconvenience.   Special Instructions // Education:  Do the following things EVERYDAY: Weigh yourself in the morning before breakfast. Write it down and keep it in a log. Take your medicines as prescribed Eat low salt foods--Limit salt (sodium) to 2000 mg per day.  Stay as active as you can everyday Limit all fluids for the day to less than 2 liters   Follow-Up in: 4-6 weeks   At the [facility], you and your health needs are our priority. We have a designated team specialized in the treatment of Heart Failure. This Care Team includes your primary Heart Failure Specialized Cardiologist  (physician), Advanced Practice Providers (APPs- Physician Assistants and Nurse Practitioners), and Pharmacist who all work together to provide you with the care you need, when you need it.   You may see any of the following providers on your designated Care Team at your next follow up:  Dr. Arvilla Meres Dr. Marca Ancona Dr. Dorthula Nettles Dr. Theresia Bough Tonye Becket, NP Robbie Lis, [place] [facility] [place], [place] Brynda Peon, NP [place] Lee, NP Karle Plumber, PharmD   Please be sure to bring in all your medications bottles to every appointment.   Need to Contact us:  If you have any questions or concerns before your next appointment please send [place] a message through [place] or call our office at [phone].    TO LEAVE A MESSAGE FOR THE NURSE SELECT OPTION 2, PLEASE LEAVE A MESSAGE INCLUDING: YOUR NAME DATE OF BIRTH CALL BACK NUMBER REASON FOR CALL**this is important as we prioritize the call backs  YOU WILL RECEIVE A CALL BACK THE SAME DAY AS LONG AS YOU CALL BEFORE 4:00 PM

## 2023-09-23 NOTE — Telephone Encounter (Signed)
 Advanced Heart Failure Patient Advocate Encounter  Prior authorization for Marcelline Deist has been submitted and approved. Test billing returns $50 for 30 day supply. This insurance limits a 30 day supply at retail level, and this patient is eligible to use a copay savings card with this medication.  Key: [id number] Effective: 08/24/2023 to 09/22/2024  Burnell Blanks, CPhT Rx Patient Advocate Phone: [phone]

## 2023-09-23 NOTE — Progress Notes (Signed)
 ADV[facility]ED [facility] CLINIC NOTE Primary Care: Pcp, No HF Cardiologist: Dr. Shirlee Latch  Chief complaint: CHF  HPI: Michael Stuart is a 43 y.o. male with minimal previous past medical history, and diagnosis of systolic heart failure in 2/23 with LV thrombus and new diagnosis of breast cancer.  Admitted 2/23 with new systolic heart failure. Echo showed dilated LV with EF 20-25%.  LHC/RHC was done, showing elevated filling pressures but relatively preserved cardiac index of 2.3.  There was no significant coronary disease. cMRI showed LV dilation with EF 10%, LV thrombus, mildly dilated RV with EF 15%.  Delayed enhancement imaging showed inferolateral LGE that could be a OM territory infarct, but corresponding lesion not seen by cath. Most likely acute myocarditis given subepicardial apical inferior LGE also (the RV insertion site LGE can be nonspecific in the setting of volume/pressure overload). Elevated T2 in the apical inferior wall was more suggestive of acute myocarditis. 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 He was started on Coumadin for thrombus. He had transient LOC after MRI and was orthostatic, felt to be vagally-mediated. Also noted to have breast mass and new axillary lymphadenopathy, suspect breast cancer. Patient was diuresed and started on GDMT.  He was discharged with LifeVest with oncology follow up.  Readmitted 1 day after discharge with stroke-like symptoms of new left facial numbness, left facial drop and vision changes. INR 1.5 on admission, patient compliant with Lovenox bridging since discharge. CT head negative, MRI showed patchy acute and subacute infarcts. Neurology consulted and recommended transitioning to DOAC given stroke in spite of being on warfarin/Lovenox. Deficits resolved and he was discharged home, weight 254 lbs.  He had left mastectomy for breast cancer in 8/23 and has completed taxotere/Cytoxan chemotherapy.    Echo was done in 5/23, this showed EF 50-55%, mild MR, no LV thrombus.  Anticoagulation was stopped with improvement in LV function. Echo in 4/24 showed EF 50-55%, mild LVH.   In 1/25, he was found to have an enlarged supraclavicular node.  Left neck dissection showed 3/19 nodes positive for metastatic breast CA.  He is now on Tamoxifen and Verzinio.   Today, he returns for HF follow up with his mother.  Verzinio fatigues him.  However, no exertional dyspnea, orthopnea, or PND.  No chest pain. He has been off Entresto and eplerenone for months.  BP is elevated.   Labs (2/23): K 4.9, creatinine 0.96, hgb 16.2 Labs (4/23): K 4.0, creatinine 0.80, hgb 13.5 Labs (8/23): K 4.2, creatinine 0.82 Labs (3/25): K 3.9, creatinine 1.3  PMH: 1. 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  s/p left mastectomy in 8/23 with chemotherapy with taxotere/Cytoxan and radiation.  - 2/25 left neck dissection showed 3/19 nodes positive for metastatic breast cancer.  2. 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   - Echo (2/23): dilated LV with EF 20-25%. - R/LHC (2/23): Elevated filling pressures but relatively preserved cardiac index of 2.3.  There was no significant coronary disease.  - cMRI (2/23): LV dilation with LVEF 10%, LV thrombus, mildly dilated RV with RVEF 15%.  Delayed enhancement imaging showed inferolateral LGE that could be a OM territory infarct, but corresponding lesion not seen by cath. Most likely acute myocarditis given subepicardial apical inferior LGE also (the RV insertion site LGE can be nonspecific in the setting of volume/pressure overload). Elevated T2 in the apical inferior wall is more suggestive of acute myocarditis. - Echo (5/23): EF 50-55%, mild MR, no LV thrombus - Echo (4/24): EF 50-55%, mild LVH.  3. LV thrombus: Resolved on 5/23 echo.  4. CVA: 2/23, in setting of LV thrombus.     Current Outpatient Medications  Medication Sig Dispense Refill   abemaciclib (VERZENIO) 100 MG tablet Take 1 tablet (100 mg total) by mouth 2  (two) times daily. 60 tablet 6   atorvastatin (LIPITOR) 20 MG tablet TAKE 1 TABLET BY MOUTH EVERY DAY 30 tablet 0   diphenhydramine-acetaminophen (TYLENOL PM) 25-500 MG TABS tablet Take 3 tablets by mouth at bedtime.     metoprolol succinate (TOPROL-XL) 50 MG 24 hr tablet Take 1 tablet (50 mg total) by mouth daily. 90 tablet 3   ondansetron (ZOFRAN-ODT) 4 MG disintegrating tablet Take 1 tablet (4 mg total) by mouth every 8 (eight) hours as needed for nausea or vomiting. 20 tablet 0   Pyridoxine HCl (VITAMIN B-6 PO) Take 1 tablet by mouth 2 (two) times daily.     tamoxifen (NOLVADEX) 20 MG tablet TAKE 1 TABLET(20 MG) BY MOUTH DAILY 90 tablet 3   dapagliflozin propanediol (FARXIGA) 10 MG TABS tablet Take 1 tablet (10 mg total) by mouth daily before breakfast. 90 tablet 3   eplerenone (INSPRA) 50 MG tablet Take 1 tablet by mouth daily. 90 tablet 3   gabapentin (NEURONTIN) 100 MG capsule Take 1 capsule (100 mg total) by mouth 3 (three) times daily. (Patient not taking: Reported on 09/23/2023) 90 capsule 3   HYDROcodone-acetaminophen (NORCO/VICODIN) 5-325 MG tablet Take 1 tablet by mouth every 6 (six) hours as needed for up to 5 days for severe pain (pain score 7-10). (Patient not taking: Reported on 09/23/2023) 20 tablet 0   sacubitril-valsartan (ENTRESTO) 49-51 MG Take 1 tablet by mouth 2 (two) times daily. 60 tablet 2   tamsulosin (FLOMAX) 0.4 MG CAPS capsule Take 1 capsule (0.4 mg total) by mouth daily for 10 days. (Patient not taking: Reported on 09/23/2023) 10 capsule 0   No current facility-administered medications for this encounter.   Allergies  Allergen Reactions   Bactrim [Sulfamethoxazole-Trimethoprim] Other (See Comments)    Blood capillaries rupture    Ovace Plus Wash [Sulfacetamide Sodium] Other (See Comments)    Blood capillaries rupture    Social History   Socioeconomic History   Marital status: Married    Spouse name: Not on file   Number of children: 2   Years of education:  Not on file   Highest education level: Bachelor's degree (e.g., BA, AB, BS)  Occupational History   Occupation: crash Manufacturing systems engineer    Employer: [employer]  Tobacco Use   Smoking status: Former    Current packs/day: 0.00    Average packs/day: 0.3 packs/day for 25.0 years (6.3 ttl pk-yrs)    Types: Cigarettes    Start date: 10    Quit date: 2022    Years since quitting: 3.2   Smokeless tobacco: Former   Tobacco comments:    Smoked 2PPD x 10 years. Last 13 years 3-4 cigarettes/day.   Vaping Use   Vaping status: Former   Substances: Nicotine  Substance and Sexual Activity   Alcohol use: Yes    Alcohol/week: 21.0 standard drinks of alcohol    Types: 21 Cans of beer per week    Comment: 2 tall boys per day, everyday x 20years   Drug use: Not Currently    Frequency: 1.0 times per week    Types: Marijuana    Comment: 3x per month   Sexual activity: Not Currently  Other Topics Concern   Not on file  Social History Narrative   Right handed    Social Drivers of Health   Financial Resource Strain: Low Risk  (08/29/2021)   Overall Financial Resource Strain (CARDIA)    Difficulty of Paying Living Expenses: Not very hard  Food Insecurity: No Food Insecurity (08/23/2023)   Hunger Vital Sign    Worried About Running Out of Food in the Last Year: Never true    Ran Out of Food in the Last Year: Never true  Transportation Needs: No Transportation Needs (08/23/2023)   PRAPARE - Administrator, Civil Service (Medical): No    Lack of Transportation (Non-Medical): No  Physical Activity: Not on file  Stress: Not on file  Social Connections: Unknown (11/09/2021)   [facility]   Social Network    Social Network: Not on file  Intimate Partner Violence: Not At Risk (08/23/2023)   Humiliation, Afraid, Rape, and Kick questionnaire    Fear of Current or Ex-Partner: No    Emotionally Abused: No    Physically Abused: No    Sexually Abused: No   Family  History  Problem Relation Age of Onset   Healthy Mother    BP (!) 138/90   Pulse (!) 103   Ht 6\' 3"  (1.905 m)   Wt (!) 142.3 kg (313 lb 12.8 oz)   SpO2 96%   BMI 39.22 kg/m   Wt Readings from Last 3 Encounters:  09/23/23 (!) 142.3 kg (313 lb 12.8 oz)  09/19/23 (!) 143.3 kg (316 lb)  08/23/23 (!) 145.6 kg (321 lb)   PHYSICAL EXAM: General: NAD Neck: No JVD, no thyromegaly or thyroid nodule.  Lungs: Clear to auscultation bilaterally with normal respiratory effort. CV: Nondisplaced PMI.  Heart regular S1/S2, no S3/S4, no murmur.  No peripheral edema.  No carotid bruit.  Normal pedal pulses.  Abdomen: Soft, nontender, no hepatosplenomegaly, no distention.  Skin: Intact without lesions or rashes.  Neurologic: Alert and oriented x 3.  Psych: Normal affect. Extremities: No clubbing or cyanosis.  HEENT: Normal.   ASSESSMENT & PLAN: 1. Chronic Systolic CHF: Patient presented in 2/23 with CHF after a viral-type illness. HIV, TSH, COVID-19 test, and UDS were all negative. I do not think he was drinking enough ETOH to cause CMP. Patient has no family history of cardiomyopathy.  LHC/RHC showed no significant CAD, elevated filling pressures and relatively preserved cardiac output. Echo in 2/23 showed EF 20-25%. Cardiac MRI in 2/23 showed severe LV dilation with EF 10%, LV thrombus, mildly dilated RV with EF 15%.  Delayed enhancement imaging showed inferolateral LGE that could be a OM territory infarct, but corresponding lesion not seen by cath. Overall, most likely acute myocarditis given subepicardial apical inferior LGE also (the RV insertion site LGE can be nonspecific in the setting of volume/pressure overload). Elevated T2 in the apical inferior wall was more suggestive of acute myocarditis. 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 Echo in 5/23 and again in 4/24 showed EF up to 50-55%.  He has been off Entresto and eplerenone and BP is  elevated.  NYHA class II symptoms with fatigue, not volume overloaded on exam.   - Will arrange repeat echo - I will have him restart Entresto at 49/51 bid and eplerenone at 50 mg daily.  BMET in 2 wks.  - Continue Toprol XL 50 mg daily.   - Continue Farxiga 10 mg daily.  2. LV thrombus: With recovery of EF,  anticoagulation was stopped.  3. 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  Transitioned to Eliquis.  Anticoagulation stopped after 5/23 echo showed improved LV function.  4. Breast Cancer: Left breast CA, s/p mastectomy in 8/23.  He has completed course of taxotere and cyotoxan as well as radiation. However, now metastatic to neck lymph nodes.  He is on Verzinio.    Followup in 1 month with APP to make sure he has tolerating restarting meds and review echo.   I spent 32 minutes reviewing records, interviewing/examining patient, and managing orders.   Marca Ancona. 09/23/2023

## 2023-09-23 NOTE — Progress Notes (Signed)
 Medication Samples have been provided to the patient.  Drug name: Sherryll Burger       Strength: 49/51 mg        Qty: 2  LOT: [id number]  Exp.Date: 04/2025  Dosing instructions: Take 1 tab Twice daily   The patient has been instructed regarding the correct time, dose, and frequency of taking this medication, including desired effects and most common side effects.   Jahmiyah Dullea 2:25 PM 09/23/2023   Medication Samples have been provided to the patient.  Drug name: Marcelline Deist       Strength: 10 mg        Qty: 4  LOT: [id number]  Exp.Date: 04/07/26, 12/06/25  Dosing instructions: take 1 tab daily  The patient has been instructed regarding the correct time, dose, and frequency of taking this medication, including desired effects and most common side effects.   Jaedan Huttner 2:26 PM 09/23/2023

## 2023-10-07 ENCOUNTER — Ambulatory Visit (HOSPITAL_COMMUNITY)
Admission: RE | Admit: 2023-10-07 | Discharge: 2023-10-07 | Disposition: A | Discharge status: Home / Self Care | Source: Ambulatory Visit | Attending: Internal Medicine | Admitting: Internal Medicine

## 2023-10-07 DIAGNOSIS — I5022 Chronic systolic (congestive) heart failure: Secondary | ICD-10-CM | POA: Diagnosis not present

## 2023-10-07 LAB — BASIC METABOLIC PANEL WITH GFR
Anion gap: 10 (ref 5–15)
BUN: 7 mg/dL (ref 6–20)
CO2: 24 mmol/L (ref 22–32)
Calcium: 9.4 mg/dL (ref 8.9–10.3)
Chloride: 107 mmol/L (ref 98–111)
Creatinine, Ser: 1.11 mg/dL (ref 0.61–1.24)
GFR, Estimated: >60 mL/min (ref >60)
Glucose, Bld: 127 mg/dL — ABNORMAL HIGH (ref 70–99)
Potassium: 4.2 mmol/L (ref 3.5–5.1)
Sodium: 141 mmol/L (ref 135–145)

## 2023-10-07 LAB — LIPID PANEL
Cholesterol: 95 mg/dL (ref 0–200)
HDL: 39 mg/dL — ABNORMAL LOW (ref >40)
LDL Cholesterol: 28 mg/dL (ref 0–99)
Total CHOL/HDL Ratio: 2.4 ratio
Triglycerides: 142 mg/dL (ref <150)
VLDL: 28 mg/dL (ref 0–40)

## 2023-10-15 ENCOUNTER — Ambulatory Visit (HOSPITAL_COMMUNITY)
Admission: RE | Admit: 2023-10-15 | Discharge: 2023-10-15 | Disposition: A | Discharge status: Home / Self Care | Source: Ambulatory Visit | Attending: Cardiology | Admitting: Cardiology

## 2023-10-15 DIAGNOSIS — I5022 Chronic systolic (congestive) heart failure: Secondary | ICD-10-CM | POA: Insufficient documentation

## 2023-10-15 DIAGNOSIS — Z87891 Personal history of nicotine dependence: Secondary | ICD-10-CM | POA: Diagnosis not present

## 2023-10-15 LAB — ECHOCARDIOGRAM COMPLETE
AR max vel: 2.9 cm2
AV Area VTI: 3.01 cm2
AV Area mean vel: 2.82 cm2
AV Mean grad: 2 mmHg
AV Peak grad: 3.8 mmHg
Ao pk vel: 0.97 m/s
Area-P 1/2: 5.54 cm2
Calc EF: 34 %
S' Lateral: 4.8 cm
Single Plane A2C EF: 29.8 %
Single Plane A4C EF: 37.6 %

## 2023-10-16 ENCOUNTER — Telehealth (HOSPITAL_COMMUNITY): Payer: Self-pay | Admitting: *Deleted

## 2023-10-16 NOTE — Telephone Encounter (Signed)
 Called patient per Dr. Shirlee Latch with following echo results and instructions:  "Echo showed EF down to 30-35%, he had been off both Entresto and eplerenone and has now restarted.  Will need to titrate meds and repeat echo in 4-6 months.  Make sure he has followup soon in the office."  Pt confirmed he has re-started medications as instructed and verbalized understanding of above. Confirmed his f/u appointment on 11/04/23 and he plans on keeping this appointment for close follow up.

## 2023-10-20 ENCOUNTER — Other Ambulatory Visit: Payer: Self-pay | Admitting: Cardiology

## 2023-10-21 ENCOUNTER — Inpatient Hospital Stay: Admitting: Hematology and Oncology

## 2023-10-21 ENCOUNTER — Inpatient Hospital Stay: Attending: Hematology and Oncology

## 2023-10-21 VITALS — BP 131/95 | HR 104 | Temp 97.8°F | Resp 18 | Ht 75.0 in | Wt 313.8 lb

## 2023-10-21 DIAGNOSIS — Z170 Estrogen receptor positive status [ER+]: Secondary | ICD-10-CM | POA: Diagnosis not present

## 2023-10-21 DIAGNOSIS — Z79810 Long term (current) use of selective estrogen receptor modulators (SERMs): Secondary | ICD-10-CM | POA: Insufficient documentation

## 2023-10-21 DIAGNOSIS — Z1732 Human epidermal growth factor receptor 2 negative status: Secondary | ICD-10-CM | POA: Diagnosis not present

## 2023-10-21 DIAGNOSIS — C770 Secondary and unspecified malignant neoplasm of lymph nodes of head, face and neck: Secondary | ICD-10-CM | POA: Insufficient documentation

## 2023-10-21 DIAGNOSIS — Z1721 Progesterone receptor positive status: Secondary | ICD-10-CM | POA: Insufficient documentation

## 2023-10-21 DIAGNOSIS — Z79899 Other long term (current) drug therapy: Secondary | ICD-10-CM | POA: Insufficient documentation

## 2023-10-21 DIAGNOSIS — C50422 Malignant neoplasm of upper-outer quadrant of left male breast: Secondary | ICD-10-CM

## 2023-10-21 LAB — CMP (CANCER CENTER ONLY)
ALT: 38 U/L (ref 0–44)
AST: 36 U/L (ref 15–41)
Albumin: 4.6 g/dL (ref 3.5–5.0)
Alkaline Phosphatase: 69 U/L (ref 38–126)
Anion gap: 9 (ref 5–15)
BUN: 11 mg/dL (ref 6–20)
CO2: 26 mmol/L (ref 22–32)
Calcium: 9.4 mg/dL (ref 8.9–10.3)
Chloride: 104 mmol/L (ref 98–111)
Creatinine: 1.27 mg/dL — ABNORMAL HIGH (ref 0.61–1.24)
GFR, Estimated: >60 mL/min (ref >60)
Glucose, Bld: 107 mg/dL — ABNORMAL HIGH (ref 70–99)
Potassium: 4.4 mmol/L (ref 3.5–5.1)
Sodium: 139 mmol/L (ref 135–145)
Total Bilirubin: 0.3 mg/dL (ref 0.0–1.2)
Total Protein: 7.6 g/dL (ref 6.5–8.1)

## 2023-10-21 LAB — CBC WITH DIFFERENTIAL (CANCER CENTER ONLY)
Abs Immature Granulocytes: 0.02 10*3/uL (ref 0.00–0.07)
Basophils Absolute: 0.1 10*3/uL (ref 0.0–0.1)
Basophils Relative: 2 %
Eosinophils Absolute: 0.1 10*3/uL (ref 0.0–0.5)
Eosinophils Relative: 2 %
HCT: 44.6 % (ref 39.0–52.0)
Hemoglobin: 15 g/dL (ref 13.0–17.0)
Immature Granulocytes: 0 %
Lymphocytes Relative: 39 %
Lymphs Abs: 2.6 10*3/uL (ref 0.7–4.0)
MCH: 31.8 pg (ref 26.0–34.0)
MCHC: 33.6 g/dL (ref 30.0–36.0)
MCV: 94.7 fL (ref 80.0–100.0)
Monocytes Absolute: 0.6 10*3/uL (ref 0.1–1.0)
Monocytes Relative: 9 %
Neutro Abs: 3.3 10*3/uL (ref 1.7–7.7)
Neutrophils Relative %: 48 %
Platelet Count: 277 10*3/uL (ref 150–400)
RBC: 4.71 MIL/uL (ref 4.22–5.81)
RDW: 12.9 % (ref 11.5–15.5)
WBC Count: 6.8 10*3/uL (ref 4.0–10.5)
nRBC: 0 % (ref 0.0–0.2)

## 2023-10-21 MED ORDER — PREGABALIN 100 MG PO CAPS: 100.0000 mg | ORAL_CAPSULE | Freq: Two times a day (BID) | ORAL | 3 refills | Status: DC

## 2023-10-21 NOTE — Assessment & Plan Note (Signed)
 09/15/2021:Palpable left breast mass x8 months. CT 08/27/2021 in ED showed 3.8 cm left breast mass with left axillary lymph nodes.Mamm and US : 4 cm mass with 5 axillary lymph nodes: Biopsy: Grade 2 IDC, lymph node: Positive, ER 100%, PR 50%, Ki-67 25%, HER2 negative ratio 1.25, copy #3.45 (Recent history of coronary artery disease EF 22% and stroke)   Treatment plan: 1. Neoadjuvant chemotherapy with Taxotere and Cytoxan every 3 weeks x6 cycles completed 01/16/2022 2. left mastectomy: Residual grade 2 IDC 3.5 cm, margins negative, 2/2 lymph nodes positive, angiolymphatic invasion present, ER 100%, PR 50%, HER2 negative, Ki-67 25% 3. Followed by adjuvant radiation therapy 04/03/22-05/16/22 4.  Followed by adjuvant antiestrogen therapy with tamoxifen started November 2023 5. Signatera test positive (0.3)  06/26/2023: Bone scan: Negative, CT CAP: Enlarged left supraclavicular lymph node 2.1 cm, (used to be 0.8 cm) new tiny scattered nonspecific lung nodules (4 mm and 3 mm) -------------------------------------------------------------------------------------------------------------------- 07/16/2023: Biopsy left supraclavicular lymph node: Metastatic poorly differentiated carcinoma consistent with breast ER 75%, PR 75%, Ki67 40%, HER2 1+ 08/23/2023: Left neck dissection: 3/19 lymph nodes positive consistent with breast primary, extranodal extension on 3 lymph nodes, lymphovascular invasion identified   Current treatment: Tamoxifen with Verzinio (dose decreased to 100 p.o. twice daily)   Verzinio toxicities: Severe fatigue: Because of that I have reduced the dosage to 100 mg twice daily. He is going to [place] for several weeks and will take the reduced dosage.   We discussed with him that if he cannot tolerate the current treatment then we may switch him from Verzinio to Faslodex.  However I believe that he is tolerating it well so we do not need to make any changes other than reducing the dose today.

## 2023-10-21 NOTE — Progress Notes (Signed)
 Patient Care Team: Pcp, No as PCP - General Pershing Proud, RN as Oncology Nurse Navigator Donnelly Angelica, RN as Oncology Nurse Navigator Elwyn Reach (Neurology) Serena Croissant, MD as Medical Oncologist (Hematology and Oncology)  DIAGNOSIS:  Encounter Diagnosis  Name Primary?   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 CT 08/27/2021 in ED showed 3.8 centimeter left breast mass with left axillary lymph nodes.Mamm and [place]: 4 cm mass with 5 axillary lymph nodes: Biopsy: Grade 2 IDC, lymph node: Positive, ER 100%, PR 50%, Ki-67 25%, HER2 negative ratio 1.25, copy #3.45   09/25/2021 Cancer Staging   Staging form: Breast, AJCC 8th Edition - Clinical: Stage IIA (cT2, cN1, cM0, G2, ER+, PR+, HER2-) - Signed by Serena Croissant, MD on 09/25/2021 Histologic grading system: 3 grade system   10/03/2021 - 01/18/2022 Chemotherapy   Patient is on Treatment Plan : BREAST TC q21d     01/22/2022 Genetic Testing   Negative genetic testing on the CancerNext-Expanded+RNAinsight.  The report date is January 22, 2022.  The CancerNext-Expanded gene panel offered by [facility] and includes sequencing and rearrangement analysis for the following 77 genes: AIP, ALK, APC*, ATM*, AXIN2, BAP1, BARD1, BLM, BMPR1A, BRCA1*, BRCA2*, BRIP1*, CDC73, CDH1*, CDK4, CDKN1B, CDKN2A, CHEK2*, CTNNA1, DICER1, FANCC, FH, FLCN, GALNT12, KIF1B, LZTR1, MAX, MEN1, MET, MLH1*, MSH2*, MSH3, MSH6*, MUTYH*, NBN, NF1*, NF2, NTHL1, PALB2*, PHOX2B, PMS2*, POT1, PRKAR1A, PTCH1, PTEN*, RAD51C*, RAD51D*, RB1, RECQL, RET, SDHA, SDHAF2, SDHB, SDHC, SDHD, SMAD4, SMARCA4, SMARCB1, SMARCE1, STK11, SUFU, TMEM127, TP53*, TSC1, TSC2, VHL and XRCC2 (sequencing and  deletion/duplication); EGFR, EGLN1, HOXB13, KIT, MITF, PDGFRA, POLD1, and POLE (sequencing only); EPCAM and GREM1 (deletion/duplication only). DNA and RNA analyses performed for * genes.      CHIEF COMPLIANT: Follow-up on Verzenio  HISTORY OF PRESENT ILLNESS:   History of Present Illness Michael Stuart, a patient with a history of neuropathy, presents with worsening symptoms despite being on gabapentin. He describes the sensation as both numbness and pain, likening it to a needle being slowly stuck through all of his joints. These symptoms are particularly bothersome at night. He has tried taking up to four gabapentin at a time, but it has not provided any relief.  He discontinued gabapentin  In addition to his neuropathy, Michael Stuart has been on a regimen of Verzenio 100mg . He reports that this dosage is much more manageable than the previous 150mg , which left him feeling drained and wanting to lay down after work. At the current dosage, he feels he is able to have a life, though he is not running any marathons. His bowel movements have been fine on this medication.       ALLERGIES:  is allergic to bactrim [sulfamethoxazole-trimethoprim] and ovace plus wash [sulfacetamide sodium].  MEDICATIONS:  Current Outpatient Medications  Medication Sig Dispense Refill   abemaciclib (VERZENIO) 100 MG tablet Take 1 tablet (100 mg total) by mouth 2 (two) times daily. 60 tablet 6   atorvastatin (LIPITOR) 20 MG tablet TAKE 1 TABLET BY MOUTH EVERY DAY 30 tablet 0   dapagliflozin propanediol (FARXIGA) 10 MG TABS tablet Take 1 tablet (10 mg total) by mouth daily before breakfast. 90 tablet 3   diphenhydramine-acetaminophen (TYLENOL PM) 25-500 MG TABS tablet Take 3 tablets  by mouth at bedtime.     eplerenone (INSPRA) 50 MG tablet Take 1 tablet by mouth daily. 90 tablet 3   metoprolol succinate (TOPROL-XL) 50 MG 24 hr tablet Take 1 tablet (50 mg total) by mouth daily. 90 tablet 3   pregabalin (LYRICA) 100 MG capsule Take 1  capsule (100 mg total) by mouth 2 (two) times daily. 60 capsule 3   Pyridoxine HCl (VITAMIN B-6 PO) Take 1 tablet by mouth 2 (two) times daily.     sacubitril-valsartan (ENTRESTO) 49-51 MG Take 1 tablet by mouth 2 (two) times daily. 60 tablet 2   tamoxifen (NOLVADEX) 20 MG tablet TAKE 1 TABLET(20 MG) BY MOUTH DAILY 90 tablet 3   ondansetron (ZOFRAN-ODT) 4 MG disintegrating tablet Take 1 tablet (4 mg total) by mouth every 8 (eight) hours as needed for nausea or vomiting. (Patient not taking: Reported on 10/21/2023) 20 tablet 0   No current facility-administered medications for this visit.    PHYSICAL EXAMINATION: ECOG PERFORMANCE STATUS: 1 - Symptomatic but completely ambulatory  Vitals:   10/21/23 1544  BP: (!) 131/95  Pulse: (!) 104  Resp: 18  Temp: 97.8 F (36.6 C)  SpO2: 98%   Filed Weights   10/21/23 1544  Weight: (!) 313 lb 12.8 oz (142.3 kg)    Physical Exam Severe peripheral neuropathy  (exam performed in the presence of a chaperone)  LABORATORY DATA:  I have reviewed the data as listed    Latest Ref Rng & Units 10/07/2023    9:21 AM 09/19/2023    1:03 PM 09/03/2023    8:55 AM  CMP  Glucose 70 - 99 mg/dL 161  096  045   BUN 6 - 20 mg/dL 7  11  14    Creatinine 0.61 - 1.24 mg/dL 4.09  8.11  9.14   Sodium 135 - 145 mmol/L 141  139  140   Potassium 3.5 - 5.1 mmol/L 4.2  3.9  4.3   Chloride 98 - 111 mmol/L 107  105  105   CO2 22 - 32 mmol/L 24  27  25    Calcium 8.9 - 10.3 mg/dL 9.4  9.4  78.2   Total Protein 6.5 - 8.1 g/dL  8.0  7.9   Total Bilirubin 0.0 - 1.2 mg/dL  0.5  0.4   Alkaline Phos 38 - 126 U/L  61  68   AST 15 - 41 U/L  38  29   ALT 0 - 44 U/L  46  30     Lab Results  Component Value Date   WBC 6.8 10/21/2023   HGB 15.0 10/21/2023   HCT 44.6 10/21/2023   MCV 94.7 10/21/2023   PLT 277 10/21/2023   NEUTROABS 3.3 10/21/2023    ASSESSMENT & PLAN:  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 CT 08/27/2021 in ED showed 3.8 cm left breast mass with left axillary lymph nodes.Mamm and [place]: 4 cm mass with 5 axillary lymph nodes: Biopsy: Grade 2 IDC, lymph node: Positive, ER 100%, PR 50%, Ki-67 25%, HER2 negative ratio 1.25, copy #3.45 (Recent history of coronary artery disease EF 22% and stroke)   Treatment plan: 1. Neoadjuvant chemotherapy with Taxotere and Cytoxan every 3 weeks x6 cycles completed 01/16/2022 2. left mastectomy: Residual grade 2 IDC 3.5 cm, margins negative, 2/2 lymph nodes positive, angiolymphatic invasion present, ER 100%, PR 50%, HER2 negative, Ki-67 25% 3. Followed by adjuvant  radiation therapy 04/03/22-05/16/22 4.  Followed by adjuvant antiestrogen therapy with tamoxifen started November 2023 5. Signatera test positive (0.3)  06/26/2023: Bone scan: Negative, CT CAP: Enlarged left supraclavicular lymph node 2.1 cm, (used to be 0.8 cm) new tiny scattered nonspecific lung nodules (4 mm and 3 mm) -------------------------------------------------------------------------------------------------------------------- 07/16/2023: Biopsy left supraclavicular lymph node: Metastatic poorly differentiated carcinoma consistent with breast ER 75%, PR 75%, Ki67 40%, HER2 1+ 08/23/2023: Left neck dissection: 3/19 lymph nodes positive consistent with breast primary, extranodal extension on 3 lymph nodes, lymphovascular invasion identified   Current treatment: Tamoxifen with Verzinio (dose decreased to 100 p.o. twice daily)   Verzinio toxicities: Severe fatigue: Doing significantly better on Verzinio dosage of 100 mg twice daily. He will be away and another trip in 3 weeks.     Assessment & Plan   - Order CT scan of chest and abdomen in three weeks to monitor for metastasis. - Schedule Signatera test. - Continue Verzenio 100 mg oral twice daily.   Peripheral neuropathy Neuropathy in feet with pain and numbness. Gabapentin 500 mg  ineffective. Discussed alternative medications, prefers duloxetine first. Discussed supplemental options. - I sent a prescription for Lyrica 100 mg p.o. twice daily. - Discussed supplemental options such as menthol rubs, CBD oil, magnesium, B12, and PEA with luteolin.  We will Signatera testing. Return to clinic in 5 weeks for follow-up after scans.    Orders Placed This Encounter  Procedures   CT CHEST ABDOMEN PELVIS W CONTRAST    Standing Status:   Future    Expected Date:   11/18/2023    Expiration Date:   10/20/2024    If indicated for the ordered procedure, I authorize the administration of contrast media per Radiology protocol:   Yes    Does the patient have a contrast media/X-ray dye allergy?:   No    Preferred imaging location?:   [facility]    Release to patient:   Immediate    If indicated for the ordered procedure, I authorize the administration of oral contrast media per Radiology protocol:   Yes   The patient has a good understanding of the overall plan. he agrees with it. he will call with any problems that may develop before the next visit here. Total time spent: 30 mins including face to face time and time spent for planning, charting and co-ordination of care   Viinay K Hibo Blasdell, MD 10/21/23

## 2023-10-22 ENCOUNTER — Telehealth: Payer: Self-pay | Admitting: Hematology and Oncology

## 2023-10-22 NOTE — Telephone Encounter (Signed)
Called patient received no answer unable to leave vm mailbox full

## 2023-11-01 ENCOUNTER — Telehealth (HOSPITAL_COMMUNITY): Payer: Self-pay

## 2023-11-01 NOTE — Telephone Encounter (Signed)
 Called to confirm/remind patient of their appointment at [facility] on 11/04/23.   Appointment:   [] Confirmed  [] Left mess   [x] No answer/No voice mail  [] VM Full/unable to leave message  [] Phone not in service

## 2023-11-01 NOTE — Progress Notes (Signed)
 [facility] Primary Care: Pcp, No HF Cardiologist: Dr. Mitzie Anda  Chief complaint: CHF  HPI: Michael Stuart is a 43 y.o. male with minimal previous past medical history, and diagnosis of systolic heart failure in 2/23 with LV thrombus and new diagnosis of breast cancer.  Admitted 2/23 with new systolic heart failure. Echo showed dilated LV with EF 20-25%.  LHC/RHC was done, showing elevated filling pressures but relatively preserved cardiac index of 2.3.  There was no significant coronary disease. cMRI showed LV dilation with EF 10%, LV thrombus, mildly dilated RV with EF 15%.  Delayed enhancement imaging showed inferolateral LGE that could be a OM territory infarct, but corresponding lesion not seen by cath. Most likely acute myocarditis given subepicardial apical inferior LGE also (the RV insertion site LGE can be nonspecific in the setting of volume/pressure overload). Elevated T2 in the apical inferior wall was more suggestive of acute myocarditis. 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 He was started on Coumadin  for thrombus. He had transient LOC after MRI and was orthostatic, felt to be vagally-mediated. Also noted to have breast mass and new axillary lymphadenopathy, suspect breast cancer. Patient was diuresed and started on GDMT.  He was discharged with LifeVest with oncology follow up.  Readmitted 1 day after discharge with stroke-like symptoms of new left facial numbness, left facial drop and vision changes. INR 1.5 on admission, patient compliant with Lovenox  bridging since discharge. CT head negative, MRI showed patchy acute and subacute infarcts. Neurology consulted and recommended transitioning to DOAC given stroke in spite of being on warfarin/Lovenox . Deficits resolved and he was discharged home, weight 254 lbs.  He had left mastectomy for breast cancer in 8/23 and has completed taxotere /Cytoxan  chemotherapy.    Echo was done in 5/23, this showed EF 50-55%, mild MR, no LV thrombus.  Anticoagulation was stopped with improvement in LV function. Echo in 4/24 showed EF 50-55%, mild LVH.   In 1/25, he was found to have an enlarged supraclavicular node.  Left neck dissection showed 3/19 nodes positive for metastatic breast CA.  He is now on Tamoxifen  and Verzinio.   Echo 4/25 showed EF down to 30-35%, normal RV.  Today he returns for HF follow up. Overall feeling fine. No SOB walking up steps or with activity. Denies palpitations, abnormal bleeding, CP, dizziness, edema, or PND/Orthopnea. Appetite ok. No fever or chills. Not weighing at home. Taking all medications. Works as a Pharmacist, community.  ECG (personally reviewed): ST 114 bpm  Labs (2/23): K 4.9, creatinine 0.96, hgb 16.2 Labs (4/23): K 4.0, creatinine 0.80, hgb 13.5 Labs (8/23): K 4.2, creatinine 0.82 Labs (3/25): K 3.9, creatinine 1.3, LDL 28 Labs (4/25): K 4.4, creatinine 1.27  PMH: 1. 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  s/p left mastectomy in 8/23 with chemotherapy with taxotere /Cytoxan  and radiation.  - 2/25 left neck dissection showed 3/19 nodes positive for metastatic breast cancer.  2. 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   - Echo (2/23): dilated LV with EF 20-25%. - R/LHC (2/23): Elevated filling pressures but relatively preserved cardiac index of 2.3.  There was no significant coronary disease.  - cMRI (2/23): LV dilation with LVEF 10%, LV thrombus, mildly dilated RV with RVEF 15%.  Delayed enhancement imaging showed inferolateral LGE that could be a OM territory infarct, but corresponding lesion not seen by cath. Most likely acute myocarditis given subepicardial apical inferior LGE also (the RV insertion site LGE can be nonspecific in the setting of volume/pressure overload).  Elevated T2 in the apical inferior wall is more suggestive of acute myocarditis. - Echo (5/23): EF 50-55%, mild MR, no LV thrombus - Echo (4/24): EF 50-55%, mild LVH.  - Echo (4/25): EF  30-35%, normal RV 3. LV thrombus: Resolved on 5/23 echo.  4. CVA: 2/23, in setting of LV thrombus.     Current Outpatient Medications  Medication Sig Dispense Refill   abemaciclib  (VERZENIO ) 100 MG tablet Take 1 tablet (100 mg total) by mouth 2 (two) times daily. 60 tablet 6   atorvastatin  (LIPITOR) 20 MG tablet TAKE 1 TABLET BY MOUTH EVERY DAY 15 tablet 0   dapagliflozin  propanediol (FARXIGA ) 10 MG TABS tablet Take 1 tablet (10 mg total) by mouth daily before breakfast. 90 tablet 3   diphenhydramine -acetaminophen  (TYLENOL  PM) 25-500 MG TABS tablet Take 3 tablets by mouth at bedtime.     eplerenone  (INSPRA ) 50 MG tablet Take 1 tablet by mouth daily. 90 tablet 3   metoprolol  succinate (TOPROL -XL) 50 MG 24 hr tablet Take 1 tablet (50 mg total) by mouth daily. 90 tablet 3   ondansetron  (ZOFRAN -ODT) 4 MG disintegrating tablet Take 1 tablet (4 mg total) by mouth every 8 (eight) hours as needed for nausea or vomiting. 20 tablet 0   pregabalin  (LYRICA ) 100 MG capsule Take 1 capsule (100 mg total) by mouth 2 (two) times daily. 60 capsule 3   Pyridoxine HCl (VITAMIN B-6 PO) Take 1 tablet by mouth 2 (two) times daily.     sacubitril -valsartan  (ENTRESTO ) 49-51 MG Take 1 tablet by mouth 2 (two) times daily. 60 tablet 2   tamoxifen  (NOLVADEX ) 20 MG tablet TAKE 1 TABLET(20 MG) BY MOUTH DAILY 90 tablet 3   No current facility-administered medications for this encounter.   Allergies  Allergen Reactions   Bactrim [Sulfamethoxazole-Trimethoprim] Other (See Comments)    Blood capillaries rupture    Ovace Plus Wash [Sulfacetamide Sodium] Other (See Comments)    Blood capillaries rupture    Social History   Socioeconomic History   Marital status: Married    Spouse name: Not on file   Number of children: 2   Years of education: Not on file   Highest education level: Bachelor's degree (e.g., BA, AB, BS)  Occupational History   Occupation: crash Manufacturing systems engineer    Employer: [employer]  Tobacco Use   Smoking  status: Former    Current packs/day: 0.00    Average packs/day: 0.3 packs/day for 25.0 years (6.3 ttl pk-yrs)    Types: Cigarettes    Start date: 35    Quit date: 2022    Years since quitting: 3.3   Smokeless tobacco: Former   Tobacco comments:    Smoked 2PPD x 10 years. Last 13 years 3-4 cigarettes/day.   Vaping Use   Vaping status: Former   Substances: Nicotine  Substance and Sexual Activity   Alcohol  use: Yes    Alcohol /week: 21.0 standard drinks of alcohol     Types: 21 Cans of beer per week    Comment: 2 tall boys per day, everyday x 20years   Drug use: Not Currently    Frequency: 1.0 times per week    Types: Marijuana    Comment: 3x per month   Sexual activity: Not Currently  Other Topics Concern   Not on file  Social History Narrative   Right handed    Social Drivers of Health   Financial Resource Strain: Low Risk  (08/29/2021)   Overall Financial Resource Strain (CARDIA)    Difficulty of  Paying Living Expenses: Not very hard  Food Insecurity: No Food Insecurity (08/23/2023)   Hunger Vital Sign    Worried About Running Out of Food in the Last Year: Never true    Ran Out of Food in the Last Year: Never true  Transportation Needs: No Transportation Needs (08/23/2023)   PRAPARE - Administrator, Civil Service (Medical): No    Lack of Transportation (Non-Medical): No  Physical Activity: Not on file  Stress: Not on file  Social Connections: Unknown (11/09/2021)   Received from [facility]   Social Network    Social Network: Not on file  Intimate Partner Violence: Not At Risk (08/23/2023)   Humiliation, Afraid, Rape, and Kick questionnaire    Fear of Current or Ex-Partner: No    Emotionally Abused: No    Physically Abused: No    Sexually Abused: No   Family History  Problem Relation Age of Onset   Healthy Mother    BP 126/80   Pulse (!) 117   Wt (!) 141.8 kg (312 lb 9.6 oz)   SpO2 95%   BMI 39.07 kg/m   Wt Readings from Last 3  Encounters:  11/04/23 (!) 141.8 kg (312 lb 9.6 oz)  10/21/23 (!) 142.3 kg (313 lb 12.8 oz)  09/23/23 (!) 142.3 kg (313 lb 12.8 oz)   PHYSICAL EXAM: General:  NAD. No resp difficulty, walked into clinic HEENT: Normal Neck: Supple. No JVD. Cor: Tachy rate & rhythm. No rubs, gallops or murmurs. Lungs: Clear Abdomen: Soft, nontender, nondistended.  Extremities: No cyanosis, clubbing, rash, edema Neuro: Alert & oriented x 3, moves all 4 extremities w/o difficulty. Affect pleasant.  ASSESSMENT & PLAN: 1. Chronic Systolic CHF: Patient presented in 2/23 with CHF after a viral-type illness. HIV, TSH, COVID-19 test, and UDS were all negative. I do not think he was drinking enough ETOH to cause CMP. Patient has no family history of cardiomyopathy.  LHC/RHC showed no significant CAD, elevated filling pressures and relatively preserved cardiac output. Echo in 2/23 showed EF 20-25%. Cardiac MRI in 2/23 showed severe LV dilation with EF 10%, LV thrombus, mildly dilated RV with EF 15%.  Delayed enhancement imaging showed inferolateral LGE that could be a OM territory infarct, but corresponding lesion not seen by cath. Overall, most likely acute myocarditis given subepicardial apical inferior LGE also (the RV insertion site LGE can be nonspecific in the setting of volume/pressure overload). Elevated T2 in the apical inferior wall was more suggestive of acute myocarditis. 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 Echo in 5/23 and again in 4/24 showed EF up to 50-55%.  Echo 4/25 showed EF 30-35%, normal RV (he had been off Entresto  and Inspra ). NYHA class I, he is not volume overloaded by exam.  - Increase Toprol  XL to 75 mg daily. - Continue Entresto  at 49/51 bid. Labs reviewed from 10/21/23 and are stable. - Continue eplerenone  50 mg daily.   - Continue Farxiga  10 mg daily.  - Repeat echo in 3-4 months. 2. LV thrombus: With recovery of EF, anticoagulation  was stopped.  3. 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  Transitioned to Eliquis .  Anticoagulation stopped after 5/23 echo showed improved LV function.  4. Breast Cancer: Left breast CA, s/p mastectomy in 8/23.  He has completed course of taxotere  and cyotoxan as well as radiation. However, now metastatic to neck lymph  nodes.  He is on Verzinio.  5. ST: HR 114 on ECG today. No caffeine this AM. Recent labs stable. - Increase Toprol  as above - Place 2 week Zio to ensure adequate HR control in setting of reduced EF. - Consider addition of ivabradine  next.   Follow up in 3-4 months with Dr. Mitzie Anda + echo.  Arlice Bene Amory, FNP-BC 11/04/2023

## 2023-11-04 ENCOUNTER — Inpatient Hospital Stay (HOSPITAL_COMMUNITY)
Admission: RE | Admit: 2023-11-04 | Discharge: 2023-11-04 | Disposition: A | Discharge status: Home / Self Care | Source: Ambulatory Visit | Attending: Cardiology | Admitting: Cardiology

## 2023-11-04 ENCOUNTER — Other Ambulatory Visit (HOSPITAL_COMMUNITY): Payer: Self-pay | Admitting: Cardiology

## 2023-11-04 ENCOUNTER — Ambulatory Visit (HOSPITAL_COMMUNITY)
Admission: RE | Admit: 2023-11-04 | Discharge: 2023-11-04 | Disposition: A | Discharge status: Home / Self Care | Source: Ambulatory Visit | Attending: Family Medicine | Admitting: Family Medicine

## 2023-11-04 ENCOUNTER — Encounter (HOSPITAL_COMMUNITY): Payer: Self-pay

## 2023-11-04 VITALS — BP 126/80 | HR 117 | Wt 312.6 lb

## 2023-11-04 DIAGNOSIS — Z86718 Personal history of other venous thrombosis and embolism: Secondary | ICD-10-CM | POA: Diagnosis not present

## 2023-11-04 DIAGNOSIS — Z8673 Personal history of transient ischemic attack (TIA), and cerebral infarction without residual deficits: Secondary | ICD-10-CM | POA: Diagnosis not present

## 2023-11-04 DIAGNOSIS — R000 Tachycardia, unspecified: Secondary | ICD-10-CM

## 2023-11-04 DIAGNOSIS — I513 Intracardiac thrombosis, not elsewhere classified: Secondary | ICD-10-CM

## 2023-11-04 DIAGNOSIS — Z9012 Acquired absence of left breast and nipple: Secondary | ICD-10-CM | POA: Diagnosis not present

## 2023-11-04 DIAGNOSIS — Z853 Personal history of malignant neoplasm of breast: Secondary | ICD-10-CM | POA: Diagnosis not present

## 2023-11-04 DIAGNOSIS — Z7901 Long term (current) use of anticoagulants: Secondary | ICD-10-CM | POA: Diagnosis not present

## 2023-11-04 DIAGNOSIS — I5022 Chronic systolic (congestive) heart failure: Secondary | ICD-10-CM | POA: Diagnosis not present

## 2023-11-04 DIAGNOSIS — C50922 Malignant neoplasm of unspecified site of left male breast: Secondary | ICD-10-CM

## 2023-11-04 DIAGNOSIS — Z7984 Long term (current) use of oral hypoglycemic drugs: Secondary | ICD-10-CM | POA: Insufficient documentation

## 2023-11-04 DIAGNOSIS — C770 Secondary and unspecified malignant neoplasm of lymph nodes of head, face and neck: Secondary | ICD-10-CM | POA: Insufficient documentation

## 2023-11-04 DIAGNOSIS — Z79899 Other long term (current) drug therapy: Secondary | ICD-10-CM | POA: Diagnosis not present

## 2023-11-04 DIAGNOSIS — Z170 Estrogen receptor positive status [ER+]: Secondary | ICD-10-CM

## 2023-11-04 MED ORDER — METOPROLOL SUCCINATE ER 50 MG PO TB24: 75.0000 mg | ORAL_TABLET | Freq: Every day | ORAL | 3 refills | Status: AC

## 2023-11-04 NOTE — Patient Instructions (Addendum)
 Thank you for coming in today  If you had labs drawn today, any labs that are abnormal the clinic will call you No news is good news  Your provider has recommended that  you wear a Zio Patch for 14  days.  This monitor will record your heart rhythm for our review.  IF you have any symptoms while wearing the monitor please press the button.  If you have any issues with the patch or you notice a red or orange light on it please call the company at [phone].  Once you remove the patch please mail it back to the company as soon as possible so we can get the results.   Medications: Increase Toprol  75 mg daily 1 1/2 tablet daily   Follow up appointments:  Your physician recommends that you schedule a follow-up appointment in:  4 months with echocardiogram  Please call our office to schedule the follow-up appointment in June for August 2025  Your physician has requested that you have an echocardiogram. Echocardiography is a painless test that uses sound waves to create images of your heart. It provides your doctor with information about the size and shape of your heart and how well your heart's chambers and valves are working. This procedure takes approximately one hour. There are no restrictions for this procedure.      Do the following things EVERYDAY: Weigh yourself in the morning before breakfast. Write it down and keep it in a log. Take your medicines as prescribed Eat low salt foods--Limit salt (sodium) to 2000 mg per day.  Stay as active as you can everyday Limit all fluids for the day to less than 2 liters   At [facility], you and your health needs are our priority. As part of our continuing mission to provide you with exceptional heart care, we have created designated Provider Care Teams. These Care Teams include your primary Cardiologist (physician) and Advanced Practice Providers (APPs- Physician Assistants and Nurse Practitioners) who all work together  to provide you with the care you need, when you need it.   You may see any of the following providers on your designated Care Team at your next follow up: Dr Jules Oar Dr Peder Bourdon Dr. Mimi Alt, NP Ruddy Corral, [place] [facility] [place], [place] Dennise Fitz, NP Luster Salters, PharmD   Please be sure to bring in all your medications bottles to every appointment.    Thank you for choosing [facility]  If you have any questions or concerns before your next appointment please send us  a message through [place] or call our office at [phone].    TO LEAVE A MESSAGE FOR THE NURSE SELECT OPTION 2, PLEASE LEAVE A MESSAGE INCLUDING: YOUR NAME DATE OF BIRTH CALL BACK NUMBER REASON FOR CALL**this is important as we prioritize the call backs  YOU WILL RECEIVE A CALL BACK THE SAME DAY AS LONG AS YOU CALL BEFORE 4:00 PM

## 2023-11-18 ENCOUNTER — Encounter (HOSPITAL_COMMUNITY): Payer: Self-pay

## 2023-11-18 ENCOUNTER — Ambulatory Visit (HOSPITAL_COMMUNITY)
Admission: RE | Admit: 2023-11-18 | Discharge: 2023-11-18 | Disposition: A | Discharge status: Home / Self Care | Source: Ambulatory Visit | Attending: Hematology and Oncology | Admitting: Hematology and Oncology

## 2023-11-18 DIAGNOSIS — R160 Hepatomegaly, not elsewhere classified: Secondary | ICD-10-CM | POA: Diagnosis not present

## 2023-11-18 DIAGNOSIS — C50422 Malignant neoplasm of upper-outer quadrant of left male breast: Secondary | ICD-10-CM | POA: Insufficient documentation

## 2023-11-18 DIAGNOSIS — Z170 Estrogen receptor positive status [ER+]: Secondary | ICD-10-CM | POA: Diagnosis not present

## 2023-11-18 DIAGNOSIS — K5730 Diverticulosis of large intestine without perforation or abscess without bleeding: Secondary | ICD-10-CM | POA: Diagnosis not present

## 2023-11-18 DIAGNOSIS — C760 Malignant neoplasm of head, face and neck: Secondary | ICD-10-CM | POA: Diagnosis not present

## 2023-11-18 MED ORDER — IOHEXOL 300 MG/ML  SOLN
100.0000 mL | Freq: Once | INTRAMUSCULAR | Status: AC | PRN
  Administered 2023-11-18: 100 mL via INTRAVENOUS

## 2023-11-18 MED ORDER — SODIUM CHLORIDE (PF) 0.9 % IJ SOLN
INTRAMUSCULAR | Status: AC
  Filled 2023-11-18: qty 50

## 2023-11-21 DIAGNOSIS — Z170 Estrogen receptor positive status [ER+]: Secondary | ICD-10-CM | POA: Diagnosis not present

## 2023-11-21 DIAGNOSIS — C50422 Malignant neoplasm of upper-outer quadrant of left male breast: Secondary | ICD-10-CM | POA: Diagnosis not present

## 2023-11-21 DIAGNOSIS — C8591 Non-Hodgkin lymphoma, unspecified, lymph nodes of head, face, and neck: Secondary | ICD-10-CM | POA: Diagnosis not present

## 2023-11-26 NOTE — Assessment & Plan Note (Signed)
 09/15/2021:Palpable left breast mass x8 months. CT 08/27/2021 in ED showed 3.8 cm left breast mass with left axillary lymph nodes.Mamm and US : 4 cm mass with 5 axillary lymph nodes: Biopsy: Grade 2 IDC, lymph node: Positive, ER 100%, PR 50%, Ki-67 25%, HER2 negative ratio 1.25, copy #3.45 (Recent history of coronary artery disease EF 22% and stroke)   Treatment plan: 1. Neoadjuvant chemotherapy with Taxotere  and Cytoxan  every 3 weeks x6 cycles completed 01/16/2022 2. left mastectomy: Residual grade 2 IDC 3.5 cm, margins negative, 2/2 lymph nodes positive, angiolymphatic invasion present, ER 100%, PR 50%, HER2 negative, Ki-67 25% 3. Followed by adjuvant radiation therapy 04/03/22-05/16/22 4.  Followed by adjuvant antiestrogen therapy with tamoxifen  started November 2023 5. Signatera test positive (0.3)  06/26/2023: Bone scan: Negative, CT CAP: Enlarged left supraclavicular lymph node 2.1 cm, (used to be 0.8 cm) new tiny scattered nonspecific lung nodules (4 mm and 3 mm) -------------------------------------------------------------------------------------------------------------------- 07/16/2023: Biopsy left supraclavicular lymph node: Metastatic poorly differentiated carcinoma consistent with breast ER 75%, PR 75%, Ki67 40%, HER2 1+ 08/23/2023: Left neck dissection: 3/19 lymph nodes positive consistent with breast primary, extranodal extension on 3 lymph nodes, lymphovascular invasion identified   Current treatment: Tamoxifen  with Verzinio (dose decreased to 100 p.o. twice daily)   Verzinio toxicities: Severe fatigue: Doing significantly better on Verzinio dosage of 100 mg twice daily. He will be away and another trip in 3 weeks.     Assessment & Plan   - Order CT scan of chest and abdomen in three weeks to monitor for metastasis. - Schedule Signatera test. - Continue Verzenio  100 mg oral twice daily.    Peripheral neuropathy- Discussed supplemental options such as menthol rubs, CBD oil,  magnesium, B12, and PEA with luteolin.   We will Signatera testing. Return to clinic in 5 weeks for follow-up after scans.

## 2023-11-27 ENCOUNTER — Inpatient Hospital Stay: Attending: Hematology and Oncology

## 2023-11-27 ENCOUNTER — Inpatient Hospital Stay: Admitting: Hematology and Oncology

## 2023-11-27 VITALS — BP 148/88 | HR 84 | Temp 98.0°F | Resp 18 | Ht 75.0 in

## 2023-11-27 DIAGNOSIS — C770 Secondary and unspecified malignant neoplasm of lymph nodes of head, face and neck: Secondary | ICD-10-CM | POA: Diagnosis not present

## 2023-11-27 DIAGNOSIS — C50422 Malignant neoplasm of upper-outer quadrant of left male breast: Secondary | ICD-10-CM

## 2023-11-27 DIAGNOSIS — R918 Other nonspecific abnormal finding of lung field: Secondary | ICD-10-CM | POA: Diagnosis not present

## 2023-11-27 DIAGNOSIS — Z9221 Personal history of antineoplastic chemotherapy: Secondary | ICD-10-CM | POA: Insufficient documentation

## 2023-11-27 DIAGNOSIS — Z1732 Human epidermal growth factor receptor 2 negative status: Secondary | ICD-10-CM | POA: Diagnosis not present

## 2023-11-27 DIAGNOSIS — Z9012 Acquired absence of left breast and nipple: Secondary | ICD-10-CM | POA: Insufficient documentation

## 2023-11-27 DIAGNOSIS — Z923 Personal history of irradiation: Secondary | ICD-10-CM | POA: Insufficient documentation

## 2023-11-27 DIAGNOSIS — Z1721 Progesterone receptor positive status: Secondary | ICD-10-CM | POA: Diagnosis not present

## 2023-11-27 DIAGNOSIS — Z170 Estrogen receptor positive status [ER+]: Secondary | ICD-10-CM | POA: Insufficient documentation

## 2023-11-27 LAB — CBC WITH DIFFERENTIAL (CANCER CENTER ONLY)
Abs Immature Granulocytes: 0.02 10*3/uL (ref 0.00–0.07)
Basophils Absolute: 0.1 10*3/uL (ref 0.0–0.1)
Basophils Relative: 1 %
Eosinophils Absolute: 0.2 10*3/uL (ref 0.0–0.5)
Eosinophils Relative: 4 %
HCT: 45.3 % (ref 39.0–52.0)
Hemoglobin: 15.8 g/dL (ref 13.0–17.0)
Immature Granulocytes: 0 %
Lymphocytes Relative: 35 %
Lymphs Abs: 2.2 10*3/uL (ref 0.7–4.0)
MCH: 32.1 pg (ref 26.0–34.0)
MCHC: 34.9 g/dL (ref 30.0–36.0)
MCV: 92.1 fL (ref 80.0–100.0)
Monocytes Absolute: 0.6 10*3/uL (ref 0.1–1.0)
Monocytes Relative: 9 %
Neutro Abs: 3.2 10*3/uL (ref 1.7–7.7)
Neutrophils Relative %: 51 %
Platelet Count: 215 10*3/uL (ref 150–400)
RBC: 4.92 MIL/uL (ref 4.22–5.81)
RDW: 12.9 % (ref 11.5–15.5)
WBC Count: 6.4 10*3/uL (ref 4.0–10.5)
nRBC: 0 % (ref 0.0–0.2)

## 2023-11-27 LAB — CMP (CANCER CENTER ONLY)
ALT: 27 U/L (ref 0–44)
AST: 31 U/L (ref 15–41)
Albumin: 4.3 g/dL (ref 3.5–5.0)
Alkaline Phosphatase: 61 U/L (ref 38–126)
Anion gap: 6 (ref 5–15)
BUN: 9 mg/dL (ref 6–20)
CO2: 25 mmol/L (ref 22–32)
Calcium: 9.3 mg/dL (ref 8.9–10.3)
Chloride: 107 mmol/L (ref 98–111)
Creatinine: 1.03 mg/dL (ref 0.61–1.24)
GFR, Estimated: >60 mL/min (ref >60)
Glucose, Bld: 100 mg/dL — ABNORMAL HIGH (ref 70–99)
Potassium: 4.3 mmol/L (ref 3.5–5.1)
Sodium: 138 mmol/L (ref 135–145)
Total Bilirubin: 0.5 mg/dL (ref 0.0–1.2)
Total Protein: 7.2 g/dL (ref 6.5–8.1)

## 2023-11-27 NOTE — Progress Notes (Signed)
 Patient Care Team: Pcp, No as PCP - General Auther Bo, RN as Oncology Nurse Navigator Alane Hsu, RN as Oncology Nurse Navigator Emory Harps (Neurology) Cameron Cea, MD as Medical Oncologist (Hematology and Oncology)  DIAGNOSIS:  Encounter Diagnosis  Name Primary?   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 CT 08/27/2021 in ED showed 3.8 centimeter left breast mass with left axillary lymph nodes.Mamm and US : 4 cm mass with 5 axillary lymph nodes: Biopsy: Grade 2 IDC, lymph node: Positive, ER 100%, PR 50%, Ki-67 25%, HER2 negative ratio 1.25, copy #3.45   09/25/2021 Cancer Staging   Staging form: Breast, AJCC 8th Edition - Clinical: Stage IIA (cT2, cN1, cM0, G2, ER+, PR+, HER2-) - Signed by Cameron Cea, MD on 09/25/2021 Histologic grading system: 3 grade system   10/03/2021 - 01/18/2022 Chemotherapy   Patient is on Treatment Plan : BREAST TC q21d     01/22/2022 Genetic Testing   Negative genetic testing on the CancerNext-Expanded+RNAinsight.  The report date is January 22, 2022.  The CancerNext-Expanded gene panel offered by [facility] and includes sequencing and rearrangement analysis for the following 77 genes: AIP, ALK, APC*, ATM*, AXIN2, BAP1, BARD1, BLM, BMPR1A, BRCA1*, BRCA2*, BRIP1*, CDC73, CDH1*, CDK4, CDKN1B, CDKN2A, CHEK2*, CTNNA1, DICER1, FANCC, FH, FLCN, GALNT12, KIF1B, LZTR1, MAX, MEN1, MET, MLH1*, MSH2*, MSH3, MSH6*, MUTYH*, NBN, NF1*, NF2, NTHL1, PALB2*, PHOX2B, PMS2*, POT1, PRKAR1A, PTCH1, PTEN*, RAD51C*, RAD51D*, RB1, RECQL, RET, SDHA, SDHAF2, SDHB, SDHC, SDHD, SMAD4, SMARCA4, SMARCB1, SMARCE1, STK11, SUFU, TMEM127, TP53*, TSC1, TSC2, VHL and XRCC2 (sequencing and  deletion/duplication); EGFR, EGLN1, HOXB13, KIT, MITF, PDGFRA, POLD1, and POLE (sequencing only); EPCAM and GREM1 (deletion/duplication only). DNA and RNA analyses performed for * genes.      CHIEF COMPLIANT:   HISTORY OF PRESENT ILLNESS:  History of Present Illness Michael Stuart is a 43 year old male with lung nodules who presents for follow-up of his CT scan results.  One lung nodule has disappeared, while others remain but are very small. He is awaiting results from a recent blood draw. Kidney function remains stable with improved hydration.  He recently traveled to [place]  and experienced significant sneezing, likely due to unfamiliar pollen, which progressed to a sensation of 'crustiness' or 'fluffiness' in the lungs. He has a history of allergies, particularly when living in [place] .  He is taking Verzenio , which could cause lung inflammation. He also has a history of fatty liver.     ALLERGIES:  is allergic to bactrim [sulfamethoxazole-trimethoprim] and ovace plus wash [sulfacetamide sodium].  MEDICATIONS:  Current Outpatient Medications  Medication Sig Dispense Refill   abemaciclib  (VERZENIO ) 100 MG tablet Take 1 tablet (100 mg total) by mouth 2 (two) times daily. 60 tablet 6   atorvastatin  (LIPITOR) 20 MG tablet TAKE 1 TABLET BY MOUTH EVERY DAY 15 tablet 0   dapagliflozin  propanediol (FARXIGA ) 10 MG TABS tablet Take 1 tablet (10 mg total) by mouth daily before breakfast. 90 tablet 3   diphenhydramine -acetaminophen  (TYLENOL  PM) 25-500 MG TABS tablet Take 3 tablets by mouth at bedtime.     eplerenone  (INSPRA ) 50 MG tablet Take 1 tablet by mouth daily. 90 tablet 3   metoprolol  succinate (TOPROL -XL) 50 MG 24 hr tablet Take 1.5 tablets (  75 mg total) by mouth daily. 135 tablet 3   ondansetron  (ZOFRAN -ODT) 4 MG disintegrating tablet Take 1 tablet (4 mg total) by mouth every 8 (eight) hours as needed for nausea or vomiting. (Patient not taking: Reported on 11/27/2023) 20 tablet 0    pregabalin  (LYRICA ) 100 MG capsule Take 1 capsule (100 mg total) by mouth 2 (two) times daily. (Patient not taking: Reported on 11/27/2023) 60 capsule 3   Pyridoxine HCl (VITAMIN B-6 PO) Take 1 tablet by mouth 2 (two) times daily.     sacubitril -valsartan  (ENTRESTO ) 49-51 MG Take 1 tablet by mouth 2 (two) times daily. 60 tablet 2   tamoxifen  (NOLVADEX ) 20 MG tablet TAKE 1 TABLET(20 MG) BY MOUTH DAILY 90 tablet 3   No current facility-administered medications for this visit.    PHYSICAL EXAMINATION: ECOG PERFORMANCE STATUS: 1 - Symptomatic but completely ambulatory  Vitals:   11/27/23 1153  BP: (!) 148/88  Pulse: 84  Resp: 18  Temp: 98 F (36.7 C)  SpO2: 97%   There were no vitals filed for this visit.  Physical Exam   (exam performed in the presence of a chaperone)  LABORATORY DATA:  I have reviewed the data as listed    Latest Ref Rng & Units 11/27/2023   11:15 AM 10/21/2023    2:52 PM 10/07/2023    9:21 AM  CMP  Glucose 70 - 99 mg/dL 604  540  981   BUN 6 - 20 mg/dL 9  11  7    Creatinine 0.61 - 1.24 mg/dL 1.91  4.78  2.95   Sodium 135 - 145 mmol/L 138  139  141   Potassium 3.5 - 5.1 mmol/L 4.3  4.4  4.2   Chloride 98 - 111 mmol/L 107  104  107   CO2 22 - 32 mmol/L 25  26  24    Calcium  8.9 - 10.3 mg/dL 9.3  9.4  9.4   Total Protein 6.5 - 8.1 g/dL 7.2  7.6    Total Bilirubin 0.0 - 1.2 mg/dL 0.5  0.3    Alkaline Phos 38 - 126 U/L 61  69    AST 15 - 41 U/L 31  36    ALT 0 - 44 U/L 27  38      Lab Results  Component Value Date   WBC 6.4 11/27/2023   HGB 15.8 11/27/2023   HCT 45.3 11/27/2023   MCV 92.1 11/27/2023   PLT 215 11/27/2023   NEUTROABS 3.2 11/27/2023    ASSESSMENT & PLAN:  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 CT 08/27/2021 in ED showed 3.8 cm left breast mass with left axillary lymph nodes.Mamm and US : 4 cm mass with 5 axillary lymph nodes:  Biopsy: Grade 2 IDC, lymph node: Positive, ER 100%, PR 50%, Ki-67 25%, HER2 negative ratio 1.25, copy #3.45 (Recent history of coronary artery disease EF 22% and stroke)   Treatment plan: 1. Neoadjuvant chemotherapy with Taxotere  and Cytoxan  every 3 weeks x6 cycles completed 01/16/2022 2. left mastectomy: Residual grade 2 IDC 3.5 cm, margins negative, 2/2 lymph nodes positive, angiolymphatic invasion present, ER 100%, PR 50%, HER2 negative, Ki-67 25% 3. Followed by adjuvant radiation therapy 04/03/22-05/16/22 4.  Followed by adjuvant antiestrogen therapy with tamoxifen  started November 2023 5. Signatera test positive (0.3)  06/26/2023: Bone scan: Negative, CT CAP: Enlarged left supraclavicular lymph node 2.1 cm, (used to be 0.8 cm) new tiny scattered  nonspecific lung nodules (4 mm and 3 mm) -------------------------------------------------------------------------------------------------------------------- 07/16/2023: Biopsy left supraclavicular lymph node: Metastatic poorly differentiated carcinoma consistent with breast ER 75%, PR 75%, Ki67 40%, HER2 1+ 08/23/2023: Left neck dissection: 3/19 lymph nodes positive consistent with breast primary, extranodal extension on 3 lymph nodes, lymphovascular invasion identified   Current treatment: Tamoxifen  with Verzinio (dose decreased to 100 p.o. twice daily)   Verzinio toxicities: Severe fatigue: Doing significantly better on Verzinio dosage of 100 mg twice daily.    CT CAP 11/23/2023: No evidence of metastatic disease.  Previously noted lung nodules appear similar or resolved. Labs and follow-up in 3 months Repeat scans again in 6 months.  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 Lyrica  ineffective. Gabapentin  tried. Cymbalta not used due to SSRI concerns. Discussed PEA supplement; some report benefits despite mixed trial results. - Recommend PEA supplement for 30 days to assess effectiveness. - Encourage exercise 4-5 times  weekly to improve blood flow and aid recovery.      No orders of the defined types were placed in this encounter.  The patient has a good understanding of the overall plan. he agrees with it. he will call with any problems that may develop before the next visit here. Total time spent: 30 mins including face to face time and time spent for planning, charting and co-ordination of care   Viinay K Monseratt Ledin, MD 11/27/23

## 2023-11-28 ENCOUNTER — Telehealth: Payer: Self-pay | Admitting: Hematology and Oncology

## 2023-11-28 NOTE — Telephone Encounter (Signed)
 Confirmed with pt scheduled appt date and time

## 2023-12-01 LAB — SIGNATERA ONLY (NATERA MANAGED)
SIGNATERA MTM READOUT: 0 MTM/ml
SIGNATERA TEST RESULT: NEGATIVE

## 2024-02-24 ENCOUNTER — Other Ambulatory Visit: Payer: Self-pay

## 2024-02-26 ENCOUNTER — Other Ambulatory Visit: Payer: Self-pay | Admitting: *Deleted

## 2024-02-26 MED ORDER — ABEMACICLIB 100 MG PO TABS: 100.0000 mg | ORAL_TABLET | Freq: Two times a day (BID) | ORAL | 6 refills | Status: DC

## 2024-03-02 ENCOUNTER — Inpatient Hospital Stay: Admitting: Hematology and Oncology

## 2024-03-02 ENCOUNTER — Inpatient Hospital Stay: Attending: Hematology and Oncology

## 2024-03-02 VITALS — BP 142/92 | HR 110 | Temp 98.5°F | Resp 18 | Wt 309.9 lb

## 2024-03-02 DIAGNOSIS — C50422 Malignant neoplasm of upper-outer quadrant of left male breast: Secondary | ICD-10-CM | POA: Insufficient documentation

## 2024-03-02 DIAGNOSIS — T451X5A Adverse effect of antineoplastic and immunosuppressive drugs, initial encounter: Secondary | ICD-10-CM | POA: Diagnosis not present

## 2024-03-02 DIAGNOSIS — Z1721 Progesterone receptor positive status: Secondary | ICD-10-CM | POA: Insufficient documentation

## 2024-03-02 DIAGNOSIS — Z79810 Long term (current) use of selective estrogen receptor modulators (SERMs): Secondary | ICD-10-CM | POA: Insufficient documentation

## 2024-03-02 DIAGNOSIS — Z170 Estrogen receptor positive status [ER+]: Secondary | ICD-10-CM | POA: Insufficient documentation

## 2024-03-02 DIAGNOSIS — C770 Secondary and unspecified malignant neoplasm of lymph nodes of head, face and neck: Secondary | ICD-10-CM | POA: Insufficient documentation

## 2024-03-02 DIAGNOSIS — G620 Drug-induced polyneuropathy: Secondary | ICD-10-CM | POA: Diagnosis not present

## 2024-03-02 DIAGNOSIS — Z1732 Human epidermal growth factor receptor 2 negative status: Secondary | ICD-10-CM | POA: Insufficient documentation

## 2024-03-02 LAB — CMP (CANCER CENTER ONLY)
ALT: 22 U/L (ref 0–44)
AST: 29 U/L (ref 15–41)
Albumin: 4.2 g/dL (ref 3.5–5.0)
Alkaline Phosphatase: 68 U/L (ref 38–126)
Anion gap: 9 (ref 5–15)
BUN: 10 mg/dL (ref 6–20)
CO2: 23 mmol/L (ref 22–32)
Calcium: 9.1 mg/dL (ref 8.9–10.3)
Chloride: 107 mmol/L (ref 98–111)
Creatinine: 1.01 mg/dL (ref 0.61–1.24)
GFR, Estimated: >60 mL/min (ref >60)
Glucose, Bld: 108 mg/dL — ABNORMAL HIGH (ref 70–99)
Potassium: 4 mmol/L (ref 3.5–5.1)
Sodium: 139 mmol/L (ref 135–145)
Total Bilirubin: 0.5 mg/dL (ref 0.0–1.2)
Total Protein: 7 g/dL (ref 6.5–8.1)

## 2024-03-02 LAB — CBC WITH DIFFERENTIAL (CANCER CENTER ONLY)
Abs Immature Granulocytes: 0.02 K/uL (ref 0.00–0.07)
Basophils Absolute: 0.1 K/uL (ref 0.0–0.1)
Basophils Relative: 2 %
Eosinophils Absolute: 0.2 K/uL (ref 0.0–0.5)
Eosinophils Relative: 3 %
HCT: 43.3 % (ref 39.0–52.0)
Hemoglobin: 15.2 g/dL (ref 13.0–17.0)
Immature Granulocytes: 0 %
Lymphocytes Relative: 43 %
Lymphs Abs: 2.7 K/uL (ref 0.7–4.0)
MCH: 32.6 pg (ref 26.0–34.0)
MCHC: 35.1 g/dL (ref 30.0–36.0)
MCV: 92.9 fL (ref 80.0–100.0)
Monocytes Absolute: 0.5 K/uL (ref 0.1–1.0)
Monocytes Relative: 8 %
Neutro Abs: 2.8 K/uL (ref 1.7–7.7)
Neutrophils Relative %: 44 %
Platelet Count: 240 K/uL (ref 150–400)
RBC: 4.66 MIL/uL (ref 4.22–5.81)
RDW: 13.4 % (ref 11.5–15.5)
WBC Count: 6.3 K/uL (ref 4.0–10.5)
nRBC: 0 % (ref 0.0–0.2)

## 2024-03-02 NOTE — Assessment & Plan Note (Signed)
 09/15/2021:Palpable left breast mass x8 months. CT 08/27/2021 in ED showed 3.8 cm left breast mass with left axillary lymph nodes.Mamm and US : 4 cm mass with 5 axillary lymph nodes: Biopsy: Grade 2 IDC, lymph node: Positive, ER 100%, PR 50%, Ki-67 25%, HER2 negative ratio 1.25, copy #3.45 (Recent history of coronary artery disease EF 22% and stroke)   Treatment plan: 1. Neoadjuvant chemotherapy with Taxotere  and Cytoxan  every 3 weeks x6 cycles completed 01/16/2022 2. left mastectomy: Residual grade 2 IDC 3.5 cm, margins negative, 2/2 lymph nodes positive, angiolymphatic invasion present, ER 100%, PR 50%, HER2 negative, Ki-67 25% 3. Followed by adjuvant radiation therapy 04/03/22-05/16/22 4.  Followed by adjuvant antiestrogen therapy with tamoxifen  started November 2023 5. Signatera test positive (0.3)  06/26/2023: Bone scan: Negative, CT CAP: Enlarged left supraclavicular lymph node 2.1 cm, (used to be 0.8 cm) new tiny scattered nonspecific lung nodules (4 mm and 3 mm) -------------------------------------------------------------------------------------------------------------------- 07/16/2023: Biopsy left supraclavicular lymph node: Metastatic poorly differentiated carcinoma consistent with breast ER 75%, PR 75%, Ki67 40%, HER2 1+ 08/23/2023: Left neck dissection: 3/19 lymph nodes positive consistent with breast primary, extranodal extension on 3 lymph nodes, lymphovascular invasion identified   Current treatment: Tamoxifen  with Verzinio (100 p.o. twice daily)   Verzinio toxicities: Severe fatigue: Doing significantly better on Verzinio dosage of 100 mg twice daily.     CT CAP 11/23/2023: No evidence of metastatic disease.  Previously noted lung nodules appear similar or resolved. Labs and scans and follow-up in 3 months

## 2024-03-02 NOTE — Progress Notes (Signed)
 Patient Care Team: Pcp, No as PCP - General Glean Stephane BROCKS, RN (Inactive) as Oncology Nurse Navigator Tyree Nanetta SAILOR, RN as Oncology Nurse Navigator Dina Camie FORBES DEVONNA (Neurology) Odean Potts, MD as Medical Oncologist (Hematology and Oncology)  DIAGNOSIS:  Encounter Diagnosis  Name Primary?   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 CT 08/27/2021 in ED showed 3.8 centimeter left breast mass with left axillary lymph nodes.Mamm and US : 4 cm mass with 5 axillary lymph nodes: Biopsy: Grade 2 IDC, lymph node: Positive, ER 100%, PR 50%, Ki-67 25%, HER2 negative ratio 1.25, copy #3.45   09/25/2021 Cancer Staging   Staging form: Breast, AJCC 8th Edition - Clinical: Stage IIA (cT2, cN1, cM0, G2, ER+, PR+, HER2-) - Signed by Odean Potts, MD on 09/25/2021 Histologic grading system: 3 grade system   10/03/2021 - 01/18/2022 Chemotherapy   Patient is on Treatment Plan : BREAST TC q21d     01/22/2022 Genetic Testing   Negative genetic testing on the CancerNext-Expanded+RNAinsight.  The report date is January 22, 2022.  The CancerNext-Expanded gene panel offered by [facility] and includes sequencing and rearrangement analysis for the following 77 genes: AIP, ALK, APC*, ATM*, AXIN2, BAP1, BARD1, BLM, BMPR1A, BRCA1*, BRCA2*, BRIP1*, CDC73, CDH1*, CDK4, CDKN1B, CDKN2A, CHEK2*, CTNNA1, DICER1, FANCC, FH, FLCN, GALNT12, KIF1B, LZTR1, MAX, MEN1, MET, MLH1*, MSH2*, MSH3, MSH6*, MUTYH*, NBN, NF1*, NF2, NTHL1, PALB2*, PHOX2B, PMS2*, POT1, PRKAR1A, PTCH1, PTEN*, RAD51C*, RAD51D*, RB1, RECQL, RET, SDHA, SDHAF2, SDHB, SDHC, SDHD, SMAD4, SMARCA4, SMARCB1, SMARCE1, STK11, SUFU, TMEM127, TP53*, TSC1, TSC2, VHL and XRCC2  (sequencing and deletion/duplication); EGFR, EGLN1, HOXB13, KIT, MITF, PDGFRA, POLD1, and POLE (sequencing only); EPCAM and GREM1 (deletion/duplication only). DNA and RNA analyses performed for * genes.      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  He experiences persistent peripheral neuropathy in his bilateral feet, affecting his toes and the bottom half of his feet, with symptoms of numbness and burning sensation. The neuropathy is constant and unresponsive to Gabapentin  and Cymbalta.  He experiences significant fatigue but manages to maintain daily activities, including work, vacations, and family time. Resting after work helps manage the fatigue.  A scan three months ago showed negative markers. He is scheduled for scans every six months. He is currently taking Verzenio  twice a day without bowel issues.     ALLERGIES:  is allergic to bactrim [sulfamethoxazole-trimethoprim] and ovace plus wash [sulfacetamide sodium].  MEDICATIONS:  Current Outpatient Medications  Medication Sig Dispense Refill   abemaciclib  (VERZENIO ) 100 MG tablet Take 1 tablet (100 mg total) by mouth 2 (two) times daily. 60 tablet 6   atorvastatin  (LIPITOR) 20 MG tablet TAKE 1 TABLET BY MOUTH EVERY DAY 15 tablet 0   dapagliflozin  propanediol (FARXIGA ) 10 MG TABS tablet Take 1 tablet (10 mg total) by mouth daily before breakfast. 90 tablet 3   diphenhydramine -acetaminophen  (TYLENOL  PM) 25-500 MG TABS tablet Take 3 tablets by mouth at bedtime.     eplerenone  (INSPRA ) 50 MG tablet Take 1 tablet by mouth daily. 90 tablet 3   metoprolol  succinate (TOPROL -XL) 50  MG 24 hr tablet Take 1.5 tablets (75 mg total) by mouth daily. 135 tablet 3   ondansetron  (ZOFRAN -ODT) 4 MG disintegrating tablet Take 1 tablet (4 mg total) by mouth every 8  (eight) hours as needed for nausea or vomiting. (Patient not taking: Reported on 11/27/2023) 20 tablet 0   pregabalin  (LYRICA ) 100 MG capsule Take 1 capsule (100 mg total) by mouth 2 (two) times daily. (Patient not taking: Reported on 11/27/2023) 60 capsule 3   Pyridoxine HCl (VITAMIN B-6 PO) Take 1 tablet by mouth 2 (two) times daily.     sacubitril -valsartan  (ENTRESTO ) 49-51 MG Take 1 tablet by mouth 2 (two) times daily. 60 tablet 2   tamoxifen  (NOLVADEX ) 20 MG tablet TAKE 1 TABLET(20 MG) BY MOUTH DAILY 90 tablet 3   No current facility-administered medications for this visit.    PHYSICAL EXAMINATION: ECOG PERFORMANCE STATUS: 1 - Symptomatic but completely ambulatory  Vitals:   03/02/24 1136  BP: (!) 142/92  Pulse: (!) 110  Resp: 18  Temp: 98.5 F (36.9 C)  SpO2: 96%   Filed Weights   03/02/24 1136  Weight: (!) 309 lb 14.4 oz (140.6 kg)    Physical Exam   (exam performed in the presence of a chaperone)  LABORATORY DATA:  I have reviewed the data as listed    Latest Ref Rng & Units 03/02/2024   11:21 AM 11/27/2023   11:15 AM 10/21/2023    2:52 PM  CMP  Glucose 70 - 99 mg/dL 891  899  892   BUN 6 - 20 mg/dL 10  9  11    Creatinine 0.61 - 1.24 mg/dL 8.98  8.96  8.72   Sodium 135 - 145 mmol/L 139  138  139   Potassium 3.5 - 5.1 mmol/L 4.0  4.3  4.4   Chloride 98 - 111 mmol/L 107  107  104   CO2 22 - 32 mmol/L 23  25  26    Calcium  8.9 - 10.3 mg/dL 9.1  9.3  9.4   Total Protein 6.5 - 8.1 g/dL 7.0  7.2  7.6   Total Bilirubin 0.0 - 1.2 mg/dL 0.5  0.5  0.3   Alkaline Phos 38 - 126 U/L 68  61  69   AST 15 - 41 U/L 29  31  36   ALT 0 - 44 U/L 22  27  38     Lab Results  Component Value Date   WBC 6.3 03/02/2024   HGB 15.2 03/02/2024   HCT 43.3 03/02/2024   MCV 92.9 03/02/2024   PLT 240 03/02/2024   NEUTROABS 2.8 03/02/2024    ASSESSMENT & PLAN:  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 CT 08/27/2021 in ED showed 3.8 cm left breast mass with left axillary lymph nodes.Mamm and US : 4 cm mass with 5 axillary lymph nodes: Biopsy: Grade 2 IDC, lymph node: Positive, ER 100%, PR 50%, Ki-67 25%, HER2 negative ratio 1.25, copy #3.45 (Recent history of coronary artery disease EF 22% and stroke)   Treatment plan: 1. Neoadjuvant chemotherapy with Taxotere  and Cytoxan  every 3 weeks x6 cycles completed 01/16/2022 2. left mastectomy: Residual grade 2 IDC 3.5 cm, margins negative, 2/2 lymph nodes positive, angiolymphatic invasion present, ER 100%, PR 50%, HER2 negative, Ki-67 25% 3. Followed by adjuvant radiation therapy 04/03/22-05/16/22 4.  Followed by adjuvant antiestrogen therapy with tamoxifen  started November 2023 5. Signatera test positive (0.3)  06/26/2023: Bone scan: Negative, CT CAP: Enlarged left supraclavicular lymph node 2.1 cm, (used to be 0.8 cm) new tiny scattered nonspecific lung nodules (4 mm and 3 mm) -------------------------------------------------------------------------------------------------------------------- 07/16/2023: Biopsy left supraclavicular lymph node: Metastatic poorly differentiated carcinoma consistent with breast ER 75%, PR 75%, Ki67 40%, HER2 1+ 08/23/2023: Left neck dissection: 3/19 lymph nodes positive consistent with breast primary, extranodal extension on 3 lymph nodes, lymphovascular invasion identified   Current treatment: Tamoxifen  with Verzinio (100 p.o. twice daily)   Verzinio toxicities: Severe fatigue: Doing significantly better on Verzinio dosage of 100 mg twice daily.  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  It is not improving.  Gabapentin  does not work neither does Cymbalta.  We discussed about PEA.     CT CAP 11/23/2023: No evidence of metastatic disease.  Previously noted lung nodules appear similar or resolved. Labs and scans and follow-up in 3  months  ------------------------------------- Assessment and Plan Assessment & Plan ER-positive malignant neoplasm of upper-outer quadrant of left male breast ER-positive malignant neoplasm of the upper-outer quadrant of the left breast in a male. Managed with Abemaciclib  100 mg oral bid. Fatigue present. Previous scan and markers negative. - Continue Abemaciclib  100 mg oral bid. - Order whole body scan in three months. - Monitor tumor markers every six months.  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 Gabapentin  and Cymbalta ineffective. Compression socks not beneficial. Lyrica  not preferred due to addiction and sedative risks. Discussed PEA as a potential supplement despite negative trials, with some patient-reported benefits. - Consider trial of PEA with luteolin for one month.      No orders of the defined types were placed in this encounter.  The patient has a good understanding of the overall plan. he agrees with it. he will call with any problems that may develop before the next visit here. Total time spent: 30 mins including face to face time and time spent for planning, charting and co-ordination of care   Naomi MARLA Chad, MD 03/02/24

## 2024-05-11 ENCOUNTER — Other Ambulatory Visit: Payer: Self-pay | Admitting: Hematology and Oncology

## 2024-05-11 ENCOUNTER — Telehealth: Payer: Self-pay

## 2024-05-11 MED ORDER — DOXYCYCLINE HYCLATE 100 MG PO TABS: 100.0000 mg | ORAL_TABLET | Freq: Two times a day (BID) | ORAL | 0 refills | Status: DC

## 2024-05-11 NOTE — Progress Notes (Signed)
 Boil in the groin area: Sent a prescription for doxycycline

## 2024-05-11 NOTE — Telephone Encounter (Signed)
 T/C from pt requesting a prescription for doxycycline for treatment for a boil. He said he has had these all his life and does not want to go to Urgent Care and he does not have a PCP.  Pt advised to go to Urgent Care that we cannot send in a prescription for an antibiotic without being seen and this is not cancer related or something we would treat.

## 2024-06-02 ENCOUNTER — Inpatient Hospital Stay: Attending: Hematology and Oncology

## 2024-06-02 ENCOUNTER — Ambulatory Visit (HOSPITAL_COMMUNITY)
Admission: RE | Admit: 2024-06-02 | Discharge: 2024-06-02 | Disposition: A | Discharge status: Home / Self Care | Source: Ambulatory Visit | Attending: Hematology and Oncology | Admitting: Hematology and Oncology

## 2024-06-02 ENCOUNTER — Encounter (HOSPITAL_COMMUNITY): Payer: Self-pay

## 2024-06-02 DIAGNOSIS — C770 Secondary and unspecified malignant neoplasm of lymph nodes of head, face and neck: Secondary | ICD-10-CM | POA: Diagnosis not present

## 2024-06-02 DIAGNOSIS — Z170 Estrogen receptor positive status [ER+]: Secondary | ICD-10-CM | POA: Diagnosis not present

## 2024-06-02 DIAGNOSIS — C50422 Malignant neoplasm of upper-outer quadrant of left male breast: Secondary | ICD-10-CM | POA: Diagnosis not present

## 2024-06-02 DIAGNOSIS — Z1732 Human epidermal growth factor receptor 2 negative status: Secondary | ICD-10-CM | POA: Insufficient documentation

## 2024-06-02 DIAGNOSIS — Z79810 Long term (current) use of selective estrogen receptor modulators (SERMs): Secondary | ICD-10-CM | POA: Diagnosis not present

## 2024-06-02 DIAGNOSIS — R160 Hepatomegaly, not elsewhere classified: Secondary | ICD-10-CM | POA: Diagnosis not present

## 2024-06-02 DIAGNOSIS — Z1721 Progesterone receptor positive status: Secondary | ICD-10-CM | POA: Insufficient documentation

## 2024-06-02 DIAGNOSIS — K760 Fatty (change of) liver, not elsewhere classified: Secondary | ICD-10-CM | POA: Diagnosis not present

## 2024-06-02 DIAGNOSIS — R918 Other nonspecific abnormal finding of lung field: Secondary | ICD-10-CM | POA: Diagnosis not present

## 2024-06-02 DIAGNOSIS — C50919 Malignant neoplasm of unspecified site of unspecified female breast: Secondary | ICD-10-CM | POA: Diagnosis not present

## 2024-06-02 LAB — CBC WITH DIFFERENTIAL (CANCER CENTER ONLY)
Abs Immature Granulocytes: 0.02 K/uL (ref 0.00–0.07)
Basophils Absolute: 0.1 K/uL (ref 0.0–0.1)
Basophils Relative: 1 %
Eosinophils Absolute: 0.2 K/uL (ref 0.0–0.5)
Eosinophils Relative: 4 %
HCT: 44.2 % (ref 39.0–52.0)
Hemoglobin: 15.3 g/dL (ref 13.0–17.0)
Immature Granulocytes: 0 %
Lymphocytes Relative: 36 %
Lymphs Abs: 2.1 K/uL (ref 0.7–4.0)
MCH: 33.2 pg (ref 26.0–34.0)
MCHC: 34.6 g/dL (ref 30.0–36.0)
MCV: 95.9 fL (ref 80.0–100.0)
Monocytes Absolute: 0.4 K/uL (ref 0.1–1.0)
Monocytes Relative: 7 %
Neutro Abs: 3 K/uL (ref 1.7–7.7)
Neutrophils Relative %: 52 %
Platelet Count: 221 K/uL (ref 150–400)
RBC: 4.61 MIL/uL (ref 4.22–5.81)
RDW: 12.3 % (ref 11.5–15.5)
WBC Count: 5.8 K/uL (ref 4.0–10.5)
nRBC: 0 % (ref 0.0–0.2)

## 2024-06-02 LAB — CMP (CANCER CENTER ONLY)
ALT: 44 U/L (ref 0–44)
AST: 38 U/L (ref 15–41)
Albumin: 4.1 g/dL (ref 3.5–5.0)
Alkaline Phosphatase: 73 U/L (ref 38–126)
Anion gap: 8 (ref 5–15)
BUN: 11 mg/dL (ref 6–20)
CO2: 26 mmol/L (ref 22–32)
Calcium: 9.5 mg/dL (ref 8.9–10.3)
Chloride: 104 mmol/L (ref 98–111)
Creatinine: 1.19 mg/dL (ref 0.61–1.24)
GFR, Estimated: >60 mL/min (ref >60)
Glucose, Bld: 152 mg/dL — ABNORMAL HIGH (ref 70–99)
Potassium: 4.2 mmol/L (ref 3.5–5.1)
Sodium: 139 mmol/L (ref 135–145)
Total Bilirubin: 0.4 mg/dL (ref 0.0–1.2)
Total Protein: 7.1 g/dL (ref 6.5–8.1)

## 2024-06-02 MED ORDER — SODIUM CHLORIDE (PF) 0.9 % IJ SOLN
INTRAMUSCULAR | Status: AC
  Filled 2024-06-02: qty 50

## 2024-06-02 MED ORDER — IOHEXOL 300 MG/ML  SOLN
100.0000 mL | Freq: Once | INTRAMUSCULAR | Status: AC | PRN
  Administered 2024-06-02: 100 mL via INTRAVENOUS

## 2024-06-08 NOTE — Assessment & Plan Note (Signed)
 09/15/2021:Palpable left breast mass x8 months. CT 08/27/2021 in ED showed 3.8 cm left breast mass with left axillary lymph nodes.Mamm and US : 4 cm mass with 5 axillary lymph nodes: Biopsy: Grade 2 IDC, lymph node: Positive, ER 100%, PR 50%, Ki-67 25%, HER2 negative ratio 1.25, copy #3.45 (Recent history of coronary artery disease EF 22% and stroke)   Treatment plan: 1. Neoadjuvant chemotherapy with Taxotere  and Cytoxan  every 3 weeks x6 cycles completed 01/16/2022 2. left mastectomy: Residual grade 2 IDC 3.5 cm, margins negative, 2/2 lymph nodes positive, angiolymphatic invasion present, ER 100%, PR 50%, HER2 negative, Ki-67 25% 3. Followed by adjuvant radiation therapy 04/03/22-05/16/22 4.  Followed by adjuvant antiestrogen therapy with tamoxifen  started November 2023 5. Signatera test positive (0.3)  06/26/2023: Bone scan: Negative, CT CAP: Enlarged left supraclavicular lymph node 2.1 cm, (used to be 0.8 cm) new tiny scattered nonspecific lung nodules (4 mm and 3 mm) -------------------------------------------------------------------------------------------------------------------- 07/16/2023: Biopsy left supraclavicular lymph node: Metastatic poorly differentiated carcinoma consistent with breast ER 75%, PR 75%, Ki67 40%, HER2 1+ 08/23/2023: Left neck dissection: 3/19 lymph nodes positive consistent with breast primary, extranodal extension on 3 lymph nodes, lymphovascular invasion identified   Current treatment: Tamoxifen  with Verzinio (100 p.o. twice daily)   Verzinio toxicities: Severe fatigue: Doing significantly better on Verzinio dosage of 100 mg twice daily.   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  It is not improving.  Gabapentin  does not work neither does Cymbalta.  We discussed about PEA.     CT CAP 11/23/2023: No evidence of metastatic disease.  Previously noted lung nodules appear similar or resolved. CT CAP: 06/02/24:   Labs and follow-up in 3  months

## 2024-06-09 ENCOUNTER — Inpatient Hospital Stay: Attending: Hematology and Oncology | Admitting: Hematology and Oncology

## 2024-06-09 DIAGNOSIS — C50422 Malignant neoplasm of upper-outer quadrant of left male breast: Secondary | ICD-10-CM

## 2024-06-09 DIAGNOSIS — Z170 Estrogen receptor positive status [ER+]: Secondary | ICD-10-CM | POA: Diagnosis not present

## 2024-06-09 MED ORDER — TRAMADOL HCL 50 MG PO TABS: 50.0000 mg | ORAL_TABLET | Freq: Two times a day (BID) | ORAL | 0 refills | Status: DC | PRN

## 2024-06-09 NOTE — Progress Notes (Signed)
 HEMATOLOGY-ONCOLOGY TELEPHONE VISIT PROGRESS NOTE  I connected with our patient on 06/09/24 at 11:30 AM EST by telephone and verified that I am speaking with the correct person using two identifiers.  I discussed the limitations, risks, security and privacy concerns of performing an evaluation and management service by telephone and the availability of in person appointments.  I also discussed with the patient that there may be a patient responsible charge related to this service. The patient expressed understanding and agreed to proceed.   History of Present Illness: Telephone follow-up to review the results of CT scans  History of Present Illness Michael Stuart is a 43 year old male who presents for follow-up of pulmonary nodules.  He has pulmonary nodules that have appeared and resolved over time. The most recent CT shows multiple nodules 2-5 mm, including three new nodules and enlargement of a prior 2 mm nodule to 5 mm. These have been followed over the past year. Biopsy is not feasible due to his small size.  He has severe nocturnal foot pain, described as his toes feeling like they are in a vice. Tylenol , gabapentin , and Lyrica  have not helped.  He is planning work travel to [place] from early February to mid-March and again from late April to late May.    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 CT 08/27/2021 in ED showed 3.8 centimeter left breast mass with left axillary lymph nodes.Mamm and US : 4 cm mass with 5 axillary lymph nodes: Biopsy: Grade 2 IDC, lymph node: Positive, ER 100%, PR 50%, Ki-67 25%, HER2 negative ratio 1.25, copy #3.45   09/25/2021 Cancer Staging   Staging form: Breast, AJCC 8th Edition - Clinical: Stage IIA (cT2, cN1, cM0, G2, ER+, PR+, HER2-) - Signed by Odean Potts, MD on 09/25/2021 Histologic grading system: 3 grade  system   10/03/2021 - 01/18/2022 Chemotherapy   Patient is on Treatment Plan : BREAST TC q21d     01/22/2022 Genetic Testing   Negative genetic testing on the CancerNext-Expanded+RNAinsight.  The report date is January 22, 2022.  The CancerNext-Expanded gene panel offered by [facility] and includes sequencing and rearrangement analysis for the following 77 genes: AIP, ALK, APC*, ATM*, AXIN2, BAP1, BARD1, BLM, BMPR1A, BRCA1*, BRCA2*, BRIP1*, CDC73, CDH1*, CDK4, CDKN1B, CDKN2A, CHEK2*, CTNNA1, DICER1, FANCC, FH, FLCN, GALNT12, KIF1B, LZTR1, MAX, MEN1, MET, MLH1*, MSH2*, MSH3, MSH6*, MUTYH*, NBN, NF1*, NF2, NTHL1, PALB2*, PHOX2B, PMS2*, POT1, PRKAR1A, PTCH1, PTEN*, RAD51C*, RAD51D*, RB1, RECQL, RET, SDHA, SDHAF2, SDHB, SDHC, SDHD, SMAD4, SMARCA4, SMARCB1, SMARCE1, STK11, SUFU, TMEM127, TP53*, TSC1, TSC2, VHL and XRCC2 (sequencing and deletion/duplication); EGFR, EGLN1, HOXB13, KIT, MITF, PDGFRA, POLD1, and POLE (sequencing only); EPCAM and GREM1 (deletion/duplication only). DNA and RNA analyses performed for * genes.      REVIEW OF SYSTEMS:   Constitutional: Denies fevers, chills or abnormal weight loss All other systems were reviewed with the patient and are negative. 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 CT 08/27/2021 in ED showed 3.8 cm left breast mass with left axillary lymph nodes.Mamm and US : 4 cm mass with 5 axillary lymph nodes: Biopsy: Grade 2 IDC, lymph node: Positive, ER 100%, PR 50%, Ki-67 25%, HER2 negative ratio 1.25, copy #3.45 (Recent history of coronary artery disease EF 22% and  stroke)   Treatment plan: 1. Neoadjuvant chemotherapy with Taxotere  and Cytoxan  every 3 weeks x6 cycles completed 01/16/2022 2. left mastectomy: Residual grade 2 IDC 3.5 cm, margins negative, 2/2 lymph nodes positive, angiolymphatic invasion present, ER  100%, PR 50%, HER2 negative, Ki-67 25% 3. Followed by adjuvant radiation therapy 04/03/22-05/16/22 4.  Followed by adjuvant antiestrogen therapy with tamoxifen  started November 2023 5. Signatera test positive (0.3)  06/26/2023: Bone scan: Negative, CT CAP: Enlarged left supraclavicular lymph node 2.1 cm, (used to be 0.8 cm) new tiny scattered nonspecific lung nodules (4 mm and 3 mm) -------------------------------------------------------------------------------------------------------------------- 07/16/2023: Biopsy left supraclavicular lymph node: Metastatic poorly differentiated carcinoma consistent with breast ER 75%, PR 75%, Ki67 40%, HER2 1+ 08/23/2023: Left neck dissection: 3/19 lymph nodes positive consistent with breast primary, extranodal extension on 3 lymph nodes, lymphovascular invasion identified   Current treatment: Tamoxifen  with Verzinio (100 p.o. twice daily)   Verzinio toxicities: Severe fatigue: Doing significantly better on Verzinio dosage of 100 mg twice daily.   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  It is not improving.  I sent a prescription for Ultram.  Previously tried gabapentin  and Lyrica  and both have not worked.    CT CAP 11/23/2023: No evidence of metastatic disease.  Previously noted lung nodules appear similar or resolved. CT CAP: 06/02/24: New and enlarging pulmonary nodules, 5 mm right middle lobe nodule (was 1 to 2 mm previously) anterior to this 4 mm nodule (new), 2 to 3 mm left lower lobe lung nodule (new) right lower lobe 5 mm nodule is also new.  No other distant metastatic disease.  He is going to [place] for work multiple times throughout next year. Recommendation: Repeat a CT chest in 3 months.   Assessment & Plan Malignant neoplasm of upper-outer quadrant of left male breast, estrogen receptor positive Intermittent pulmonary nodules, largest 5 mm, with previous resolution of nodules. Current nodules include new and  enlarged ones. Biopsy not feasible due to size. Monitoring strategy employed. - Ordered chest CT in three months to monitor pulmonary nodules. - Instructed to contact Signatera for blood draw to monitor tumor markers.  Chronic polyneuropathy with pain Chronic foot pain, particularly nocturnal, unresponsive to gabapentin , Lyrica , and Tylenol . - Prescribed Ultram for nighttime pain relief.      I discussed the assessment and treatment plan with the patient. The patient was provided an opportunity to ask questions and all were answered. The patient agreed with the plan and demonstrated an understanding of the instructions. The patient was advised to call back or seek an in-person evaluation if the symptoms worsen or if the condition fails to improve as anticipated.   I provided 20 minutes of non-face-to-face time during this encounter.  This includes time for charting and coordination of care   Naomi MARLA Chad, MD

## 2024-06-15 ENCOUNTER — Encounter: Payer: Self-pay | Admitting: Hematology and Oncology

## 2024-06-16 ENCOUNTER — Other Ambulatory Visit: Payer: Self-pay | Admitting: *Deleted

## 2024-06-16 DIAGNOSIS — Z170 Estrogen receptor positive status [ER+]: Secondary | ICD-10-CM

## 2024-06-16 NOTE — Progress Notes (Signed)
 Received mychart message from pt with concern regarding a new raised, red lump over left clavicle area. RN reviewed with MD who has recommended pt see surgery for biopsy.  In-basket sent to Dr. Vanderbilt and his RN for an appt. Pt notified and verbalized understanding.

## 2024-06-22 ENCOUNTER — Other Ambulatory Visit: Payer: Self-pay | Admitting: Surgery

## 2024-06-22 DIAGNOSIS — C50922 Malignant neoplasm of unspecified site of left male breast: Secondary | ICD-10-CM | POA: Diagnosis not present

## 2024-06-23 ENCOUNTER — Other Ambulatory Visit: Payer: Self-pay | Admitting: *Deleted

## 2024-06-23 MED ORDER — AZITHROMYCIN 250 MG PO TABS: ORAL_TABLET | ORAL | 0 refills | Status: DC

## 2024-06-23 MED ORDER — BENZONATATE 200 MG PO CAPS: 200.0000 mg | ORAL_CAPSULE | Freq: Three times a day (TID) | ORAL | 0 refills | Status: DC | PRN

## 2024-06-23 NOTE — Progress Notes (Signed)
 Received call from pt stating his son was recently sick with URI (covid and flu negative).  Pt states he is currently experiencing thick productive cough with no fever at this time.  RN reviewed with MD and verbal orders received and placed for pt to be prescribed Tessalon  Pearls as well as a Zpak.  Pt also educated on OTC Mucinex DM. Pt verbalized understanding.

## 2024-06-29 ENCOUNTER — Other Ambulatory Visit: Payer: Self-pay | Admitting: *Deleted

## 2024-06-29 ENCOUNTER — Ambulatory Visit: Payer: Self-pay | Admitting: Surgery

## 2024-06-29 ENCOUNTER — Telehealth: Payer: Self-pay | Admitting: *Deleted

## 2024-06-29 LAB — DERMATOLOGY PATHOLOGY

## 2024-06-29 MED ORDER — OSELTAMIVIR PHOSPHATE 75 MG PO CAPS: 75.0000 mg | ORAL_CAPSULE | Freq: Every day | ORAL | 0 refills | Status: DC

## 2024-06-29 NOTE — Telephone Encounter (Signed)
 Pt called stating that he is having influenza A symptoms that son has acquired. Request for Tamiflu  was sent in to pt pharmacy. Pt made aware.

## 2024-06-30 ENCOUNTER — Encounter: Payer: Self-pay | Admitting: *Deleted

## 2024-06-30 ENCOUNTER — Inpatient Hospital Stay: Admitting: Hematology and Oncology

## 2024-06-30 DIAGNOSIS — C50422 Malignant neoplasm of upper-outer quadrant of left male breast: Secondary | ICD-10-CM | POA: Diagnosis not present

## 2024-06-30 DIAGNOSIS — Z170 Estrogen receptor positive status [ER+]: Secondary | ICD-10-CM | POA: Diagnosis not present

## 2024-06-30 NOTE — Progress Notes (Signed)
 Per MD order fax submitted to [facility] for molecular profiling test.

## 2024-06-30 NOTE — Progress Notes (Signed)
 Per MD request, RN contacted [facility] to add breast prognostic to recent path report.

## 2024-06-30 NOTE — Progress Notes (Signed)
 HEMATOLOGY-ONCOLOGY TELEPHONE VISIT PROGRESS NOTE  I connected with our patient on 06/30/2024 at 11:00 AM EST by telephone and verified that I am speaking with the correct person using two identifiers.  I discussed the limitations, risks, security and privacy concerns of performing an evaluation and management service by telephone and the availability of in person appointments.  I also discussed with the patient that there may be a patient responsible charge related to this service. The patient expressed understanding and agreed to proceed.   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  He recently had biopsy of a new lesion in the upper-outer quadrant of the left breast that confirmed recurrent invasive ductal carcinoma.  This recurrence occurred while on tamoxifen  and abemaciclib . He has no new symptoms, treatment side effects, or acute complaints since the last visit.  Molecular analysis and mutation testing on the recurrence specimen are pending to identify actionable mutations and guide further therapy.      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 CT 08/27/2021 in ED showed 3.8 centimeter left breast mass with left axillary lymph nodes.Mamm and US : 4 cm mass with 5 axillary lymph nodes: Biopsy: Grade 2 IDC, lymph node: Positive, ER 100%, PR 50%, Ki-67 25%, HER2 negative ratio 1.25, copy #3.45   09/25/2021 Cancer Staging   Staging form: Breast, AJCC 8th Edition - Clinical: Stage IIA (cT2, cN1, cM0, G2, ER+, PR+, HER2-) - Signed by Odean Potts, MD on 09/25/2021 Histologic grading system: 3 grade system    10/03/2021 - 01/18/2022 Chemotherapy   Patient is on Treatment Plan : BREAST TC q21d     01/22/2022 Genetic Testing   Negative genetic testing on the CancerNext-Expanded+RNAinsight.  The report date is January 22, 2022.  The CancerNext-Expanded gene panel offered by [facility] and includes sequencing and rearrangement analysis for the following 77 genes: AIP, ALK, APC*, ATM*, AXIN2, BAP1, BARD1, BLM, BMPR1A, BRCA1*, BRCA2*, BRIP1*, CDC73, CDH1*, CDK4, CDKN1B, CDKN2A, CHEK2*, CTNNA1, DICER1, FANCC, FH, FLCN, GALNT12, KIF1B, LZTR1, MAX, MEN1, MET, MLH1*, MSH2*, MSH3, MSH6*, MUTYH*, NBN, NF1*, NF2, NTHL1, PALB2*, PHOX2B, PMS2*, POT1, PRKAR1A, PTCH1, PTEN*, RAD51C*, RAD51D*, RB1, RECQL, RET, SDHA, SDHAF2, SDHB, SDHC, SDHD, SMAD4, SMARCA4, SMARCB1, SMARCE1, STK11, SUFU, TMEM127, TP53*, TSC1, TSC2, VHL and XRCC2 (sequencing and deletion/duplication); EGFR, EGLN1, HOXB13, KIT, MITF, PDGFRA, POLD1, and POLE (sequencing only); EPCAM and GREM1 (deletion/duplication only). DNA and RNA analyses performed for * genes.      REVIEW OF SYSTEMS:   Constitutional: Denies fevers, chills or abnormal weight loss All other systems were reviewed with the patient and are negative. 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 CT 08/27/2021 in ED showed 3.8 cm left breast mass with left axillary lymph nodes.Mamm and US : 4 cm mass with 5 axillary lymph nodes: Biopsy: Grade 2 IDC, lymph node: Positive, ER 100%, PR 50%, Ki-67 25%, HER2 negative ratio 1.25, copy #3.45 (Recent history of coronary artery disease EF 22% and stroke)   Treatment plan: 1. Neoadjuvant chemotherapy with Taxotere  and Cytoxan  every 3 weeks x6 cycles completed 01/16/2022 2. left mastectomy: Residual  grade 2 IDC 3.5 cm, margins negative, 2/2 lymph nodes positive, angiolymphatic invasion present, ER 100%, PR  50%, HER2 negative, Ki-67 25% 3. Followed by adjuvant radiation therapy 04/03/22-05/16/22 4.  Followed by adjuvant antiestrogen therapy with tamoxifen  started November 2023 5. Signatera test positive (0.3)  06/26/2023: Bone scan: Negative, CT CAP: Enlarged left supraclavicular lymph node 2.1 cm, (used to be 0.8 cm) new tiny scattered nonspecific lung nodules (4 mm and 3 mm) -------------------------------------------------------------------------------------------------------------------- 07/16/2023: Biopsy left supraclavicular lymph node: Metastatic poorly differentiated carcinoma consistent with breast ER 75%, PR 75%, Ki67 40%, HER2 1+ 08/23/2023: Left neck dissection: 3/19 lymph nodes positive consistent with breast primary, extranodal extension on 3 lymph nodes, lymphovascular invasion identified   Current treatment: Tamoxifen  with Verzinio (100 p.o. twice daily)   Verzinio toxicities: Severe fatigue: Doing significantly better on Verzinio dosage of 100 mg twice daily.   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  It is not improving.  I sent a prescription for Ultram .  Previously tried gabapentin  and Lyrica  and both have not worked.     CT CAP 11/23/2023: No evidence of metastatic disease.  Previously noted lung nodules appear similar or resolved. CT CAP: 06/02/24: New and enlarging pulmonary nodules, 5 mm right middle lobe nodule (was 1 to 2 mm previously) anterior to this 4 mm nodule (new), 2 to 3 mm left lower lobe lung nodule (new) right lower lobe 5 mm nodule is also new.  No other distant metastatic disease.  Clavicle area lump: Biopsy 06/22/2024: Metastatic adenocarcinoma consistent with breast primary positive for ER/PR.  Complete prognostic panel will be ordered as well as Caris mutation testing Requested Dr. Vanderbilt to remove this mass I will call the patient back when I have the prognostic panel and the Caris testing. We might have to change his  treatment based upon the results.   He is going to [place] for work multiple times throughout next year.  Recommendation: Repeat a CT chest in 3 months.   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 Molecular analysis pending for targeted therapy options. - Coordinated surgical excision with Dr. Cornette for local control and additional tissue for analysis. - Requested mutation testing on biopsy specimen; will use surgical specimen if needed. - Continued antiestrogen therapy until molecular results indicate therapy change. - Will communicate molecular results and next steps post-analysis.      I discussed the assessment and treatment plan with the patient. The patient was provided an opportunity to ask questions and all were answered. The patient agreed with the plan and demonstrated an understanding of the instructions. The patient was advised to call back or seek an in-person evaluation if the symptoms worsen or if the condition fails to improve as anticipated.   I provided 20 minutes of non-face-to-face time during this encounter.  This includes time for charting and coordination of care   Naomi MARLA Chad, MD

## 2024-06-30 NOTE — Assessment & Plan Note (Signed)
 09/15/2021:Palpable left breast mass x8 months. CT 08/27/2021 in ED showed 3.8 cm left breast mass with left axillary lymph nodes.Mamm and US : 4 cm mass with 5 axillary lymph nodes: Biopsy: Grade 2 IDC, lymph node: Positive, ER 100%, PR 50%, Ki-67 25%, HER2 negative ratio 1.25, copy #3.45 (Recent history of coronary artery disease EF 22% and stroke)   Treatment plan: 1. Neoadjuvant chemotherapy with Taxotere  and Cytoxan  every 3 weeks x6 cycles completed 01/16/2022 2. left mastectomy: Residual grade 2 IDC 3.5 cm, margins negative, 2/2 lymph nodes positive, angiolymphatic invasion present, ER 100%, PR 50%, HER2 negative, Ki-67 25% 3. Followed by adjuvant radiation therapy 04/03/22-05/16/22 4.  Followed by adjuvant antiestrogen therapy with tamoxifen  started November 2023 5. Signatera test positive (0.3)  06/26/2023: Bone scan: Negative, CT CAP: Enlarged left supraclavicular lymph node 2.1 cm, (used to be 0.8 cm) new tiny scattered nonspecific lung nodules (4 mm and 3 mm) -------------------------------------------------------------------------------------------------------------------- 07/16/2023: Biopsy left supraclavicular lymph node: Metastatic poorly differentiated carcinoma consistent with breast ER 75%, PR 75%, Ki67 40%, HER2 1+ 08/23/2023: Left neck dissection: 3/19 lymph nodes positive consistent with breast primary, extranodal extension on 3 lymph nodes, lymphovascular invasion identified   Current treatment: Tamoxifen  with Verzinio (100 p.o. twice daily)   Verzinio toxicities: Severe fatigue: Doing significantly better on Verzinio dosage of 100 mg twice daily.   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  It is not improving.  I sent a prescription for Ultram .  Previously tried gabapentin  and Lyrica  and both have not worked.     CT CAP 11/23/2023: No evidence of metastatic disease.  Previously noted lung nodules appear similar or resolved. CT CAP: 06/02/24:  New and enlarging pulmonary nodules, 5 mm right middle lobe nodule (was 1 to 2 mm previously) anterior to this 4 mm nodule (new), 2 to 3 mm left lower lobe lung nodule (new) right lower lobe 5 mm nodule is also new.  No other distant metastatic disease.  Clavicle area lump: Biopsy 06/22/2024: Metastatic adenocarcinoma consistent with breast primary positive for ER/PR.  Complete prognostic panel will be ordered as well as Caris mutation testing   He is going to [place] for work multiple times throughout next year.  Recommendation: Repeat a CT chest in 3 months.

## 2024-07-10 ENCOUNTER — Encounter: Payer: Self-pay | Admitting: Hematology and Oncology

## 2024-07-10 NOTE — Progress Notes (Signed)
 01/14/24 Echo reviewed with Dr Paul. EF 30-35%. Pt is followed by Dr Rolan for  CHF,  nonischemic cardiomyopathy. OV from 11/04/23 also reviewed. Pt's upcoming surgery will need to be done at the main OR- Maddie RN at Dr Cornett's office notified. She stated that she will let Dr Vanderbilt and his surgery scheduler know.

## 2024-07-14 ENCOUNTER — Encounter (HOSPITAL_COMMUNITY): Payer: Self-pay | Admitting: Surgery

## 2024-07-14 ENCOUNTER — Other Ambulatory Visit: Payer: Self-pay

## 2024-07-14 NOTE — Progress Notes (Signed)
 SDW call  Patient was given pre-op instructions over the phone. Patient verbalized understanding of instructions provided.  Denied any SOB, fever or cough  PCP - denies Cardiologist -  Dr. Rolan Oncologist: Dr. Mackey Chad Pulmonary:    PPM/ICD - denies Device Orders - na Rep Notified - na   Chest x-ray -  EKG -  11/04/2023 Stress Test - ECHO - 10/15/2023 Cardiac Cath - 08/28/2021  Sleep Study/sleep apnea/CPAP: denies  Non-diabetic  Blood Thinner Instructions: denies Aspirin  Instructions:denies   ERAS Protcol - Clears until 1015   Anesthesia review: Yes. Myocarditis, CHF, MI, stroke, EF 30-35%  Your procedure is scheduled on Thursday July 16, 2024  Report to [facility] Main Entrance A at  1045  A.M., then check in with the Admitting office.  Call this number if you have problems the morning of surgery:  [phone]   If you have any questions prior to your surgery date call [phone]: Open Monday-Friday 8am-4pm If you experience any cold or flu symptoms such as cough, fever, chills, shortness of breath, etc. between now and your scheduled surgery, please notify us  at the above number    Remember:  Do not eat after midnight the night before your surgery  You may drink clear liquids until  1015   the morning of your surgery.   Clear liquids allowed are: Water, Non-Citrus Juices (without pulp), Carbonated Beverages, Clear Tea, Black Coffee ONLY (NO MILK, CREAM OR POWDERED CREAMER of any kind), and Gatorade   Take these medicines the morning of surgery with A SIP OF WATER:  Abemaciclb, atorvastatin , farxiga , metoprolol , tamoxifen   As needed: Tessalon , tramadol   As of today, STOP taking any Aspirin  (unless otherwise instructed by your surgeon) Aleve, Naproxen, Ibuprofen , Motrin , Advil , Goody's, BC's, all herbal medications, fish oil, and all vitamins.

## 2024-07-15 ENCOUNTER — Other Ambulatory Visit (HOSPITAL_COMMUNITY): Payer: Self-pay

## 2024-07-15 ENCOUNTER — Encounter: Payer: Self-pay | Admitting: Hematology and Oncology

## 2024-07-15 ENCOUNTER — Telehealth: Payer: Self-pay

## 2024-07-15 ENCOUNTER — Inpatient Hospital Stay: Attending: Hematology and Oncology | Admitting: Hematology and Oncology

## 2024-07-15 DIAGNOSIS — C7951 Secondary malignant neoplasm of bone: Secondary | ICD-10-CM | POA: Insufficient documentation

## 2024-07-15 DIAGNOSIS — C770 Secondary and unspecified malignant neoplasm of lymph nodes of head, face and neck: Secondary | ICD-10-CM | POA: Insufficient documentation

## 2024-07-15 DIAGNOSIS — Z9221 Personal history of antineoplastic chemotherapy: Secondary | ICD-10-CM | POA: Insufficient documentation

## 2024-07-15 DIAGNOSIS — Z1721 Progesterone receptor positive status: Secondary | ICD-10-CM | POA: Insufficient documentation

## 2024-07-15 DIAGNOSIS — Z79899 Other long term (current) drug therapy: Secondary | ICD-10-CM | POA: Insufficient documentation

## 2024-07-15 DIAGNOSIS — C50422 Malignant neoplasm of upper-outer quadrant of left male breast: Secondary | ICD-10-CM | POA: Diagnosis not present

## 2024-07-15 DIAGNOSIS — Z923 Personal history of irradiation: Secondary | ICD-10-CM | POA: Insufficient documentation

## 2024-07-15 DIAGNOSIS — Z9012 Acquired absence of left breast and nipple: Secondary | ICD-10-CM | POA: Insufficient documentation

## 2024-07-15 DIAGNOSIS — Z79810 Long term (current) use of selective estrogen receptor modulators (SERMs): Secondary | ICD-10-CM | POA: Insufficient documentation

## 2024-07-15 DIAGNOSIS — Z170 Estrogen receptor positive status [ER+]: Secondary | ICD-10-CM | POA: Diagnosis not present

## 2024-07-15 DIAGNOSIS — C7800 Secondary malignant neoplasm of unspecified lung: Secondary | ICD-10-CM | POA: Insufficient documentation

## 2024-07-15 DIAGNOSIS — Z1732 Human epidermal growth factor receptor 2 negative status: Secondary | ICD-10-CM | POA: Insufficient documentation

## 2024-07-15 DIAGNOSIS — G893 Neoplasm related pain (acute) (chronic): Secondary | ICD-10-CM | POA: Insufficient documentation

## 2024-07-15 MED ORDER — OXYCODONE-ACETAMINOPHEN 5-325 MG PO TABS: 1.0000 | ORAL_TABLET | Freq: Three times a day (TID) | ORAL | 0 refills | Status: DC | PRN

## 2024-07-15 MED ORDER — TALAZOPARIB TOSYLATE 1 MG PO CAPS: 1.0000 mg | ORAL_CAPSULE | Freq: Every day | ORAL | 3 refills | Status: DC

## 2024-07-15 NOTE — Anesthesia Preprocedure Evaluation (Addendum)
"                                    Anesthesia Evaluation  Patient identified by MRN, date of birth, ID band Patient awake    Reviewed: Allergy & Precautions, NPO status , Patient's Chart, lab work & pertinent test results, reviewed documented beta blocker date and time   History of Anesthesia Complications Negative for: history of anesthetic complications  Airway Mallampati: III  TM Distance: >3 FB     Dental no notable dental hx.    Pulmonary neg shortness of breath, pneumonia, former smoker   breath sounds clear to auscultation       Cardiovascular + Past MI and +CHF  (-) Cardiac Stents and (-) CABG (-) dysrhythmias (-) pacemaker(-) Cardiac Defibrillator  Rhythm:Regular Rate:Normal  IMPRESSIONS     1. Global hypokinesis worse in the basal septum. Left ventricular  ejection fraction, by estimation, is 30 to 35%. The left ventricle has  moderately decreased function. The left ventricle demonstrates global  hypokinesis. The left ventricular internal  cavity size was mildly dilated. There is mild left ventricular  hypertrophy. Left ventricular diastolic parameters are consistent with  Grade I diastolic dysfunction (impaired relaxation).   2. Right ventricular systolic function is normal. The right ventricular  size is normal.   3. The mitral valve is abnormal. Trivial mitral valve regurgitation. No  evidence of mitral stenosis.   4. The aortic valve is tricuspid. Aortic valve regurgitation is not  visualized. No aortic stenosis is present.   5. The inferior vena cava is normal in size with greater than 50%  respiratory variability, suggesting right atrial pressure of 3 mmHg.     Neuro/Psych neg Seizures CVA, No Residual Symptoms    GI/Hepatic ,neg GERD  ,,(+) neg Cirrhosis        Endo/Other    Class 4 obesity  Renal/GU Renal disease     Musculoskeletal   Abdominal   Peds  Hematology   Anesthesia Other Findings   Reproductive/Obstetrics                               Anesthesia Physical Anesthesia Plan  ASA: 4  Anesthesia Plan: General   Post-op Pain Management:    Induction: Intravenous  PONV Risk Score and Plan: 2 and Ondansetron  and Dexamethasone   Airway Management Planned: LMA  Additional Equipment:   Intra-op Plan:   Post-operative Plan: Extubation in OR  Informed Consent: I have reviewed the patients History and Physical, chart, labs and discussed the procedure including the risks, benefits and alternatives for the proposed anesthesia with the patient or authorized representative who has indicated his/her understanding and acceptance.       Plan Discussed with: CRNA  Anesthesia Plan Comments: (PAT note written 07/15/2024 by Allison Zelenak, PA-C.  )         Anesthesia Quick Evaluation  "

## 2024-07-15 NOTE — Progress Notes (Signed)
 Anesthesia Chart Review: SAME DAY WORK-UP  Case: [id number] Date/Time: 07/16/24 1300   Procedure: EXCISION, LESION, TORSO (Left) - excision left chest skin lesion   Anesthesia type: General   Pre-op diagnosis: skin lesion   [facility]: [facility] ROOM 20 / [facility]   Surgeons: Vanderbilt Ned, MD       DISCUSSION: Patient is a 44 year old male scheduled for the above procedure. Case moved from [facility] due to NICM with EF 30-35%.   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   S/p 06/22/2024 left clavicular region LN biopsy that showed metastatic adenocarcinoma consistent with breast primary positive for ER/PR.  Oncology is sending a complete prognostic panel as well as Caris mutation testing, and he was referred to Dr. Vanderbilt to remove the mass.    [place] admission 08/27/2021 - 08/31/2021 for prolonged URI symptoms for ~ 6 weeks. He had completed 2 courses of antibiotics but with persistent symptoms with dyspnea and tachycardia.  CTA was negative for PE but noted to have an incidental finding of a left breast mass concerning for malignancy. Echo showed severe LV dysfunction with EF 20-25%, global hypokinesis, grade 3 diastolic dysfunction, and findings concerning for LV thrombus or mass. 08/28/2021 RHC/LHC showed normal coronaries, decompensated NICM  Cardiac MRI suggested likely acute myocarditis. He responded to IV diuresis and was started on GDMT.  Anticoagulation therapy with warfarin initiated with heparin /Lovenox  bridging. LifeVest arranged as well as out-patient cardiology and oncology follow-up. 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 Follow-up echocardiograms 11/21/2021 and 10/10/2022 showed improved LVEF to 50-55%. Anticoagulation therapy was discontinued after EF improvement. He underwent he underwent chemotherapy followed by left modified radical mastectomy on 02/21/2022. He completed radiation 05/16/2022 and was started on Tamoxifen . Bone scan on 06/26/2023 showed no osseous metastatic disease, but CT showed enlarged left supraclavicular lymph node concerning for nodal metastatic disease. There was also new tiny scatted non-specific pulmonary nodules with short term interval follow-up suggested. 08/01/2023 neck CT showed a nearly 3 cm left subclavicular mass likely a nodal metastasis with extracapsular extension and multiple small but asymmetric and suspicious surrounding lymph nodes.  No other metastatic disease identified in the neck.  S/p left neck radical lymph node dissection 08/23/2023 by Dr. Ida Loader.    Since then he has had HF cardiology follow-up with Dr. Rolan on 09/23/2023 and with Glena Raisin, FNP on 11/04/2023. Echo was updated on 4//2025 and showed LVEF down to 30-35%, normal RV. He had been on Entresto  and Inspra . As above, HFrEF thought likely due to viral myocarditis in 08/2021, less likely sarcoidosis. Normal coronaries 08/2021. He was continued on Toprol , Farxiga , and restarted on Entresto  and Inspra . At April follow-up, Toprol  increased to 75 mg daily for tachycardia. 2 week Zio monitor planned, but does not appear to have been completed. He was not volume overloaded at that time. Repeat echo ~ 3-4 months planned, but is not scheduled yet.   He is on Verzenio  and tamoxifen  per Dr. Odean.  Anesthesia team to evaluate on the day of surgery.  Updated labs indicated on  arrival.   VS: Ht 6' 3 (1.905 m)   Wt (!) 142.9 kg   BMI 39.37 kg/m  Wt Readings from Last 3 Encounters:  03/02/24 (!) 140.6 kg  11/04/23 (!) 141.8 kg  10/21/23 (!) 142.3 kg   BP Readings from Last 3 Encounters:  03/02/24 (!) 142/92  11/27/23 (!) 148/88  11/04/23 126/80   Pulse Readings from Last 3 Encounters:  03/02/24 (!) 110  11/27/23 84  11/04/23 (!) 117     PROVIDERS: Pcp, No Rolan Barrack, MD is HF cardiologist Elmira Penman, MD is primary cardiologist Odean Potts, MD is HEM-ONC Dewey Rush, MD is RAD-ONC Jesus Oliphant, MD is ENT   LABS: For day of surgery as indicated. Most recent results in [facility] include: Lab Results  Component Value Date   WBC 5.8 06/02/2024   HGB 15.3 06/02/2024   HCT 44.2 06/02/2024   PLT 221 06/02/2024   GLUCOSE 152 (H) 06/02/2024   CHOL 95 10/07/2023   TRIG 142 10/07/2023   HDL 39 (L) 10/07/2023   LDLCALC 28 10/07/2023   ALT 44 06/02/2024   AST 38 06/02/2024   NA 139 06/02/2024   K 4.2 06/02/2024   CL 104 06/02/2024   CREATININE 1.19 06/02/2024   BUN 11 06/02/2024   CO2 26 06/02/2024     IMAGES: CT Chest/abd/pelvis 06/02/2024: IMPRESSION: 1. New and enlarged tiny pulmonary nodules, highly suspicious for metastatic disease. 2. Left mastectomy and node dissection, with otherwise no evidence of metastatic disease. 3. Hepatic steatosis and hepatomegaly. 4. Aortic atherosclerosis (icd10-i70.0). This is age advanced.   CT Soft tissue neck (pre neck dissection) 08/01/2023: IMPRESSION: 1. Relatively large, nearly 3 cm left supraclavicular fossa rounded and spiculated soft tissue mass is most likely a nodal metastasis with extra-capsular extension. Multiple smaller but asymmetric and suspicious surrounding lymph nodes there. All other cervical nodes remain within normal limits by imaging criteria. 2. No other metastatic disease identified in the Neck. 3. 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   Bone Scan  06/26/2023: IMPRESSION: No convincing scintigraphic evidence of osteoblastic osseous metastatic disease.   MRI Brain 09/01/2021: IMPRESSION: - Patchy acute and subacute infarcts as described. Largest area of involvement is in the left frontal lobe corresponding to abnormality on CT where there is some petechial hemorrhage. Involvement of multiple vascular territories suggesting a central source. - No intracranial mass.     EKG: EKG 11/04/2023: Sinus tachycardia at 114 bpm Otherwise normal ECG When compared with ECG of 23-Sep-2023 13:51, No significant change was found Confirmed by Alveta Mungo [phone]) on 11/04/2023 1:10:25 PM    CV: Long term monitor 11/04/2023: Incomplete.  Echo 10/15/2023: IMPRESSIONS   1. Global hypokinesis worse in the basal septum. Left ventricular  ejection fraction, by estimation, is 30 to 35%. The left ventricle has  moderately decreased function. The left ventricle demonstrates global  hypokinesis. The left ventricular internal  cavity size was mildly dilated. There is mild left ventricular  hypertrophy. Left ventricular diastolic parameters are consistent with  Grade I diastolic dysfunction (impaired relaxation).   2. Right ventricular systolic function is normal. The right ventricular  size is normal.   3. The mitral valve is abnormal. Trivial mitral valve regurgitation. No  evidence of mitral stenosis.   4. The aortic valve is tricuspid. Aortic valve regurgitation is not  visualized. No aortic stenosis is present.   5. The inferior vena cava is normal in size with greater than 50%  respiratory variability, suggesting  right atrial pressure of 3 mmHg.  - Comparison 10/10/2022: LVEF 50-55%, mild concentric LVH, normal global wall motion, trace TR, no pulmonary hypertension; 11/21/21: LVEF 50-55%, moderate concentric LVH, grade 1 DD; 08/27/21: dilated LV/RV with LVEF 20-25%, LV apical thrombus, dilated RA/RV, mild MR     US  Carotid 09/02/2021: Summary:  -  Right Carotid: The extracranial vessels were near-normal with only minimal wall thickening or plaque.  - Left Carotid: The extracranial vessels were near-normal with only minimal wall thickening or plaque.  - Vertebrals:  Bilateral vertebral arteries demonstrate antegrade flow.  - Subclavians: Normal flow hemodynamics were seen in bilateral subclavian arteries.      MRI Cardiac 08/29/2021: IMPRESSION: 1.  Left breast mass as seen on prior CT.  Ongoing workup. 2. Severely dilated LV with mild concentric LV hypertrophy. EF 10% with global hypokinesis. 3. 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 The patient is on heparin  gtt. 4.  Mildly dilated RV with EF 15%. 5. Delayed enhancement pattern as above. The inferolateral LGE could be a OM territory infarct, but corresponding lesion not seen by cath. Overall, most likely acute myocarditis given subepicardial apical inferior LGE also (the RV insertion site LGE can be nonspecific in the setting of volume/pressure overload). Elevated T2 in the apical inferior wall is more suggestive of acute myocarditis. 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 - Would consider CHF service/clinic followup given profound biventricular cardiomyopathy in young patient.     RHC/LHC 08/28/2021: Normal coronary arteries without coronary artery disease   RA: 10 mmHg RV: 49/4 mmHg PA: 44/24 mmHg, mPAP 34 mmHg PCW: 22 mmHg   CO: 5.7 L/min CI: 2.3 L/min/m2   Decompensated nonischemic cardiomyopathy Resume IV heparin  2 hours after TR band is off given LV apical thrombus GDMT for heart failure and continued workup for breast mass  Past Medical History:  Diagnosis Date   Breast cancer (HCC)    CHF (congestive heart failure) (HCC)    Left ventricular thrombus 08/2021   in setting of NICM, BiV HF likely from acute myocarditis, s/p anticoagulation ~ 3 months   Myocardial infarction [facility])    Myocarditis (HCC) 08/2021   Pneumonia    Stroke (HCC) 09/01/2021   in setting of LV  thrombus    Past Surgical History:  Procedure Laterality Date   IR IMAGING GUIDED PORT INSERTION  09/28/2021   MASTECTOMY W/ SENTINEL NODE BIOPSY Left 02/21/2022   Procedure: LEFT SIMPLE MASTECTOMY WITH LEFT TARGETED LYMPH NODE BIOPSY;  Surgeon: Vanderbilt Ned, MD;  Location: [facility] OR;  Service: General;  Laterality: Left;  GEN w/ PEC BLOCK   PORT-A-CATH REMOVAL Right 02/21/2022   Procedure: PORT REMOVAL;  Surgeon: Vanderbilt Ned, MD;  Location: [facility] OR;  Service: General;  Laterality: Right;   RADICAL NECK DISSECTION Left 08/23/2023   Procedure: RADICAL NECK DISSECTION;  Surgeon: Jesus Oliphant, MD;  Location: [facility] OR;  Service: ENT;  Laterality: Left;   RIGHT/LEFT HEART CATH AND CORONARY ANGIOGRAPHY N/A 08/28/2021   Procedure: RIGHT/LEFT HEART CATH AND CORONARY ANGIOGRAPHY;  Surgeon: Elmira Newman PARAS, MD;  Location: [facility] INVASIVE CV LAB;  Service: Cardiovascular;  Laterality: N/A;    MEDICATIONS:  abemaciclib  (VERZENIO ) 100 MG tablet   atorvastatin  (LIPITOR) 20 MG tablet   azithromycin  (ZITHROMAX ) 250 MG tablet   benzonatate  (TESSALON ) 200 MG capsule   dapagliflozin  propanediol (FARXIGA ) 10 MG TABS tablet   diphenhydramine -acetaminophen  (TYLENOL  PM) 25-500 MG TABS tablet   doxycycline  (VIBRA -TABS) 100 MG tablet   eplerenone  (INSPRA ) 50 MG tablet   metoprolol  succinate (  TOPROL -XL) 50 MG 24 hr tablet   ondansetron  (ZOFRAN -ODT) 4 MG disintegrating tablet   oseltamivir  (TAMIFLU ) 75 MG capsule   Pyridoxine HCl (VITAMIN B-6 PO)   sacubitril -valsartan  (ENTRESTO ) 49-51 MG   tamoxifen  (NOLVADEX ) 20 MG tablet   traMADol  (ULTRAM ) 50 MG tablet   By medication list, he is not currently taking doxycycline , azithromycin , Zofran , Tamiflu  (for flu A 06/29/2024).   Michael Ruder, PA-C Surgical Short Stay/Anesthesiology [facility] Phone [phone] [facility] Phone [phone] 07/15/2024 12:43 PM

## 2024-07-15 NOTE — Telephone Encounter (Signed)
 Oral Oncology Patient Advocate Encounter   Received notification that prior authorization for Talzenna  is required.   PA submitted on 07/15/2024 Key [id number] Status is pending      Charlott Hamilton,  CPhT-Adv  she/her/hers [facility]  [facility] Pharmacy Technician Patient Advocate Specialist III [facility] Phone: [phone]  Fax: [phone] [email or web address]

## 2024-07-15 NOTE — Assessment & Plan Note (Signed)
 09/15/2021:Palpable left breast mass x8 months. CT 08/27/2021 in ED showed 3.8 cm left breast mass with left axillary lymph nodes.Mamm and US : 4 cm mass with 5 axillary lymph nodes: Biopsy: Grade 2 IDC, lymph node: Positive, ER 100%, PR 50%, Ki-67 25%, HER2 negative ratio 1.25, copy #3.45 (Recent history of coronary artery disease EF 22% and stroke)   Treatment summary: 1. Neoadjuvant chemotherapy with Taxotere  and Cytoxan  every 3 weeks x6 cycles completed 01/16/2022 2. left mastectomy: Residual grade 2 IDC 3.5 cm, margins negative, 2/2 lymph nodes positive, angiolymphatic invasion present, ER 100%, PR 50%, HER2 negative, Ki-67 25% 3. Followed by adjuvant radiation therapy 04/03/22-05/16/22 4.  Followed by adjuvant antiestrogen therapy with tamoxifen  started November 2023 5. Signatera test positive (0.3)  6. 06/26/2023: Bone scan: Negative, CT CAP: Enlarged left supraclavicular lymph node 2.1 cm, (used to be 0.8 cm) new tiny scattered nonspecific lung nodules (4 mm and 3 mm) 7. 07/16/2023: Biopsy left supraclavicular lymph node: Metastatic poorly differentiated carcinoma consistent with breast ER 75%, PR 75%, Ki67 40%, HER2 1+ 8. 08/23/2023: Left neck dissection: 3/19 lymph nodes positive consistent with breast primary, extranodal extension on 3 lymph nodes, lymphovascular invasion identified 9. Tamoxifen  with Verzinio (100 p.o. twice daily) discontinued 07/15/2024 10.  06/08/2024: CT CAP: Pulmonary nodules 11.  Clavicular mass: Biopsy: Metastatic adenocarcinoma consistent with breast primary positive for ER/PR. 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  Send a prescription for Percocets because Ultram  is not working  Telephone appointment for chemo education with Norleen regarding Talazoparib Return to clinic in 2 weeks for labs and  follow-up

## 2024-07-15 NOTE — Progress Notes (Signed)
 "  Telephone visit:  Patient Care Team: Pcp, No as PCP - General Michael Nanetta SAILOR, RN as Oncology Nurse Navigator Michael Stuart (Neurology) Michael Potts, MD as Medical Oncologist (Hematology and Oncology)  DIAGNOSIS:  Encounter Diagnosis  Name Primary?   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 CT 08/27/2021 in ED showed 3.8 centimeter left breast mass with left axillary lymph nodes.Mamm and US : 4 cm mass with 5 axillary lymph nodes: Biopsy: Grade 2 IDC, lymph node: Positive, ER 100%, PR 50%, Ki-67 25%, HER2 negative ratio 1.25, copy #3.45   09/25/2021 Cancer Staging   Staging form: Breast, AJCC 8th Edition - Clinical: Stage IIA (cT2, cN1, cM0, G2, ER+, PR+, HER2-) - Signed by Michael Potts, MD on 09/25/2021 Histologic grading system: 3 grade system   10/03/2021 - 01/18/2022 Chemotherapy   Patient is on Treatment Plan : BREAST TC q21d     01/22/2022 Genetic Testing   Negative genetic testing on the CancerNext-Expanded+RNAinsight.  The report date is January 22, 2022.  The CancerNext-Expanded gene panel offered by [facility] and includes sequencing and rearrangement analysis for the following 77 genes: AIP, ALK, APC*, ATM*, AXIN2, BAP1, BARD1, BLM, BMPR1A, BRCA1*, BRCA2*, BRIP1*, CDC73, CDH1*, CDK4, CDKN1B, CDKN2A, CHEK2*, CTNNA1, DICER1, FANCC, FH, FLCN, GALNT12, KIF1B, LZTR1, MAX, MEN1, MET, MLH1*, MSH2*, MSH3, MSH6*, MUTYH*, NBN, NF1*, NF2, NTHL1, PALB2*, PHOX2B, PMS2*, POT1, PRKAR1A, PTCH1, PTEN*, RAD51C*, RAD51D*, RB1, RECQL, RET, SDHA, SDHAF2, SDHB, SDHC, SDHD, SMAD4, SMARCA4, SMARCB1, SMARCE1, STK11, SUFU, TMEM127, TP53*, TSC1, TSC2, VHL and XRCC2 (sequencing and deletion/duplication); EGFR,  EGLN1, HOXB13, KIT, MITF, PDGFRA, POLD1, and POLE (sequencing only); EPCAM and GREM1 (deletion/duplication only). DNA and RNA analyses performed for * genes.      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  He has progressive metastatic breast cancer with new and enlarging pulmonary nodules and biopsy-confirmed metastatic disease in the clavicle and supraclavicular lymph nodes despite abemaciclib  and tamoxifen . He is scheduled for surgical excision of a recurrent/metastatic lesion tomorrow.  Recent molecular profiling showed actionable BRCA2 and PIK3CA mutations despite prior negative germline testing. Next-line options, including talazoparib, were discussed. He has tolerated prior chemotherapy and systemic therapies with normal blood counts, mild intermittent hyperglycemia, and persistent fatigue attributed to prior therapies.  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 He wishes to avoid uncontrolled pain and is open to stronger analgesia but is cautious with narcotics based on prior experience.  He is planning international travel in late January and February, including to the [place] and [place], and needs coordination of laboratory monitoring for cytopenias and other potential toxicities if started on talazoparib.       ALLERGIES:  is allergic to bactrim [sulfamethoxazole-trimethoprim] and ovace plus wash [sulfacetamide sodium].  MEDICATIONS:  Current Outpatient Medications  Medication Sig Dispense Refill   oxyCODONE -acetaminophen  (PERCOCET/ROXICET) 5-325 MG tablet Take 1 tablet by  mouth every 8 (eight) hours as needed for severe pain (  pain score 7-10). 30 tablet 0   talazoparib tosylate  (TALZENNA ) 1 MG capsule Take 1 capsule (1 mg total) by mouth daily. 30 capsule 3   abemaciclib  (VERZENIO ) 100 MG tablet Take 1 tablet (100 mg total) by mouth 2 (two) times daily. 60 tablet 6   atorvastatin  (LIPITOR) 20 MG tablet TAKE 1 TABLET BY MOUTH EVERY DAY 15 tablet 0   azithromycin  (ZITHROMAX ) 250 MG tablet Take as directed (Patient not taking: Reported on 07/14/2024) 6 each 0   benzonatate  (TESSALON ) 200 MG capsule Take 1 capsule (200 mg total) by mouth 3 (three) times daily as needed for cough. 20 capsule 0   dapagliflozin  propanediol (FARXIGA ) 10 MG TABS tablet Take 1 tablet (10 mg total) by mouth daily before breakfast. 90 tablet 3   diphenhydramine -acetaminophen  (TYLENOL  PM) 25-500 MG TABS tablet Take 3 tablets by mouth at bedtime.     doxycycline  (VIBRA -TABS) 100 MG tablet Take 1 tablet (100 mg total) by mouth 2 (two) times daily. (Patient not taking: Reported on 07/14/2024) 14 tablet 0   eplerenone  (INSPRA ) 50 MG tablet Take 1 tablet by mouth daily. 90 tablet 3   metoprolol  succinate (TOPROL -XL) 50 MG 24 hr tablet Take 1.5 tablets (75 mg total) by mouth daily. 135 tablet 3   ondansetron  (ZOFRAN -ODT) 4 MG disintegrating tablet Take 1 tablet (4 mg total) by mouth every 8 (eight) hours as needed for nausea or vomiting. (Patient not taking: Reported on 11/27/2023) 20 tablet 0   oseltamivir  (TAMIFLU ) 75 MG capsule Take 1 capsule (75 mg total) by mouth daily. (Patient not taking: Reported on 07/14/2024) 10 capsule 0   Pyridoxine HCl (VITAMIN B-6 PO) Take 1 tablet by mouth 2 (two) times daily.     sacubitril -valsartan  (ENTRESTO ) 49-51 MG Take 1 tablet by mouth 2 (two) times daily. 60 tablet 2   No current facility-administered medications for this visit.      LABORATORY DATA:  I have reviewed the data as listed    Latest Ref Rng & Units 06/02/2024    9:46 AM 03/02/2024   11:21 AM  11/27/2023   11:15 AM  CMP  Glucose 70 - 99 mg/dL 847  891  899   BUN 6 - 20 mg/dL 11  10  9    Creatinine 0.61 - 1.24 mg/dL 8.80  8.98  8.96   Sodium 135 - 145 mmol/L 139  139  138   Potassium 3.5 - 5.1 mmol/L 4.2  4.0  4.3   Chloride 98 - 111 mmol/L 104  107  107   CO2 22 - 32 mmol/L 26  23  25    Calcium  8.9 - 10.3 mg/dL 9.5  9.1  9.3   Total Protein 6.5 - 8.1 g/dL 7.1  7.0  7.2   Total Bilirubin 0.0 - 1.2 mg/dL 0.4  0.5  0.5   Alkaline Phos 38 - 126 U/L 73  68  61   AST 15 - 41 U/L 38  29  31   ALT 0 - 44 U/L 44  22  27     Lab Results  Component Value Date   WBC 5.8 06/02/2024   HGB 15.3 06/02/2024   HCT 44.2 06/02/2024   MCV 95.9 06/02/2024   PLT 221 06/02/2024   NEUTROABS 3.0 06/02/2024    ASSESSMENT & PLAN:  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 CT 08/27/2021 in ED showed 3.8 cm left breast mass with  left axillary lymph nodes.Mamm and US : 4 cm mass with 5 axillary lymph nodes: Biopsy: Grade 2 IDC, lymph node: Positive, ER 100%, PR 50%, Ki-67 25%, HER2 negative ratio 1.25, copy #3.45 (Recent history of coronary artery disease EF 22% and stroke)   Treatment summary: 1. Neoadjuvant chemotherapy with Taxotere  and Cytoxan  every 3 weeks x6 cycles completed 01/16/2022 2. left mastectomy: Residual grade 2 IDC 3.5 cm, margins negative, 2/2 lymph nodes positive, angiolymphatic invasion present, ER 100%, PR 50%, HER2 negative, Ki-67 25% 3. Followed by adjuvant radiation therapy 04/03/22-05/16/22 4.  Followed by adjuvant antiestrogen therapy with tamoxifen  started November 2023 5. Signatera test positive (0.3)  6. 06/26/2023: Bone scan: Negative, CT CAP: Enlarged left supraclavicular lymph node 2.1 cm, (used to be 0.8 cm) new tiny scattered nonspecific lung nodules (4 mm and 3 mm) 7. 07/16/2023: Biopsy left supraclavicular lymph node: Metastatic poorly differentiated carcinoma  consistent with breast ER 75%, PR 75%, Ki67 40%, HER2 1+ 8. 08/23/2023: Left neck dissection: 3/19 lymph nodes positive consistent with breast primary, extranodal extension on 3 lymph nodes, lymphovascular invasion identified 9. Tamoxifen  with Verzinio (100 p.o. twice daily) discontinued 07/15/2024 10.  06/08/2024: CT CAP: Pulmonary nodules 11.  Clavicular mass: Biopsy: Metastatic adenocarcinoma consistent with breast primary positive for ER/PR. 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  Send a prescription for Percocets because Ultram  is not working  Telephone appointment for chemo education with Norleen regarding Talazoparib Return to clinic in 2 weeks for labs and follow-up      No orders of the defined types were placed in this encounter.  The patient has a good understanding of the overall plan. he agrees with it. he will call with any problems that may develop before the next visit here.  I personally spent a total of 30 minutes in the care of the patient today including preparing to see the patient, getting/reviewing separately obtained history, performing a medically appropriate exam/evaluation, counseling and educating, placing orders, referring and communicating with other health care professionals, documenting clinical information in the EHR, independently interpreting results, communicating results, and coordinating care.   Viinay K Kailo Kosik, MD 07/15/2024    "

## 2024-07-16 ENCOUNTER — Ambulatory Visit (HOSPITAL_COMMUNITY)
Admission: RE | Admit: 2024-07-16 | Discharge: 2024-07-16 | Disposition: A | Discharge status: Home / Self Care | Attending: Surgery | Admitting: Surgery

## 2024-07-16 ENCOUNTER — Encounter (HOSPITAL_COMMUNITY)
Admission: RE | Disposition: A | Discharge status: Home / Self Care | Payer: Self-pay | Source: Home / Self Care | Attending: Surgery

## 2024-07-16 ENCOUNTER — Other Ambulatory Visit: Payer: Self-pay | Admitting: Pharmacist

## 2024-07-16 ENCOUNTER — Telehealth: Payer: Self-pay | Admitting: Pharmacist

## 2024-07-16 ENCOUNTER — Telehealth: Payer: Self-pay

## 2024-07-16 ENCOUNTER — Ambulatory Visit (HOSPITAL_COMMUNITY): Payer: Self-pay | Admitting: Vascular Surgery

## 2024-07-16 ENCOUNTER — Encounter: Payer: Self-pay | Admitting: Hematology and Oncology

## 2024-07-16 DIAGNOSIS — C50922 Malignant neoplasm of unspecified site of left male breast: Secondary | ICD-10-CM | POA: Diagnosis not present

## 2024-07-16 DIAGNOSIS — R221 Localized swelling, mass and lump, neck: Secondary | ICD-10-CM | POA: Insufficient documentation

## 2024-07-16 DIAGNOSIS — C792 Secondary malignant neoplasm of skin: Secondary | ICD-10-CM | POA: Diagnosis not present

## 2024-07-16 HISTORY — PX: LESION REMOVAL: SHX5196

## 2024-07-16 LAB — BASIC METABOLIC PANEL WITH GFR
Anion gap: 13 (ref 5–15)
BUN: 11 mg/dL (ref 6–20)
CO2: 19 mmol/L — ABNORMAL LOW (ref 22–32)
Calcium: 9.1 mg/dL (ref 8.9–10.3)
Chloride: 102 mmol/L (ref 98–111)
Creatinine, Ser: 0.91 mg/dL (ref 0.61–1.24)
GFR, Estimated: >60 mL/min
Glucose, Bld: 111 mg/dL — ABNORMAL HIGH (ref 70–99)
Potassium: 4.6 mmol/L (ref 3.5–5.1)
Sodium: 134 mmol/L — ABNORMAL LOW (ref 135–145)

## 2024-07-16 LAB — CBC
HCT: 45.4 % (ref 39.0–52.0)
Hemoglobin: 15.6 g/dL (ref 13.0–17.0)
MCH: 33.5 pg (ref 26.0–34.0)
MCHC: 34.4 g/dL (ref 30.0–36.0)
MCV: 97.6 fL (ref 80.0–100.0)
Platelets: 223 K/uL (ref 150–400)
RBC: 4.65 MIL/uL (ref 4.22–5.81)
RDW: 12.6 % (ref 11.5–15.5)
WBC: 8.1 K/uL (ref 4.0–10.5)
nRBC: 0 % (ref 0.0–0.2)

## 2024-07-16 MED ORDER — PROPOFOL 10 MG/ML IV BOLUS
INTRAVENOUS | Status: AC
  Filled 2024-07-16: qty 20

## 2024-07-16 MED ORDER — OXYCODONE HCL 5 MG/5ML PO SOLN: 5.0000 mg | Freq: Once | ORAL | Status: DC | PRN

## 2024-07-16 MED ORDER — ONDANSETRON HCL 4 MG/2ML IJ SOLN
INTRAMUSCULAR | Status: AC
  Filled 2024-07-16: qty 2

## 2024-07-16 MED ORDER — MIDAZOLAM HCL (PF) 2 MG/2ML IJ SOLN
INTRAMUSCULAR | Status: DC | PRN
  Administered 2024-07-16: 2 mg via INTRAVENOUS

## 2024-07-16 MED ORDER — FENTANYL CITRATE (PF) 250 MCG/5ML IJ SOLN
INTRAMUSCULAR | Status: DC | PRN
  Administered 2024-07-16 (×2): 25 ug via INTRAVENOUS
  Administered 2024-07-16: 50 ug via INTRAVENOUS

## 2024-07-16 MED ORDER — CHLORHEXIDINE GLUCONATE 0.12 % MT SOLN
OROMUCOSAL | Status: AC
  Administered 2024-07-16: 15 mL via OROMUCOSAL
  Filled 2024-07-16: qty 15

## 2024-07-16 MED ORDER — CHLORHEXIDINE GLUCONATE 0.12 % MT SOLN: 15.0000 mL | Freq: Once | OROMUCOSAL | Status: AC

## 2024-07-16 MED ORDER — LACTATED RINGERS IV SOLN: INTRAVENOUS | Status: DC

## 2024-07-16 MED ORDER — 0.9 % SODIUM CHLORIDE (POUR BTL) OPTIME
TOPICAL | Status: DC | PRN
  Administered 2024-07-16: 1000 mL

## 2024-07-16 MED ORDER — ONDANSETRON HCL 4 MG/2ML IJ SOLN
INTRAMUSCULAR | Status: DC | PRN
  Administered 2024-07-16: 4 mg via INTRAVENOUS

## 2024-07-16 MED ORDER — BUPIVACAINE HCL (PF) 0.25 % IJ SOLN
INTRAMUSCULAR | Status: DC | PRN
  Administered 2024-07-16: 14 mL

## 2024-07-16 MED ORDER — CEFAZOLIN SODIUM-DEXTROSE 3-4 GM/150ML-% IV SOLN
3.0000 g | INTRAVENOUS | Status: AC
  Administered 2024-07-16: 3 g via INTRAVENOUS

## 2024-07-16 MED ORDER — OXYCODONE HCL 5 MG PO TABS: 5.0000 mg | ORAL_TABLET | Freq: Four times a day (QID) | ORAL | 0 refills | Status: AC | PRN

## 2024-07-16 MED ORDER — DEXAMETHASONE SOD PHOSPHATE PF 10 MG/ML IJ SOLN
INTRAMUSCULAR | Status: DC | PRN
  Administered 2024-07-16: 10 mg via INTRAVENOUS

## 2024-07-16 MED ORDER — ACETAMINOPHEN 10 MG/ML IV SOLN: 1000.0000 mg | Freq: Once | INTRAVENOUS | Status: DC | PRN

## 2024-07-16 MED ORDER — FENTANYL CITRATE (PF) 100 MCG/2ML IJ SOLN: 25.0000 ug | INTRAMUSCULAR | Status: DC | PRN

## 2024-07-16 MED ORDER — ORAL CARE MOUTH RINSE: 15.0000 mL | Freq: Once | OROMUCOSAL | Status: AC

## 2024-07-16 MED ORDER — CHLORHEXIDINE GLUCONATE CLOTH 2 % EX PADS
6.0000 | MEDICATED_PAD | Freq: Once | CUTANEOUS | Status: AC
  Administered 2024-07-16: 6 via TOPICAL

## 2024-07-16 MED ORDER — OXYCODONE HCL 5 MG PO TABS: 5.0000 mg | ORAL_TABLET | Freq: Once | ORAL | Status: DC | PRN

## 2024-07-16 MED ORDER — PROPOFOL 10 MG/ML IV BOLUS
INTRAVENOUS | Status: DC | PRN
  Administered 2024-07-16 (×3): 50 mg via INTRAVENOUS

## 2024-07-16 MED ORDER — MIDAZOLAM HCL 2 MG/2ML IJ SOLN
INTRAMUSCULAR | Status: AC
  Filled 2024-07-16: qty 2

## 2024-07-16 MED ORDER — PHENYLEPHRINE 80 MCG/ML (10ML) SYRINGE FOR IV PUSH (FOR BLOOD PRESSURE SUPPORT)
PREFILLED_SYRINGE | INTRAVENOUS | Status: DC | PRN
  Administered 2024-07-16: 80 ug via INTRAVENOUS

## 2024-07-16 MED ORDER — CHLORHEXIDINE GLUCONATE CLOTH 2 % EX PADS: 6.0000 | MEDICATED_PAD | Freq: Once | CUTANEOUS | Status: DC

## 2024-07-16 MED ORDER — CEFAZOLIN SODIUM-DEXTROSE 2-4 GM/100ML-% IV SOLN
INTRAVENOUS | Status: AC
  Filled 2024-07-16: qty 100

## 2024-07-16 MED ORDER — FENTANYL CITRATE (PF) 100 MCG/2ML IJ SOLN
INTRAMUSCULAR | Status: AC
  Filled 2024-07-16: qty 2

## 2024-07-16 MED ORDER — BUPIVACAINE HCL (PF) 0.25 % IJ SOLN
INTRAMUSCULAR | Status: AC
  Filled 2024-07-16: qty 30

## 2024-07-16 MED ORDER — ONDANSETRON HCL 4 MG/2ML IJ SOLN: 4.0000 mg | Freq: Once | INTRAMUSCULAR | Status: DC | PRN

## 2024-07-16 MED ORDER — LIDOCAINE 2% (20 MG/ML) 5 ML SYRINGE
INTRAMUSCULAR | Status: DC | PRN
  Administered 2024-07-16: 100 mg via INTRAVENOUS

## 2024-07-16 NOTE — Op Note (Signed)
 Metastatic breast cancer metastatic to skin superficial clavicle region 5 cm subcutaneous  Postoperative diagnosis: Same new  Procedure: Wide excision of 5 cm anesthetic to positive breast cancer (total area excision was 6 cm x 6 cm )  Surgeon: Debby Shipper, MD  Anesthesia: General By LMA with 0.25% Marcaine  with epinephrine   EBL: Minimal  Specimen skin lesion to pathology  Indications for procedure: The patient is a 44 year old male with history of breast cancer.  He presented with a new nodule just above his left clavicle.  Of note he had a root previous left neck dissection for metastatic breast cancer 2 years ago.  Core biopsy showed recurrence of his poorly differentiated breast cancer.  He presents for wide excision for local control.  He has no other evidence of metastatic disease this point in time.The procedure has been discussed with the patient.  Alternative therapies have been discussed with the patient.  Operative risks include bleeding,  Infection,  Organ injury,  Nerve injury,  Blood vessel injury,  DVT,  Pulmonary embolism,  Death,  And possible reoperation.  Medical management risks include worsening of present situation.  The success of the procedure is 50 -90 % at treating patients symptoms.  The patient understands and agrees to proceed.   Description of procedure: The patient was met in the holding area questions were answered.  The left side was marked.  He was taken back to the operative room.  He is placed upon the OR table.  After induction of general esthesia both arms were tucked and the supraclavicular skin of the left clavicle was prepped and draped in sterile fashion and a timeout performed.  Elliptical incision was made over the lesion.  We dissected down to the platysma muscle.  This appeared to be anterior to the platysma did not violate the platysma.  I did take some of the small fibers with the area.  Gross margins were negative.  Local anesthetic was infiltrated  throughout the subcutaneous space.  The wound was closed combination of 0 Vicryl, 3-0 Vicryl for Monocryl.  Dermabond applied.  All counts found to be correct.  The patient was awoke extubated taken recovery in satisfactory condition.

## 2024-07-16 NOTE — Discharge Instructions (Signed)
 #######################################################  GENERAL SURGERY: POST OP INSTRUCTIONS  ######################################################################  EAT Gradually transition to a high fiber diet with a fiber supplement over the next few weeks after discharge.  Start with a pureed / full liquid diet (see below)  WALK Walk an hour a day.  Control your pain to do that.    CONTROL PAIN Control pain so that you can walk, sleep, tolerate sneezing/coughing, go up/down stairs.  HAVE A BOWEL MOVEMENT DAILY Keep your bowels regular to avoid problems.  OK to try a laxative to override constipation.  OK to use an antidairrheal to slow down diarrhea.  Call if not better after 2 tries  CALL IF YOU HAVE PROBLEMS/CONCERNS Call if you are still struggling despite following these instructions. Call if you have concerns not answered by these instructions  ######################################################################    DIET: Follow a light bland diet & liquids the first 24 hours after arrival home, such as soup, liquids, starches, etc.  Be sure to drink plenty of fluids.  Quickly advance to a usual solid diet within a few days.  Avoid fast food or heavy meals as your are more likely to get nauseated or have irregular bowels.  A low-fat, high-fiber diet for the rest of your life is ideal.    Take your usually prescribed home medications unless otherwise directed. Blood thinners:  You can restart any strong blood thinners after the second postoperative day  for example: COUMADIN (warfarin), XERELTO (rivaroxaban), ELIQUIS (apixaban), PLAVIX (clopidigrel), BRILINTA (ticagrelor), EFFIENT (prasugrel), PRADAXA (dabigatran), etc  Continue aspirin before & after surgery..     Some oozing/bleeding the first 1-2 weeks is common but should taper down & be small volume.    If you are passing many large clots or having uncontrolling bleeding, call your surgeon  PAIN CONTROL: Pain  is best controlled by a usual combination of three different methods TOGETHER: Ice/Heat Over the counter pain medication Prescription pain medication Most patients will experience some swelling and bruising around the incisions.  Ice packs or heating pads (30-60 minutes up to 6 times a day) will help. Use ice for the first few days to help decrease swelling and bruising, then switch to heat to help relax tight/sore spots and speed recovery.  Some people prefer to use ice alone, heat alone, alternating between ice & heat.  Experiment to what works for you.  Swelling and bruising can take several weeks to resolve.   It is helpful to take an over-the-counter pain medication regularly for the first few weeks.  Choose one of the following that works best for you: Naproxen (Aleve, etc)  Two 220mg  tabs twice a day Ibuprofen (Advil, etc) Three 200mg  tabs four times a day (every meal & bedtime) Acetaminophen  (Tylenol , etc) 500-650mg  four times a day (every meal & bedtime) A  prescription for pain medication (such as oxycodone , hydrocodone, etc) should be given to you upon discharge.  Take your pain medication as prescribed.  If you are having problems/concerns with the prescription medicine (does not control pain, nausea, vomiting, rash, itching, etc), please call us  [phone] to see if we need to switch you to a different pain medicine that will work better for you and/or control your side effect better. If you need a refill on your pain medication, please contact your pharmacy.  They will contact our office to request authorization. Prescriptions will not be filled after 5 pm or on week-ends.  Avoid getting constipated.  Between the surgery and the pain medications, it is  common to experience some constipation.  Increasing fluid intake and taking a fiber supplement (such as Metamucil, Citrucel, FiberCon, MiraLax, etc) 1-2 times a day regularly will usually help prevent this problem from occurring.  A mild  laxative (prune juice, Milk of Magnesia, MiraLax, etc) should be taken according to package directions if there are no bowel movements after 48 hours.   Watch out for diarrhea.  If you have many loose bowel movements, simplify your diet to bland foods & liquids for a few days.  Stop any stool softeners and decrease your fiber supplement.  Switching to mild anti-diarrheal medications (Loperamide/Imodium, Kayopectate, Pepto Bismol) can help.  If this worsens or does not improve, please call us .  Wash / shower every day.  You may shower over the dressings as they are waterproof.  Continue to shower over incision(s) after the dressing is off. Remove your waterproof bandages 5 days after surgery.  You may leave the incision open to air.  You may have skin tapes (Steri Strips) covering the incision(s).  Leave them on until one week, then remove.  You may replace a dressing/Band-Aid to cover the incision for comfort if you wish.   ACTIVITIES as tolerated:   You may resume regular (light) daily activities beginning the next day--such as daily self-care, walking, climbing stairs--gradually increasing activities as tolerated.  If you can walk 30 minutes without difficulty, it is safe to try more intense activity such as jogging, treadmill, bicycling, low-impact aerobics, swimming, etc. Save the most intensive and strenuous activity for last such as sit-ups, heavy lifting, contact sports, etc  Refrain from any heavy lifting or straining until you are off narcotics for pain control.   DO NOT PUSH THROUGH PAIN.  Let pain be your guide: If it hurts to do something, don't do it.  Pain is your body warning you to avoid that activity for another week until the pain goes down. You may drive when you are no longer taking prescription pain medication, you can comfortably wear a seatbelt, and you can safely maneuver your car and apply brakes. You may have sexual intercourse when it is comfortable.   FOLLOW UP in our  office Please call [facility] at [phone] to set up an appointment to see your surgeon in the office for a follow-up appointment approximately 2-3 weeks after your surgery. Make sure that you call for this appointment the day you arrive home to insure a convenient appointment time.  9. IF YOU HAVE DISABILITY OR FAMILY LEAVE FORMS, BRING THEM TO THE OFFICE FOR PROCESSING.  DO NOT GIVE THEM TO YOUR DOCTOR.   WHEN TO CALL US  [phone]: Poor pain control Reactions / problems with new medications (rash/itching, nausea, etc)  Fever over 101.5 F (38.5 C) Worsening swelling or bruising Continued bleeding from incision. Increased pain, redness, or drainage from the incision Difficulty breathing / swallowing   The clinic staff is available to answer your questions during regular business hours (8:30am-5pm).  Please don't hesitate to call and ask to speak to one of our nurses for clinical concerns.   If you have a medical emergency, go to the nearest emergency room or call 911.  A surgeon from [facility] is always on call at the [facility], [place] [address], [place], [place]  [address] ? MAIN: [phone] ? TOLL FREE: [phone] ?  FAX [phone] [email or web address]  #######################################################

## 2024-07-16 NOTE — Telephone Encounter (Signed)
 [facility]        Telephone: [phone]?Fax: [phone]   Oncology Clinical Pharmacist Practitioner Encounter   Received new prescription for talazoparib for the treatment of breast cancer. This is being given monotherapy. It is planned to continue until disease progression or unacceptable toxicity.  Labs will be done prior to starting per Dr. Odean. Prescription dose and frequency assessed.   Current medication list in Epic reviewed. Significant DDIs with talazoparib identified:No. Will review possible hyperkalemia with patient regarding Entresto  and eplerenone  .  Evaluated chart. Patient barriers to medication adherence identified: No.  Patient agreement for treatment documented in physician note on 07/15/24.  Dr. Odean would like telephone education done. Will schedule lab appt after discussing with patient and prior to starting.  Prescription has been e-scribed to the [facility]) for benefits analysis and approval.  [facility] Oncology Clinic will continue to follow for insurance authorization, copayment issues, initial counseling and start date.  Rogelio Waynick A. Lucila, PharmD, BCOP, CPP Hematology-Oncology Clinical Pharmacist Practitioner  07/16/2024 7:43 AM  **Disclaimer: This note was dictated with voice recognition software. Similar sounding words can inadvertently be transcribed and this note may contain transcription errors which may not have been corrected upon publication of note.**

## 2024-07-16 NOTE — Progress Notes (Unsigned)
 " [facility]        Telephone: [phone]?Fax: [phone]   Oncology Clinical Pharmacist Practitioner Initial Assessment   Michael Stuart is a 44 y.o. male with a diagnosis of breast cancer. They were contacted today via telephone visit.  I connected with Michael Stuart today by telephone and verified that I was speaking with the correct person using two patient identifiers. I discussed the limitations, risks, security and privacy concerns of performing an evaluation and management service by telemedicine and the availability of in-person appointments. The patient/caregiver expressed understanding and agreed to proceed.  Other persons participating in the visit and their role in the encounter: ***   Patients location: home  Providers location: clinic   Indication/Regimen Talazoparib (Talzenna ) is being used appropriately for treatment of breast cancer by Dr. Vinay Gudena.      Wt Readings from Last 1 Encounters:  03/02/24 (!) 309 lb 14.4 oz (140.6 kg)    Estimated body surface area is 2.75 meters squared as calculated from the following:   Height as of 07/14/24: 6' 3 (1.905 m).   Weight as of 07/14/24: 315 lb (142.9 kg).  The dosing regimen is 1 mg by mouth daily on days 1 to 28 of a 28-day cycle. This is being given  monotherapy. It is planned to continue until disease progression or unacceptable toxicity. Prescription dose and frequency assessed for appropriateness. The treatment goal is: Control.  Patient has agreed to treatment which is documented in physician note on 07/15/24. Counseled patient on administration, dosing, side effects, monitoring, drug-food interactions, safe handling, storage, and disposal.  ***  Dose Modifications None   Access Assessment DERYL GIROUX will be receiving talazoparib through {JAIPHARMACY:25275::[facility]} Insurance Concerns: *** Start date if known: ***  Adherence Assessment Reviewed importance on  keeping a med schedule and plan for any missed doses Barriers to adherence identified? {JAI barriers to adherence:31627::No}  Communication and Learning Assessment Primary learner: *** Barriers to learning: {Barriers to learning:33317} Preferred language: {Preferred Language:33318} Learning preferences: {Learning preference:33319}   Allergies Allergies[1]  Vitals    07/14/2024    3:49 PM 03/02/2024   11:36 AM 11/27/2023   11:53 AM  Oncology Vitals  Height 191 cm  191 cm  Weight 142.883 kg 140.57 kg   Weight (lbs) 315 lbs 309 lbs 14 oz   BMI 39.37 kg/m2 38.73 kg/m2 39.07 kg/m2  Temp  98.5 F (36.9 C) 98 F (36.7 C)  Pulse Rate  110 84  BP  142/92 148/88  Resp  18 18  SpO2  96 % 97 %  BSA (m2) 2.75 m2 2.73 m2 2.74 m2     Laboratory Data    Latest Ref Rng & Units 06/02/2024    9:46 AM 03/02/2024   11:21 AM 11/27/2023   11:15 AM  CBC EXTENDED  WBC 4.0 - 10.5 K/uL 5.8  6.3  6.4   RBC 4.22 - 5.81 MIL/uL 4.61  4.66  4.92   Hemoglobin 13.0 - 17.0 g/dL 84.6  84.7  84.1   HCT 39.0 - 52.0 % 44.2  43.3  45.3   Platelets 150 - 400 K/uL 221  240  215   NEUT# 1.7 - 7.7 K/uL 3.0  2.8  3.2   Lymph# 0.7 - 4.0 K/uL 2.1  2.7  2.2        Latest Ref Rng & Units 06/02/2024    9:46 AM 03/02/2024   11:21 AM 11/27/2023  11:15 AM  CMP  Glucose 70 - 99 mg/dL 847  891  899   BUN 6 - 20 mg/dL 11  10  9    Creatinine 0.61 - 1.24 mg/dL 8.80  8.98  8.96   Sodium 135 - 145 mmol/L 139  139  138   Potassium 3.5 - 5.1 mmol/L 4.2  4.0  4.3   Chloride 98 - 111 mmol/L 104  107  107   CO2 22 - 32 mmol/L 26  23  25    Calcium  8.9 - 10.3 mg/dL 9.5  9.1  9.3   Total Protein 6.5 - 8.1 g/dL 7.1  7.0  7.2   Total Bilirubin 0.0 - 1.2 mg/dL 0.4  0.5  0.5   Alkaline Phos 38 - 126 U/L 73  68  61   AST 15 - 41 U/L 38  29  31   ALT 0 - 44 U/L 44  22  27    Lab Results  Component Value Date   MG 1.9 08/27/2021   MG 1.8 08/27/2021   No results found for: [id number]   Contraindications Contraindications  were reviewed? {yes/no:20286} Contraindications to therapy were identified? {YES/NO:21197}  Safety Precautions The following safety precautions for the use of talazoparib were reviewed:  Fever: reviewed the importance of having a thermometer and the Centers for Disease Control and Prevention (CDC) definition of fever which is 100.38F (38C) or higher. Patient should call 24/7 triage at [phone] if experiencing a fever or any other symptoms Decreased white blood cells (WBCs) and increased risk for infection Fatigue Decreased platelet count and increased risk for bleeding Decreased hemoglobin, part of the red blood cells that carry iron and oxygen Nausea or vomiting Changes in liver function Headache Changes in electrolytes and other laboratory values (hyperglycemia, hypocalcemia) Secondary malignancies Take with or without food Storage and Handling Handling body fluids and waste Pregnancy, sexual activity, and contraception ***  Medication Reconciliation Current Outpatient Medications  Medication Sig Dispense Refill   atorvastatin  (LIPITOR) 20 MG tablet TAKE 1 TABLET BY MOUTH EVERY DAY 15 tablet 0   azithromycin  (ZITHROMAX ) 250 MG tablet Take as directed (Patient not taking: Reported on 07/14/2024) 6 each 0   benzonatate  (TESSALON ) 200 MG capsule Take 1 capsule (200 mg total) by mouth 3 (three) times daily as needed for cough. 20 capsule 0   dapagliflozin  propanediol (FARXIGA ) 10 MG TABS tablet Take 1 tablet (10 mg total) by mouth daily before breakfast. 90 tablet 3   diphenhydramine -acetaminophen  (TYLENOL  PM) 25-500 MG TABS tablet Take 3 tablets by mouth at bedtime.     doxycycline  (VIBRA -TABS) 100 MG tablet Take 1 tablet (100 mg total) by mouth 2 (two) times daily. (Patient not taking: Reported on 07/14/2024) 14 tablet 0   eplerenone  (INSPRA ) 50 MG tablet Take 1 tablet by mouth daily. 90 tablet 3   metoprolol  succinate (TOPROL -XL) 50 MG 24 hr tablet Take 1.5 tablets (75 mg total) by  mouth daily. 135 tablet 3   ondansetron  (ZOFRAN -ODT) 4 MG disintegrating tablet Take 1 tablet (4 mg total) by mouth every 8 (eight) hours as needed for nausea or vomiting. (Patient not taking: Reported on 11/27/2023) 20 tablet 0   oseltamivir  (TAMIFLU ) 75 MG capsule Take 1 capsule (75 mg total) by mouth daily. (Patient not taking: Reported on 07/14/2024) 10 capsule 0   oxyCODONE -acetaminophen  (PERCOCET/ROXICET) 5-325 MG tablet Take 1 tablet by mouth every 8 (eight) hours as needed for severe pain (pain score 7-10). 30 tablet 0   Pyridoxine  HCl (VITAMIN B-6 PO) Take 1 tablet by mouth 2 (two) times daily.     sacubitril -valsartan  (ENTRESTO ) 49-51 MG Take 1 tablet by mouth 2 (two) times daily. 60 tablet 2   talazoparib tosylate  (TALZENNA ) 1 MG capsule Take 1 capsule (1 mg total) by mouth daily. 30 capsule 3   No current facility-administered medications for this visit.    Medication reconciliation is based on the patient's most recent medication list in the electronic medical record (EMR) including herbal products and OTC medications.   The patient's medication list was reviewed today with the patient? {YES/NO:21197}  Drug-drug interactions (DDIs) DDIs were evaluated? {yes/no:20286} Significant DDIs identified? {YES/NO:21197}  Drug-Food Interactions Drug-food interactions were evaluated? {yes/no:20286} Drug-food interactions identified? {YES/NO:21197}  Follow-up Plan  Patient education handout given to patient Start talazoparib *** mg by mouth daily Monitor for side effects Distress thermometer completed during {ksvisittype:33122} and reviewed with patient. Due to score, social work referral has {social work yes/no:33124} *** JACY HOWAT can follow up with clinical pharmacy as deemed necessary by Dr. KATHERNE going forward   Michael Stuart participated in the discussion, expressed understanding, and voiced agreement with the above plan. All questions were answered to her  satisfaction. The patient was advised to contact the clinic at [phone] with any questions or concerns prior to her return visit.   I spent *** minutes assessing the patient.  Safiya Girdler A. Lucila, PharmD, BCOP, CPP  Norleen DELENA Lucila, RPH-CPP, 07/16/2024 8:39 AM  **Disclaimer: This note was dictated with voice recognition software. Similar sounding words can inadvertently be transcribed and this note may contain transcription errors which may not have been corrected upon publication of note.**     [1]  Allergies Allergen Reactions   Bactrim [Sulfamethoxazole-Trimethoprim] Other (See Comments)    Blood capillaries rupture    Ovace Plus Wash [Sulfacetamide Sodium] Other (See Comments)    Blood capillaries rupture    "

## 2024-07-16 NOTE — Interval H&P Note (Signed)
 History and Physical Interval Note:  07/16/2024 1:24 PM  Michael Stuart  has presented today for surgery, with the diagnosis of skin lesion.  The various methods of treatment have been discussed with the patient and family. After consideration of risks, benefits and other options for treatment, the patient has consented to  Procedures with comments: EXCISION, LESION, TORSO (Left) - excision left chest skin lesion as a surgical intervention.  The patient's history has been reviewed, patient examined, no change in status, stable for surgery.  I have reviewed the patient's chart and labs.  Questions were answered to the patient's satisfaction.     Deriana Vanderhoef A Teran Daughenbaugh

## 2024-07-16 NOTE — Anesthesia Procedure Notes (Signed)
 Procedure Name: LMA Insertion Date/Time: 07/16/2024 2:02 PM  Performed by: Elly Pfeiffer, CRNAPre-anesthesia Checklist: Emergency Drugs available, Patient identified, Suction available, Patient being monitored and Timeout performed Patient Re-evaluated:Patient Re-evaluated prior to induction Oxygen Delivery Method: Circle system utilized Preoxygenation: Pre-oxygenation with 100% oxygen Induction Type: IV induction Ventilation: Oral airway inserted - appropriate to patient size and Mask ventilation without difficulty LMA: LMA inserted LMA Size: 5.0 Number of attempts: 1 Comments: No trauma; placed x1 attempt without difficulty. CA

## 2024-07-16 NOTE — Progress Notes (Signed)
 Dr. Keneth aware of patient's elevated b/p

## 2024-07-16 NOTE — Anesthesia Postprocedure Evaluation (Signed)
"   Anesthesia Post Note  Patient: Michael Stuart  Procedure(s) Performed: EXCISION, LESION, TORSO (Left)     Patient location during evaluation: PACU Anesthesia Type: General Level of consciousness: awake and alert Pain management: pain level controlled Vital Signs Assessment: post-procedure vital signs reviewed and stable Respiratory status: spontaneous breathing, nonlabored ventilation, respiratory function stable and patient connected to nasal cannula oxygen Cardiovascular status: blood pressure returned to baseline and stable Postop Assessment: no apparent nausea or vomiting Anesthetic complications: no   No notable events documented.  Last Vitals:  Vitals:   07/16/24 1515 07/16/24 1530  BP: (!) 162/95 (!) 149/98  Pulse: 95 93  Resp: 17 14  Temp:  37 C  SpO2: 95% 96%    Last Pain:  Vitals:   07/16/24 1515  PainSc: 0-No pain                 [name]      "

## 2024-07-16 NOTE — Transfer of Care (Signed)
 Immediate Anesthesia Transfer of Care Note  Patient: Michael Stuart  Procedure(s) Performed: EXCISION, LESION, TORSO (Left)  Patient Location: PACU  Anesthesia Type:General  Level of Consciousness: drowsy and patient cooperative  Airway & Oxygen Therapy: Patient Spontanous Breathing and Patient connected to face mask oxygen  Post-op Assessment: Report given to RN and Post -op Vital signs reviewed and stable  Post vital signs: stable  Last Vitals:  Vitals Value Taken Time  BP 157/92 07/16/24 14:50  Temp 37 C 07/16/24 14:50  Pulse 99 07/16/24 14:53  Resp 13 07/16/24 14:53  SpO2 94 % 07/16/24 14:53  Vitals shown include unfiled device data.  Last Pain:  Vitals:   07/16/24 1142  PainSc: 0-No pain         Complications: No notable events documented.

## 2024-07-16 NOTE — Telephone Encounter (Signed)
 Pt reached out w/ concern regarding his chemo being denied. RN forwarded message to pharmacist Norleen Parkinson. RN attempted to contact pt x1 but VM was full.

## 2024-07-16 NOTE — H&P (Signed)
 History of Present Illness: Michael Stuart is a 44 y.o. male who is seen today for follow-up. He has a history of left breast cancer status post neoadjuvant chemotherapy, mastectomy and radiation therapy. This is in 2023. In January 2025 he developed left supraclavicular adenopathy and left neck adenopathy requiring a left neck dissection. He had 3 of 19 nodes positive for metastatic cancer. He has developed a skin lesion over his left clavicle. He was sent by oncology for a punch biopsy today..    Review of Systems: A complete review of systems was obtained from the patient. I have reviewed this information and discussed as appropriate with the patient. See HPI as well for other ROS.    Medical History: Past Medical History:  Diagnosis Date  CHF (congestive heart failure) (CMS/HHS-HCC)  DVT (deep venous thrombosis) (CMS/HHS-HCC)  History of cancer  History of myocardial infarction   Patient Active Problem List  Diagnosis  Breast cancer, stage 4, left (CMS/HHS-HCC)   Past Surgical History:  Procedure Laterality Date  INCISION & DRAINAGE PILONIDAL CYST 02/1998  MASTECTOMY    Allergies  Allergen Reactions  Bactrim [Sulfamethoxazole-Trimethoprim] Unknown   Current Outpatient Medications on File Prior to Visit  Medication Sig Dispense Refill  VERZENIO  150 mg tablet  apixaban  (ELIQUIS ) 5 mg tablet Take 1 tablet by mouth 2 (two) times daily  atorvastatin  (LIPITOR) 20 MG tablet Take 20 mg by mouth once daily  dapagliflozin  propanediol (FARXIGA ) 10 mg tablet Take 1 tablet by mouth once daily  dexAMETHasone  (DECADRON ) 4 MG tablet Take by mouth  digoxin  (LANOXIN ) 0.125 MG tablet Take 1 tablet by mouth once daily  ENTRESTO  49-51 mg tablet Take 1 tablet by mouth 2 (two) times daily  eplerenone  (INSPRA ) 25 MG tablet Take 2 tablets by mouth once daily  ivabradine  (CORLANOR ) 5 mg tablet Take by mouth  metoprolol  succinate (TOPROL -XL) 25 MG XL tablet Take 1 tablet by mouth once daily   ondansetron  (ZOFRAN ) 8 MG tablet Take by mouth  oxyCODONE  (ROXICODONE ) 5 MG immediate release tablet Take 1 tablet (5 mg total) by mouth every 6 (six) hours as needed for Pain 20 tablet 0  prochlorperazine  (COMPAZINE ) 10 MG tablet Take by mouth   No current facility-administered medications on file prior to visit.   Family History  Problem Relation Age of Onset  High blood pressure (Hypertension) Father  Coronary Artery Disease (Blocked arteries around heart) Father    Social History   Tobacco Use  Smoking Status Every Day  Types: Cigarettes  Smokeless Tobacco Not on file    Social History   Socioeconomic History  Marital status: Married  Tobacco Use  Smoking status: Every Day  Types: Cigarettes  Vaping Use  Vaping status: Never Used  Substance and Sexual Activity  Alcohol  use: Yes  Drug use: Never   Social Drivers of Corporate Investment Banker Strain: Low Risk (08/29/2021)  Received from [facility]  Overall Financial Resource Strain (CARDIA)  Difficulty of Paying Living Expenses: Not very hard  Food Insecurity: No Food Insecurity (08/23/2023)  Received from [facility]  Hunger Vital Sign  Within the past 12 months, you worried that your food would run out before you got the money to buy more.: Never true  Within the past 12 months, the food you bought just didn't last and you didn't have money to get more.: Never true  Transportation Needs: No Transportation Needs (08/23/2023)  Received from [facility]  Lack of Transportation (Medical): No  Lack of Transportation (Non-Medical): No  Received from [facility]  Social Network  Housing Stability: Unknown (06/22/2024)  Housing Stability Vital Sign  Homeless in the Last Year: No   Objective:   Vitals:  06/22/24 0856  PainSc: 0-No pain   There is no height or weight on file to calculate BMI.  Physical Exam Exam conducted with a chaperone present.  Pulmonary:  Effort: Pulmonary  effort is normal.  Chest:   Comments: 3 cm skin lesion left supraclavicular region  Left scar noted, port site scar on the right noted. Mild left arm lymphedema. Skin: General: Skin is warm.  Neurological:  General: No focal deficit present.  Mental Status: He is alert.  Psychiatric:  Mood and Affect: Mood normal.     Labs, Imaging and Diagnostic Testing:  CT CHEST, ABDOMEN AND PELVIS WITH CONTRAST 06/02/2024 01:24:40 PM  TECHNIQUE: CT of the chest, abdomen and pelvis was performed with the administration of intravenous contrast. Multiplanar reformatted images are provided for review. Automated exposure control, iterative reconstruction, and/or weight based adjustment of the mA/kV was utilized to reduce the radiation dose to as low as reasonably achievable.  COMPARISON: 07/02/2024.  CLINICAL HISTORY: Breast cancer, invasive, stage IV, assess treatment response; Met breast cancer restaging. * Tracking Code: BO * FINDINGS:  CHEST: MEDIASTINUM AND LYMPH NODES: Borderline cardiomegaly. No pericardial effusion. The central airways are clear. No mediastinal, hilar or axillary lymphadenopathy. No supraclavicular adenopathy. Left axillary node dissection. No subpectoral adenopathy.  LUNGS AND PLEURA: Anterior left upper lobe subpleural fibrosis is likely radiation induced. New and enlarging pulmonary nodules. Right middle lobe 5 mm nodule on image 108/6 measured 1-2 mm on the prior. Just cephalad and anterior to this, right middle lobe 4 mm nodule on image 106/6 is new. 2-3 mm left lower lobe pulmonary nodule on image 131/6 is new. A right lower lobe 5 mm nodule is new on image 100/6. No pneumothorax or pleural fluid. No central pulmonary embolism on this non-dedicated exam.  ABDOMEN AND PELVIS: LIVER: Marked hepatic steatosis. Hepatomegaly at 20.0 cm craniocaudal. No intrahepatic biliary duct dilatation.  GALLBLADDER AND BILE DUCTS: Gallbladder is unremarkable. No  biliary ductal dilatation.  SPLEEN: Normal in size and morphology.  PANCREAS: Normal, without duct dilatation or acute inflammation.  ADRENAL GLANDS: No acute abnormality.  KIDNEYS, URETERS AND BLADDER: No renal mass or hydronephrosis. Normal urinary bladder.  GI AND BOWEL: Normal stomach, without wall thickening. Normal small bowel caliber. Normal colon and terminal ileum. There is no bowel obstruction.  REPRODUCTIVE ORGANS: Normal prostate.  PERITONEUM AND RETROPERITONEUM: No ascites. No free air. No omental/peritoneal metastasis.  VASCULATURE: Aorta is normal in caliber. Thoracic aortic atherosclerosis. Abdominal aortic atherosclerosis.  ABDOMINAL AND PELVIS LYMPH NODES: No lymphadenopathy.  BONES AND SOFT TISSUES: Left mastectomy. Remote posterior left 7th rib fracture. No acute osseous abnormality. No focal soft tissue abnormality.  IMPRESSION: 1. New and enlarged tiny pulmonary nodules, highly suspicious for metastatic disease. 2. Left mastectomy and node dissection, with otherwise no evidence of metastatic disease. 3. Hepatic steatosis and hepatomegaly. 4. Aortic atherosclerosis (icd10-i70.0). This is age advanced.  Electronically signed by: Rockey Kilts MD 06/08/2024 09:34 PM EST RP Workstation: [id number] Procedure Note  Kilts Rockey, MD - 06/08/2024 Formatting of this note might be different from the original. EXAM: CT CHEST, ABDOMEN AND PELVIS WITH CONTRAST 06/02/2024 01:24:40 PM  TECHNIQUE: CT of the chest, abdomen and pelvis was performed with the administration of intravenous contrast. Multiplanar reformatted images are provided for review. Automated exposure control,  iterative reconstruction, and/or weight based adjustment of the mA/kV was utilized to reduce the radiation dose to as low as reasonably achievable.  COMPARISON: 07/02/2024.  CLINICAL HISTORY: Breast cancer, invasive, stage IV, assess treatment response; Met breast  cancer restaging. * Tracking Code: BO * FINDINGS:  CHEST: MEDIASTINUM AND LYMPH NODES: Borderline cardiomegaly. No pericardial effusion. The central airways are clear. No mediastinal, hilar or axillary lymphadenopathy. No supraclavicular adenopathy. Left axillary node dissection. No subpectoral adenopathy.  LUNGS AND PLEURA: Anterior left upper lobe subpleural fibrosis is likely radiation induced. New and enlarging pulmonary nodules. Right middle lobe 5 mm nodule on image 108/6 measured 1-2 mm on the prior. Just cephalad and anterior to this, right middle lobe 4 mm nodule on image 106/6 is new. 2-3 mm left lower lobe pulmonary nodule on image 131/6 is new. A right lower lobe 5 mm nodule is new on image 100/6. No pneumothorax or pleural fluid. No central pulmonary embolism on this non-dedicated exam.  ABDOMEN AND PELVIS: LIVER: Marked hepatic steatosis. Hepatomegaly at 20.0 cm craniocaudal. No intrahepatic biliary duct dilatation.  GALLBLADDER AND BILE DUCTS: Gallbladder is unremarkable. No biliary ductal dilatation.  SPLEEN: Normal in size and morphology.  PANCREAS: Normal, without duct dilatation or acute inflammation.  ADRENAL GLANDS: No acute abnormality.  KIDNEYS, URETERS AND BLADDER: No renal mass or hydronephrosis. Normal urinary bladder.  GI AND BOWEL: Normal stomach, without wall thickening. Normal small bowel caliber. Normal colon and terminal ileum. There is no bowel obstruction.  REPRODUCTIVE ORGANS: Normal prostate.  PERITONEUM AND RETROPERITONEUM: No ascites. No free air. No omental/peritoneal metastasis.  VASCULATURE: Aorta is normal in caliber. Thoracic aortic atherosclerosis. Abdominal aortic atherosclerosis.  ABDOMINAL AND PELVIS LYMPH NODES: No lymphadenopathy.  BONES AND SOFT TISSUES: Left mastectomy. Remote posterior left 7th rib fracture. No acute osseous abnormality. No focal soft tissue abnormality.  IMPRESSION: 1. New and enlarged  tiny pulmonary nodules, highly suspicious for metastatic disease. 2. Left mastectomy and node dissection, with otherwise no evidence of metastatic disease. 3. Hepatic steatosis and hepatomegaly. 4. Aortic atherosclerosis (icd10-i70.0). This is age advanced.  Electronically signed by: Rockey Kilts MD 06/08/2024 09:34 PM EST R  Assessment and Plan:   Diagnoses and all orders for this visit:  Breast cancer, stage 4, left (CMS/HHS-HCC)    Punch biopsy performed under sterile conditions today utilizing a 2 mm punch and 1% lidocaine  under sterile conditions. No immediate complications. Follow-up dictated by oncology is next at. He may need excision of this area as well at some point but we will discuss with oncology further once results are back.  No follow-ups on file.  DEBBY CURTISTINE SHIPPER, MD

## 2024-07-16 NOTE — Telephone Encounter (Signed)
 Oral Oncology Patient Advocate Encounter  Received notification that the request for prior authorization for Talzenna  has been denied due to .      Charlott Hamilton,  CPhT-Adv  she/her/hers [facility]  [facility] Specialty Pharmacy Services Pharmacy Technician Patient Advocate Specialist III [facility] Phone: [phone]  Fax: [phone] [email or web address]

## 2024-07-17 ENCOUNTER — Encounter: Payer: Self-pay | Admitting: Hematology and Oncology

## 2024-07-17 ENCOUNTER — Encounter (HOSPITAL_COMMUNITY): Payer: Self-pay | Admitting: Surgery

## 2024-07-20 ENCOUNTER — Inpatient Hospital Stay: Admitting: Pharmacist

## 2024-07-20 ENCOUNTER — Other Ambulatory Visit: Payer: Self-pay | Admitting: Hematology and Oncology

## 2024-07-20 ENCOUNTER — Other Ambulatory Visit (HOSPITAL_COMMUNITY): Payer: Self-pay

## 2024-07-20 ENCOUNTER — Telehealth: Payer: Self-pay | Admitting: Pharmacist

## 2024-07-20 MED ORDER — OLAPARIB 150 MG PO TABS: 300.0000 mg | ORAL_TABLET | Freq: Two times a day (BID) | ORAL | 3 refills | Status: AC

## 2024-07-20 NOTE — Telephone Encounter (Addendum)
 Oral Oncology Patient Advocate Encounter   An urgent appeal for the prior authorization denial of Talzenna  has been started by the pharmacist on 07/16/2024.  CASE # [id number] Appeal has been denied due to : Patient Does NOT have Germline BRCA Mutation positive disease 07/17/2024     Charlott Hamilton,  CPhT-Adv  she/her/hers [facility]  [facility] Specialty Pharmacy Services Pharmacy Technician Patient Advocate Specialist III [facility] Phone: [phone]  Fax: [phone] [email or web address]

## 2024-07-20 NOTE — Progress Notes (Signed)
 Somatic BRCA2 mutation: Patient's insurance is not approving Talazoparib.  I discussed with the patient different options and decided to see if they would approve olaparib .  I also reached out to genetics to see if germline genetic testing will need to be repeated to verify the BRCA mutation status. Alternatively I discussed with the patient about using Enhertu

## 2024-07-20 NOTE — Telephone Encounter (Signed)
 I spoke with patient and he is aware of cancelled telephone visit on 07/20/2024 and that someone will reach out later today.

## 2024-07-21 ENCOUNTER — Ambulatory Visit: Payer: Self-pay | Admitting: Surgery

## 2024-07-21 ENCOUNTER — Telehealth: Payer: Self-pay | Admitting: Pharmacist

## 2024-07-21 ENCOUNTER — Encounter: Payer: Self-pay | Admitting: Hematology and Oncology

## 2024-07-21 ENCOUNTER — Telehealth: Payer: Self-pay

## 2024-07-21 ENCOUNTER — Other Ambulatory Visit: Payer: Self-pay

## 2024-07-21 ENCOUNTER — Other Ambulatory Visit (HOSPITAL_COMMUNITY): Payer: Self-pay

## 2024-07-21 LAB — SURGICAL PATHOLOGY

## 2024-07-21 NOTE — Telephone Encounter (Signed)
 Oral Oncology Patient Advocate Encounter   Received notification that prior authorization for Lynparza  is required.   PA submitted on 07/21/24 Key [id number] Status is pending      Charlott Hamilton,  CPhT-Adv  she/her/hers [facility]  [facility] Specialty Pharmacy Services Pharmacy Technician Patient Advocate Specialist III [facility] Phone: [phone]  Fax: [phone] [email or web address]

## 2024-07-21 NOTE — Telephone Encounter (Addendum)
 Oral Oncology Patient Advocate Encounter  Prior Authorization for Lynparza  has been approved.    PA# [id number] Effective dates: 06/21/2024 through 07/21/2025  Patient Must fill through [facility]     Charlott Hamilton,  CPhT-Adv  she/her/hers [facility]  [facility] Specialty Pharmacy Services Pharmacy Technician Patient Advocate Specialist III [facility] Phone: [phone]  Fax: [phone] [email or web address]

## 2024-07-21 NOTE — Progress Notes (Unsigned)
 " [facility]        Telephone: [phone]?Fax: [phone]   Oncology Clinical Pharmacist Practitioner Initial Assessment   Michael Stuart is a 44 y.o. male with a diagnosis of metastatic breast cancer. They were contacted today via in-person visit.  Indication/Regimen Olaparib  [name] is being used appropriately for treatment of breast cancer by Michael Stuart.      Wt Readings from Last 1 Encounters:  07/16/24 (!) 315 lb (142.9 kg)    Estimated body surface area is 2.75 meters squared as calculated from the following:   Height as of 07/16/24: 6' 3 (1.905 m).   Weight as of 07/16/24: 315 lb (142.9 kg).  The dosing regimen is 2 tablets (300 mg total) by mouth every 12 hours on days 1 to 28 of a 28-day cycle. This is being given  monotherapy. It is planned to continue until disease progression or unacceptable toxicity. Prescription dose and frequency assessed for appropriateness. The treatment goal is: Control.  Patient has agreed to treatment which is documented in physician note on 07/20/24. Counseled patient on administration, dosing, side effects, monitoring, drug-food interactions, safe handling, storage, and disposal.  Patient must fill at [facility]. Prescription sent there today.  Labs were done on 07/16/24 and will be repeated prior to next Michael Stuart visit on 07/29/24.  Dose Modifications None   Access Assessment Michael Stuart will be receiving olaparib  through [facility] Concerns: none Start date if known: TBD, may start once received per Michael Stuart  Adherence Assessment Reviewed importance on keeping a med schedule and plan for any missed doses Barriers to adherence identified? No  Communication and Learning Assessment Primary learner: patient Barriers to learning: No barriers Preferred language: English Learning preferences: Listening   Allergies Allergies[1]  Vitals    07/16/2024    3:30 PM 07/16/2024    3:15 PM  07/16/2024    3:00 PM  Oncology Vitals  Temp 98.6 F (37 C)    Pulse Rate 93 95 96  BP 149/98 162/95 171/109  Resp 14 17 15   SpO2 96 % 95 % 97 %     Laboratory Data    Latest Ref Rng & Units 07/16/2024   11:31 AM 06/02/2024    9:46 AM 03/02/2024   11:21 AM  CBC EXTENDED  WBC 4.0 - 10.5 K/uL 8.1  5.8  6.3   RBC 4.22 - 5.81 MIL/uL 4.65  4.61  4.66   Hemoglobin 13.0 - 17.0 g/dL 84.3  84.6  84.7   HCT 39.0 - 52.0 % 45.4  44.2  43.3   Platelets 150 - 400 K/uL 223  221  240   NEUT# 1.7 - 7.7 K/uL  3.0  2.8   Lymph# 0.7 - 4.0 K/uL  2.1  2.7        Latest Ref Rng & Units 07/16/2024   11:31 AM 06/02/2024    9:46 AM 03/02/2024   11:21 AM  CMP  Glucose 70 - 99 mg/dL 888  847  891   BUN 6 - 20 mg/dL 11  11  10    Creatinine 0.61 - 1.24 mg/dL 9.08  8.80  8.98   Sodium 135 - 145 mmol/L 134  139  139   Potassium 3.5 - 5.1 mmol/L 4.6  4.2  4.0   Chloride 98 - 111 mmol/L 102  104  107   CO2 22 - 32 mmol/L 19  26  23    Calcium  8.9 - 10.3  mg/dL 9.1  9.5  9.1   Total Protein 6.5 - 8.1 g/dL  7.1  7.0   Total Bilirubin 0.0 - 1.2 mg/dL  0.4  0.5   Alkaline Phos 38 - 126 U/L  73  68   AST 15 - 41 U/L  38  29   ALT 0 - 44 U/L  44  22    Lab Results  Component Value Date   MG 1.9 08/27/2021   MG 1.8 08/27/2021   No results found for: [id number]   Contraindications Contraindications were reviewed? Yes Contraindications to therapy were identified? No   Safety Precautions The following safety precautions for the use of olaparib  were reviewed:  Fever: reviewed the importance of having a thermometer and the Centers for Disease Control and Prevention (CDC) definition of fever which is 100.51F (38C) or higher. Patient should call 24/7 triage at [phone] if experiencing a fever or any other symptoms Decreased hemoglobin, part of the red blood cells that carry iron and oxygen Nausea or vomiting Fatigue Abdominal pain Diarrhea Muscle or joint pain or weakness Changes in kidney  function Respiratory tract infection MDS/AML Pneumonitis Headache Neutropenia Arthralgia VTE Take with or without food Missed doses Avoid grapefruit products and Seville oranges Storage and Handling Handling body fluids and waste Pregnancy, sexual activity, and contraception  Medication Reconciliation Current Outpatient Medications  Medication Sig Dispense Refill   acetaminophen  (TYLENOL ) 500 MG tablet Take 1,000 mg by mouth 2 (two) times daily as needed for headache or fever (pain).     atorvastatin  (LIPITOR) 20 MG tablet TAKE 1 TABLET BY MOUTH EVERY DAY 15 tablet 0   dapagliflozin  propanediol (FARXIGA ) 10 MG TABS tablet Take 1 tablet (10 mg total) by mouth daily before breakfast. 90 tablet 3   diphenhydramine -acetaminophen  (TYLENOL  PM) 25-500 MG TABS tablet Take 3 tablets by mouth at bedtime.     eplerenone  (INSPRA ) 50 MG tablet Take 1 tablet by mouth daily. 90 tablet 3   metoprolol  succinate (TOPROL -XL) 50 MG 24 hr tablet Take 1.5 tablets (75 mg total) by mouth daily. 135 tablet 3   olaparib  (LYNPARZA ) 150 MG tablet Take 2 tablets (300 mg total) by mouth 2 (two) times daily. Swallow whole. May take with food to decrease nausea and vomiting. 120 tablet 3   oxyCODONE  (OXY IR/ROXICODONE ) 5 MG immediate release tablet Take 1 tablet (5 mg total) by mouth every 6 (six) hours as needed for severe pain (pain score 7-10). 15 tablet 0   oxyCODONE -acetaminophen  (PERCOCET/ROXICET) 5-325 MG tablet Take 1 tablet by mouth every 8 (eight) hours as needed for severe pain (pain score 7-10). 30 tablet 0   sacubitril -valsartan  (ENTRESTO ) 49-51 MG Take 1 tablet by mouth 2 (two) times daily. 60 tablet 2   No current facility-administered medications for this visit.   Medication reconciliation is based on the patient's most recent medication list in the electronic medical record (EMR) including herbal products and OTC medications.   The patient's medication list was reviewed today with the patient? Yes    Drug-drug interactions (DDIs) DDIs were evaluated? Yes Significant DDIs identified? Reviewed potential interactions with: 1) eplerenone  - sacubitril  and valsartan , 2) oxycodone -acetaminophen  - oxycodone  IR, diphenhydramine  high dose.  Drug-Food Interactions Drug-food interactions were evaluated? Yes Drug-food interactions identified? Avoid grapefruit products and Seville oranges  Follow-up Plan  Patient education handout given to patient Start olaparib  2 tablets (300 mg total) by mouth every 12 hours. May start once received from [facility] Monitor for side effects Distress thermometer  not done as patient has had prior treatment at [facility].   *** Labs, Michael Stuart visit scheduled for 07/29/24 Michael Stuart can follow up with clinical pharmacy as deemed necessary by Dr. Mackey Stuart going forward   Michael Stuart participated in the discussion, expressed understanding, and voiced agreement with the above plan. All questions were answered to her satisfaction. The patient was advised to contact the clinic at [phone] with any questions or concerns prior to her return visit.   I spent 60 minutes assessing the patient.  Michael Stuart, PharmD, BCOP, CPP  Michael Stuart, RPH-CPP, 07/21/2024 11:01 AM  **Disclaimer: This note was dictated with voice recognition software. Similar sounding words can inadvertently be transcribed and this note may contain transcription errors which may not have been corrected upon publication of note.**     [1]  Allergies Allergen Reactions   Bactrim [Sulfamethoxazole-Trimethoprim] Other (See Comments)    Blood capillaries rupture    Ovace Plus Wash [Sulfacetamide Sodium] Other (See Comments)    Blood capillaries rupture    "

## 2024-07-21 NOTE — Telephone Encounter (Signed)
 [facility]        Telephone: [phone]?Fax: [phone]   Oncology Clinical Pharmacist Practitioner Encounter   Received new prescription for olaparib  for the treatment of breast cancer. This is being given monotherapy. It is planned to continue until disease progression or unacceptable toxicity.  Labs from 07/16/24 assessed. Prescription dose and frequency assessed.   Current medication list in Epic reviewed. Significant DDIs with olaparib  identified:No. Will review other potential DDIs at upcoming patient visit  Evaluated chart. Patient barriers to medication adherence identified: No.  Patient agreement for treatment documented in physician note on 07/20/24. Talazoparib initially sent but was denied. Olaparib  was approved.  Prescription has been e-scribed to the [facility]) for benefits analysis and approval.  Oral Oncology Clinic will continue to follow for insurance authorization, copayment issues, initial counseling and start date.  Dr. Odean sees next with labs prior on 07/29/24.  Tej Murdaugh A. Lucila, PharmD, BCOP, CPP Hematology-Oncology Clinical Pharmacist Practitioner  07/21/2024 10:16 AM  **Disclaimer: This note was dictated with voice recognition software. Similar sounding words can inadvertently be transcribed and this note may contain transcription errors which may not have been corrected upon publication of note.**

## 2024-07-22 ENCOUNTER — Inpatient Hospital Stay: Admitting: Pharmacist

## 2024-07-22 VITALS — BP 163/94 | HR 105 | Temp 98.1°F | Resp 18 | Wt 338.0 lb

## 2024-07-22 DIAGNOSIS — C50422 Malignant neoplasm of upper-outer quadrant of left male breast: Secondary | ICD-10-CM | POA: Diagnosis present

## 2024-07-22 DIAGNOSIS — C8591 Non-Hodgkin lymphoma, unspecified, lymph nodes of head, face, and neck: Secondary | ICD-10-CM

## 2024-07-22 DIAGNOSIS — Z79810 Long term (current) use of selective estrogen receptor modulators (SERMs): Secondary | ICD-10-CM | POA: Diagnosis not present

## 2024-07-22 DIAGNOSIS — G893 Neoplasm related pain (acute) (chronic): Secondary | ICD-10-CM | POA: Diagnosis not present

## 2024-07-22 DIAGNOSIS — C7951 Secondary malignant neoplasm of bone: Secondary | ICD-10-CM | POA: Diagnosis not present

## 2024-07-22 DIAGNOSIS — Z923 Personal history of irradiation: Secondary | ICD-10-CM | POA: Diagnosis not present

## 2024-07-22 DIAGNOSIS — Z170 Estrogen receptor positive status [ER+]: Secondary | ICD-10-CM | POA: Diagnosis not present

## 2024-07-22 DIAGNOSIS — C7800 Secondary malignant neoplasm of unspecified lung: Secondary | ICD-10-CM | POA: Diagnosis not present

## 2024-07-22 DIAGNOSIS — Z9221 Personal history of antineoplastic chemotherapy: Secondary | ICD-10-CM | POA: Diagnosis not present

## 2024-07-22 DIAGNOSIS — Z1721 Progesterone receptor positive status: Secondary | ICD-10-CM | POA: Diagnosis not present

## 2024-07-22 DIAGNOSIS — Z1732 Human epidermal growth factor receptor 2 negative status: Secondary | ICD-10-CM | POA: Diagnosis not present

## 2024-07-22 DIAGNOSIS — Z9012 Acquired absence of left breast and nipple: Secondary | ICD-10-CM | POA: Diagnosis not present

## 2024-07-22 DIAGNOSIS — Z79899 Other long term (current) drug therapy: Secondary | ICD-10-CM | POA: Diagnosis not present

## 2024-07-22 DIAGNOSIS — C770 Secondary and unspecified malignant neoplasm of lymph nodes of head, face and neck: Secondary | ICD-10-CM | POA: Diagnosis present

## 2024-07-22 MED ORDER — ONDANSETRON HCL 8 MG PO TABS: 8.0000 mg | ORAL_TABLET | Freq: Three times a day (TID) | ORAL | 2 refills | Status: AC | PRN

## 2024-07-22 NOTE — Patient Instructions (Signed)
 [facility] [place] - A DEPT OF [place]  Thank you for choosing [facility] to provide your oncology/hematology care and for allowing us  to participate in your care today!  As a reminder, we spoke about the following today: olaparib  for the treatment of breast cancer. It is planned to continue until disease progression or unacceptable toxicity.  Treatment goal: Control  Medication handout has been provided.   **For oral cancer medication prescription refill requests, contact your pharmacy and they will contact our office if needed. Allow 5-7 days for refills to be completed by your specialty pharmacy.    Cancer Center General Instructions:  If you have an appointment at the [facility], please go directly to the Cancer Center and check in at the registration area.  We strive to give you quality time with your provider. You may need to reschedule your appointment if you arrive late (15 or more minutes).  Arriving late affects you and other patients whose appointments are after yours.  Also, if you miss three or more appointments without notifying the office, you may be dismissed from the clinic at the provider's discretion.      BELOW ARE SYMPTOMS THAT SHOULD BE REPORTED IMMEDIATELY: *FEVER GREATER THAN 100.4 F (38 C) OR HIGHER *CHILLS OR SWEATING *NAUSEA AND VOMITING THAT IS NOT CONTROLLED WITH YOUR NAUSEA MEDICATION *UNUSUAL SHORTNESS OF BREATH *UNUSUAL BRUISING OR BLEEDING *URINARY PROBLEMS (pain or burning when urinating, or frequent urination) *BOWEL PROBLEMS (unusual diarrhea, constipation, pain near the anus) TENDERNESS IN MOUTH AND THROAT WITH OR WITHOUT PRESENCE OF ULCERS (sore throat, sores in mouth, or a toothache) UNUSUAL RASH, SWELLING OR PAIN  UNUSUAL VAGINAL DISCHARGE OR ITCHING   Items with * indicate a potential emergency and should be followed up as soon as possible or go to the Emergency Department if any problems should  occur.  Please show the CHEMOTHERAPY ALERT CARD at check-in to the Emergency Department and triage nurse.  Should you have questions after your visit or need to cancel or reschedule your appointment, please contact [facility] [place] - A DEPT OF [facility]  [phone] and follow the prompts.  Office hours are 8:00 a.m. to 4:30 p.m. Monday - Friday. Please note that voicemails left after 4:00 p.m. may not be returned until the following business day.  We are closed weekends and major holidays. You have access to a nurse at all times for urgent questions. Please call the main number to the clinic [phone] and follow the prompts.  For any non-urgent questions, you may also contact your provider using MyChart. We now offer e-Visits for anyone 78 and older to request care online for non-urgent symptoms. For details visit [email or web address].   Also download the MyChart app! Go to the app store, search MyChart, open the app, select [place], and log in with your MyChart username and password.

## 2024-07-28 ENCOUNTER — Other Ambulatory Visit: Payer: Self-pay

## 2024-07-28 DIAGNOSIS — Z170 Estrogen receptor positive status [ER+]: Secondary | ICD-10-CM

## 2024-07-29 ENCOUNTER — Inpatient Hospital Stay: Admitting: Hematology and Oncology

## 2024-07-29 ENCOUNTER — Inpatient Hospital Stay

## 2024-07-29 VITALS — BP 151/77 | HR 88 | Temp 98.1°F | Resp 18 | Ht 75.0 in | Wt 340.3 lb

## 2024-07-29 DIAGNOSIS — C8591 Non-Hodgkin lymphoma, unspecified, lymph nodes of head, face, and neck: Secondary | ICD-10-CM

## 2024-07-29 DIAGNOSIS — C50422 Malignant neoplasm of upper-outer quadrant of left male breast: Secondary | ICD-10-CM

## 2024-07-29 DIAGNOSIS — Z170 Estrogen receptor positive status [ER+]: Secondary | ICD-10-CM | POA: Diagnosis not present

## 2024-07-29 LAB — CBC WITH DIFFERENTIAL (CANCER CENTER ONLY)
Abs Immature Granulocytes: 0.03 K/uL (ref 0.00–0.07)
Basophils Absolute: 0.1 K/uL (ref 0.0–0.1)
Basophils Relative: 1 %
Eosinophils Absolute: 0.2 K/uL (ref 0.0–0.5)
Eosinophils Relative: 2 %
HCT: 41.7 % (ref 39.0–52.0)
Hemoglobin: 14.6 g/dL (ref 13.0–17.0)
Immature Granulocytes: 0 %
Lymphocytes Relative: 35 %
Lymphs Abs: 2.7 K/uL (ref 0.7–4.0)
MCH: 32.8 pg (ref 26.0–34.0)
MCHC: 35 g/dL (ref 30.0–36.0)
MCV: 93.7 fL (ref 80.0–100.0)
Monocytes Absolute: 0.7 K/uL (ref 0.1–1.0)
Monocytes Relative: 10 %
Neutro Abs: 3.9 K/uL (ref 1.7–7.7)
Neutrophils Relative %: 52 %
Platelet Count: 247 K/uL (ref 150–400)
RBC: 4.45 MIL/uL (ref 4.22–5.81)
RDW: 12.2 % (ref 11.5–15.5)
WBC Count: 7.5 K/uL (ref 4.0–10.5)
nRBC: 0 % (ref 0.0–0.2)

## 2024-07-29 LAB — CMP (CANCER CENTER ONLY)
ALT: 39 U/L (ref 0–44)
AST: 40 U/L (ref 15–41)
Albumin: 4.1 g/dL (ref 3.5–5.0)
Alkaline Phosphatase: 77 U/L (ref 38–126)
Anion gap: 12 (ref 5–15)
BUN: 10 mg/dL (ref 6–20)
CO2: 24 mmol/L (ref 22–32)
Calcium: 9.1 mg/dL (ref 8.9–10.3)
Chloride: 101 mmol/L (ref 98–111)
Creatinine: 0.99 mg/dL (ref 0.61–1.24)
GFR, Estimated: >60 mL/min
Glucose, Bld: 121 mg/dL — ABNORMAL HIGH (ref 70–99)
Potassium: 4.1 mmol/L (ref 3.5–5.1)
Sodium: 137 mmol/L (ref 135–145)
Total Bilirubin: 0.3 mg/dL (ref 0.0–1.2)
Total Protein: 7.1 g/dL (ref 6.5–8.1)

## 2024-07-29 MED ORDER — OXYCODONE-ACETAMINOPHEN 5-325 MG PO TABS: 1.0000 | ORAL_TABLET | Freq: Three times a day (TID) | ORAL | 0 refills | Status: AC | PRN

## 2024-07-29 NOTE — Assessment & Plan Note (Signed)
 09/15/2021:Palpable left breast mass x8 months. CT 08/27/2021 in ED showed 3.8 cm left breast mass with left axillary lymph nodes.Mamm and US : 4 cm mass with 5 axillary lymph nodes: Biopsy: Grade 2 IDC, lymph node: Positive, ER 100%, PR 50%, Ki-67 25%, HER2 negative ratio 1.25, copy #3.45 (Recent history of coronary artery disease EF 22% and stroke)   Treatment summary: 1. Neoadjuvant chemotherapy with Taxotere  and Cytoxan  every 3 weeks x6 cycles completed 01/16/2022 2. left mastectomy: Residual grade 2 IDC 3.5 cm, margins negative, 2/2 lymph nodes positive, angiolymphatic invasion present, ER 100%, PR 50%, HER2 negative, Ki-67 25% 3. Followed by adjuvant radiation therapy 04/03/22-05/16/22 4.  Followed by adjuvant antiestrogen therapy with tamoxifen  started November 2023 5. Signatera test positive (0.3)  6. 06/26/2023: Bone scan: Negative, CT CAP: Enlarged left supraclavicular lymph node 2.1 cm, (used to be 0.8 cm) new tiny scattered nonspecific lung nodules (4 mm and 3 mm) 7. 07/16/2023: Biopsy left supraclavicular lymph node: Metastatic poorly differentiated carcinoma consistent with breast ER 75%, PR 75%, Ki67 40%, HER2 1+ 8. 08/23/2023: Left neck dissection: 3/19 lymph nodes positive consistent with breast primary, extranodal extension on 3 lymph nodes, lymphovascular invasion identified 9. Tamoxifen  with Verzinio (100 p.o. twice daily) discontinued 07/15/2024 10.  06/08/2024: CT CAP: Pulmonary nodules 11.  Clavicular mass: Biopsy: Metastatic adenocarcinoma consistent with breast primary positive for ER/PR. 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 

## 2024-07-29 NOTE — Progress Notes (Signed)
 "  Patient Care Team: Pcp, No as PCP - General Tyree Nanetta SAILOR, RN as Oncology Nurse Navigator Dina Camie FORBES DEVONNA (Neurology) Odean Potts, MD as Medical Oncologist (Hematology and Oncology)  DIAGNOSIS:  Encounter Diagnosis  Name Primary?   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 CT 08/27/2021 in ED showed 3.8 centimeter left breast mass with left axillary lymph nodes.Mamm and US : 4 cm mass with 5 axillary lymph nodes: Biopsy: Grade 2 IDC, lymph node: Positive, ER 100%, PR 50%, Ki-67 25%, HER2 negative ratio 1.25, copy #3.45   09/25/2021 Cancer Staging   Staging form: Breast, AJCC 8th Edition - Clinical: Stage IIA (cT2, cN1, cM0, G2, ER+, PR+, HER2-) - Signed by Odean Potts, MD on 09/25/2021 Histologic grading system: 3 grade system   10/03/2021 - 01/18/2022 Chemotherapy   Patient is on Treatment Plan : BREAST TC q21d     01/22/2022 Genetic Testing   Negative genetic testing on the CancerNext-Expanded+RNAinsight.  The report date is January 22, 2022.  The CancerNext-Expanded gene panel offered by [facility] and includes sequencing and rearrangement analysis for the following 77 genes: AIP, ALK, APC*, ATM*, AXIN2, BAP1, BARD1, BLM, BMPR1A, BRCA1*, BRCA2*, BRIP1*, CDC73, CDH1*, CDK4, CDKN1B, CDKN2A, CHEK2*, CTNNA1, DICER1, FANCC, FH, FLCN, GALNT12, KIF1B, LZTR1, MAX, MEN1, MET, MLH1*, MSH2*, MSH3, MSH6*, MUTYH*, NBN, NF1*, NF2, NTHL1, PALB2*, PHOX2B, PMS2*, POT1, PRKAR1A, PTCH1, PTEN*, RAD51C*, RAD51D*, RB1, RECQL, RET, SDHA, SDHAF2, SDHB, SDHC, SDHD, SMAD4, SMARCA4, SMARCB1, SMARCE1, STK11, SUFU, TMEM127, TP53*, TSC1, TSC2, VHL and XRCC2 (sequencing and deletion/duplication); EGFR, EGLN1, HOXB13, KIT,  MITF, PDGFRA, POLD1, and POLE (sequencing only); EPCAM and GREM1 (deletion/duplication only). DNA and RNA analyses performed for * genes.      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  He has chronic severe left chest wall pain described as fire and pressure that markedly limits mobility and has contributed to weight gain. His current opioid regimen provides only partial and short-lived relief. He takes two doses daily and occasionally two tablets at a time. He is worried about opioid dependence due to a family history of addiction and does not want dose escalation. He is open to non-pharmacologic options and received information on StemWave therapy.  He is scheduled to start a new systemic therapy on Friday with labs planned two weeks later. He has tolerated prior cancer treatments without significant adverse effects. Recent labs show normal renal and hepatic function, with A1c pending. Pathology from his most recent resection showed negative margins except for involvement of the deep skeletal muscle margin in a chest wall region that has already received radiation.  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 He has adjusted training for weather and safety. He reports no new symptoms or acute changes today.     ALLERGIES:  is allergic to bactrim [sulfamethoxazole-trimethoprim] and ovace plus wash [sulfacetamide sodium].  MEDICATIONS:  Current Outpatient Medications  Medication Sig Dispense Refill   acetaminophen  (TYLENOL ) 500 MG tablet  Take 1,000 mg by mouth 2 (two) times daily as needed for headache or fever (pain).      atorvastatin  (LIPITOR) 20 MG tablet TAKE 1 TABLET BY MOUTH EVERY DAY 15 tablet 0   dapagliflozin  propanediol (FARXIGA ) 10 MG TABS tablet Take 1 tablet (10 mg total) by mouth daily before breakfast. 90 tablet 3   diphenhydramine -acetaminophen  (TYLENOL  PM) 25-500 MG TABS tablet Take 3 tablets by mouth at bedtime.     eplerenone  (INSPRA ) 50 MG tablet Take 1 tablet by mouth daily. 90 tablet 3   metoprolol  succinate (TOPROL -XL) 50 MG 24 hr tablet Take 1.5 tablets (75 mg total) by mouth daily. 135 tablet 3   olaparib  (LYNPARZA ) 150 MG tablet Take 2 tablets (300 mg total) by mouth 2 (two) times daily. Swallow whole. May take with food to decrease nausea and vomiting. 120 tablet 3   ondansetron  (ZOFRAN ) 8 MG tablet Take 1 tablet (8 mg total) by mouth every 8 (eight) hours as needed for nausea or vomiting. 30 tablet 2   oxyCODONE  (OXY IR/ROXICODONE ) 5 MG immediate release tablet Take 1 tablet (5 mg total) by mouth every 6 (six) hours as needed for severe pain (pain score 7-10). 15 tablet 0   sacubitril -valsartan  (ENTRESTO ) 49-51 MG Take 1 tablet by mouth 2 (two) times daily. 60 tablet 2   oxyCODONE -acetaminophen  (PERCOCET/ROXICET) 5-325 MG tablet Take 1 tablet by mouth every 8 (eight) hours as needed for severe pain (pain score 7-10). 60 tablet 0   No current facility-administered medications for this visit.    PHYSICAL EXAMINATION: ECOG PERFORMANCE STATUS: 1 - Symptomatic but completely ambulatory  Vitals:   07/29/24 1531  BP: (!) 151/77  Pulse: 88  Resp: 18  Temp: 98.1 F (36.7 C)  SpO2: 98%   Filed Weights   07/29/24 1531  Weight: (!) 340 lb 4.8 oz (154.4 kg)     LABORATORY DATA:  I have reviewed the data as listed    Latest Ref Rng & Units 07/29/2024    2:48 PM 07/16/2024   11:31 AM 06/02/2024    9:46 AM  CMP  Glucose 70 - 99 mg/dL 878  888  847   BUN 6 - 20 mg/dL 10  11  11    Creatinine 0.61 - 1.24 mg/dL 9.00  9.08  8.80   Sodium 135 - 145 mmol/L 137  134  139   Potassium 3.5 -  5.1 mmol/L 4.1  4.6  4.2   Chloride 98 - 111 mmol/L 101  102  104   CO2 22 - 32 mmol/L 24  19  26    Calcium  8.9 - 10.3 mg/dL 9.1  9.1  9.5   Total Protein 6.5 - 8.1 g/dL 7.1   7.1   Total Bilirubin 0.0 - 1.2 mg/dL 0.3   0.4   Alkaline Phos 38 - 126 U/L 77   73   AST 15 - 41 U/L 40   38   ALT 0 - 44 U/L 39   44     Lab Results  Component Value Date   WBC 7.5 07/29/2024   HGB 14.6 07/29/2024   HCT 41.7 07/29/2024   MCV 93.7 07/29/2024   PLT 247 07/29/2024   NEUTROABS 3.9 07/29/2024    ASSESSMENT & PLAN:  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 CT 08/27/2021 in ED showed 3.8 cm left breast mass with left axillary lymph nodes.Mamm and US : 4 cm  mass with 5 axillary lymph nodes: Biopsy: Grade 2 IDC, lymph node: Positive, ER 100%, PR 50%, Ki-67 25%, HER2 negative ratio 1.25, copy #3.45 (Recent history of coronary artery disease EF 22% and stroke)   Treatment summary: 1. Neoadjuvant chemotherapy with Taxotere  and Cytoxan  every 3 weeks x6 cycles completed 01/16/2022 2. left mastectomy: Residual grade 2 IDC 3.5 cm, margins negative, 2/2 lymph nodes positive, angiolymphatic invasion present, ER 100%, PR 50%, HER2 negative, Ki-67 25% 3. Followed by adjuvant radiation therapy 04/03/22-05/16/22 4.  Followed by adjuvant antiestrogen therapy with tamoxifen  started November 2023 5. Signatera test positive (0.3)  6. 06/26/2023: Bone scan: Negative, CT CAP: Enlarged left supraclavicular lymph node 2.1 cm, (used to be 0.8 cm) new tiny scattered nonspecific lung nodules (4 mm and 3 mm) 7. 07/16/2023: Biopsy left supraclavicular lymph node: Metastatic poorly differentiated carcinoma consistent with breast ER 75%, PR 75%, Ki67 40%, HER2 1+ 8. 08/23/2023: Left neck dissection: 3/19 lymph nodes positive consistent with breast primary, extranodal extension on 3 lymph nodes, lymphovascular invasion  identified 9. Tamoxifen  with Verzinio (100 p.o. twice daily) discontinued 07/15/2024 10.  06/08/2024: CT CAP: Pulmonary nodules 11.  Clavicular mass: Biopsy: Metastatic adenocarcinoma consistent with breast primary positive for ER/PR. [facility] molecular testing: HER2 low, ER +90%, PR +20%, BRCA2 mutation, PIK 3 CA mutation 12.  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  I renew prescription with more tablets because his pain medication does not appear to last the whole time.  Follow-up in 2 weeks for labs I sent a message to radiation oncology for chest wall radiation  ------------------------------------- Assessment and Plan Assessment & Plan Estrogen receptor positive invasive ductal carcinoma of the upper-outer quadrant of the left male breast Surgical pathology showed deep skeletal muscle margin involvement, not resectable. Previous radiation therapy limits options. Pending A1c. Anticipated good tolerance to treatment. Deep margin involvement complicates local control. - Referred to radiation oncology for evaluation of additional radiation therapy. - Coordinated follow-up with radiation oncology for local control options. - Scheduled blood work two weeks after new therapy initiation. - Arranged follow-up visits: two weeks post-therapy initiation, then monthly. - Monitored pending A1c result.  Neoplasm-related pain Significant pain with burning and pressure, impairing mobility and quality of life. Current analgesics insufficient. Concerns about opioid dependence due to family history. Open to non-pharmacologic interventions. - Prescribed stronger analgesic regimen, two tablets daily. - Referred for StemWave therapy at physical therapy, ten sessions  recommended. - Discussed opioid dependence risk, emphasized cautious use.      No orders of the defined types were placed in this encounter.  The patient has a good understanding of the overall plan. he agrees with it. he will call with any problems that may develop before the next visit here.  I personally spent a total of 30 minutes in the care of the patient today including preparing to see the patient, getting/reviewing separately obtained history, performing a medically appropriate exam/evaluation, counseling and educating, placing orders, referring and communicating with other health care professionals, documenting clinical information in the EHR, independently interpreting results, communicating results, and coordinating care.   Viinay K Mayana Irigoyen, MD 07/29/24    "

## 2024-07-30 LAB — HEMOGLOBIN A1C
Hgb A1c MFr Bld: 6.7 % — ABNORMAL HIGH (ref 4.8–5.6)
Mean Plasma Glucose: 145.59 mg/dL

## 2024-08-04 NOTE — Progress Notes (Signed)
 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 [facility] molecular testing: HER2 low, ER +90%, PR +20%, BRCA2 mutation, PIK 3 CA mutation   Histology per Pathology Report:     Did patient present with symptoms (if so, please note symptoms) or was this found on screening mammography?  He originally had a palpable left breast mass that was confirmed with CT 08/27/21, currently having chronic severe left chest wall pain described as fire and pressure that markedly limits mobility   Past/Anticipated interventions by surgeon, if any: LEFT SIMPLE MASTECTOMY WITH LEFT TARGETED LYMPH NODE BIOPSY (Left: Chest)  LEFT SENTINEL LYMPH NODE MAPPING (Left: Chest)   Past/Anticipated interventions by medical oncology, if any:  Somatic BRCA2 mutation: Patient's insurance is not approving Talazoparib.  I discussed with the patient different options and decided to see if they would approve olaparib .  I also reached out to genetics to see if germline genetic testing will need to be repeated to verify the BRCA mutation status. Alternatively I discussed with the patient about using [facility] (Dr. Odean)  Lymphedema issues, if any:  {:18581} {t:21944}   Pain issues, if any:  {:18581} {PAIN DESCRIPTION:21022940}  Skin issues if any:   SAFETY ISSUES: Prior radiation? yes Pacemaker/ICD? {:18581} Possible current pregnancy? No, male Is the patient on methotrexate? no  Current Complaints / other details:  ***    Dyke JULIANNA Frost, LPN 8/72/7973,89:70 AM

## 2024-08-05 ENCOUNTER — Ambulatory Visit
Admission: RE | Admit: 2024-08-05 | Discharge: 2024-08-05 | Disposition: A | Discharge status: Home / Self Care | Source: Ambulatory Visit | Attending: Radiation Oncology | Admitting: Radiation Oncology

## 2024-08-05 VITALS — BP 149/87 | HR 88 | Temp 98.7°F | Resp 19 | Ht 75.0 in | Wt 338.0 lb

## 2024-08-05 DIAGNOSIS — I509 Heart failure, unspecified: Secondary | ICD-10-CM | POA: Insufficient documentation

## 2024-08-05 DIAGNOSIS — Z170 Estrogen receptor positive status [ER+]: Secondary | ICD-10-CM

## 2024-08-05 DIAGNOSIS — C50912 Malignant neoplasm of unspecified site of left female breast: Secondary | ICD-10-CM

## 2024-08-05 DIAGNOSIS — Z9221 Personal history of antineoplastic chemotherapy: Secondary | ICD-10-CM | POA: Insufficient documentation

## 2024-08-05 DIAGNOSIS — Z8673 Personal history of transient ischemic attack (TIA), and cerebral infarction without residual deficits: Secondary | ICD-10-CM | POA: Insufficient documentation

## 2024-08-05 DIAGNOSIS — Z79899 Other long term (current) drug therapy: Secondary | ICD-10-CM | POA: Insufficient documentation

## 2024-08-05 DIAGNOSIS — Z87891 Personal history of nicotine dependence: Secondary | ICD-10-CM | POA: Insufficient documentation

## 2024-08-05 DIAGNOSIS — C770 Secondary and unspecified malignant neoplasm of lymph nodes of head, face and neck: Secondary | ICD-10-CM | POA: Insufficient documentation

## 2024-08-05 DIAGNOSIS — Z7984 Long term (current) use of oral hypoglycemic drugs: Secondary | ICD-10-CM | POA: Insufficient documentation

## 2024-08-05 DIAGNOSIS — I252 Old myocardial infarction: Secondary | ICD-10-CM | POA: Insufficient documentation

## 2024-08-05 DIAGNOSIS — Z79810 Long term (current) use of selective estrogen receptor modulators (SERMs): Secondary | ICD-10-CM | POA: Insufficient documentation

## 2024-08-05 DIAGNOSIS — C792 Secondary malignant neoplasm of skin: Secondary | ICD-10-CM | POA: Insufficient documentation

## 2024-08-05 DIAGNOSIS — C50422 Malignant neoplasm of upper-outer quadrant of left male breast: Secondary | ICD-10-CM | POA: Insufficient documentation

## 2024-08-05 DIAGNOSIS — C50922 Malignant neoplasm of unspecified site of left male breast: Secondary | ICD-10-CM | POA: Insufficient documentation

## 2024-08-05 DIAGNOSIS — I890 Lymphedema, not elsewhere classified: Secondary | ICD-10-CM | POA: Insufficient documentation

## 2024-08-05 DIAGNOSIS — Z923 Personal history of irradiation: Secondary | ICD-10-CM | POA: Insufficient documentation

## 2024-08-05 DIAGNOSIS — Z17411 Hormone receptor positive with human epidermal growth factor receptor 2 negative status: Secondary | ICD-10-CM | POA: Insufficient documentation

## 2024-08-05 MED ORDER — SONAFINE EX EMUL
1.0000 | Freq: Once | CUTANEOUS | Status: AC
  Administered 2024-08-05: 1 via TOPICAL

## 2024-08-12 NOTE — Therapy (Incomplete)
 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)    Myocarditis (HCC) 08/2021   Pneumonia    Stroke (HCC) 09/01/2021   in setting of LV thrombus   Past Surgical History:  Procedure Laterality Date   IR IMAGING GUIDED PORT INSERTION  09/28/2021   LESION REMOVAL Left 07/16/2024   Procedure: EXCISION, LESION, TORSO;  Surgeon: Vanderbilt Ned, MD;  Location: [facility] OR;  Service: General;  Laterality: Left;  excision left chest skin lesion   MASTECTOMY W/ SENTINEL NODE BIOPSY Left 02/21/2022   Procedure: LEFT SIMPLE MASTECTOMY WITH LEFT TARGETED LYMPH NODE BIOPSY;  Surgeon: Vanderbilt Ned, MD;  Location: [facility] OR;  Service: General;  Laterality: Left;  GEN w/ PEC BLOCK   PORT-A-CATH REMOVAL Right 02/21/2022   Procedure: PORT REMOVAL;  Surgeon: Vanderbilt Ned, MD;  Location: [facility] OR;  Service: General;  Laterality: Right;   RADICAL NECK DISSECTION Left 08/23/2023   Procedure: RADICAL NECK DISSECTION;  Surgeon: Jesus Oliphant, MD;  Location: [facility] OR;  Service: ENT;  Laterality: Left;   RIGHT/LEFT HEART CATH AND CORONARY ANGIOGRAPHY N/A 08/28/2021   Procedure: RIGHT/LEFT HEART CATH AND CORONARY ANGIOGRAPHY;  Surgeon: Elmira Newman PARAS, MD;  Location: [facility] INVASIVE CV LAB;  Service: Cardiovascular;  Laterality: N/A;   Patient Active Problem List   Diagnosis Date Noted   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) 08/23/2023   Sepsis due to cellulitis (HCC) 06/13/2023   Preseptal cellulitis of left eye 06/13/2023   Breast cancer, stage 3, left (HCC) 02/21/2022   Genetic testing 01/22/2022   LV (left ventricular)  mural thrombus 12/13/2021   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, estrogen receptor positive (HCC) 12/13/2021   Port-A-Cath in place 10/03/2021   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  Rationale for Evaluation and Treatment: Rehabilitation  ONSET DATE: 09/15/21  SUBJECTIVE:  SUBJECTIVE STATEMENT:   PERTINENT HISTORY:  08/27/2021: ER/PR positive HER2 negative invasive ductal carcinoma grade 2. Ki67 25%. Completed chemotherapy. Left mastectomy on 02/21/22 with 4/5 positive lymph nodes. Completed radiation 05/16/22.  08/23/23: Lt neck dissection due to positive supraclavicular lymph node.  3/19 nodes positive.  07/16/24: Lt Chest wall excision: Recurrent breast cancer attached to skeletal muscle and deep margin of the specimen consistent with breast primary  Will have additional radiation  PATIENT GOALS:  Reassess how my recovery is going related to arm function, pain, and swelling.  PAIN:  Are you having pain?    PRECAUTIONS: Recent Surgery, left UE Lymphedema  ACTIVITY LEVEL / LEISURE:     OBJECTIVE:  PATIENT SURVEYS:  QUICK DASH: not tested   OBSERVATIONS: Left chest previous mastectomy and radiation, Left arm larger.   POSTURE:  WNL  LYMPHEDEMA ASSESSMENT:   UPPER EXTREMITY AROM/PROM:  WNL    LYMPHEDEMA ASSESSMENTS:    LANDMARK RIGHT  10/02/2021 08/30/22  15cm proximal   38  10 cm proximal to olecranon process 34.6 36.5  Olecranon process 29.6 31.5  15cm proximal  32.2  10 cm proximal to ulnar styloid process 26.5 27.2  Just proximal to ulnar styloid process 18.5 19  Across hand at thumb web space 24.7 24  At base of 2nd digit 7 7.6  (Blank rows = not tested)    [facility] LEFT  10/02/2021 08/30/22  Axilla   46.2  15cm proximal   44.8  10 cm proximal to olecranon process 35.7 43.5  Olecranon process 30.9 36  15cm proximal  34.8  10 cm proximal to ulnar styloid process 25.5 30.5  Just proximal to ulnar styloid process 17.9 20.4  Across hand at thumb web space 24 25.9  At base of 2nd digit 7 7.8  (Blank rows = not tested)  Left:  Rt:    LDEX: 48.2 on 08/30/22  TODAYS TREATMENT: 08/30/22: Eval performed.  Due to pt flying next week we applied a compression bandage as follows: juzo liner, artiflex hand to axilla, 1 6cm hand bandage, 1 8cm herringbone forearm, 1 8cm from distal forearm to axilla and, 1 10cm from wrist to axilla with education on when to remove and to bring all items back on Monday.   Pt reports he feels like he can do this himself and has experience from wrapping for boxing so he was given a link to [facility] video but did not get to practice in clinic.    PATIENT EDUCATION:  Education details: POC, bandaging principles, bandaging briefly and link to [employer] [facility] self bandaging video Person educated: Patient Education method: Demo, vc Education comprehension: needs more education  HOME EXERCISE PROGRAM:   ASSESSMENT:  CLINICAL  IMPRESSION: Pt returns with new onset of lymphedema in the Lt UE after [place].  He has been self treating with a circular knit stocking at night so he was educated on not doing this.  His volume difference between arms is and almost 4-5 cm globally.  He flies again next Tuesday so we decided to apply the bandage today with brief education and then he will remove on Saturday and bring things back for a visit on Monday to learn self bandaging / be bandaged for his Tuesday morning trip.  We will then begin CDT after he returns in a few days.    Pt will benefit from skilled therapeutic intervention to improve on the following deficits: Decreased knowledge of precautions, impaired UE functional use,  pain, decreased ROM, postural dysfunction.   PT treatment/interventions: ADL/Self care home management, Patient/Family education, Manual therapy, re-evaluation, therapeutic exercise.    GOALS: Goals reviewed with patient? Yes  LONG TERM GOALS:  (STG=LTG)  GOALS Name Target Date  Goal status  1 Pt be ind with self bandaging for the UE 10/04/22 INITIAL  2 Pt will be ind with MLD or compression pump for edema management 10/04/22 INITIAL  3 Pt will obtain maximum reduction to obtain day and night compression 10/04/22 INITIAL          PLAN:  PT FREQUENCY/DURATION: 2xper week x 5 weeks (pt feels like he can manage bandaging with some instruction)  PLAN FOR NEXT SESSION: focus on bandaging education and performance for flight the next day. (Has [facility] self bandaging link) He reported upon leaving that he didn't like the cotton so he may do better with rosidal/foam.  CDT Lt UE: Consider pump.   Sending demographics to [facility] 08/30/22    Larue Saddie SAUNDERS, PT 08/12/2024, 10:18 PM "

## 2024-08-12 NOTE — Addendum Note (Signed)
 Encounter addended by: Lanell Donald Stagger, PA-C on: 08/12/2024 12:53 PM  Actions taken: Clinical Note Signed

## 2024-08-12 NOTE — Progress Notes (Signed)
 Addendum: The patient's initial surgical pathology results were reviewed when he was seen on 03/08/22 at that time his pathology results showed 2/2 nodes positive. He was seen for postoperative evaluation on 03/09/22 and his pathology had not changed, however there was an addendum made by pathology on 03/14/22 that 3 additional nodes were recovered, in total after the addendum, the patient had 4 of 5 sampled nodes that were positive for disease. Additional axillary surgery was not recommended, but our department does not see communication from that time clarifying the updates to the surgical pathology.

## 2024-08-13 ENCOUNTER — Ambulatory Visit
Admission: RE | Admit: 2024-08-13 | Discharge: 2024-08-13 | Disposition: A | Source: Ambulatory Visit | Attending: Radiation Oncology | Admitting: Radiation Oncology

## 2024-08-13 ENCOUNTER — Ambulatory Visit: Admitting: Rehabilitation

## 2024-08-13 ENCOUNTER — Other Ambulatory Visit: Payer: Self-pay | Admitting: Radiation Oncology

## 2024-08-13 DIAGNOSIS — R112 Nausea with vomiting, unspecified: Secondary | ICD-10-CM

## 2024-08-13 DIAGNOSIS — C50422 Malignant neoplasm of upper-outer quadrant of left male breast: Secondary | ICD-10-CM

## 2024-08-13 DIAGNOSIS — C770 Secondary and unspecified malignant neoplasm of lymph nodes of head, face and neck: Secondary | ICD-10-CM

## 2024-08-13 NOTE — Progress Notes (Addendum)
 I saw the patient today at simulation, Dr. Vanderbilt noted a cellulitis of his skin at his postoperative check on 08/07/24 and recommended antibiotics which he is taking. Dr. Vanderbilt recommended we evaluate the site again when he came for simulation. The patient states he is doing okay. He is having trouble with nausea and several episodes of emesis in the mornings after taking Lynparza . He's been taking this for about a week. He denies any headaches, changes in speech or movement. He was not having nausea until starting this medication. He is taking Zofran  now when taking Lynparza  which seems to be helping.   We discussed the consideration of an MRI of the brain to rule out concerns for disease. He is open to this, but understandably is fearful of the risk of developing brain disease as he has a coworker who's wife passed from metastatic breast cancer involving the brain. He is open to this and will continue his antiemetics. I will discuss his case with Dr. Odean and Dr. Dewey to see how they feel about an MRI. His skin appears to be healing well with more separation noted (today about 4mm) than during our last visit. We will plan to simulate today but if his incision has not healed completely before his scheduled xrt on 08/25/24 we would delay this. He is in agreement to keep us  aware of the progress. Below is the photo of the site today.       Donald KYM Husband, PAC

## 2024-08-14 ENCOUNTER — Telehealth: Payer: Self-pay | Admitting: *Deleted

## 2024-08-14 ENCOUNTER — Other Ambulatory Visit: Payer: Self-pay | Admitting: Radiation Oncology

## 2024-08-14 DIAGNOSIS — C770 Secondary and unspecified malignant neoplasm of lymph nodes of head, face and neck: Secondary | ICD-10-CM

## 2024-08-14 DIAGNOSIS — Z170 Estrogen receptor positive status [ER+]: Secondary | ICD-10-CM

## 2024-08-14 NOTE — Progress Notes (Signed)
 Dr. Gudena is in agreement we should evaluate the patients new onset of symptoms with MRI brain,   orders placed

## 2024-08-14 NOTE — Telephone Encounter (Signed)
 Called patient to inform of MRI for 08-15-24- arrival time- 2:30 pm, no restrictions to scan,patient to check in @ ED, spoke with patient and he is aware of this scan and the instructions

## 2024-08-15 ENCOUNTER — Ambulatory Visit (HOSPITAL_COMMUNITY)

## 2024-08-20 ENCOUNTER — Ambulatory Visit

## 2024-08-25 ENCOUNTER — Ambulatory Visit: Admitting: Radiation Oncology

## 2024-08-26 ENCOUNTER — Ambulatory Visit: Admitting: Radiation Oncology

## 2024-08-26 ENCOUNTER — Inpatient Hospital Stay: Admitting: Pharmacist

## 2024-08-26 ENCOUNTER — Ambulatory Visit

## 2024-08-26 ENCOUNTER — Inpatient Hospital Stay: Attending: Hematology and Oncology

## 2024-08-27 ENCOUNTER — Ambulatory Visit

## 2024-08-28 ENCOUNTER — Ambulatory Visit

## 2024-08-31 ENCOUNTER — Ambulatory Visit

## 2024-09-01 ENCOUNTER — Ambulatory Visit

## 2024-09-02 ENCOUNTER — Ambulatory Visit

## 2024-09-03 ENCOUNTER — Ambulatory Visit

## 2024-09-04 ENCOUNTER — Ambulatory Visit

## 2024-09-07 ENCOUNTER — Ambulatory Visit

## 2024-09-08 ENCOUNTER — Ambulatory Visit

## 2024-09-09 ENCOUNTER — Ambulatory Visit

## 2024-09-10 ENCOUNTER — Ambulatory Visit

## 2024-09-11 ENCOUNTER — Ambulatory Visit

## 2024-09-14 ENCOUNTER — Ambulatory Visit

## 2024-09-15 ENCOUNTER — Ambulatory Visit

## 2024-09-16 ENCOUNTER — Ambulatory Visit

## 2024-09-17 ENCOUNTER — Ambulatory Visit

## 2024-09-18 ENCOUNTER — Ambulatory Visit

## 2024-09-21 ENCOUNTER — Ambulatory Visit

## 2024-09-30 ENCOUNTER — Inpatient Hospital Stay: Admitting: Hematology and Oncology
